# Patient Record
Sex: Male | Born: 1960 | Race: Black or African American | Hispanic: No | Smoking: Never smoker
Health system: Southern US, Community
[De-identification: ages and names within clinical notes are randomized; demographics above are authoritative.]

## PROBLEM LIST (undated history)

## (undated) DIAGNOSIS — R351 Nocturia: Secondary | ICD-10-CM

## (undated) DIAGNOSIS — N183 Chronic kidney disease, stage 3 unspecified: Secondary | ICD-10-CM

## (undated) DIAGNOSIS — Z8719 Personal history of other diseases of the digestive system: Secondary | ICD-10-CM

## (undated) DIAGNOSIS — M1990 Unspecified osteoarthritis, unspecified site: Secondary | ICD-10-CM

## (undated) DIAGNOSIS — M109 Gout, unspecified: Secondary | ICD-10-CM

## (undated) DIAGNOSIS — G4733 Obstructive sleep apnea (adult) (pediatric): Secondary | ICD-10-CM

## (undated) DIAGNOSIS — Z973 Presence of spectacles and contact lenses: Secondary | ICD-10-CM

## (undated) DIAGNOSIS — C801 Malignant (primary) neoplasm, unspecified: Secondary | ICD-10-CM

## (undated) DIAGNOSIS — E785 Hyperlipidemia, unspecified: Secondary | ICD-10-CM

## (undated) DIAGNOSIS — R3129 Other microscopic hematuria: Secondary | ICD-10-CM

## (undated) DIAGNOSIS — R972 Elevated prostate specific antigen [PSA]: Secondary | ICD-10-CM

## (undated) DIAGNOSIS — Z992 Dependence on renal dialysis: Secondary | ICD-10-CM

## (undated) DIAGNOSIS — I471 Supraventricular tachycardia, unspecified: Secondary | ICD-10-CM

## (undated) DIAGNOSIS — D649 Anemia, unspecified: Secondary | ICD-10-CM

## (undated) DIAGNOSIS — N400 Enlarged prostate without lower urinary tract symptoms: Secondary | ICD-10-CM

## (undated) DIAGNOSIS — R7303 Prediabetes: Secondary | ICD-10-CM

## (undated) DIAGNOSIS — E119 Type 2 diabetes mellitus without complications: Secondary | ICD-10-CM

## (undated) DIAGNOSIS — I10 Essential (primary) hypertension: Secondary | ICD-10-CM

## (undated) HISTORY — DX: Essential (primary) hypertension: I10

## (undated) HISTORY — PX: PROSTATE SURGERY: SHX751

## (undated) HISTORY — DX: Hyperlipidemia, unspecified: E78.5

## (undated) HISTORY — PX: OTHER SURGICAL HISTORY: SHX169

## (undated) HISTORY — PX: CARDIAC CATHETERIZATION: SHX172

---

## 1997-11-10 ENCOUNTER — Other Ambulatory Visit: Admission: RE | Admit: 1997-11-10 | Discharge: 1997-11-10 | Payer: Self-pay | Admitting: Orthopedic Surgery

## 2000-08-14 ENCOUNTER — Other Ambulatory Visit: Admission: RE | Admit: 2000-08-14 | Discharge: 2000-08-14 | Payer: Self-pay | Admitting: Obstetrics and Gynecology

## 2001-05-20 ENCOUNTER — Emergency Department (HOSPITAL_COMMUNITY): Admission: EM | Admit: 2001-05-20 | Discharge: 2001-05-20 | Payer: Self-pay | Admitting: *Deleted

## 2001-08-20 ENCOUNTER — Ambulatory Visit (HOSPITAL_COMMUNITY): Admission: RE | Admit: 2001-08-20 | Discharge: 2001-08-20 | Payer: Self-pay | Admitting: Gastroenterology

## 2002-03-04 ENCOUNTER — Encounter (INDEPENDENT_AMBULATORY_CARE_PROVIDER_SITE_OTHER): Payer: Self-pay | Admitting: *Deleted

## 2002-03-04 ENCOUNTER — Ambulatory Visit (HOSPITAL_COMMUNITY): Admission: RE | Admit: 2002-03-04 | Discharge: 2002-03-04 | Payer: Self-pay | Admitting: Gastroenterology

## 2002-04-04 ENCOUNTER — Encounter: Payer: Self-pay | Admitting: Emergency Medicine

## 2002-04-04 ENCOUNTER — Inpatient Hospital Stay (HOSPITAL_COMMUNITY): Admission: EM | Admit: 2002-04-04 | Discharge: 2002-04-08 | Payer: Self-pay | Admitting: Emergency Medicine

## 2002-08-11 ENCOUNTER — Encounter: Payer: Self-pay | Admitting: Internal Medicine

## 2002-08-11 ENCOUNTER — Observation Stay (HOSPITAL_COMMUNITY): Admission: EM | Admit: 2002-08-11 | Discharge: 2002-08-12 | Payer: Self-pay | Admitting: Emergency Medicine

## 2002-08-15 ENCOUNTER — Emergency Department (HOSPITAL_COMMUNITY): Admission: EM | Admit: 2002-08-15 | Discharge: 2002-08-15 | Payer: Self-pay | Admitting: Emergency Medicine

## 2002-08-16 ENCOUNTER — Encounter: Payer: Self-pay | Admitting: Emergency Medicine

## 2002-08-25 ENCOUNTER — Emergency Department (HOSPITAL_COMMUNITY): Admission: EM | Admit: 2002-08-25 | Discharge: 2002-08-26 | Payer: Self-pay | Admitting: Emergency Medicine

## 2003-11-15 ENCOUNTER — Emergency Department (HOSPITAL_COMMUNITY): Admission: EM | Admit: 2003-11-15 | Discharge: 2003-11-15 | Payer: Self-pay | Admitting: Emergency Medicine

## 2003-11-17 ENCOUNTER — Emergency Department (HOSPITAL_COMMUNITY): Admission: EM | Admit: 2003-11-17 | Discharge: 2003-11-17 | Payer: Self-pay | Admitting: *Deleted

## 2004-02-03 ENCOUNTER — Emergency Department (HOSPITAL_COMMUNITY): Admission: EM | Admit: 2004-02-03 | Discharge: 2004-02-04 | Payer: Self-pay | Admitting: Emergency Medicine

## 2004-09-28 ENCOUNTER — Emergency Department (HOSPITAL_COMMUNITY): Admission: EM | Admit: 2004-09-28 | Discharge: 2004-09-28 | Payer: Self-pay | Admitting: Emergency Medicine

## 2004-09-30 ENCOUNTER — Inpatient Hospital Stay (HOSPITAL_COMMUNITY): Admission: EM | Admit: 2004-09-30 | Discharge: 2004-10-05 | Payer: Self-pay | Admitting: Emergency Medicine

## 2005-10-20 ENCOUNTER — Emergency Department (HOSPITAL_COMMUNITY): Admission: EM | Admit: 2005-10-20 | Discharge: 2005-10-20 | Payer: Self-pay | Admitting: Emergency Medicine

## 2006-01-19 ENCOUNTER — Emergency Department (HOSPITAL_COMMUNITY): Admission: EM | Admit: 2006-01-19 | Discharge: 2006-01-19 | Payer: Self-pay | Admitting: Emergency Medicine

## 2006-07-03 ENCOUNTER — Encounter: Payer: Self-pay | Admitting: *Deleted

## 2006-07-03 HISTORY — PX: TRANSTHORACIC ECHOCARDIOGRAM: SHX275

## 2006-09-17 ENCOUNTER — Inpatient Hospital Stay (HOSPITAL_COMMUNITY): Admission: EM | Admit: 2006-09-17 | Discharge: 2006-09-19 | Payer: Self-pay | Admitting: Emergency Medicine

## 2006-12-13 ENCOUNTER — Emergency Department (HOSPITAL_COMMUNITY): Admission: EM | Admit: 2006-12-13 | Discharge: 2006-12-13 | Payer: Self-pay | Admitting: Emergency Medicine

## 2007-10-29 ENCOUNTER — Emergency Department (HOSPITAL_COMMUNITY): Admission: EM | Admit: 2007-10-29 | Discharge: 2007-10-29 | Payer: Self-pay | Admitting: Emergency Medicine

## 2008-07-01 ENCOUNTER — Emergency Department (HOSPITAL_COMMUNITY): Admission: EM | Admit: 2008-07-01 | Discharge: 2008-07-01 | Payer: Self-pay | Admitting: Emergency Medicine

## 2010-05-10 ENCOUNTER — Ambulatory Visit: Payer: Self-pay | Admitting: Cardiology

## 2010-08-17 ENCOUNTER — Other Ambulatory Visit: Payer: Self-pay

## 2010-08-18 ENCOUNTER — Other Ambulatory Visit (INDEPENDENT_AMBULATORY_CARE_PROVIDER_SITE_OTHER): Payer: 59

## 2010-08-18 DIAGNOSIS — E7800 Pure hypercholesterolemia, unspecified: Secondary | ICD-10-CM

## 2010-08-18 DIAGNOSIS — I10 Essential (primary) hypertension: Secondary | ICD-10-CM

## 2010-11-04 NOTE — Discharge Summary (Signed)
NAME:  [name], [name] NO.:  [id number]   MEDICAL RECORD NO.:  [id number]                   PATIENT TYPE:  INP   LOCATION:                                       FACILITY:  [facility]   PHYSICIAN:  Eden Lathe. Einar Gip, M.D.                  DATE OF BIRTH:  09-16-1960   DATE OF ADMISSION:  08/11/2002  DATE OF DISCHARGE:  08/12/2002                                 DISCHARGE SUMMARY   DISCHARGE DIAGNOSES:  1. Chest pain, negative myocardial infarction.     a. Noncritical coronary artery disease per catheterization of April 07, 2002.  Most likely skeletal wall pain.  2. Hypertension.  3. Hypokalemia, improved.  4. Hyperlipidemia.  5. Gastroesophageal reflux disease.   CONDITION ON DISCHARGE:  Improved.   PROCEDURE:  None.   DISCHARGE MEDICATIONS:  1. Toprol XL 50 mg one daily which is an increased dose.  2. Hydrochlorothiazide 12.5 mg daily which is a new lower dose.  3. Vioxx 25 mg daily.  4. Enteric coated aspirin 325 mg daily.  5. Zocor 80 mg at bedtime as before.  6. Take 20 mEq potassium tonight then resume 10 mEq daily as before.  7. Norvasc 10 mg daily as before.   DISCHARGE INSTRUCTIONS:  1. Activity as tolerated.  2. May return to work August 14, 2002.  If further pain call our office     for out of work note.  3. Low fat, low salt diet.  4. Have blood work done on Thursday of this week.  Wednesday is okay.  5. Follow up with Dr. Einar Gip September 02, 2002 at 12:15 p.m.   HISTORY OF PRESENT ILLNESS:  A 50 year old African American male developed  onset of squeezing chest pain constant pain but variable in intensity.  He  was diaphoretic on August 09, 2002.  The pain lasted throughout the  weekend as well as the diaphoresis.  He saw his primary physician the  morning of February 23 and was referred to [facility] ED.  He denied  shortness of breath.  At the time of the assessment he had right sided chest  pain and later on numbness.   PAST MEDICAL HISTORY:  Positive for cardiac catheterization April 07, 2002  by Dr. Sherald Barge, noncritical coronary disease, normal LV function, left  ventricular hypertrophy with nonspecific ST/T wave changes.  He has systemic  hypertension and gastroesophageal reflux disease, hyperlipidemia, obesity.   FAMILY HISTORY:  Positive for coronary artery disease and CEA.   SOCIAL HISTORY:  Married with seven children, no alcohol, no tobacco, no  illicit drugs.  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   ALLERGIES:  PENICILLIN.   MEDICATIONS:  1. Toprol  XL 25 mg daily.  2. Norvasc 10 mg daily.  3. Aspirin 325 mg daily.  4. Hydrochlorothiazide 25 mg daily.  5. Zocor 80 mg daily.  6. KCl 10 mEq daily.   REVIEW OF SYSTEMS:  See H&P.   PHYSICAL EXAMINATION:  At discharge:  VITALS:  Blood pressure 112/84, pulse 52, respirations 20, temperature 97.1,  room air saturation 98%.  GENERAL:  Alert, oriented African American male.  LUNGS:  Clear.  NECK:  Supple, no JVD.  HEART:  Regular rate and rhythm, without S3.  EXTREMITIES:  Lower extremities - without edema.   LABORATORY DATA:  Hemoglobin on admission 14, hematocrit 43, WBC 4.8,  platelets 197, neutrophils 37, lymphs 41.  This remained stable.  Protime  12.3, INR 0.9 and PTT 31.  Electrolytes - sodium 140, potassium 2.9,  chloride 107, CO2 26, glucose 95, BUN 7, creatinine 1, total bilirubin 0.5,  alkaline phosphatase 69, SGOT 22, SGPT 23.  These remained stable except for  his potassium came to 3.1 and he was given supplementation several times.   Cardiac enzymes:  CK 367, 361 and 201.  MB was 2.0, 1.9, 1.8.  Troponin I of  0.02, 0.02 and 0.03.  The lipid panel was pending.  EKG:  sinus rhythm,  sinus brady with no acute changes.  Chest x-ray:  Results are not in the  chart.   HOSPITAL COURSE:  Mr. Glassco was admitted by Dr. Claiborne Billings on August 11, 2002  for rule out MI.  He had had chest pain over the weekend.  CK-MB's remained  negative.  History of noncritical coronary artery disease.  Felt chest pain  was secondary to musculoskeletal pain.  He was started on Vioxx with  improvement of his symptoms.  He was also hypokalemic and potassium was  replaced and by August 12, 2002 he was stable and was discharged home.   FOLLOW UP:  He will follow up as an outpatient with his primary care as well  as Dr. Einar Gip.     [name]. [place], N.P.                     Eden Lathe. Einar Gip, M.D.    LRI/MEDQ  D:  08/12/2002  T:  08/12/2002  Job:  [id number]   cc:   Harvel Quale, M.D.

## 2010-11-04 NOTE — Discharge Summary (Signed)
   NAME:  [name], [name]                           ACCOUNT NO.:  [id number]   MEDICAL RECORD NO.:  [id number]                   PATIENT TYPE:  INP   LOCATION:  3736                                 FACILITY:  [facility]   PHYSICIAN:  Doree Albee, M.D.             DATE OF BIRTH:  1960-12-20   DATE OF ADMISSION:  04/04/2002  DATE OF DISCHARGE:  04/08/2002                                 DISCHARGE SUMMARY   CONDITION ON DISCHARGE:  Stable.   FINAL DISCHARGE DIAGNOSIS:  Coronary artery disease.   MEDICATIONS AT DISCHARGE:  Unavailable.   HISTORY OF PRESENT ILLNESS:  This 50 year old man was admitted with chest  pain.  Please see initial history and physical examination for initial  evaluation.   HOSPITAL PROGRESS:  The patient responded to nitroglycerin very well and was  seen by [facility], who felt that he did  have ischemic cardiac pain and proceeded to cardiac catheterization.  Cardiac catheterization did show noncritical coronary artery disease and did  not require angioplasty but medical management.  His cholesterol will need  to be aggressively controlled and also his hypertension.  His Norvasc prior  to discharge was increased to 10 mg per day.   On April 08, 2002, he was chest pain free.  He felt better and  hemodynamically stable. He was able to be discharged home and will follow up  with me in the office for further adjustments of his medication as needed.                                                Doree Albee, M.D.    NCG/MEDQ  D:  05/08/2002  T:  05/08/2002  Job:  [id number]

## 2010-11-04 NOTE — Cardiovascular Report (Signed)
NAMEEDBERT, MARASIGAN NO.:  [id number]   MEDICAL RECORD NO.:  [id number]          PATIENT TYPE:  INP   LOCATION:  2018                         FACILITY:  [facility]   PHYSICIAN:  Richard A. Rollene Fare, M.D.DATE OF BIRTH:  1961/05/15   DATE OF PROCEDURE:  10/03/2004  DATE OF DISCHARGE:                              CARDIAC CATHETERIZATION   PROCEDURE:  Retrograde central aortic catheterization, selective coronary  angiography via Judkins technique, left ventriculogram RAO and LAO  projection, abdominal angiogram mid stream PA projection.   The above procedure was done through a 6 [place] short side-arm sheath placed  via modified percutaneous Seldinger technique in the RCFA under 1% Xylocaine  anesthesia after premedication with 5 mg Valium p.o.  He was given 2 mg  Versed and 25 mcg Fentanyl for sedation in the laboratory IV.  He tolerated  the procedure well.  Catheterization was done with 6 French 4 cm taper  preformed Cordis coronary and pigtail catheters.  LV angiogram was done in  the RAO and LAO projection with 25 mL 14 mL per second 20 mL 12 mL per  second with pull back pressure showing no gradient across the aortic valve.  Abdominal aortic angiogram was done above the level of the renal arteries at  25 mL 20 mL per second with visualization to the SFA/profunda junctions  bilaterally.  This demonstrated the smooth abdominal aorta with no aneurysm  or stenosis, no significant tortuosity, single and normal renal arteries  bilaterally, normal celiac and SMA axis, and IMA proximally.  Common  external iliacs were patent as was the SFA profunda junction bilaterally and  the hypogastrics were intact bilaterally and there was good run off.  The  catheter was removed, side-arm sheath was flushed, pending review of the  patient's cineangiograms.  He tolerated the procedure well and was  transferred back to the holding area for sheath removal and pressure for  hemostasis in  stable condition.  Heparin was held on call to the laboratory  and ACT was normal at the end of the procedure.  Preoperative creatinine  was 1.3, potassium 3.2, and he was given oral potassium prior to the  procedure.   PRESSURES:  LV:  150/0; LVEDP 18 mmHg.  CA:  150/100.  There is no gradient across the aortic valve on catheter pull back.   LV angiogram in the RAO and LAO projection showed mild global hypokinesis  with EF approximately 45-50%, angiographic severe LVH was present.  There  were no segmental wall motion abnormalities outside of catheter position.   Fluoroscopy did not reveal any significant coronary, intracoronary, or  valvular calcification.   The main left coronary was normal.   The LAD had segmental irregularities with approximately 30-40% narrowing or  less segmentally after the second diagonal branch at the junction of the  proximal third.  There was another 30% narrowing of the distal third of the  vessel.  It was a large vessel that coursed to the apex and under surface of  the heart.  There was no significant stenosis or spasm.  A large  DX1  bifurcated proximal to SB1 and was essentially normal.  The second diagonal  is normal except for approximately 30% mild irregularity in the proximal  third nonostial.  The moderate size DX2 was single, arose after SB1, and had  no significant stenosis.  The small DX3 was normal from the mid LAD.   The circumflex artery was nondominant of moderate size and gave off a small  bifurcating normal OM1, it was tortuous, it was widely patent and smooth,  gave off two PABG branches and left atrial branch which were normal.  It was  tortuous but no significant stenosis.   The right coronary artery gave off the PDA proximal to the acute margin and  there was 40% tandem segmental irregularity in this area similar to prior  angiography.  There was another 40% segmental narrowing of the proximal-mid  PLA which bifurcated, both were  moderately large vessels with no significant  stenosis.   DISCUSSION:  Trell is a 50 year old African American non-smoker who works  three jobs.  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  He has seven children and two  grandchildren.  He has had a history of chronic chest pain and systemic  hypertension with normal renal arteries.  He was last studied October 2003  for chest pain syndrome and had noncritical coronary disease.  His  angiography has changed very little since that time.  He has non-critical  coronary disease as outlined above and I recommend medical therapy and  continued therapy of the systemic hypertension.  The etiology of the chest  pain is not clear but may represent upper GI disease.  He also has evidence  of mild hypertensive cardiomyopathy with EF approximately 50% without  segmental wall motion abnormalities which will need to be followed and  addressed with optimizing his medical therapy.  He was also hypokalemic on  admission.  He has had hypokalemia with myopathy with admission in the past  in 2004 on Dyazide diuretics and I think he probably needs to be changed to  thiazide sparing diuretic if this is possible and/or potassium supplement.   CATHETERIZATION DIAGNOSIS:  1. Chest pain, etiology not determined.  2. Possible esophageal disease, gastroesophageal reflux disease, possible      esophageal spasm.  3. Hypokalemia, recurrent, on diuretic therapy.  4. 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  5. Systemic hypertension, hypertensive heart disease, mild hypertensive      cardiomyopathy with EF approximately 60%.  6. Severe exogenous obesity.  7. Hyperlipidemia.  8. Noncritical coronary disease, minor disease as outlined above.  9. Nonsmoker.        RAW/MEDQ  D:  10/03/2004  T:  10/03/2004  Job:  [id number]   cc:   Eden Lathe. Einar Gip, [place] [address], [address]  [place] [address]  Fax: [phone]

## 2010-11-04 NOTE — Cardiovascular Report (Signed)
NAME:  [name], [name]                           ACCOUNT NO.:  [id number]   MEDICAL RECORD NO.:  [id number]                   PATIENT TYPE:  INP   LOCATION:  3736                                 FACILITY:  [facility]   PHYSICIAN:  Richard A. Rollene Fare, M.D.          DATE OF BIRTH:  1961-02-14   DATE OF PROCEDURE:  04/07/2002  DATE OF DISCHARGE:                              CARDIAC CATHETERIZATION   PROCEDURES:  Retrograde central aortic catheterization, selective coronary  angiography by Judkins technique, left ventricular angiogram by RAO and LAO  projections, intracoronary nitroglycerin administration, abdominal  angiogram, hand projection.   DESCRIPTION OF PROCEDURE:  The patient was brought to the second floor CP  Lab in the postabsorptive state after premedication with 5 mg of Valium p.o.  premedication.  Heparin was on hold, Integrilin was continued as was IV  nitroglycerin.  The right groin was prepped, draped in the usual manner.  Then 1% Xylocaine was used for local anesthesia.  The patient was given 2 mg  of Versed for sedation. The RFA was entered with a single anterior puncture  using an 18 thin wall needle. A 6 [place] Daig side-arm sheath was inserted  without difficulty.  Catheterization was done with 6 French 4 cm taper  preformed coronary and pigtail catheters.  IC nitroglycerin 0.2 mg was given  into the right coronary artery with repeat injections obtained.  LV  angiogram was done in the RAO and LAO projection at 25 cc 14 cc/sec. at 20  cc 12 cc/sec., respectively.  Pullback pressure of the CA was performed,  showed no gradient across the aortic valve.   The catheter was removed.  Side-arm sheath was flushed.  ACT was measured  139 seconds. The patient was brought to the holding area for sheath removal  and pressure hemostasis in stable condition.   PRESSURES:  LV:  170-180/0 mmHg.  Post IC nitroglycerin administration, LV  pressure dropped to 140/0; LVEDP 18 mmHg.   CA:  140/80 mmHg.   There was no gradient across the aortic valve on pullback.  Fluoroscopy did  not reveal any significant coronary, intracardiac or valvular calcification.   LV angiogram in the RAO and LAO projection during sinus beat showed  vigorously and normally contracting ventricle with no segmental wall motion  abnormality.  Angiographic LVH was present.  There was no significant mitral  regurgitation and EF approximately 60%.   The main left coronary was normal.   The left anterior descending artery was a large, widely patent vessel that  coursed to the apex and undersurface of the heart.  There was 20-30% mild  eccentric smooth narrowing of the LAD between the large first and moderate  sized second diagonal branch.  There was some irregularity of the LAD in the  midportion but no significant stenosis.  There was good normal flow  throughout the LAD.   The first diagonal branch was a widely  patent large bifurcating branch that  arose before the first septal perforator and trifurcated distally and was  normal.   The second diagonal branch rose after the septal perforator at the junction  of the proximal third of the LAD and was normal.   There was a moderate sized optional diagonal branch that was normal.   The circumflex was nondominant, comprised of a single circumflex proper and  a small marginal branch that were normal.  There was a large atrial branch  that was normal.   His right coronary was a dominant vessel.  The PLA rose from the junction of  the proximal third of the RCA and bifurcated distally.  There was 50%  narrowing in the PLA branch, midportion.   There was 40% narrowing of the right coronary beyond the PLA in the  midportion and before the acute margin.  The PDA was moderate sized and was  normal.   DISCUSSION:  This 50 year old [place] American male, married father of eight  is a non smoker.  He has hypertension, hyperlipidemia and was admitted  to  the hospital with various forms of chest pain, some musculoskeletal, some  atypical and some substernal chest discomfort.  Hypokalemia was treated with  oral potassium replacement and the patient had transient diffuse ST-T wave  abnormalities. It view of his ST-T wave changes despite hypertension, LVH  and hypokalemia with his history of chest pain, and hyperlipidemia, it was  felt best to pursue diagnostic angiography as outlined above.   Fortunately the patient has noncritical coronary disease.  He has had  elevated CPKs, hypokalemia and alkalosis compatible with total body K  depletion and this may account for his elevated CPKs with associated  hypokalemic myopathy. His ST-T wave changes may be related to his  electrolyte imbalance as well on admission.  He has noncritical coronary  disease with 50% mid PLA, 40% mid right, 20-30% smooth eccentric LAD between  the first two diagonals with a otherwise large and tortuous coronary  anatomy.   I would recommend medical therapy of his mild coronary artery disease along  with lipid-lowering therapy, weight reduction exercise program and  associated therapy of his hypertension; that may include ACE or A or B,  particularly in view of his elevated blood sugars in the hospital and  possible metabolic syndrome.  He may be a candidate for ARB or ACE inhibitor  therapy of his hypertension along with potassium and possibly magnesium  supplement if he is to be kept on chronic diuretics.  He will probably need  relatively long-term potassium supplement to replenish total body K source  in view of his lab in the hospital. Empiric GI therapy may be helpful as  well.   CATHETERIZATION DIAGNOSES:  1. __________ etiology not determined.  2. Noncritical coronary disease.  3. Normal left ventricular function, left ventricular hypertrophy, normal     renal arteries, single bilaterally.  4. Systemic hypertension. 5. Hypokalemia.  6. 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  7. Gastroesophageal reflux disease.  8. Hyperlipidemia.  9. Non smoker.  10.      Exogenous obesity.  11.      Probably gastroesophageal reflux disease.  12.      ST-T wave abnormality probably related to left ventricular     hypertrophy and electrolyte imbalance.  Richard A. Rollene Fare, M.D.    RAW/MEDQ  D:  04/07/2002  T:  04/07/2002  Job:  [id number]   cc:   Eden Lathe. Einar Gip, M.D.   Doree Albee, M.D.   [facility] Laboratory

## 2010-11-04 NOTE — Consult Note (Signed)
[name], LANCLOS NO.:  [id number]   MEDICAL RECORD NO.:  [id number]          PATIENT TYPE:  INP   LOCATION:  5501                         FACILITY:  [facility]   PHYSICIAN:  Mayme Genta, M.D.DATE OF BIRTH:  June 20, 1960   DATE OF CONSULTATION:  DATE OF DISCHARGE:                                 CONSULTATION   GASTROENTEROLOGY CONSULTATION NOTE:   REASON FOR CONSULTATION:  Rectal bleeding.   HISTORY OF PRESENT ILLNESS:  Mr. 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  He developed the acute onset of large-volume  rectal bleeding with bright red blood per rectum immediately following a  bowel movement yesterday.  A second episode occurred immediately  thereafter.  There has been no further rectal bleeding.  The patient  came to the emergency room, where he was Hemoccult positive.  His  hemogram was normal with a hemoglobin of 15.0 and hematocrit 44, which  has remained relatively stable over the past 24 hours.  He was found to  be Hemoccult positive.  He was admitted for further observation and  evaluation.   The patient denies any perianal or perirectal pain.  No prior history of  coagulopathy.  Bowel habits are regular, denies constipation.   Evaluation for similar symptoms, May 2007 with colonoscopy revealed two  small rectal hyperplastic polyps.  A colonoscopy prior to that, 2003,  with adenomatous polyps.   The patient has been maintained on clear liquids.  His vital signs have  remained stable.  He has not developed any other significant symptoms.   PAST MEDICAL HISTORY:  1. GERD.  2. Hypertension with hypertensive cardiomyopathy.  3. CAD - noncritical, status post catheterization, 2003 and 2006.  4. Obesity.  5. Hyperlipidemia.   CURRENT MEDICAL REGIMEN:  HCTZ, [place].   ALLERGIES:  PENICILLIN, ZOCOR - myalgias.   REVIEW OF SYSTEMS:  Noncontributory.   SOCIAL:  Married.  No history of tobacco or alcohol use.  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   FAMILY HISTORY:  Father deceased 92 - Laennec's cirrhosis.  Mother -  hypertension.  Killed by a gunshot.   PHYSICAL EXAMINATION:  GENERAL:  Healthy, alert, oriented, middle-aged  African-American male comfortable, conversational, lying in bed, in no  acute distress.  VITAL SIGNS:  Stable.  HEENT:  Anicteric sclerae.  Pink conjunctivae.  No pallor.  No  oropharyngeal lesion with normal lips, gums, tongue.  NECK:  Supple, no adenopathy, no thyromegaly.  CHEST:  Clear to auscultation.  No adventitious sounds.  CARDIAC:  Regular rhythm.  No gallop, no murmur.  ABDOMEN:  Obese, no palpable organomegaly.  Soft, nontender,  nondistended.  Bowel sounds are active.  No __________ bruit or splash.  RECTAL:  Not performed at this time.  LOWER EXTREMITY:  No clubbing, cyanosis, or edema.  NEUROLOGIC:  Nonfocal.   LABORATORY:  Normal CBC, CMET except for a low potassium of 3.0.  Hemoccult positive stool.   ASSESSMENT:  1. Lower GI  bleeding - suspect the patient has developed an anorectal      fissure.  Colorectal neoplasia would be unlikely given his      colonoscopy just shy of 1 year ago.  He did have internal      hemorrhoids, but these were small.  There was no diverticulosis      noted.  Although, reevaluation with colonoscopy is arguable, The      patient is extremely anxious about these findings.  Given the drop      in hematocrit, extensive rectal bleeding, colonoscopy will be      repeated to be sure there have been no lesions overlooked into      trying to conclusively diagnose the source of his bleeding.  2. Gastroesophageal reflux disease - minimally symptomatic.  3. Obesity - multiple comorbid conditions including hypertension,      hyperlipidemia, coronary artery disease.   RECOMMENDATIONS:  1. Maintain clear liquids.  2. Prep for  colonoscopy today, and I will perform colonoscopy      tomorrow.  3. Continue to monitor hemogram and stools.      Mayme Genta, M.D.  Electronically Signed     JRM/MEDQ  D:  09/17/2006  T:  09/17/2006  Job:  [id number]   cc:   Quentin Cornwall

## 2010-11-04 NOTE — Discharge Summary (Signed)
NAMEDARRIC, TULLAR NO.:  [id number]   MEDICAL RECORD NO.:  [id number]            PATIENT TYPE:   LOCATION:                                 FACILITY:   PHYSICIAN:  Ulice Dash R. Einar Gip, MD            DATE OF BIRTH:   DATE OF ADMISSION:  09/29/2004  DATE OF DISCHARGE:  10/05/2004                                 DISCHARGE SUMMARY   ADMISSION DIAGNOSES:  1.  Chest pain.  2.  Hypertension.  3.  Hyperlipidemia.  4.  Obesity.   DISCHARGE DIAGNOSES:  1.  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  2.  Cardiac catheterization shows noncritical disease with an ejection      fraction of 50-55%.  3.  Hyperlipidemia.  4.  Obesity with body mass index of 38.8.  5.  The patient works two full-time jobs with eight children to support.   PROCEDURES:  Cardiac catheterization, Richard A. Rollene Fare, M.D., October 03, 2004.   BRIEF HISTORY:  The patient is a 50 year old black male who presented with  complaints of headache and was found to have an elevated blood pressure of  200/100.  He was placed on medical management two days prior.  He returned  at this time to the [facility] ER with chest pain.  He was working and had  sudden onset in the mid- and left chest.  He was seen in the ER and  subsequently admitted for treatment.   PAST MEDICAL HISTORY:  1.  Cardiac catheterization April 07, 2002, with noncritical coronary      artery disease with normal LV.  Hospitalized August 11, 2002, for      chest pain.  2.  History of LVH.  3.  Hypertension.  4.  GERD.  5.  Obesity.  6.  Hyperlipidemia.   MEDICATIONS ON ADMISSION:  1.  Toprol XL 50 mg daily.  2.  Hyzaar 100 mg daily.  3.  Zocor 40 mg daily.  4.  Aspirin one daily.   PHYSICAL EXAMINATION:  VITAL SIGNS:  His blood pressure on admission was  212/142.  HEENT:  Within normal limits.  NECK:  No bruits, no JVD, no thyromegaly.  He has increased girth.  CARDIAC:  Regular rate and rhythm.  Questionable soft  systolic murmur.  ABDOMEN:  Obese.  Positive bowel sounds.  EXTREMITIES:  No edema, with +2 pulses.   HOSPITAL COURSE:  The patient was admitted.  He was managed medically.  As  his blood pressure came down, his symptoms improved.  He stabilized over the  weekend.  His headache continued.  He also had developed some  conjunctivitis, which was treated with eye drops.  He had some mild  hypokalemia.  This was replaced.  He also underwent repeat cardiac  catheterization on October 03, 2004.  This showed an EF of 45-50% with severe  LVH.  His RCA was patent.  There was 40% stenosis in the PD and the PLA.  The LAD was patent.  There was a 30% midstenosis but no  other significant  disease is listed.  The patient tolerated the procedure well with the  resolution of his chest pain and increased medication.  It was thought that  this patient's chest pain was related to directly to his hypertension.  His  medications were adjusted and by __________ it was Dr. Irven Shelling opinion the  patient could be discharged home.   He was discharged home on:  1.  Altace 10 mg daily.  2.  Aspirin 81 mg daily.  3.  Cozaar 100 mg daily.  4.  Maxzide 75/50 mg one daily.  5.  Norvasc 10 mg daily.  6.  Toprol XL 100 mg daily.  7.  Zocor 40 mg daily.  8.  Pananol ophthalmic drops a.c. daily.  9.  Protonix 40 mg one in the a.m. and one in the p.m.   DISCHARGE ACTIVITY:  Light to moderate, no strenuous activities.  Return to  work on Monday, October 11, 2004.   Maintain a low-fat, low-salt diet.  Work on his weight reduction.  Take his  blood pressure twice a day.  Keep a record.  If his diastolic pressure is  more than 80, he is to call.  He is scheduled to follow up with Dr. Einar Gip in  two weeks with our office making the arrangements.   LABORATORY DATA:  On discharge, white count was 5.7, hemoglobin was 14.2,  hematocrit was 43.5, platelets were 230,000.  Sodium was 138, potassium was  3.5, chloride 24, CO2 is 25,  glucose is 106, BUN is 16, creatinine is 1.4,  calcium was 9.3.  LFTs were normal.  Magnesium was 2.1.  CKs and troponins  were negative.  UA was negative.      Lydia Guiles, P.A.      Eden Lathe. Einar Gip, MD  Electronically Signed    WDJ/MEDQ  D:  04/27/2005  T:  04/28/2005  Job:  [id number]

## 2010-11-04 NOTE — H&P (Signed)
NAMETALMAGE, HORRY NO.:  [id number]   MEDICAL RECORD NO.:  [id number]          PATIENT TYPE:  INP   LOCATION:  1825                         FACILITY:  [place]   PHYSICIAN:  Sherryl Manges, M.D.  DATE OF BIRTH:  1961/05/14   DATE OF ADMISSION:  09/16/2006  DATE OF DISCHARGE:                              HISTORY & PHYSICAL   PMD:  Previously Dr. Anastasio Champion, now unassigned.   CHIEF COMPLAINT:  Passage of blood per rectum.   HISTORY OF PRESENT ILLNESS:  This is a 50 year old male.  For past  medical history, see below.  According to the patient, about 9 p.m. on  September 16, 2006, he went to the toilet and passed blood mixed with stool.  Then just as he was about to get up, he felt like moving his bowels  again, and this time passed only blood, became alarmed and came to the  emergency department.  He describes the blood as bright red.  There was  no associated abdominal pain or vomiting.  He has not had similar  symptoms in the recent past.  However, approximately one and half years  ago, he noticed a spot of blood in the stools, but none since.   PAST MEDICAL HISTORY:  1. Hypertension.  2. Hypertensive heart disease/mild hypertensive cardiomyopathy,      ejection fraction 60%.  3. Status post cardiac catheterization for chest pain October 2003 and      April 2006.  These showed noncritical CAD (cardiologist Dr.      Rollene Fare and Dr. Christen Butter).  4. GERD.  5. Obesity.  6. Dyslipidemia.  7. Status post colonoscopy September 2003 by Dr. Earlean Shawl.  This      revealed polyp which was extracted.  Pathology showed adenomatous      polyp.   MEDICATION HISTORY:  1. Hydrochlorothiazide 25 mg p.o. daily.  2. Azor (amlodipine/olmesartan) 5/20 one p.o. daily.   ALLERGIES:  1. PENICILLIN.  2. ZOCOR, this causes myalgia.   REVIEW OF SYSTEMS:  Essentially as per HPI and chief complaint,  otherwise negative.   SOCIAL HISTORY:  The patient is married, nonsmoker,  nondrinker.  Has a  total of 11 offspring.  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   FAMILY HISTORY:  The patient's father died at age 54 years from hepatic  cirrhosis.  He was an alcoholic.  His mother had hypertension but was  killed at age 15 years from a gunshot wound.   PHYSICAL EXAMINATION:  VITALS:  Temperature 97.3, pulse 81 per minute  and regular, respiratory rate 24, BP 129/84 mmHg, pulse oximeter 95% on  room air.  The patient does not appear to be in obvious acute  discomfort.  Alert, communicative, not short of breath at rest.  HEENT:  No clinical pallor, no jaundice, no conjunctival injection.  NECK:  Supple.  JVP not seen.  No palpable lymphadenopathy.  No palpable  goiter.  CHEST:  Clinically clear to auscultation.  No wheezes or crackles.  HEART:  Sounds  1-2 heard normal, regular, no murmurs.  ABDOMEN:  Moderately obese, soft and nontender.  No palpable  organomegaly.  No palpable masses.  Normal bowel sounds.  LOWER EXTREMITY:  Examination showed no pitting edema.  Palpable  peripheral pulses.  MUSCULOSKELETAL:  Examination was quite unremarkable.  CENTRAL NERVOUS SYSTEM:  No focal neurologic deficit on gross  examination.   INVESTIGATIONS:  CBC:  WBC 5.4, hemoglobin 13.3, hematocrit 41.1,  platelets 250.  Electrolytes:  Sodium 140, potassium 3.0, chloride 106,  CO2 27.5, BUN 11, creatinine 1.5, glucose 95.  Fecal occult blood test  was positive.   ASSESSMENT AND PLAN:  1. Lower gastrointestinal bleed:  Etiologies include recurrent polyps,      diverticular disease, or sinister pathology.  The patient's last      colonoscopy was approximately 4 years ago.  He will therefore need      repeat evaluation.  We shall request gastroenterology consultation      in the a.m. of September 17, 2006 as the patient is currently      hemodynamically stable.  Dr. Earlean Shawl did his colonoscopy in the      past.  We shall therefore  consult Dr. Lil[facility].  Meanwhile,      we shall do serial hemoglobin/hematocrit, place the patient on      clear fluids by mouth, continue maintenance intravenous fluids and      monitor closely.   1. Hypertension:  This appears controlled.  We shall continue pre-      admission medications.   1. History of gastroesophageal reflux disease:  This is asymptomatic.      We shall place the patient on prophylactic proton pump inhibitor.   1. Dyslipidemia:  The patient is off Statins, because of intolerance      secondary to myalgia.  We shall check TSH and lipid profile.   Further management will depend on clinical course.      Sherryl Manges, M.D.  Electronically Signed     CO/MEDQ  D:  09/17/2006  T:  09/17/2006  Job:  [id number]   cc:   Mayme Genta, M.D.  Richard A. Rollene Fare, M.D.  Eden Lathe. Einar Gip, MD

## 2011-03-15 LAB — POCT CARDIAC MARKERS
CKMB, poc: 2.1
CKMB, poc: 2.1
Myoglobin, poc: 156
Myoglobin, poc: 166
Operator id: 257131
Troponin i, poc: <0.05
Troponin i, poc: <0.05

## 2011-03-15 LAB — DIFFERENTIAL
Lymphs Abs: 2.1
Neutrophils Relative %: 50

## 2011-03-15 LAB — POCT I-STAT, CHEM 8
BUN: 14
Calcium, Ion: 1.11 — ABNORMAL LOW
Creatinine, Ser: 1.7 — ABNORMAL HIGH
Glucose, Bld: 111 — ABNORMAL HIGH
HCT: 44
Hemoglobin: 15
Potassium: 3 — ABNORMAL LOW
TCO2: 27

## 2011-03-15 LAB — D-DIMER, QUANTITATIVE: D-Dimer, Quant: 0.39

## 2011-03-15 LAB — CBC
RDW: 17.3 — ABNORMAL HIGH
WBC: 6

## 2011-04-05 LAB — POCT CARDIAC MARKERS: Myoglobin, poc: 107

## 2011-04-05 LAB — DIFFERENTIAL
Basophils Absolute: 0
Monocytes Absolute: 0.6
Monocytes Relative: 11
Neutrophils Relative %: 50

## 2011-04-05 LAB — CBC
Hemoglobin: 13.6
MCHC: 32.4
MCV: 79.9
RBC: 5.24
RDW: 16.9 — ABNORMAL HIGH

## 2011-04-05 LAB — I-STAT 8, (EC8 V) (CONVERTED LAB)
Bicarbonate: 27.4 — ABNORMAL HIGH
pCO2, Ven: 42.7 — ABNORMAL LOW

## 2011-04-05 LAB — POCT I-STAT CREATININE: Operator id: 285841

## 2011-04-17 ENCOUNTER — Emergency Department (HOSPITAL_COMMUNITY)
Admission: EM | Admit: 2011-04-17 | Discharge: 2011-04-17 | Disposition: A | Discharge status: Home / Self Care | Payer: 59 | Attending: Emergency Medicine | Admitting: Emergency Medicine

## 2011-04-17 DIAGNOSIS — L02219 Cutaneous abscess of trunk, unspecified: Secondary | ICD-10-CM | POA: Insufficient documentation

## 2011-04-17 DIAGNOSIS — I10 Essential (primary) hypertension: Secondary | ICD-10-CM | POA: Insufficient documentation

## 2011-04-17 DIAGNOSIS — M545 Low back pain, unspecified: Secondary | ICD-10-CM | POA: Insufficient documentation

## 2011-04-17 DIAGNOSIS — Z79899 Other long term (current) drug therapy: Secondary | ICD-10-CM | POA: Insufficient documentation

## 2011-09-07 ENCOUNTER — Encounter: Payer: Self-pay | Admitting: *Deleted

## 2011-10-18 ENCOUNTER — Emergency Department (HOSPITAL_COMMUNITY)
Admission: EM | Admit: 2011-10-18 | Discharge: 2011-10-18 | Disposition: A | Discharge status: Home / Self Care | Payer: 59 | Attending: Emergency Medicine | Admitting: Emergency Medicine

## 2011-10-18 ENCOUNTER — Emergency Department (HOSPITAL_COMMUNITY): Payer: 59

## 2011-10-18 ENCOUNTER — Encounter (HOSPITAL_COMMUNITY): Payer: Self-pay | Admitting: Emergency Medicine

## 2011-10-18 DIAGNOSIS — I10 Essential (primary) hypertension: Secondary | ICD-10-CM | POA: Insufficient documentation

## 2011-10-18 DIAGNOSIS — M19022 Primary osteoarthritis, left elbow: Secondary | ICD-10-CM

## 2011-10-18 DIAGNOSIS — E785 Hyperlipidemia, unspecified: Secondary | ICD-10-CM | POA: Insufficient documentation

## 2011-10-18 DIAGNOSIS — I2510 Atherosclerotic heart disease of native coronary artery without angina pectoris: Secondary | ICD-10-CM | POA: Insufficient documentation

## 2011-10-18 DIAGNOSIS — M19029 Primary osteoarthritis, unspecified elbow: Secondary | ICD-10-CM | POA: Insufficient documentation

## 2011-10-18 DIAGNOSIS — M25529 Pain in unspecified elbow: Secondary | ICD-10-CM | POA: Insufficient documentation

## 2011-10-18 MED ORDER — OXYCODONE-ACETAMINOPHEN 5-325 MG PO TABS
1.0000 | ORAL_TABLET | Freq: Once | ORAL | Status: AC
  Administered 2011-10-18: 1 via ORAL
  Filled 2011-10-18: qty 1

## 2011-10-18 MED ORDER — NAPROXEN 500 MG PO TABS: 500.0000 mg | ORAL_TABLET | Freq: Two times a day (BID) | ORAL | Status: DC

## 2011-10-18 MED ORDER — HYDROCODONE-ACETAMINOPHEN 5-325 MG PO TABS: 2.0000 | ORAL_TABLET | Freq: Four times a day (QID) | ORAL | Status: AC | PRN

## 2011-10-18 NOTE — Discharge Instructions (Signed)
Degenerative Arthritis You have osteoarthritis. This is the wear and tear arthritis that comes with aging. It is also called degenerative arthritis. This is common in people past middle age. It is caused by stress on the joints. The large weight bearing joints of the lower extremities are most often affected. The knees, hips, back, neck, and hands can become painful, swollen, and stiff. This is the most common type of arthritis. It comes on with age, carrying too much weight, or from an injury. Treatment includes resting the sore joint until the pain and swelling improve. Crutches or a walker may be needed for severe flares. Only take over-the-counter or prescription medicines for pain, discomfort, or fever as directed by your caregiver. Local heat therapy may improve motion. Cortisone shots into the joint are sometimes used to reduce pain and swelling during flares. Osteoarthritis is usually not crippling and progresses slowly. There are things you can do to decrease pain:  Avoid high impact activities.   Exercise regularly.   Low impact exercises such as walking, biking and swimming help to keep the muscles strong and keep normal joint function.   Stretching helps to keep your range of motion.   Lose weight if you are overweight. This reduces joint stress.  In severe cases when you have pain at rest or increasing disability, joint surgery may be helpful. See your caregiver for follow-up treatment as recommended.  SEEK IMMEDIATE MEDICAL CARE IF:   You have severe joint pain.   Marked swelling and redness in your joint develops.   You develop a high fever.  Document Released: 06/05/2005 Document Revised: 05/25/2011 Document Reviewed: 11/05/2006 [facility] Patient Information 2012 [place].  RICE: Routine Care for Injuries The routine care of many injuries includes Rest, Ice, Compression, and Elevation (RICE). HOME CARE INSTRUCTIONS  Rest is needed to allow your body to heal. Routine  activities can usually be resumed when comfortable. Injured tendons and bones can take up to 6 weeks to heal. Tendons are the cord-like structures that attach muscle to bone.   Ice following an injury helps keep the swelling down and reduces pain.   Put ice in a plastic bag.   Place a towel between your skin and the bag.   Leave the ice on for 15 to 20 minutes, 3 to 4 times a day. Do this while awake, for the first 24 to 48 hours. After that, continue as directed by your caregiver.   Compression helps keep swelling down. It also gives support and helps with discomfort. If an elastic bandage has been applied, it should be removed and reapplied every 3 to 4 hours. It should not be applied tightly, but firmly enough to keep swelling down. Watch fingers or toes for swelling, bluish discoloration, coldness, numbness, or excessive pain. If any of these problems occur, remove the bandage and reapply loosely. Contact your caregiver if these problems continue.   Elevation helps reduce swelling and decreases pain. With extremities, such as the arms, hands, legs, and feet, the injured area should be placed near or above the level of the heart, if possible.  SEEK IMMEDIATE MEDICAL CARE IF:  You have persistent pain and swelling.   You develop redness, numbness, or unexpected weakness.   Your symptoms are getting worse rather than improving after several days.  These symptoms may indicate that further evaluation or further X-rays are needed. Sometimes, X-rays may not show a small broken bone (fracture) until 1 week or 10 days later. Make a follow-up appointment  with your caregiver. Ask when your X-ray results will be ready. Make sure you get your X-ray results. Document Released: 09/17/2000 Document Revised: 05/25/2011 Document Reviewed: 11/04/2010 [facility] Patient Information 2012 [place], [place].

## 2011-10-18 NOTE — ED Provider Notes (Signed)
History     CSN: [id number]  Arrival date & time 10/18/11  2027   First MD Initiated Contact with Patient 10/18/11 2246      Chief Complaint  Patient presents with  . Arm Pain    (Consider location/radiation/quality/duration/timing/severity/associated sxs/prior treatment) HPI  51 year-old male w/ hx of HTN, CAD, and hyperlipidemia presents with L arm pain.  Pt sts he woke up this am with intense throbbing pain to his L elbow.  Pain is acute on onset, persistent, radiating down to L forearm and hand, and worsening with movement.  Sts he can't barely pick up object with his L hand due to pain. Pt felt that his L arm is swelling. Also experiencing tingling sensation throughout L arm.  Denies any recent trauma.  Denies fever, chills, cp, sob, diaphoresis, exertional cp, rash.  Pt is a Dealer, is R hand dominant, denies recent excessive usage or L arm.  Denies hx of gout.  Has tried 1 pill of motrin today without relief.  Sts he remembers having similar pain to arm in distant past but never this bad.    Past Medical History  Diagnosis Date  . Hyperlipidemia   . Hypertension   . Coronary artery disease     nonobstructive cad  . Obesity   . Chest pain   . Enlarged prostate     followed by Dr. Serita Butcher    Past Surgical History  Procedure Date  . Cardiac catheterization     Dr. Einar Gip performed cath    Family History  Problem Relation Age of Onset  . Cirrhosis Father   . Other Mother     shot and killed    History  Substance Use Topics  . Smoking status: Never Smoker   . Smokeless tobacco: Not on file  . Alcohol Use: No      Review of Systems  All other systems reviewed and are negative.    Allergies  Penicillins  Home Medications   Current Outpatient Rx  Name Route Sig Dispense Refill  . AMLODIPINE-OLMESARTAN 5-20 MG PO TABS Oral Take 1 tablet by mouth daily.    . ATORVASTATIN CALCIUM 80 MG PO TABS Oral Take 80 mg by mouth daily.    Marland Kitchen FINASTERIDE 5 MG PO  TABS Oral Take 5 mg by mouth daily.    . IBUPROFEN 200 MG PO TABS Oral Take 400 mg by mouth every 6 (six) hours as needed. For pain      BP 143/98  Pulse 99  Temp(Src) 98.2 F (36.8 C) (Oral)  Resp 20  SpO2 97%  Physical Exam  Nursing note and vitals reviewed. Constitutional: He appears well-developed and well-nourished. No distress.       obese  HENT:  Head: Normocephalic and atraumatic.  Eyes: Conjunctivae are normal.  Neck: Neck supple. No JVD present.  Cardiovascular: Normal rate and regular rhythm.   Pulmonary/Chest: Effort normal and breath sounds normal. No respiratory distress. He exhibits no tenderness.  Musculoskeletal: He exhibits tenderness. He exhibits no edema.       Right shoulder: Normal.       Left shoulder: Normal.       Left elbow: He exhibits decreased range of motion. He exhibits no swelling, no effusion, no deformity and no laceration. tenderness found.       Left wrist: He exhibits tenderness. He exhibits normal range of motion, no bony tenderness and no swelling.       Left hand: He exhibits tenderness. He exhibits  normal range of motion, no bony tenderness, normal two-point discrimination, normal capillary refill, no deformity, no laceration and no swelling. normal sensation noted. Decreased strength noted.  Lymphadenopathy:    He has no cervical adenopathy.  Neurological: He is alert.  Skin: Skin is warm. No rash noted.    ED Course  Procedures (including critical care time)  Labs Reviewed - No data to display No results found.   No diagnosis found.   Date: 10/18/2011  Rate: 83  Rhythm: normal sinus rhythm  QRS Axis: normal  Intervals: normal  ST/T Wave abnormalities: normal  Conduction Disutrbances:none  Narrative Interpretation:   Old EKG Reviewed: unchanged  Dg Elbow Complete Left  10/18/2011  *RADIOLOGY REPORT*  Clinical Data: Elbow pain, no injury  LEFT ELBOW - COMPLETE 3+ VIEW  Comparison: None.  Findings: Moderate degenerative  change of the radial-capitellar and ulnar-humeral articulations with articular surface irregularity, subchondral sclerosis and osteophytosis.  No joint effusion.  No fracture.  Regional soft tissues are normal.  IMPRESSION: Moderate degenerative change of the elbow without fracture or joint effusion.  Original Report Authenticated By: Rachel Moulds, M.D.      MDM  Pain to L elbow down to L forearm and hand.  Reproducible on palpation and with movement.  No cp, sob.  Doubt cardio/pulmonary etiology.  Likely musculoskeletal.  Xray ordered.     11:44 PM L elbow xray shows moderate degenerateive changes without fx or joint effusion.  Will treat for arthritis.  F/u instruction given.       Domenic Moras, PA-C 10/18/11 2345

## 2011-10-18 NOTE — ED Notes (Signed)
Patient complaining of left arm pain that started today from elbow that radiates down to hand.  Patient complaining of swelling in elbow and fingers.  Patient denies injury to arm.

## 2011-10-18 NOTE — ED Provider Notes (Signed)
Medical screening examination/treatment/procedure(s) were performed by non-physician practitioner and as supervising physician I was immediately available for consultation/collaboration.  Nat Christen, MD 10/18/11 [phone]

## 2011-10-18 NOTE — ED Notes (Signed)
PT. SLEEPING WITH NO DISTRESS , RESPIRATIONS UNLABORED.

## 2012-07-25 ENCOUNTER — Emergency Department (HOSPITAL_COMMUNITY)
Admission: EM | Admit: 2012-07-25 | Discharge: 2012-07-25 | Disposition: A | Discharge status: Home / Self Care | Payer: 59 | Attending: Emergency Medicine | Admitting: Emergency Medicine

## 2012-07-25 ENCOUNTER — Encounter (HOSPITAL_COMMUNITY): Payer: Self-pay | Admitting: *Deleted

## 2012-07-25 ENCOUNTER — Emergency Department (HOSPITAL_COMMUNITY): Payer: 59

## 2012-07-25 DIAGNOSIS — I2510 Atherosclerotic heart disease of native coronary artery without angina pectoris: Secondary | ICD-10-CM | POA: Insufficient documentation

## 2012-07-25 DIAGNOSIS — E876 Hypokalemia: Secondary | ICD-10-CM | POA: Insufficient documentation

## 2012-07-25 DIAGNOSIS — I10 Essential (primary) hypertension: Secondary | ICD-10-CM | POA: Insufficient documentation

## 2012-07-25 DIAGNOSIS — M791 Myalgia, unspecified site: Secondary | ICD-10-CM

## 2012-07-25 DIAGNOSIS — Z79899 Other long term (current) drug therapy: Secondary | ICD-10-CM | POA: Insufficient documentation

## 2012-07-25 DIAGNOSIS — IMO0001 Reserved for inherently not codable concepts without codable children: Secondary | ICD-10-CM | POA: Insufficient documentation

## 2012-07-25 DIAGNOSIS — E669 Obesity, unspecified: Secondary | ICD-10-CM | POA: Insufficient documentation

## 2012-07-25 DIAGNOSIS — Z87448 Personal history of other diseases of urinary system: Secondary | ICD-10-CM | POA: Insufficient documentation

## 2012-07-25 DIAGNOSIS — M79609 Pain in unspecified limb: Secondary | ICD-10-CM | POA: Insufficient documentation

## 2012-07-25 DIAGNOSIS — E785 Hyperlipidemia, unspecified: Secondary | ICD-10-CM | POA: Insufficient documentation

## 2012-07-25 LAB — BASIC METABOLIC PANEL
CO2: 32 mEq/L (ref 19–32)
Calcium: 9.1 mg/dL (ref 8.4–10.5)
GFR calc Af Amer: 52 mL/min — ABNORMAL LOW (ref >90)
Sodium: 142 mEq/L (ref 135–145)

## 2012-07-25 LAB — CBC WITH DIFFERENTIAL/PLATELET
Basophils Absolute: 0 10*3/uL (ref 0.0–0.1)
Basophils Relative: 0 % (ref 0–1)
Eosinophils Relative: 3 % (ref 0–5)
Lymphocytes Relative: 37 % (ref 12–46)
MCV: 81.4 fL (ref 78.0–100.0)
Neutro Abs: 3 10*3/uL (ref 1.7–7.7)
Platelets: 264 10*3/uL (ref 150–400)
RDW: 15.5 % (ref 11.5–15.5)
WBC: 6 10*3/uL (ref 4.0–10.5)

## 2012-07-25 LAB — URINALYSIS, ROUTINE W REFLEX MICROSCOPIC
Glucose, UA: NEGATIVE mg/dL
Leukocytes, UA: NEGATIVE
Nitrite: NEGATIVE
Protein, ur: 100 mg/dL — AB
Urobilinogen, UA: 1 mg/dL (ref 0.0–1.0)

## 2012-07-25 LAB — URINE MICROSCOPIC-ADD ON

## 2012-07-25 MED ORDER — TRAMADOL HCL 50 MG PO TABS: 50.0000 mg | ORAL_TABLET | Freq: Four times a day (QID) | ORAL | Status: DC | PRN

## 2012-07-25 MED ORDER — POTASSIUM CHLORIDE CRYS ER 20 MEQ PO TBCR
40.0000 meq | EXTENDED_RELEASE_TABLET | Freq: Once | ORAL | Status: AC
  Administered 2012-07-25: 40 meq via ORAL
  Filled 2012-07-25: qty 2

## 2012-07-25 MED ORDER — POTASSIUM CHLORIDE ER 10 MEQ PO TBCR: 20.0000 meq | EXTENDED_RELEASE_TABLET | Freq: Two times a day (BID) | ORAL | Status: DC

## 2012-07-25 NOTE — ED Notes (Signed)
Patient c/o right arm and leg numbness x 4 days, patient states decreased strength and cramping type pain

## 2012-07-25 NOTE — ED Provider Notes (Signed)
History     CSN: [id number]  Arrival date & time 07/25/12  0304   First MD Initiated Contact with Patient 07/25/12 [phone]      Chief Complaint  Patient presents with  . Numbness    right arm/leg    (Consider location/radiation/quality/duration/timing/severity/associated sxs/prior treatment) HPI 52 yo male presents to the ER with complaint of 4 days of right arm and leg heaviness and pain.  Pain increased tonight prompting coming to the ER.  No weakness, no facial droop, no difficulties with speech, eating, or other ADL's.  No prior h/o same.  No trauma to the area, no new activities.  Pt indicates pain at right upper arm/elbow and right hip/thigh.  Pain with palpation of the areas and with movement.  Pain is described as heavy prickling feeling.  Pt recently switched from hctz to norvasc and hygroton.  No chest pain, no sob, no headache, no fever.  Past Medical History  Diagnosis Date  . Hyperlipidemia   . Hypertension   . Coronary artery disease     nonobstructive cad  . Obesity   . Chest pain   . Enlarged prostate     followed by Dr. Serita Butcher    Past Surgical History  Procedure Date  . Cardiac catheterization     Dr. Einar Gip performed cath    Family History  Problem Relation Age of Onset  . Cirrhosis Father   . Other Mother     shot and killed    History  Substance Use Topics  . Smoking status: Never Smoker   . Smokeless tobacco: Not on file  . Alcohol Use: No      Review of Systems  All other systems reviewed and are negative.    Allergies  Penicillins  Home Medications   Current Outpatient Rx  Name  Route  Sig  Dispense  Refill  . AMLODIPINE BESYLATE 10 MG PO TABS   Oral   Take 10 mg by mouth daily.         . CHLORTHALIDONE 25 MG PO TABS   Oral   Take 25 mg by mouth daily.         Marland Kitchen ROSUVASTATIN CALCIUM 20 MG PO TABS   Oral   Take 20 mg by mouth daily.           BP 140/94  Pulse 75  Temp 98.7 F (37.1 C) (Oral)  Resp 18  SpO2  92%  Physical Exam  Nursing note and vitals reviewed. Constitutional: He is oriented to person, place, and time. He appears well-developed and well-nourished.  HENT:  Head: Normocephalic and atraumatic.  Right Ear: External ear normal.  Left Ear: External ear normal.  Nose: Nose normal.  Mouth/Throat: Oropharynx is clear and moist.  Eyes: Conjunctivae normal and EOM are normal. Pupils are equal, round, and reactive to light.  Neck: Normal range of motion. Neck supple. No JVD present. No tracheal deviation present. No thyromegaly present.  Cardiovascular: Normal rate, regular rhythm, normal heart sounds and intact distal pulses.  Exam reveals no gallop and no friction rub.   No murmur heard. Pulmonary/Chest: Effort normal and breath sounds normal. No stridor. No respiratory distress. He has no wheezes. He has no rales. He exhibits no tenderness.  Abdominal: Soft. Bowel sounds are normal. He exhibits no distension and no mass. There is no tenderness. There is no rebound and no guarding.  Musculoskeletal: Normal range of motion. He exhibits tenderness (ttp over right bicep, tricep and just superior/posterior elbow.  also ttp over right anterior thigh.  compartments soft.  Normal ROM, strength). He exhibits no edema.  Lymphadenopathy:    He has no cervical adenopathy.  Neurological: He is alert and oriented to person, place, and time. He has normal reflexes. No cranial nerve deficit. He exhibits normal muscle tone. Coordination normal.  Skin: Skin is warm and dry. No rash noted. No erythema. No pallor.  Psychiatric: He has a normal mood and affect. His behavior is normal. Judgment and thought content normal.    ED Course  Procedures (including critical care time)  Labs Reviewed  CBC WITH DIFFERENTIAL - Abnormal; Notable for the following:    Hemoglobin 12.8 (*)     MCH 25.6 (*)     All other components within normal limits  BASIC METABOLIC PANEL - Abnormal; Notable for the following:     Potassium 2.8 (*)     Glucose, Bld 123 (*)     Creatinine, Ser 1.68 (*)     GFR calc non Af Amer 45 (*)     GFR calc Af Amer 52 (*)     All other components within normal limits  URINALYSIS, ROUTINE W REFLEX MICROSCOPIC - Abnormal; Notable for the following:    Protein, ur 100 (*)     All other components within normal limits  URINE MICROSCOPIC-ADD ON - Abnormal; Notable for the following:    Casts HYALINE CASTS (*)     All other components within normal limits   Ct Head Wo Contrast  07/25/2012  *RADIOLOGY REPORT*  Clinical Data: Right-sided weakness  CT HEAD WITHOUT CONTRAST  Technique:  Contiguous axial images were obtained from the base of the skull through the vertex without contrast.  Comparison: 01/19/2006  Findings: Ventricle size is normal.  Negative for acute or chronic infarct.  Negative for hemorrhage or mass.  8 mm calcification right frontal white matter is unchanged.  Calvarium is intact.  IMPRESSION: No acute abnormality.  No interval change.   Original Report Authenticated By: Carl Best, M.D.      1. Hypokalemia   2. Muscle pain       MDM  52 yo male with right arm and leg pain, normal neuro exam, ttp over the areas.  Potassium noted to be low.  Will check CT scan of head to rule out possible stroke, but I think this is unlikely.  Will replete K, give pain medication and have pt f/u with pcm.        Kalman Drape, MD 07/25/12 [phone]

## 2012-07-28 ENCOUNTER — Encounter (HOSPITAL_COMMUNITY): Payer: Self-pay | Admitting: Nurse Practitioner

## 2012-07-28 ENCOUNTER — Emergency Department (HOSPITAL_COMMUNITY)
Admission: EM | Admit: 2012-07-28 | Discharge: 2012-07-28 | Disposition: A | Discharge status: Home / Self Care | Payer: 59 | Attending: Emergency Medicine | Admitting: Emergency Medicine

## 2012-07-28 DIAGNOSIS — Z79899 Other long term (current) drug therapy: Secondary | ICD-10-CM | POA: Insufficient documentation

## 2012-07-28 DIAGNOSIS — E785 Hyperlipidemia, unspecified: Secondary | ICD-10-CM | POA: Insufficient documentation

## 2012-07-28 DIAGNOSIS — E876 Hypokalemia: Secondary | ICD-10-CM

## 2012-07-28 DIAGNOSIS — I10 Essential (primary) hypertension: Secondary | ICD-10-CM | POA: Insufficient documentation

## 2012-07-28 DIAGNOSIS — Z87448 Personal history of other diseases of urinary system: Secondary | ICD-10-CM | POA: Insufficient documentation

## 2012-07-28 DIAGNOSIS — M109 Gout, unspecified: Secondary | ICD-10-CM | POA: Insufficient documentation

## 2012-07-28 DIAGNOSIS — E669 Obesity, unspecified: Secondary | ICD-10-CM | POA: Insufficient documentation

## 2012-07-28 LAB — CBC WITH DIFFERENTIAL/PLATELET
Basophils Relative: 0 % (ref 0–1)
Eosinophils Absolute: 0.1 10*3/uL (ref 0.0–0.7)
Eosinophils Relative: 1 % (ref 0–5)
MCH: 25.5 pg — ABNORMAL LOW (ref 26.0–34.0)
MCHC: 31.8 g/dL (ref 30.0–36.0)
MCV: 80.3 fL (ref 78.0–100.0)
Neutrophils Relative %: 66 % (ref 43–77)
Platelets: 262 10*3/uL (ref 150–400)

## 2012-07-28 LAB — BASIC METABOLIC PANEL
BUN: 17 mg/dL (ref 6–23)
CO2: 29 mEq/L (ref 19–32)
Calcium: 9.3 mg/dL (ref 8.4–10.5)
Creatinine, Ser: 1.65 mg/dL — ABNORMAL HIGH (ref 0.50–1.35)
GFR calc non Af Amer: 46 mL/min — ABNORMAL LOW (ref >90)
Glucose, Bld: 126 mg/dL — ABNORMAL HIGH (ref 70–99)
Sodium: 137 mEq/L (ref 135–145)

## 2012-07-28 MED ORDER — IBUPROFEN 400 MG PO TABS
800.0000 mg | ORAL_TABLET | Freq: Once | ORAL | Status: AC
  Administered 2012-07-28: 800 mg via ORAL
  Filled 2012-07-28: qty 2

## 2012-07-28 MED ORDER — POTASSIUM CHLORIDE CRYS ER 20 MEQ PO TBCR
40.0000 meq | EXTENDED_RELEASE_TABLET | Freq: Once | ORAL | Status: AC
  Administered 2012-07-28: 40 meq via ORAL
  Filled 2012-07-28: qty 2

## 2012-07-28 MED ORDER — IBUPROFEN 800 MG PO TABS: 800.0000 mg | ORAL_TABLET | Freq: Three times a day (TID) | ORAL | Status: DC

## 2012-07-28 MED ORDER — OXYCODONE-ACETAMINOPHEN 5-325 MG PO TABS: 2.0000 | ORAL_TABLET | ORAL | Status: DC | PRN

## 2012-07-28 MED ORDER — OXYCODONE-ACETAMINOPHEN 5-325 MG PO TABS
2.0000 | ORAL_TABLET | Freq: Once | ORAL | Status: AC
  Administered 2012-07-28: 2 via ORAL
  Filled 2012-07-28: qty 2

## 2012-07-28 MED ORDER — COLCHICINE 0.6 MG PO TABS
0.6000 mg | ORAL_TABLET | Freq: Once | ORAL | Status: AC
  Administered 2012-07-28: 0.6 mg via ORAL
  Filled 2012-07-28: qty 1

## 2012-07-28 NOTE — ED Notes (Signed)
Pt states he woke up today and is having severe L thigh pain radiating down to L ankle. Denies any injuries. Painful to walk.

## 2012-07-28 NOTE — ED Provider Notes (Signed)
History    This chart was scribed for non-physician practitioner working with Jonathan Dessert, MD by [name], ED Scribe. This patient was seen in room TR05C/TR05C and the patient's care was started at [place].   CSN: [id number]  Arrival date & time 07/28/12  1949   First MD Initiated Contact with Patient 07/28/12 1958      Chief Complaint  Patient presents with  . Leg Pain    The history is provided by the patient. No language interpreter was used.    [name] is a 52 y.o. male with h/o gout who presents to the Emergency Department complaining of gradual onset, constant, generalized extremity pain onset several days.  He states that the pain "moves" from one extremity to another and describes it as throbbing and cramping. Pt was seen in the ED earlier this week and was told he had very low levels of potassium. He was prescribed potassium pills and pain medication with no relief. He has not seen his PCP Dr. Quentin Cornwall yet. Pt is taking tramadol for pain with mild to no relief. 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 He denies fever, back pain, neck pain. He denies h/o blood clots.    Pt has h/o HTN, obesity, HLD.  Pt denies smoking and alcohol use. Past Medical History  Diagnosis Date  . Hyperlipidemia   . Hypertension   . Obesity   . Chest pain   . Enlarged prostate     followed by Dr. Serita Butcher    Past Surgical History  Procedure Laterality Date  . Cardiac catheterization      Dr. Einar Gip performed cath    Family History  Problem Relation Age of Onset  . Cirrhosis Father   . Other Mother     shot and killed    History  Substance Use Topics  . Smoking status: Never Smoker   . Smokeless tobacco: Not on file  . Alcohol Use: No      Review of Systems A complete 10 system review of systems was obtained and all systems are negative except as noted in the HPI and PMH.    Allergies  Penicillins  Home Medications   Current  Outpatient Rx  Name  Route  Sig  Dispense  Refill  . amLODipine (NORVASC) 10 MG tablet   Oral   Take 10 mg by mouth daily.         . potassium chloride (K-DUR) 10 MEQ tablet   Oral   Take 2 tablets (20 mEq total) by mouth 2 (two) times daily.   20 tablet   0   . rosuvastatin (CRESTOR) 20 MG tablet   Oral   Take 20 mg by mouth daily.         . traMADol (ULTRAM) 50 MG tablet   Oral   Take 1 tablet (50 mg total) by mouth every 6 (six) hours as needed for pain.   15 tablet   0     BP 140/109  Pulse 99  Temp(Src) 100.6 F (38.1 C) (Oral)  Resp 20  SpO2 95%  Physical Exam  Nursing note and vitals reviewed. Constitutional: He is oriented to person, place, and time. He appears well-developed and well-nourished. No distress.  HENT:  Head: Normocephalic and atraumatic.  Eyes: Conjunctivae and EOM are normal. Pupils are equal, round, and reactive to light.  Neck: Normal range of motion. Neck supple. No tracheal deviation present.  Cardiovascular:  Normal rate, regular rhythm, normal heart sounds and intact distal pulses.   Pulmonary/Chest: Effort normal and breath sounds normal. No respiratory distress.  Abdominal: He exhibits no distension.  Musculoskeletal: Normal range of motion.  Left knee edema. No obvious deformity. Generalized tenderness to palpation. ROM limited due to swelling and pain.   Neurological: He is alert and oriented to person, place, and time.  Skin: Skin is warm and dry.  Psychiatric: He has a normal mood and affect. His behavior is normal.    ED Course  Procedures (including critical care time)  DIAGNOSTIC STUDIES: Oxygen Saturation is 95% on room air, adequate by my interpretation.    COORDINATION OF CARE: 9:30 PMDiscussed treatment plan which includes CBC panel, basic metabolic panel with pt at bedside and pt agreed to plan.    11:00 PM Advised pt of lab work results. Discussed discharge plan which includes pain medication and follow up with  PCP with pt and pt agreed to plan.   Results for orders placed during the hospital encounter of [id number]  BASIC METABOLIC PANEL      Result Value Range   Sodium 137  135 - 145 mEq/L   Potassium 3.3 (*) 3.5 - 5.1 mEq/L   Chloride 98  96 - 112 mEq/L   CO2 29  19 - 32 mEq/L   Glucose, Bld 126 (*) 70 - 99 mg/dL   BUN 17  6 - 23 mg/dL   Creatinine, Ser 1.65 (*) 0.50 - 1.35 mg/dL   Calcium 9.3  8.4 - 10.5 mg/dL   GFR calc non Af Amer 46 (*) >90 mL/min   GFR calc Af Amer 53 (*) >90 mL/min  CBC WITH DIFFERENTIAL      Result Value Range   WBC 9.2  4.0 - 10.5 K/uL   RBC 5.02  4.22 - 5.81 MIL/uL   Hemoglobin 12.8 (*) 13.0 - 17.0 g/dL   HCT 40.3  39.0 - 52.0 %   MCV 80.3  78.0 - 100.0 fL   MCH 25.5 (*) 26.0 - 34.0 pg   MCHC 31.8  30.0 - 36.0 g/dL   RDW 15.6 (*) 11.5 - 15.5 %   Platelets 262  150 - 400 K/uL   Neutrophils Relative 66  43 - 77 %   Neutro Abs 6.0  1.7 - 7.7 K/uL   Lymphocytes Relative 23  12 - 46 %   Lymphs Abs 2.1  0.7 - 4.0 K/uL   Monocytes Relative 11  3 - 12 %   Monocytes Absolute 1.0  0.1 - 1.0 K/uL   Eosinophils Relative 1  0 - 5 %   Eosinophils Absolute 0.1  0.0 - 0.7 K/uL   Basophils Relative 0  0 - 1 %   Basophils Absolute 0.0  0.0 - 0.1 K/uL     Labs Reviewed - No data to display No results found.   1. Gout   2. Hypokalemia       MDM  Patient likely having pain due to hypokalemia and possible gout. Patient will be treated with Percocet and ibuprofen. Patient will follow up with PCP. Patient instructed to return with worsening or concerning symptoms.     I personally performed the services described in this documentation, which was scribed in my presence. The recorded information has been reviewed and is accurate.    Alvina Chou, [place] 07/29/12 [phone]

## 2012-07-30 NOTE — ED Provider Notes (Signed)
Medical screening examination/treatment/procedure(s) were performed by non-physician practitioner and as supervising physician I was immediately available for consultation/collaboration.   [name], MD 07/30/12 [phone]

## 2013-01-26 ENCOUNTER — Encounter (HOSPITAL_COMMUNITY): Payer: Self-pay | Admitting: *Deleted

## 2013-01-26 ENCOUNTER — Emergency Department (HOSPITAL_COMMUNITY)
Admission: EM | Admit: 2013-01-26 | Discharge: 2013-01-26 | Disposition: A | Discharge status: Home / Self Care | Payer: 59 | Attending: Emergency Medicine | Admitting: Emergency Medicine

## 2013-01-26 DIAGNOSIS — Z8679 Personal history of other diseases of the circulatory system: Secondary | ICD-10-CM | POA: Insufficient documentation

## 2013-01-26 DIAGNOSIS — Z79899 Other long term (current) drug therapy: Secondary | ICD-10-CM | POA: Insufficient documentation

## 2013-01-26 DIAGNOSIS — Z87448 Personal history of other diseases of urinary system: Secondary | ICD-10-CM | POA: Insufficient documentation

## 2013-01-26 DIAGNOSIS — E669 Obesity, unspecified: Secondary | ICD-10-CM | POA: Insufficient documentation

## 2013-01-26 DIAGNOSIS — M545 Low back pain, unspecified: Secondary | ICD-10-CM | POA: Insufficient documentation

## 2013-01-26 DIAGNOSIS — Z9889 Other specified postprocedural states: Secondary | ICD-10-CM | POA: Insufficient documentation

## 2013-01-26 DIAGNOSIS — Z880 Allergy status to penicillin: Secondary | ICD-10-CM | POA: Insufficient documentation

## 2013-01-26 DIAGNOSIS — I10 Essential (primary) hypertension: Secondary | ICD-10-CM | POA: Insufficient documentation

## 2013-01-26 DIAGNOSIS — M549 Dorsalgia, unspecified: Secondary | ICD-10-CM

## 2013-01-26 MED ORDER — HYDROCODONE-ACETAMINOPHEN 5-325 MG PO TABS: 1.0000 | ORAL_TABLET | ORAL | Status: DC | PRN

## 2013-01-26 MED ORDER — CYCLOBENZAPRINE HCL 10 MG PO TABS: 10.0000 mg | ORAL_TABLET | Freq: Two times a day (BID) | ORAL | Status: DC | PRN

## 2013-01-26 MED ORDER — IBUPROFEN 800 MG PO TABS: 800.0000 mg | ORAL_TABLET | Freq: Three times a day (TID) | ORAL | Status: DC

## 2013-01-26 NOTE — ED Notes (Signed)
Pt states that he has been having lower back pain, sacral area and it has been for the past 3 days. Pt takes flexeril and states that the pain is still bad. Pt states that when he walks the pain radiates to his testicles.

## 2013-01-26 NOTE — ED Provider Notes (Signed)
CSN: [id number]     Arrival date & time 01/26/13  2137 History  This chart was scribed for Lenard Galloway, non-physician practitioner working with Wandra Arthurs, MD by [name], ED Scribe. This patient was seen in room TR05C/TR05C and the patient's care was started at 2137.    Chief Complaint  Patient presents with  . Back Pain    The history is provided by the patient. No language interpreter was used.    HPI Comments: [name] is a 52 y.o. male who presents to the Emergency Department complaining of constant, unchanged lower back pain starting about 3 days ago. Pt states the pain radiates into the right groin when standing or walking. Movement and walking worsens the pain. Rest helps to alleviate the pain. Denies loss of bowel or bladder function, testicular swelling, abdominal pain, dysuria, nausea, vomiting.    Past Medical History  Diagnosis Date  . Hyperlipidemia   . Hypertension   . Obesity   . Chest pain   . Enlarged prostate     followed by Dr. Serita Butcher   Past Surgical History  Procedure Laterality Date  . Cardiac catheterization      Dr. Einar Gip performed cath   Family History  Problem Relation Age of Onset  . Cirrhosis Father   . Other Mother     shot and killed   History  Substance Use Topics  . Smoking status: Never Smoker   . Smokeless tobacco: Not on file  . Alcohol Use: No    Review of Systems  Genitourinary: Negative for scrotal swelling and testicular pain.  Musculoskeletal: Positive for back pain.  All other systems reviewed and are negative.    Allergies  Penicillins  Home Medications   Current Outpatient Rx  Name  Route  Sig  Dispense  Refill  . amLODipine (NORVASC) 10 MG tablet   Oral   Take 10 mg by mouth daily.         . furosemide (LASIX) 20 MG tablet   Oral   Take 20 mg by mouth daily.         . rosuvastatin (CRESTOR) 20 MG tablet   Oral   Take 20 mg by mouth daily.          BP 123/77  Pulse 93   Temp(Src) 98.6 F (37 C) (Oral)  Resp 16  SpO2 98% Physical Exam  Nursing note and vitals reviewed. Constitutional: He is oriented to person, place, and time. He appears well-developed and well-nourished.  HENT:  Head: Normocephalic and atraumatic.  Neck: Neck supple.  Pulmonary/Chest: Effort normal.  Abdominal: There is no tenderness.  Musculoskeletal:  Right para lumbar tenderness without swelling.   Neurological: He is alert and oriented to person, place, and time. No cranial nerve deficit.  No neurological deficits.   Psychiatric: He has a normal mood and affect. His behavior is normal.    ED Course   Procedures (including critical care time)  DIAGNOSTIC STUDIES: Oxygen Saturation is 98% on RA, normal by my interpretation.    COORDINATION OF CARE: 10:17 PM Discussed treatment plan with pt at bedside and pt agreed to plan.   Labs Reviewed - No data to display No results found. No diagnosis found. 1. Back pain MDM  Uncomplicated muscular back pain  I personally performed the services described in this documentation, which was scribed in my presence. The recorded information has been reviewed and is accurate.     Dewaine Oats, PA-C  01/26/13 2330 

## 2013-01-27 NOTE — ED Provider Notes (Signed)
Medical screening examination/treatment/procedure(s) were performed by non-physician practitioner and as supervising physician I was immediately available for consultation/collaboration.   Wandra Arthurs, MD 01/27/13 Dyann Kief

## 2013-02-10 ENCOUNTER — Emergency Department (HOSPITAL_COMMUNITY)
Admission: EM | Admit: 2013-02-10 | Discharge: 2013-02-11 | Disposition: A | Discharge status: Home / Self Care | Payer: 59 | Attending: Emergency Medicine | Admitting: Emergency Medicine

## 2013-02-10 ENCOUNTER — Encounter (HOSPITAL_COMMUNITY): Payer: Self-pay | Admitting: Emergency Medicine

## 2013-02-10 DIAGNOSIS — M545 Low back pain, unspecified: Secondary | ICD-10-CM | POA: Insufficient documentation

## 2013-02-10 DIAGNOSIS — M549 Dorsalgia, unspecified: Secondary | ICD-10-CM

## 2013-02-10 DIAGNOSIS — IMO0002 Reserved for concepts with insufficient information to code with codable children: Secondary | ICD-10-CM

## 2013-02-10 DIAGNOSIS — I10 Essential (primary) hypertension: Secondary | ICD-10-CM | POA: Insufficient documentation

## 2013-02-10 DIAGNOSIS — E669 Obesity, unspecified: Secondary | ICD-10-CM | POA: Insufficient documentation

## 2013-02-10 DIAGNOSIS — E785 Hyperlipidemia, unspecified: Secondary | ICD-10-CM | POA: Insufficient documentation

## 2013-02-10 DIAGNOSIS — Z888 Allergy status to other drugs, medicaments and biological substances status: Secondary | ICD-10-CM | POA: Insufficient documentation

## 2013-02-10 DIAGNOSIS — Z880 Allergy status to penicillin: Secondary | ICD-10-CM | POA: Insufficient documentation

## 2013-02-10 DIAGNOSIS — N400 Enlarged prostate without lower urinary tract symptoms: Secondary | ICD-10-CM | POA: Insufficient documentation

## 2013-02-10 DIAGNOSIS — Z79899 Other long term (current) drug therapy: Secondary | ICD-10-CM | POA: Insufficient documentation

## 2013-02-10 DIAGNOSIS — M5126 Other intervertebral disc displacement, lumbar region: Secondary | ICD-10-CM | POA: Insufficient documentation

## 2013-02-10 NOTE — ED Notes (Signed)
PT with c/o groin pain, mostly right sided. Radiating from back. No urinary Sx. Pt states that it hurts more when walking. NO swollen areas in groin or obvious hernia. No known causing injury. Pain is centered in scrotum. BM WNL.

## 2013-02-11 ENCOUNTER — Emergency Department (HOSPITAL_COMMUNITY): Payer: 59

## 2013-02-11 MED ORDER — HYDROCODONE-ACETAMINOPHEN 5-325 MG PO TABS: 1.0000 | ORAL_TABLET | ORAL | Status: DC | PRN

## 2013-02-11 MED ORDER — TRAMADOL HCL 50 MG PO TABS
50.0000 mg | ORAL_TABLET | Freq: Once | ORAL | Status: AC
  Administered 2013-02-11: 50 mg via ORAL
  Filled 2013-02-11: qty 1

## 2013-02-11 NOTE — ED Provider Notes (Signed)
Medical screening examination/treatment/procedure(s) were performed by non-physician practitioner and as supervising physician I was immediately available for consultation/collaboration.  Carlisle Beers, MD 02/11/13 [phone]

## 2013-02-11 NOTE — ED Provider Notes (Signed)
CSN: [id number]     Arrival date & time 02/10/13  1731 History   First MD Initiated Contact with Patient 02/10/13 2345     Chief Complaint  Patient presents with  . Back Pain   HPI  History provided by the patient. Patient is a 52 year old male with history of hypertension, hyperlipidemia and enlarged prostate who presents with complaints of right lower back pain. Pain has been present for the past 2 weeks or more. 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 Pain is worse with certain movements or walking. It radiates some into the right buttocks and groin area. Patient states that he saw Dr. Jasmine December with urology on Friday and also mentioned his pains and discomforts. He states he had unremarkable urine study and ultrasound without signs of kidney stones or other cause. His pain is down low near the pelvis on the back. He denies any weakness or numbness in the lower extremities. There is been no urinary or fecal incontinence, urinary retention or perineal numbness. He denies any fever, chills or sweats. He has had very minimal relief with over-the-counter pain medicine and Flexeril from previous prescription.    Past Medical History  Diagnosis Date  . Hyperlipidemia   . Hypertension   . Obesity   . Chest pain   . Enlarged prostate     followed by Dr. Serita Butcher   Past Surgical History  Procedure Laterality Date  . Cardiac catheterization      Dr. Einar Gip performed cath   Family History  Problem Relation Age of Onset  . Cirrhosis Father   . Other Mother     shot and killed   History  Substance Use Topics  . Smoking status: Never Smoker   . Smokeless tobacco: Not on file  . Alcohol Use: No    Review of Systems  Constitutional: Negative for chills and diaphoresis.  Genitourinary: Negative for dysuria, frequency, hematuria and flank pain.  Neurological: Negative for weakness and numbness.  All other systems reviewed and  are negative.    Allergies  Nsaids and Penicillins  Home Medications   Current Outpatient Rx  Name  Route  Sig  Dispense  Refill  . amLODipine (NORVASC) 10 MG tablet   Oral   Take 10 mg by mouth every morning.          . cyclobenzaprine (FLEXERIL) 10 MG tablet   Oral   Take 1 tablet (10 mg total) by mouth 2 (two) times daily as needed for muscle spasms.   20 tablet   0   . furosemide (LASIX) 20 MG tablet   Oral   Take 20 mg by mouth daily.         . rosuvastatin (CRESTOR) 20 MG tablet   Oral   Take 20 mg by mouth daily.         . tamsulosin (FLOMAX) 0.4 MG CAPS capsule   Oral   Take 0.4 mg by mouth every morning.          BP 132/91  Pulse 78  Temp(Src) 97 F (36.1 C) (Oral)  Resp 16  SpO2 98% Physical Exam  Nursing note and vitals reviewed. Constitutional: He is oriented to person, place, and time. He appears well-developed and well-nourished. No distress.  HENT:  Head: Normocephalic.  Neck: Normal range of motion. Neck supple.  Cardiovascular: Normal rate and regular rhythm.   Pulmonary/Chest: Effort normal and breath sounds normal. No respiratory  distress. He has no wheezes. He has no rales.  Abdominal: Soft. There is no tenderness. There is no rebound and no guarding.  Musculoskeletal: Normal range of motion.       Lumbar back: He exhibits tenderness. He exhibits no bony tenderness, no swelling and no deformity.       Back:  Neurological: He is alert and oriented to person, place, and time. He has normal strength. No sensory deficit. Gait normal.  Skin: Skin is warm.  Psychiatric: He has a normal mood and affect. His behavior is normal.    ED Course  Procedures Labs Review Labs Reviewed - No data to display   Imaging Review No results found.  MDM   1. Back pain   2. Degenerative disc disease      12:10AM patient seen and evaluated. Patient is well-appearing in no acute distress. Patient has been seen once in the emergency department  for similar taking muscle relaxers without significant improvement. Patient also seen by Dr. Jasmine December with urology with unremarkable workup for pains. They do seem muscle skeletal nature as they're worse with movements.  X-rays do show degenerative changes to the lower spine. This may be a cause of his exasperated pain and discomfort. We'll treat with Norco and have him followup with PCP for continued evaluation and treatment    Martie Lee, PA-C 02/11/13 0157

## 2013-03-12 ENCOUNTER — Emergency Department (HOSPITAL_COMMUNITY)
Admission: EM | Admit: 2013-03-12 | Discharge: 2013-03-12 | Disposition: A | Discharge status: Home / Self Care | Payer: 59 | Attending: Emergency Medicine | Admitting: Emergency Medicine

## 2013-03-12 ENCOUNTER — Encounter (HOSPITAL_COMMUNITY): Payer: Self-pay | Admitting: Emergency Medicine

## 2013-03-12 ENCOUNTER — Emergency Department (HOSPITAL_COMMUNITY): Payer: 59

## 2013-03-12 DIAGNOSIS — N138 Other obstructive and reflux uropathy: Secondary | ICD-10-CM | POA: Insufficient documentation

## 2013-03-12 DIAGNOSIS — N509 Disorder of male genital organs, unspecified: Secondary | ICD-10-CM | POA: Diagnosis not present

## 2013-03-12 DIAGNOSIS — IMO0002 Reserved for concepts with insufficient information to code with codable children: Secondary | ICD-10-CM | POA: Diagnosis not present

## 2013-03-12 DIAGNOSIS — E785 Hyperlipidemia, unspecified: Secondary | ICD-10-CM | POA: Insufficient documentation

## 2013-03-12 DIAGNOSIS — N400 Enlarged prostate without lower urinary tract symptoms: Secondary | ICD-10-CM

## 2013-03-12 DIAGNOSIS — I10 Essential (primary) hypertension: Secondary | ICD-10-CM | POA: Insufficient documentation

## 2013-03-12 DIAGNOSIS — M109 Gout, unspecified: Secondary | ICD-10-CM | POA: Diagnosis not present

## 2013-03-12 DIAGNOSIS — R350 Frequency of micturition: Secondary | ICD-10-CM | POA: Diagnosis not present

## 2013-03-12 DIAGNOSIS — Z880 Allergy status to penicillin: Secondary | ICD-10-CM | POA: Diagnosis not present

## 2013-03-12 DIAGNOSIS — Z79899 Other long term (current) drug therapy: Secondary | ICD-10-CM | POA: Insufficient documentation

## 2013-03-12 DIAGNOSIS — Z9861 Coronary angioplasty status: Secondary | ICD-10-CM | POA: Diagnosis not present

## 2013-03-12 DIAGNOSIS — E669 Obesity, unspecified: Secondary | ICD-10-CM | POA: Diagnosis not present

## 2013-03-12 DIAGNOSIS — M79609 Pain in unspecified limb: Secondary | ICD-10-CM | POA: Diagnosis not present

## 2013-03-12 DIAGNOSIS — N401 Enlarged prostate with lower urinary tract symptoms: Secondary | ICD-10-CM | POA: Insufficient documentation

## 2013-03-12 HISTORY — DX: Gout, unspecified: M10.9

## 2013-03-12 LAB — URINALYSIS, ROUTINE W REFLEX MICROSCOPIC
Bilirubin Urine: NEGATIVE
Hgb urine dipstick: NEGATIVE
Ketones, ur: NEGATIVE mg/dL
Nitrite: NEGATIVE
Specific Gravity, Urine: 1.017 (ref 1.005–1.030)
Urobilinogen, UA: 0.2 mg/dL (ref 0.0–1.0)
pH: 6.5 (ref 5.0–8.0)

## 2013-03-12 LAB — GLUCOSE, CAPILLARY: Glucose-Capillary: 118 mg/dL — ABNORMAL HIGH (ref 70–99)

## 2013-03-12 LAB — URINE MICROSCOPIC-ADD ON

## 2013-03-12 MED ORDER — COLCHICINE 0.6 MG PO TABS: 0.6000 mg | ORAL_TABLET | Freq: Two times a day (BID) | ORAL | Status: DC

## 2013-03-12 MED ORDER — PREDNISONE 10 MG PO TABS: 20.0000 mg | ORAL_TABLET | Freq: Every day | ORAL | Status: DC

## 2013-03-12 MED ORDER — HYDROCODONE-ACETAMINOPHEN 5-325 MG PO TABS
2.0000 | ORAL_TABLET | Freq: Once | ORAL | Status: AC
  Administered 2013-03-12: 2 via ORAL
  Filled 2013-03-12: qty 2

## 2013-03-12 MED ORDER — OXYCODONE-ACETAMINOPHEN 5-325 MG PO TABS: 1.0000 | ORAL_TABLET | Freq: Four times a day (QID) | ORAL | Status: DC | PRN

## 2013-03-12 NOTE — ED Notes (Signed)
Per pt, states no injury to left arm-started hurting yesterday-has history of gout-states increased urination, is having problems with prostate-seeing urologist tomorrow-multiple complaints

## 2013-03-12 NOTE — ED Provider Notes (Signed)
CSN: [id number]     Arrival date & time 03/12/13  1216 History   First MD Initiated Contact with Patient 03/12/13 1247     Chief Complaint  Patient presents with  . Arm Pain  . Urinary Frequency   (Consider location/radiation/quality/duration/timing/severity/associated sxs/prior Treatment) HPI Comments: Patient is a 52 y/o male who presents for L arm pain with onset last night. Patient states that pain is throbbing and aching in nature and nonradiating; pain is present in the entire LUE. Patient denies any injury or trauma inciting his pain. He denies taking anything PO for the pain. Patient states pain is aggravated with abduction and without alleviating factors. Symptoms associated with swelling in L elbow joint. He denies fevers, recent surgeries/hospitalizations, prolonged travel, HRT, and a hx of DVT/PE.   Patient also endorses secondary complaint of urinary frequency. He has a hx of BPH and endorses f/u with urology tomorrow for PSA check and further evaluation of prostatic hyperplasia. He endorses associated "nagging" pain in b/l testicles that is worse with ambulation x 1.5 months. He states his urologist is also aware of this complaint. He denies dysuria, N/V/D, melena, hematochezia, scrotal redness, penile d/c and swelling.   The history is provided by the patient. No language interpreter was used.    Past Medical History  Diagnosis Date  . Hyperlipidemia   . Hypertension   . Obesity   . Chest pain   . Enlarged prostate     followed by Dr. Serita Butcher  . Gout   . BPH (benign prostatic hyperplasia)    Past Surgical History  Procedure Laterality Date  . Cardiac catheterization      Dr. Einar Gip performed cath   Family History  Problem Relation Age of Onset  . Cirrhosis Father   . Other Mother     shot and killed   History  Substance Use Topics  . Smoking status: Never Smoker   . Smokeless tobacco: Not on file  . Alcohol Use: No    Review of Systems  Constitutional:  Negative for fever.  Gastrointestinal: Negative for nausea, vomiting, diarrhea and blood in stool.  Genitourinary: Positive for frequency and testicular pain. Negative for dysuria, hematuria, discharge, penile swelling, scrotal swelling and penile pain.  All other systems reviewed and are negative.    Allergies  Nsaids and Penicillins  Home Medications   Current Outpatient Rx  Name  Route  Sig  Dispense  Refill  . amLODipine (NORVASC) 10 MG tablet   Oral   Take 10 mg by mouth every morning.          . chlorthalidone (HYGROTON) 25 MG tablet   Oral   Take 25 mg by mouth daily.         . finasteride (PROSCAR) 5 MG tablet   Oral   Take 5 mg by mouth daily.         . rosuvastatin (CRESTOR) 20 MG tablet   Oral   Take 20 mg by mouth daily.         . tamsulosin (FLOMAX) 0.4 MG CAPS capsule   Oral   Take 0.4 mg by mouth daily after supper.          . colchicine 0.6 MG tablet   Oral   Take 1 tablet (0.6 mg total) by mouth 2 (two) times daily.   8 tablet   0   . oxyCODONE-acetaminophen (PERCOCET/ROXICET) 5-325 MG per tablet   Oral   Take 1 tablet by mouth every 6 (six)  hours as needed for pain.   7 tablet   0   . predniSONE (DELTASONE) 10 MG tablet   Oral   Take 2 tablets (20 mg total) by mouth daily.   15 tablet   0    BP 118/89  Pulse 93  Temp(Src) 98.1 F (36.7 C) (Oral)  Resp 18  SpO2 99% Physical Exam  Nursing note and vitals reviewed. Constitutional: He is oriented to person, place, and time. He appears well-developed and well-nourished. No distress.  HENT:  Head: Normocephalic and atraumatic.  Eyes: Conjunctivae and EOM are normal. No scleral icterus.  Neck: Normal range of motion.  Cardiovascular: Normal rate, regular rhythm and intact distal pulses.   Distal radial pulses 2+ bilaterally  Pulmonary/Chest: Effort normal. No respiratory distress.  Genitourinary: Rectum normal. Prostate is enlarged. Prostate is not tender.  Chaperoned GU exam  significant for enlarged, firm, nontender prostate. Rectal tone normal.  Musculoskeletal:       Left elbow: He exhibits decreased range of motion (secondary to pain) and swelling. He exhibits no deformity and no laceration. Tenderness found. Olecranon process tenderness noted.       Left upper arm: Normal.       Left forearm: Normal.  Swelling and TTP of L elbow with mild effusion on physical exam. No heat-to-touch, erythema, or red linear streaking appreciated.   Neurological: He is alert and oriented to person, place, and time.  Equal grip strength bilaterally. No sensory or motor deficits appreciated.  Skin: Skin is warm and dry. No rash noted. He is not diaphoretic. No erythema. No pallor.  Psychiatric: He has a normal mood and affect. His behavior is normal.    ED Course  Procedures (including critical care time) Labs Review Labs Reviewed  GLUCOSE, CAPILLARY - Abnormal; Notable for the following:    Glucose-Capillary 118 (*)    All other components within normal limits  URINALYSIS, ROUTINE W REFLEX MICROSCOPIC - Abnormal; Notable for the following:    Protein, ur 100 (*)    All other components within normal limits  URINE MICROSCOPIC-ADD ON   Imaging Review Dg Elbow Complete Left  03/12/2013   CLINICAL DATA:  Elbow pain for 2 days  EXAM: LEFT ELBOW - COMPLETE 3+ VIEW  COMPARISON:  10/18/2011  FINDINGS: Four views of the left elbow submitted. No acute fracture or subluxation. Again noted degenerative changes at humeral ulnar articulation and radial capitellar articulation. Irregularity of articular surface again noted. There is posterior fat pad sign probable due to joint effusion. No radiopaque foreign body.  IMPRESSION: No acute fracture or subluxation. Again noted degenerative changes. There is positive posterior fat pad sign probable due to joint effusion. No radiopaque foreign body.   Electronically Signed   By: Lahoma Crocker   On: 03/12/2013 14:43    MDM   1. Idiopathic gout of  elbow, left   2. BPH (benign prostatic hyperplasia)   3. Urinary frequency    Patient presents for L elbow pain; hx of gout and hx of same pain in same joint 1 year prior. Xray findings without bony abnormalities; there is a joint effusion with +fat pad sign. No erythema, red linear streaking, heat to touch, or fevers to suspect infectious/septic joint. Neurovascularly intact. Patient tx in ED with PO Norco for pain control. Shoulder sling applied for comfort. Urinary frequency c/w BPH and patient endorses f/u with urology tomorrow. Patient reliable for follow up with no evidence of UTI on UA. Patient appropriate for d/c with  Rx for colchicine, percocet, and prednisone for symptoms. Return precautions discussed and patient agreeable to plan with no unaddressed concerns.    Filed Vitals:   03/12/13 1248 03/12/13 1444 03/12/13 1444 03/12/13 1444  BP: 145/91 139/94 120/87 118/89  Pulse: 89  94 93  Temp: 98.1 F (36.7 C)     TempSrc: Oral     Resp: 18     SpO2: 98% 96% 100% 99%     Antonietta Breach, PA-C 03/15/13 1040  Medical screening examination/treatment/procedure(s) were conducted as a shared visit with non-physician practitioner(s) or resident and myself. I personally evaluated the patient during the encounter and agree with the findings and plan unless otherwise indicated.  Left elbow pain for 24 hrs, similar to gout hx however pt has not had in elbow in the past. No injury. No fever or redness. Pain with palpation. NV intact, soft compartments. Pt has tender left elbow, mild pain with rom, joint effusion, pt can range arm up to 15 degrees and then unable to go further. Concern for gout vs bursitis vs cartilage/ loose body in joint vs less likely infectious. Stressed close fup with ortho bc if no improvement or if develops hot joint/ fevers he will need a tap. If unable to get into ortho he is to come back to ER for joint aspiration. Pt comfortable with plan. DC Xray reviewed, joint  effusion.   Mariea Clonts, MD 03/16/13 [phone]

## 2013-04-11 ENCOUNTER — Emergency Department (HOSPITAL_COMMUNITY)
Admission: EM | Admit: 2013-04-11 | Discharge: 2013-04-11 | Disposition: A | Discharge status: Home / Self Care | Payer: 59 | Attending: Emergency Medicine | Admitting: Emergency Medicine

## 2013-04-11 ENCOUNTER — Encounter (HOSPITAL_COMMUNITY): Payer: Self-pay | Admitting: Emergency Medicine

## 2013-04-11 DIAGNOSIS — E669 Obesity, unspecified: Secondary | ICD-10-CM | POA: Insufficient documentation

## 2013-04-11 DIAGNOSIS — Z880 Allergy status to penicillin: Secondary | ICD-10-CM | POA: Insufficient documentation

## 2013-04-11 DIAGNOSIS — I10 Essential (primary) hypertension: Secondary | ICD-10-CM | POA: Insufficient documentation

## 2013-04-11 DIAGNOSIS — Z79899 Other long term (current) drug therapy: Secondary | ICD-10-CM | POA: Insufficient documentation

## 2013-04-11 DIAGNOSIS — E785 Hyperlipidemia, unspecified: Secondary | ICD-10-CM | POA: Insufficient documentation

## 2013-04-11 DIAGNOSIS — IMO0002 Reserved for concepts with insufficient information to code with codable children: Secondary | ICD-10-CM | POA: Insufficient documentation

## 2013-04-11 DIAGNOSIS — N400 Enlarged prostate without lower urinary tract symptoms: Secondary | ICD-10-CM | POA: Insufficient documentation

## 2013-04-11 DIAGNOSIS — M109 Gout, unspecified: Secondary | ICD-10-CM

## 2013-04-11 DIAGNOSIS — Z9889 Other specified postprocedural states: Secondary | ICD-10-CM | POA: Insufficient documentation

## 2013-04-11 MED ORDER — PREDNISONE 20 MG PO TABS
60.0000 mg | ORAL_TABLET | Freq: Once | ORAL | Status: AC
  Administered 2013-04-11: 60 mg via ORAL
  Filled 2013-04-11: qty 3

## 2013-04-11 MED ORDER — PREDNISONE 20 MG PO TABS: 60.0000 mg | ORAL_TABLET | Freq: Every day | ORAL | Status: DC

## 2013-04-11 MED ORDER — OXYCODONE-ACETAMINOPHEN 5-325 MG PO TABS
2.0000 | ORAL_TABLET | Freq: Once | ORAL | Status: AC
  Administered 2013-04-11: 2 via ORAL
  Filled 2013-04-11: qty 2

## 2013-04-11 MED ORDER — HYDROMORPHONE HCL PF 2 MG/ML IJ SOLN
2.0000 mg | Freq: Once | INTRAMUSCULAR | Status: AC
  Administered 2013-04-11: 2 mg via INTRAMUSCULAR
  Filled 2013-04-11: qty 1

## 2013-04-11 MED ORDER — OXYCODONE-ACETAMINOPHEN 5-325 MG PO TABS: 2.0000 | ORAL_TABLET | ORAL | Status: DC | PRN

## 2013-04-11 NOTE — ED Notes (Signed)
Per Patient: Pt reports he thinks he is having a gout flare up in his R Elbow. Patient reports onset of pain was approx. @ 2000 tonight. Affected elbow appears to inflamed and is warm to touch. Ax4, NAD.

## 2013-04-11 NOTE — ED Provider Notes (Signed)
CSN: [id number]     Arrival date & time 04/11/13  0111 History   First MD Initiated Contact with Patient 04/11/13 0129     Chief Complaint  Patient presents with  . Gout   (Consider location/radiation/quality/duration/timing/severity/associated sxs/prior Treatment) HPI 52 yo male presents to the ER with complaint of right elbow pain and swelling.  Pt has h/o gout, reports he feels he is having a flare.  Pt was seen for same about a month ago, reports he did get better with prednisone and colchicine.  He was seen at orthopedics, and is waiting for follow up visit.  Pain started yesterday and worsened tonight.  Pain with movement, palpation of the right elbow.  No fevers, no trauma.  Past Medical History  Diagnosis Date  . Hyperlipidemia   . Hypertension   . Obesity   . Chest pain   . Enlarged prostate     followed by Dr. Serita Butcher  . Gout   . BPH (benign prostatic hyperplasia)    Past Surgical History  Procedure Laterality Date  . Cardiac catheterization      Dr. Einar Gip performed cath   Family History  Problem Relation Age of Onset  . Cirrhosis Father   . Other Mother     shot and killed   History  Substance Use Topics  . Smoking status: Never Smoker   . Smokeless tobacco: Not on file  . Alcohol Use: No    Review of Systems  All other systems reviewed and are negative.  other than listed in hpi  Allergies  Nsaids and Penicillins  Home Medications   Current Outpatient Rx  Name  Route  Sig  Dispense  Refill  . amLODipine (NORVASC) 10 MG tablet   Oral   Take 10 mg by mouth every morning.          . chlorthalidone (HYGROTON) 25 MG tablet   Oral   Take 25 mg by mouth daily.         . colchicine 0.6 MG tablet   Oral   Take 1 tablet (0.6 mg total) by mouth 2 (two) times daily.   8 tablet   0   . rosuvastatin (CRESTOR) 20 MG tablet   Oral   Take 20 mg by mouth daily.         . tamsulosin (FLOMAX) 0.4 MG CAPS capsule   Oral   Take 0.4 mg by mouth  daily after supper.          Marland Kitchen oxyCODONE-acetaminophen (PERCOCET/ROXICET) 5-325 MG per tablet   Oral   Take 2 tablets by mouth every 4 (four) hours as needed for pain.   30 tablet   0   . predniSONE (DELTASONE) 20 MG tablet   Oral   Take 3 tablets (60 mg total) by mouth daily.   15 tablet   0    BP 164/100  Pulse 84  Temp(Src) 98.1 F (36.7 C) (Oral)  Resp 22  SpO2 96% Physical Exam  Nursing note and vitals reviewed. Constitutional: He appears well-developed and well-nourished. He appears distressed.  HENT:  Head: Normocephalic and atraumatic.  Cardiovascular: Normal rate, regular rhythm, normal heart sounds and intact distal pulses.  Exam reveals no gallop and no friction rub.   No murmur heard. Pulmonary/Chest: Effort normal and breath sounds normal. No respiratory distress. He has no wheezes. He has no rales. He exhibits no tenderness.  Musculoskeletal: He exhibits edema and tenderness.  Edema, effusion, warmth of right elbow.  Decreased  ROM due to pain.  Distally NVI.  No overlying skin changes  Skin: Skin is warm and dry. No rash noted. No erythema. No pallor.    ED Course  Procedures (including critical care time) Labs Review Labs Reviewed - No data to display Imaging Review No results found.  EKG Interpretation   None       MDM   1. Gout, arthritis    52 yo male with acute gouty flare.  Pain improved with dilaudid.  Will d/c on percocet, prednisone.  Pt instructed to f/u with pcm and or ortho for possible allopurinol, other tx.     Kalman Drape, MD 04/11/13 [phone]

## 2013-05-29 ENCOUNTER — Encounter (HOSPITAL_COMMUNITY): Payer: Self-pay | Admitting: Emergency Medicine

## 2013-05-29 ENCOUNTER — Inpatient Hospital Stay (HOSPITAL_COMMUNITY)
Admission: EM | Admit: 2013-05-29 | Discharge: 2013-05-31 | DRG: 641 | Disposition: A | Discharge status: Home / Self Care | Payer: 59 | Attending: Internal Medicine | Admitting: Internal Medicine

## 2013-05-29 DIAGNOSIS — E876 Hypokalemia: Principal | ICD-10-CM | POA: Diagnosis present

## 2013-05-29 DIAGNOSIS — R531 Weakness: Secondary | ICD-10-CM | POA: Diagnosis present

## 2013-05-29 DIAGNOSIS — I129 Hypertensive chronic kidney disease with stage 1 through stage 4 chronic kidney disease, or unspecified chronic kidney disease: Secondary | ICD-10-CM | POA: Diagnosis present

## 2013-05-29 DIAGNOSIS — E785 Hyperlipidemia, unspecified: Secondary | ICD-10-CM

## 2013-05-29 DIAGNOSIS — R9431 Abnormal electrocardiogram [ECG] [EKG]: Secondary | ICD-10-CM

## 2013-05-29 DIAGNOSIS — N183 Chronic kidney disease, stage 3 unspecified: Secondary | ICD-10-CM

## 2013-05-29 DIAGNOSIS — N400 Enlarged prostate without lower urinary tract symptoms: Secondary | ICD-10-CM | POA: Diagnosis present

## 2013-05-29 DIAGNOSIS — M109 Gout, unspecified: Secondary | ICD-10-CM | POA: Diagnosis present

## 2013-05-29 DIAGNOSIS — I10 Essential (primary) hypertension: Secondary | ICD-10-CM | POA: Diagnosis present

## 2013-05-29 LAB — COMPREHENSIVE METABOLIC PANEL
ALT: 10 U/L (ref 0–53)
AST: 14 U/L (ref 0–37)
Albumin: 3.6 g/dL (ref 3.5–5.2)
Alkaline Phosphatase: 76 U/L (ref 39–117)
BUN: 17 mg/dL (ref 6–23)
CO2: 33 mEq/L — ABNORMAL HIGH (ref 19–32)
Chloride: 101 mEq/L (ref 96–112)
GFR calc non Af Amer: 42 mL/min — ABNORMAL LOW (ref >90)
Potassium: 2.4 mEq/L — CL (ref 3.5–5.1)
Sodium: 145 mEq/L (ref 135–145)
Total Bilirubin: 0.6 mg/dL (ref 0.3–1.2)

## 2013-05-29 LAB — CBC WITH DIFFERENTIAL/PLATELET
Basophils Absolute: 0 10*3/uL (ref 0.0–0.1)
Basophils Relative: 0 % (ref 0–1)
Hemoglobin: 12.9 g/dL — ABNORMAL LOW (ref 13.0–17.0)
Lymphs Abs: 2.9 10*3/uL (ref 0.7–4.0)
MCHC: 31.6 g/dL (ref 30.0–36.0)
Monocytes Relative: 12 % (ref 3–12)
Neutro Abs: 4.7 10*3/uL (ref 1.7–7.7)
Neutrophils Relative %: 54 % (ref 43–77)
Platelets: 284 10*3/uL (ref 150–400)
RBC: 5.06 MIL/uL (ref 4.22–5.81)

## 2013-05-29 NOTE — ED Notes (Signed)
Pt. reports bilateral legs and arms weakness for 3 weeks , ambulatory , denies fever or chills. Respirations unlabored .

## 2013-05-30 ENCOUNTER — Encounter (HOSPITAL_COMMUNITY): Payer: Self-pay | Admitting: Emergency Medicine

## 2013-05-30 DIAGNOSIS — E785 Hyperlipidemia, unspecified: Secondary | ICD-10-CM

## 2013-05-30 DIAGNOSIS — R9431 Abnormal electrocardiogram [ECG] [EKG]: Secondary | ICD-10-CM

## 2013-05-30 DIAGNOSIS — E876 Hypokalemia: Principal | ICD-10-CM

## 2013-05-30 DIAGNOSIS — M109 Gout, unspecified: Secondary | ICD-10-CM

## 2013-05-30 DIAGNOSIS — I10 Essential (primary) hypertension: Secondary | ICD-10-CM | POA: Diagnosis present

## 2013-05-30 DIAGNOSIS — N400 Enlarged prostate without lower urinary tract symptoms: Secondary | ICD-10-CM | POA: Diagnosis present

## 2013-05-30 DIAGNOSIS — N183 Chronic kidney disease, stage 3 unspecified: Secondary | ICD-10-CM

## 2013-05-30 DIAGNOSIS — R531 Weakness: Secondary | ICD-10-CM | POA: Diagnosis present

## 2013-05-30 LAB — CBC
HCT: 39 % (ref 39.0–52.0)
Hemoglobin: 12.5 g/dL — ABNORMAL LOW (ref 13.0–17.0)
MCH: 25.3 pg — ABNORMAL LOW (ref 26.0–34.0)
MCHC: 32.1 g/dL (ref 30.0–36.0)
RDW: 16.4 % — ABNORMAL HIGH (ref 11.5–15.5)

## 2013-05-30 LAB — URINALYSIS, ROUTINE W REFLEX MICROSCOPIC
Bilirubin Urine: NEGATIVE
Glucose, UA: NEGATIVE mg/dL
Ketones, ur: NEGATIVE mg/dL
Protein, ur: NEGATIVE mg/dL
Specific Gravity, Urine: 1.012 (ref 1.005–1.030)
pH: 6 (ref 5.0–8.0)

## 2013-05-30 LAB — MAGNESIUM: Magnesium: 2.3 mg/dL (ref 1.5–2.5)

## 2013-05-30 LAB — URINE MICROSCOPIC-ADD ON

## 2013-05-30 LAB — BASIC METABOLIC PANEL
CO2: 30 mEq/L (ref 19–32)
Calcium: 8.8 mg/dL (ref 8.4–10.5)
Chloride: 99 mEq/L (ref 96–112)
GFR calc Af Amer: 54 mL/min — ABNORMAL LOW (ref >90)
Sodium: 140 mEq/L (ref 135–145)

## 2013-05-30 LAB — CK: Total CK: 292 U/L — ABNORMAL HIGH (ref 7–232)

## 2013-05-30 LAB — CREATININE, SERUM: Creatinine, Ser: 1.48 mg/dL — ABNORMAL HIGH (ref 0.50–1.35)

## 2013-05-30 LAB — PHOSPHORUS: Phosphorus: 2.4 mg/dL (ref 2.3–4.6)

## 2013-05-30 MED ORDER — POTASSIUM CHLORIDE 10 MEQ/100ML IV SOLN
10.0000 meq | Freq: Once | INTRAVENOUS | Status: AC
  Administered 2013-05-30: 10 meq via INTRAVENOUS
  Filled 2013-05-30: qty 100

## 2013-05-30 MED ORDER — ONDANSETRON HCL 4 MG PO TABS: 4.0000 mg | ORAL_TABLET | Freq: Four times a day (QID) | ORAL | Status: DC | PRN

## 2013-05-30 MED ORDER — INDOMETHACIN 50 MG PO CAPS
50.0000 mg | ORAL_CAPSULE | Freq: Three times a day (TID) | ORAL | Status: AC
  Administered 2013-05-30 (×2): 50 mg via ORAL
  Filled 2013-05-30 (×2): qty 1

## 2013-05-30 MED ORDER — OXYCODONE-ACETAMINOPHEN 5-325 MG PO TABS
2.0000 | ORAL_TABLET | Freq: Once | ORAL | Status: AC
  Administered 2013-05-30: 2 via ORAL
  Filled 2013-05-30: qty 2

## 2013-05-30 MED ORDER — ZOLPIDEM TARTRATE 5 MG PO TABS: 5.0000 mg | ORAL_TABLET | Freq: Every evening | ORAL | Status: DC | PRN

## 2013-05-30 MED ORDER — COLCHICINE 0.6 MG PO TABS
1.2000 mg | ORAL_TABLET | Freq: Once | ORAL | Status: AC
  Administered 2013-05-30: 1.2 mg via ORAL
  Filled 2013-05-30: qty 2

## 2013-05-30 MED ORDER — INDOMETHACIN 50 MG PO CAPS: 50.0000 mg | ORAL_CAPSULE | Freq: Three times a day (TID) | ORAL | Status: DC

## 2013-05-30 MED ORDER — ENOXAPARIN SODIUM 40 MG/0.4ML ~~LOC~~ SOLN
40.0000 mg | Freq: Every day | SUBCUTANEOUS | Status: DC
  Administered 2013-05-30 – 2013-05-31 (×2): 40 mg via SUBCUTANEOUS
  Filled 2013-05-30 (×2): qty 0.4

## 2013-05-30 MED ORDER — OXYCODONE-ACETAMINOPHEN 5-325 MG PO TABS
2.0000 | ORAL_TABLET | Freq: Four times a day (QID) | ORAL | Status: DC | PRN
  Administered 2013-05-30 – 2013-05-31 (×2): 2 via ORAL
  Filled 2013-05-30 (×2): qty 2

## 2013-05-30 MED ORDER — POTASSIUM CHLORIDE ER 10 MEQ PO TBCR: 10.0000 meq | EXTENDED_RELEASE_TABLET | Freq: Every day | ORAL | Status: DC

## 2013-05-30 MED ORDER — SODIUM CHLORIDE 0.9 % IV SOLN: INTRAVENOUS | Status: DC

## 2013-05-30 MED ORDER — POTASSIUM CHLORIDE CRYS ER 20 MEQ PO TBCR
40.0000 meq | EXTENDED_RELEASE_TABLET | Freq: Once | ORAL | Status: AC
  Administered 2013-05-30: 40 meq via ORAL
  Filled 2013-05-30: qty 2

## 2013-05-30 MED ORDER — POTASSIUM CHLORIDE CRYS ER 20 MEQ PO TBCR
40.0000 meq | EXTENDED_RELEASE_TABLET | ORAL | Status: AC
  Administered 2013-05-30 (×4): 40 meq via ORAL
  Filled 2013-05-30 (×3): qty 2

## 2013-05-30 MED ORDER — ONDANSETRON HCL 4 MG/2ML IJ SOLN: 4.0000 mg | Freq: Three times a day (TID) | INTRAMUSCULAR | Status: AC | PRN

## 2013-05-30 MED ORDER — AMLODIPINE BESYLATE 10 MG PO TABS
10.0000 mg | ORAL_TABLET | Freq: Every day | ORAL | Status: DC
  Administered 2013-05-30 – 2013-05-31 (×2): 10 mg via ORAL
  Filled 2013-05-30 (×2): qty 1

## 2013-05-30 MED ORDER — TAMSULOSIN HCL 0.4 MG PO CAPS
0.4000 mg | ORAL_CAPSULE | Freq: Every day | ORAL | Status: DC
  Administered 2013-05-30: 0.4 mg via ORAL
  Filled 2013-05-30 (×2): qty 1

## 2013-05-30 MED ORDER — MAGNESIUM SULFATE 40 MG/ML IJ SOLN
2.0000 g | INTRAMUSCULAR | Status: AC
  Administered 2013-05-30: 2 g via INTRAVENOUS
  Filled 2013-05-30: qty 50

## 2013-05-30 MED ORDER — ALUM & MAG HYDROXIDE-SIMETH 200-200-20 MG/5ML PO SUSP: 30.0000 mL | Freq: Four times a day (QID) | ORAL | Status: DC | PRN

## 2013-05-30 MED ORDER — OXYCODONE-ACETAMINOPHEN 5-325 MG PO TABS
2.0000 | ORAL_TABLET | ORAL | Status: DC | PRN
Start: 2013-05-30 — End: 2013-06-01

## 2013-05-30 MED ORDER — ONDANSETRON HCL 4 MG/2ML IJ SOLN: 4.0000 mg | Freq: Four times a day (QID) | INTRAMUSCULAR | Status: DC | PRN

## 2013-05-30 MED ORDER — SODIUM CHLORIDE 0.9 % IJ SOLN
3.0000 mL | Freq: Two times a day (BID) | INTRAMUSCULAR | Status: DC
  Administered 2013-05-31: 3 mL via INTRAVENOUS

## 2013-05-30 MED ORDER — POTASSIUM CHLORIDE 10 MEQ/100ML IV SOLN
10.0000 meq | INTRAVENOUS | Status: AC
  Administered 2013-05-30 (×4): 10 meq via INTRAVENOUS
  Filled 2013-05-30 (×4): qty 100

## 2013-05-30 MED ORDER — FINASTERIDE 5 MG PO TABS
5.0000 mg | ORAL_TABLET | Freq: Every day | ORAL | Status: DC
Start: 2013-05-30 — End: 2013-05-31
  Administered 2013-05-30 – 2013-05-31 (×2): 5 mg via ORAL
  Filled 2013-05-30 (×2): qty 1

## 2013-05-30 NOTE — ED Notes (Signed)
Report called to [place], Collie Siad

## 2013-05-30 NOTE — ED Notes (Signed)
MD at bedside. 

## 2013-05-30 NOTE — ED Notes (Signed)
MD at Bedside.

## 2013-05-30 NOTE — Progress Notes (Signed)
Pt arrived to unit a&o x 3, drowsy/weak, accompanied by NT on tele in ED stretcher. Placed on tele box #11, Kennyth Lose with CMT notified of pt arrival. Will continue to monitor.

## 2013-05-30 NOTE — H&P (Signed)
[facility] History and Physical  [name] [id number] DOB: Jun 03, 1961 DOA: 05/29/2013  Referring physician: EDP PCP: Myriam Jacobson, MD   Chief Complaint: weakness  HPI: [name] is a 52 y.o. male with progressive weakness over the past few weeks.  Fell at work and unable to get up, or work the [employer].  Also leg cramps. Takes thiazide. Denies EtOH.  Had been on potassium pills in the past, not recently.  Potassium in ED 2.4.  QTc 500 ms.  No palpitaions.    Also c/o gout in right ankle  Review of Systems:   Past Medical History  Diagnosis Date  . Hyperlipidemia   . Hypertension   . Obesity   . Chest pain   . Enlarged prostate     followed by Dr. Serita Butcher  . Gout   . BPH (benign prostatic hyperplasia)    Past Surgical History  Procedure Laterality Date  . Cardiac catheterization      Dr. Einar Gip performed cath   Social History:  reports that he has never smoked. He does not have any smokeless tobacco history on file. He reports that he does not drink alcohol or use illicit drugs.  Allergies  Allergen Reactions  . Penicillins Nausea And Vomiting and Rash    Family History  Problem Relation Age of Onset  . Cirrhosis Father   . Other Mother     shot and killed     Prior to Admission medications   Medication Sig Start Date End Date Taking? Authorizing Provider  amLODipine (NORVASC) 10 MG tablet Take 10 mg by mouth every morning.    Yes Historical Provider, MD  chlorthalidone (HYGROTON) 25 MG tablet Take 25 mg by mouth daily.   Yes Historical Provider, MD  finasteride (PROSCAR) 5 MG tablet Take 5 mg by mouth daily.   Yes Historical Provider, MD  rosuvastatin (CRESTOR) 20 MG tablet Take 20 mg by mouth every other day.    Yes Historical Provider, MD  tamsulosin (FLOMAX) 0.4 MG CAPS capsule Take 0.4 mg by mouth daily after supper.    Yes Historical Provider, MD  oxyCODONE-acetaminophen (PERCOCET/ROXICET) 5-325 MG per tablet Take 2 tablets by  mouth every 4 (four) hours as needed for severe pain. 05/30/13   Johnna Acosta, MD  potassium chloride (K-DUR) 10 MEQ tablet Take 1 tablet (10 mEq total) by mouth daily. 05/30/13   Johnna Acosta, MD   Physical Exam: Filed Vitals:   05/30/13 0832  BP: 129/85  Pulse: 77  Temp: 99.3 F (37.4 C)  Resp: 19    BP 129/85  Pulse 77  Temp(Src) 99.3 F (37.4 C) (Oral)  Resp 19  Ht 5\' 11"  (1.803 m)  Wt 123.242 kg (271 lb 11.2 oz)  BMI 37.91 kg/m2  SpO2 95%  BP 115/73  Pulse 71  Temp(Src) 98.4 F (36.9 C) (Oral)  Resp 18  Ht 5\' 11"  (1.803 m)  Wt 124.104 kg (273 lb 9.6 oz)  BMI 38.18 kg/m2  SpO2 98%  General Appearance:    Asleep. arousable,  no distress, appears stated age  Head:    Normocephalic, without obvious abnormality, atraumatic  Eyes:    PERRL, conjunctiva/corneas clear, EOM's intact, fundi    benign, both eyes          Nose:   Nares normal, septum midline, mucosa normal, no drainage   or sinus tenderness  Throat:   Lips, mucosa, and tongue normal; teeth and gums normal  Neck:  Supple, symmetrical, trachea midline, no adenopathy;       thyroid:  No enlargement/tenderness/nodules; no carotid   bruit or JVD  Back:     Symmetric, no curvature, ROM normal, no CVA tenderness  Lungs:     Clear to auscultation bilaterally, respirations unlabored  Chest wall:    No tenderness or deformity  Heart:    Regular rate and rhythm, S1 and S2 normal, no murmur, rub   or gallop  Abdomen:     Soft, non-tender, bowel sounds active all four quadrants,    no masses, no organomegaly  Genitalia:   deferred  Rectal:    deferred  Extremities:   Extremities normal, atraumatic, no cyanosis. Mild right ankle edema with tenderness. No warmth.  Pulses:   2+ and symmetric all extremities  Skin:   Skin color, texture, turgor normal, no rashes or lesions  Lymph nodes:   Cervical, supraclavicular, and axillary nodes normal  Neurologic:   CNII-XII intact. Normal strength, sensation and reflexes       throughout             Labs on Admission:  Basic Metabolic Panel:  Recent Labs Lab 05/29/13 2214 05/30/13 0509  NA 145 140  K 2.4* 2.6*  CL 101 99  CO2 33* 30  GLUCOSE 107* 110*  BUN 17 16  CREATININE 1.77* 1.63*  CALCIUM 8.9 8.8   Liver Function Tests:  Recent Labs Lab 05/29/13 2214  AST 14  ALT 10  ALKPHOS 76  BILITOT 0.6  PROT 7.3  ALBUMIN 3.6   No results found for this basename: LIPASE, AMYLASE,  in the last 168 hours No results found for this basename: AMMONIA,  in the last 168 hours CBC:  Recent Labs Lab 05/29/13 2214  WBC 8.8  NEUTROABS 4.7  HGB 12.9*  HCT 40.8  MCV 80.6  PLT 284   Cardiac Enzymes:  Recent Labs Lab 05/30/13 0029  CKTOTAL 292*    BNP (last 3 results) No results found for this basename: PROBNP,  in the last 8760 hours CBG: No results found for this basename: GLUCAP,  in the last 168 hours  Radiological Exams on Admission: No results found.  EKG: Sinus rhythm Probable left atrial enlargement Abnormal R-wave progression, early transition Borderline T wave abnormalities Prolonged QT interval since last tracing no significant change other than QT prolonged from 450 to 500  Assessment/Plan Principal Problem:   Hypokalemia, severe. Replete IV and PO. Check mag, and phos. Likely from thiazide. telemetry Active Problems:   Weakness generalized secondary to above   Prolonged QT interval: monitor on tele   Other and unspecified hyperlipidemia   Essential hypertension, benign: stop Thiazide   BPH (benign prostatic hypertrophy)   Acute gout, mild, left ankle: 2 doses colchicine and 2 doses indocin   CKD (chronic kidney disease) stage 3, GFR 30-59 ml/min  Code Status: full Family Communication: none Disposition Plan: home  Time spent: 60 min  [facility] Pager [phone]

## 2013-05-30 NOTE — ED Provider Notes (Addendum)
CSN: [id number]     Arrival date & time 05/29/13  2204 History   First MD Initiated Contact with Patient 05/29/13 2332     Chief Complaint  Patient presents with  . Extremity Weakness   (Consider location/radiation/quality/duration/timing/severity/associated sxs/prior Treatment) HPI Comments: 52 year old male with a history of hypertension, hyperlipidemia, gout and prosthetic hypertrophy who presents with a complaint of weakness. He states that he has had a gradual onset of upper and lower extremity weakness that started sometime in the last 2 weeks though he is unable to pinpoint the exact started his weakness. He does have associated joint pain with this including his right hip and his right ankle, with his history of gout he feels that this is not unusual but the weakness is more concerning for him. 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 He denies any changes in his vision, difficulty with coordination, numbness or tingling, his wife states that he has had a couple of episodes of slurred speech although a couple of months but these resolved spontaneously and are not associated with any alcohol use. The weakness was particularly severe today and he had to go home because of that. He denies dysuria, diarrhea, hematochezia, nausea or vomiting. He has only had one meal today.  Patient is a 52 y.o. male presenting with extremity weakness. The history is provided by the patient.  Extremity Weakness    Past Medical History  Diagnosis Date  . Hyperlipidemia   . Hypertension   . Obesity   . Chest pain   . Enlarged prostate     followed by Dr. Serita Butcher  . Gout   . BPH (benign prostatic hyperplasia)    Past Surgical History  Procedure Laterality Date  . Cardiac catheterization      Dr. Einar Gip performed cath   Family History  Problem Relation Age of Onset  . Cirrhosis  Father   . Other Mother     shot and killed   History  Substance Use Topics  . Smoking status: Never Smoker   . Smokeless tobacco: Not on file  . Alcohol Use: No    Review of Systems  Musculoskeletal: Positive for extremity weakness.  All other systems reviewed and are negative.    Allergies  Nsaids and Penicillins  Home Medications   Current Outpatient Rx  Name  Route  Sig  Dispense  Refill  . amLODipine (NORVASC) 10 MG tablet   Oral   Take 10 mg by mouth every morning.          . chlorthalidone (HYGROTON) 25 MG tablet   Oral   Take 25 mg by mouth daily.         . finasteride (PROSCAR) 5 MG tablet   Oral   Take 5 mg by mouth daily.         . rosuvastatin (CRESTOR) 20 MG tablet   Oral   Take 20 mg by mouth every other day.          . tamsulosin (FLOMAX) 0.4 MG CAPS capsule   Oral   Take 0.4 mg by mouth daily after supper.          Marland Kitchen oxyCODONE-acetaminophen (PERCOCET/ROXICET) 5-325 MG per tablet   Oral   Take 2 tablets by mouth every 4 (four) hours as needed for severe pain.   6 tablet   0   .  potassium chloride (K-DUR) 10 MEQ tablet   Oral   Take 1 tablet (10 mEq total) by mouth daily.   10 tablet   0    BP 148/91  Pulse 82  Temp(Src) 98.3 F (36.8 C) (Oral)  Resp 18  Ht 5\' 11"  (1.803 m)  Wt 273 lb (123.832 kg)  BMI 38.09 kg/m2  SpO2 96% Physical Exam  Nursing note and vitals reviewed. Constitutional: He appears well-developed and well-nourished. No distress.  HENT:  Head: Normocephalic and atraumatic.  Mouth/Throat: Oropharynx is clear and moist. No oropharyngeal exudate.  Eyes: Conjunctivae and EOM are normal. Pupils are equal, round, and reactive to light. Right eye exhibits no discharge. Left eye exhibits no discharge. No scleral icterus.  Neck: Normal range of motion. Neck supple. No JVD present. No thyromegaly present.  Cardiovascular: Regular rhythm, normal heart sounds and intact distal pulses.  Exam reveals no gallop and no  friction rub.   No murmur heard. Mild tachycardia, no murmurs  Pulmonary/Chest: Effort normal and breath sounds normal. No respiratory distress. He has no wheezes. He has no rales.  Abdominal: Soft. Bowel sounds are normal. He exhibits no distension and no mass. There is no tenderness.  Musculoskeletal: Normal range of motion. He exhibits tenderness. He exhibits no edema.  No swollen joints or effusions, there is pain with range of motion of the left ankle and mild pain with right hip, able to straight leg raise bilaterally, soft compartments, though he does complain of mild pain with palpation of the bilateral quadriceps and distal upper extremities. He has inability to straighten his left elbow secondary to mild swelling and pain, wrists and hands are supple as are the knees, no obvious effusions  Lymphadenopathy:    He has no cervical adenopathy.  Neurological: He is alert. Coordination normal.  Normal strength in all 4 extremities, normal finger-nose-finger, no pronator drift, clear speech, normal memory, cranial nerves III through XII intact.  Skin: Skin is warm and dry. No rash noted. No erythema.  Psychiatric: He has a normal mood and affect. His behavior is normal.    ED Course  Procedures (including critical care time) Labs Review Labs Reviewed  CBC WITH DIFFERENTIAL - Abnormal; Notable for the following:    Hemoglobin 12.9 (*)    MCH 25.5 (*)    RDW 16.6 (*)    All other components within normal limits  COMPREHENSIVE METABOLIC PANEL - Abnormal; Notable for the following:    Potassium 2.4 (*)    CO2 33 (*)    Glucose, Bld 107 (*)    Creatinine, Ser 1.77 (*)    GFR calc non Af Amer 42 (*)    GFR calc Af Amer 49 (*)    All other components within normal limits  CK - Abnormal; Notable for the following:    Total CK 292 (*)    All other components within normal limits  URINALYSIS, ROUTINE W REFLEX MICROSCOPIC - Abnormal; Notable for the following:    Hgb urine dipstick TRACE  (*)    All other components within normal limits  BASIC METABOLIC PANEL - Abnormal; Notable for the following:    Potassium 2.6 (*)    Glucose, Bld 110 (*)    Creatinine, Ser 1.63 (*)    GFR calc non Af Amer 47 (*)    GFR calc Af Amer 54 (*)    All other components within normal limits  URINE MICROSCOPIC-ADD ON   Imaging Review No results found.  EKG Interpretation  Date/Time:  Friday May 30 2013 01:01:04 EST Ventricular Rate:  98 PR Interval:  185 QRS Duration: 98 QT Interval:  392 QTC Calculation: 500 R Axis:   13 Text Interpretation:  Sinus rhythm Probable left atrial enlargement Abnormal R-wave progression, early transition Borderline T wave abnormalities Prolonged QT interval since last tracing no significant change other than QT prolonged from 450 to 500 Confirmed by Tallula Grindle  MD, Shron Ozer ([id number]) on 05/30/2013 1:13:12 AM            MDM   1. Hypokalemia   2. Acute gouty arthritis    Would consider myositis, dermatomyositis, polymyositis, reaction secondary to his statin, doubt. Pain medication ordered, labs pending.  Pt is hypokalemia - possible source of pt's symptoms.  Replace, recheck level, EKG to evaluate for prolonged QT.  Hypokalemia treated with multiple different medications, patient's weakness has improved, again he has no objective findings of weakness, I have offered him admission to the hospital because of persistent hypokalemia though he has improved and he has declined and states he would rather go home and followup as outpatient. Medications given as below, patient appears overall stable for discharge. He has expressed his understanding for the indications for return and is aware that he can return at any time for ongoing treatment.  Patient states that pain is continual, weaknesses continual when he was to be discharged. Because of the weakness, the ongoing hypokalemia and the prolonged QT the patient will need to be admitted to the hospital for  observation and replacement of potassium.  D/w Dr. Conley Canal who will admit  Meds given in ED:  Medications  potassium chloride 10 mEq in 100 mL IVPB (10 mEq Intravenous New Bag/Given 05/30/13 0656)  oxyCODONE-acetaminophen (PERCOCET/ROXICET) 5-325 MG per tablet 2 tablet (2 tablets Oral Given 05/30/13 0009)  potassium chloride SA (K-DUR,KLOR-CON) CR tablet 40 mEq (40 mEq Oral Given 05/30/13 0101)  magnesium sulfate IVPB 2 g 50 mL (0 g Intravenous Stopped 05/30/13 0208)  potassium chloride 10 mEq in 100 mL IVPB (0 mEq Intravenous Stopped 05/30/13 0201)        Johnna Acosta, MD 05/30/13 [id number]  Johnna Acosta, MD 05/30/13 [id number]

## 2013-05-31 LAB — BASIC METABOLIC PANEL
CO2: 29 mEq/L (ref 19–32)
Calcium: 8.8 mg/dL (ref 8.4–10.5)
Creatinine, Ser: 1.49 mg/dL — ABNORMAL HIGH (ref 0.50–1.35)
Glucose, Bld: 109 mg/dL — ABNORMAL HIGH (ref 70–99)
Potassium: 3.2 mEq/L — ABNORMAL LOW (ref 3.5–5.1)
Sodium: 141 mEq/L (ref 135–145)

## 2013-05-31 MED ORDER — POTASSIUM CHLORIDE CRYS ER 20 MEQ PO TBCR
40.0000 meq | EXTENDED_RELEASE_TABLET | ORAL | Status: AC
  Administered 2013-05-31 (×2): 40 meq via ORAL
  Filled 2013-05-31 (×2): qty 2

## 2013-05-31 MED ORDER — POTASSIUM CHLORIDE CRYS ER 20 MEQ PO TBCR: 40.0000 meq | EXTENDED_RELEASE_TABLET | Freq: Two times a day (BID) | ORAL | Status: DC

## 2013-05-31 NOTE — Discharge Summary (Signed)
Physician Discharge Summary  [name] [id number] DOB: [birth year] DOA: 05/29/2013  PCP: Jonathan Jacobson, MD  Admit date: 05/29/2013 Discharge date: 06/01/2013  Time spent: 30 minutes  Recommendations for Outpatient Follow-up:  Follow up with bmp to check potassium on Monday We have stopped your BP medications as your BP parameters have been borderline. Please follow up with PCP on Monday and check BP at the office and resume new medication as per his/her discretion. Follow up with BMP in one week.   Discharge Diagnoses:  Principal Problem:   Hypokalemia Active Problems:   Weakness generalized   Prolonged QT interval   Other and unspecified hyperlipidemia   Essential hypertension, benign   BPH (benign prostatic hypertrophy)   Acute gout   CKD (chronic kidney disease) stage 3, GFR 30-59 ml/min   Discharge Condition: improved.  Diet recommendation: low sodium diet  Filed Weights   05/29/13 2208 05/30/13 0836 05/30/13 2033  Weight: 123.832 kg (273 lb) 123.242 kg (271 lb 11.2 oz) 124.104 kg (273 lb 9.6 oz)    History of present illness:  ARDI Trevino is a 52 y.o. male with progressive weakness over the past few weeks. Fell at work and unable to get up, or work the [employer]. Also leg cramps. Takes thiazide. Denies EtOH. Had been on potassium pills in the past, not recently. Potassium in ED 2.4. QTc 500 ms. No palpitaions. He was admitted to telemetry and supplemented with potassium and magnesium.    Hospital Course:  Hypokalemia: Was repleted both IV and PO. Probably from the thiazide, which was discontinued. He was discharged on potassium supplements. He was also recommended to check potassium on Monday and follow up with PCP in 1to 2 days.   Hypomagnesemia: repleted and normal on discharge.  Prolonged qt interval: Probably secondary to electrolyte abnormalities.  Resolved after supplementation.  Repeat EKG resolved.   Hypertension: Controlled.    Gout: Improved.   CKD stage 3  Stable.    Procedures:  none  Consultations:  none  Discharge Exam: Filed Vitals:   05/31/13 1222  BP: 125/93  Pulse: 106  Temp:   Resp:     General: alert afebrile comfortable Cardiovascular: s1s2 Respiratory: ctab  Discharge Instructions  Discharge Orders   Future Orders Complete By Expires   Diet - low sodium heart healthy  As directed    Discharge instructions  As directed    Comments:     FOLLOW UP with PCP on Monday Follow up with bmp to check potassium on Monday We have stopped your BP medications as your BP parameters have been borderline. Please follow up with PCP on Monday and check BP at the office and resume new medication as per his/her discretion.       Medication List    STOP taking these medications       chlorthalidone 25 MG tablet  Commonly known as:  HYGROTON      TAKE these medications       amLODipine 10 MG tablet  Commonly known as:  NORVASC  Take 10 mg by mouth every morning.     finasteride 5 MG tablet  Commonly known as:  PROSCAR  Take 5 mg by mouth daily.     oxyCODONE-acetaminophen 5-325 MG per tablet  Commonly known as:  PERCOCET/ROXICET  Take 2 tablets by mouth every 4 (four) hours as needed for severe pain.     potassium chloride SA 20 MEQ tablet  Commonly known as:  K-DUR,KLOR-CON  Take  2 tablets (40 mEq total) by mouth 2 (two) times daily.     rosuvastatin 20 MG tablet  Commonly known as:  CRESTOR  Take 20 mg by mouth every other day.     tamsulosin 0.4 MG Caps capsule  Commonly known as:  FLOMAX  Take 0.4 mg by mouth daily after supper.       Allergies  Allergen Reactions  . Penicillins Nausea And Vomiting and Rash       Follow-up Information   Follow up with Jonathan Jacobson, MD On 06/02/2013. (please check BMP on monday to check potassium. )    Specialty:  Internal Medicine   Contact information:   [address] [place] [place]  [address] [phone]        The results of significant diagnostics from this hospitalization (including imaging, microbiology, ancillary and laboratory) are listed below for reference.    Significant Diagnostic Studies: No results found.  Microbiology: No results found for this or any previous visit (from the past 240 hour(s)).   Labs: Basic Metabolic Panel:  Recent Labs Lab 05/29/13 2214 05/30/13 0509 05/30/13 1017 05/30/13 1019 05/31/13 0623  NA 145 140  --   --  141  K 2.4* 2.6*  --   --  3.2*  CL 101 99  --   --  102  CO2 33* 30  --   --  29  GLUCOSE 107* 110*  --   --  109*  BUN 17 16  --   --  16  CREATININE 1.77* 1.63* 1.48*  --  1.49*  CALCIUM 8.9 8.8  --   --  8.8  MG  --   --   --  2.3  --   PHOS  --   --   --  2.4  --    Liver Function Tests:  Recent Labs Lab 05/29/13 2214  AST 14  ALT 10  ALKPHOS 76  BILITOT 0.6  PROT 7.3  ALBUMIN 3.6   No results found for this basename: LIPASE, AMYLASE,  in the last 168 hours No results found for this basename: AMMONIA,  in the last 168 hours CBC:  Recent Labs Lab 05/29/13 2214 05/30/13 1017  WBC 8.8 7.1  NEUTROABS 4.7  --   HGB 12.9* 12.5*  HCT 40.8 39.0  MCV 80.6 78.8  PLT 284 231   Cardiac Enzymes:  Recent Labs Lab 05/30/13 0029  CKTOTAL 292*   BNP: BNP (last 3 results) No results found for this basename: PROBNP,  in the last 8760 hours CBG: No results found for this basename: GLUCAP,  in the last 168 hours     Signed:  Adleigh Mcmasters  [facility] 06/01/2013, 6:17 PM

## 2013-06-01 ENCOUNTER — Emergency Department (HOSPITAL_COMMUNITY)
Admission: EM | Admit: 2013-06-01 | Discharge: 2013-06-02 | Disposition: A | Discharge status: Home / Self Care | Payer: 59 | Attending: Emergency Medicine | Admitting: Emergency Medicine

## 2013-06-01 ENCOUNTER — Encounter (HOSPITAL_COMMUNITY): Payer: Self-pay | Admitting: Emergency Medicine

## 2013-06-01 DIAGNOSIS — M7989 Other specified soft tissue disorders: Secondary | ICD-10-CM | POA: Diagnosis not present

## 2013-06-01 DIAGNOSIS — M2550 Pain in unspecified joint: Secondary | ICD-10-CM | POA: Diagnosis not present

## 2013-06-01 DIAGNOSIS — E785 Hyperlipidemia, unspecified: Secondary | ICD-10-CM | POA: Diagnosis not present

## 2013-06-01 DIAGNOSIS — E876 Hypokalemia: Secondary | ICD-10-CM | POA: Diagnosis not present

## 2013-06-01 DIAGNOSIS — M109 Gout, unspecified: Secondary | ICD-10-CM

## 2013-06-01 DIAGNOSIS — IMO0001 Reserved for inherently not codable concepts without codable children: Secondary | ICD-10-CM | POA: Diagnosis not present

## 2013-06-01 DIAGNOSIS — E669 Obesity, unspecified: Secondary | ICD-10-CM | POA: Insufficient documentation

## 2013-06-01 DIAGNOSIS — Z79899 Other long term (current) drug therapy: Secondary | ICD-10-CM | POA: Insufficient documentation

## 2013-06-01 DIAGNOSIS — N400 Enlarged prostate without lower urinary tract symptoms: Secondary | ICD-10-CM | POA: Insufficient documentation

## 2013-06-01 DIAGNOSIS — I10 Essential (primary) hypertension: Secondary | ICD-10-CM | POA: Diagnosis not present

## 2013-06-01 DIAGNOSIS — Z880 Allergy status to penicillin: Secondary | ICD-10-CM | POA: Insufficient documentation

## 2013-06-01 LAB — CBC WITH DIFFERENTIAL/PLATELET
Basophils Absolute: 0 10*3/uL (ref 0.0–0.1)
HCT: 38.2 % — ABNORMAL LOW (ref 39.0–52.0)
Hemoglobin: 12.5 g/dL — ABNORMAL LOW (ref 13.0–17.0)
Lymphocytes Relative: 35 % (ref 12–46)
Lymphs Abs: 2.5 10*3/uL (ref 0.7–4.0)
Monocytes Absolute: 0.7 10*3/uL (ref 0.1–1.0)
Monocytes Relative: 9 % (ref 3–12)
Neutro Abs: 3.6 10*3/uL (ref 1.7–7.7)
RBC: 4.9 MIL/uL (ref 4.22–5.81)
RDW: 16.3 % — ABNORMAL HIGH (ref 11.5–15.5)
WBC: 6.9 10*3/uL (ref 4.0–10.5)

## 2013-06-01 LAB — URIC ACID: Uric Acid, Serum: 11 mg/dL — ABNORMAL HIGH (ref 4.0–7.8)

## 2013-06-01 LAB — BASIC METABOLIC PANEL
BUN: 18 mg/dL (ref 6–23)
CO2: 26 mEq/L (ref 19–32)
Chloride: 101 mEq/L (ref 96–112)
Creatinine, Ser: 1.7 mg/dL — ABNORMAL HIGH (ref 0.50–1.35)
GFR calc non Af Amer: 44 mL/min — ABNORMAL LOW (ref >90)
Glucose, Bld: 113 mg/dL — ABNORMAL HIGH (ref 70–99)

## 2013-06-01 MED ORDER — POTASSIUM CHLORIDE 10 MEQ/100ML IV SOLN
10.0000 meq | Freq: Once | INTRAVENOUS | Status: AC
  Administered 2013-06-01: 10 meq via INTRAVENOUS
  Filled 2013-06-01: qty 100

## 2013-06-01 MED ORDER — POTASSIUM CHLORIDE CRYS ER 20 MEQ PO TBCR
40.0000 meq | EXTENDED_RELEASE_TABLET | Freq: Once | ORAL | Status: AC
  Administered 2013-06-01: 40 meq via ORAL
  Filled 2013-06-01: qty 2

## 2013-06-01 MED ORDER — POTASSIUM CHLORIDE IN NACL 20-0.9 MEQ/L-% IV SOLN
INTRAVENOUS | Status: DC
  Filled 2013-06-01: qty 1000

## 2013-06-01 MED ORDER — KETOROLAC TROMETHAMINE 30 MG/ML IJ SOLN
30.0000 mg | Freq: Once | INTRAMUSCULAR | Status: AC
  Administered 2013-06-01: 30 mg via INTRAVENOUS
  Filled 2013-06-01: qty 1

## 2013-06-01 NOTE — ED Provider Notes (Signed)
CSN: [id number]     Arrival date & time 06/01/13  2134 History   First MD Initiated Contact with Patient 06/01/13 2232     Chief Complaint  Patient presents with  . Gout   HPI  History provided by the patient and recent medical chart. Patient is a 52 year old male with history of hypertension, hyperlipidemia and gout who presents with complaints of diffuse joint and body pain. Patient states that he woke up earlier in the morning with joint pain and stiffness primarily in his fingers, hands and elbows. He also reports having significant pain in his left foot and knee area. Symptoms are somewhat similar to previous gout symptoms. Patient also reports having some issues with low potassium levels recently. On a recent visit he did have hypokalemia but was given prescriptions for potassium which she has not filled. He denies having any chest pain, heart palpitations or shortness of breath. He states he has use some gentle massage which has increased mobility in his joints and fingers. He has not use any other medications for pain or symptoms today. No associated fever, chills or sweats. No rash or skin changes. No other aggravating or alleviating factors. No other associated symptoms.    Past Medical History  Diagnosis Date  . Hyperlipidemia   . Hypertension   . Obesity   . Chest pain   . Enlarged prostate     followed by Dr. Serita Butcher  . Gout   . BPH (benign prostatic hyperplasia)    Past Surgical History  Procedure Laterality Date  . Cardiac catheterization      Dr. Einar Gip performed cath   Family History  Problem Relation Age of Onset  . Cirrhosis Father   . Other Mother     shot and killed   History  Substance Use Topics  . Smoking status: Never Smoker   . Smokeless tobacco: Never Used  . Alcohol Use: No    Review of Systems  Constitutional: Negative for fever.  Gastrointestinal: Negative for nausea, vomiting, abdominal pain and diarrhea.  Musculoskeletal: Positive for  arthralgias and myalgias. Negative for back pain.  Skin: Negative for rash.  All other systems reviewed and are negative.    Allergies  Penicillins  Home Medications   Current Outpatient Rx  Name  Route  Sig  Dispense  Refill  . amLODipine (NORVASC) 10 MG tablet   Oral   Take 10 mg by mouth every morning.          . finasteride (PROSCAR) 5 MG tablet   Oral   Take 5 mg by mouth daily.         . rosuvastatin (CRESTOR) 20 MG tablet   Oral   Take 20 mg by mouth every other day.          . tamsulosin (FLOMAX) 0.4 MG CAPS capsule   Oral   Take 0.4 mg by mouth daily after supper.          . potassium chloride SA (K-DUR,KLOR-CON) 20 MEQ tablet   Oral   Take 2 tablets (40 mEq total) by mouth 2 (two) times daily.   4 tablet   0    BP 117/66  Pulse 86  Temp(Src) 98.6 F (37 C) (Oral)  Resp 18  SpO2 100% Physical Exam  Nursing note and vitals reviewed. Constitutional: He is oriented to person, place, and time. He appears well-developed and well-nourished.  HENT:  Head: Normocephalic.  Cardiovascular: Normal rate and regular rhythm.   Pulmonary/Chest: Effort  normal and breath sounds normal. No respiratory distress. He has no wheezes.  Abdominal: Soft. There is no tenderness.  Musculoskeletal:  Patient reporting pain with range of motion in joints of the digits and hands and elbow areas. No significant swelling or erythema. Normal sensations. Normal capillary refill. Normal distal pulses.  Mild edema and slight increased warmth around the knees bilaterally. There is also mild swelling and pain in the left ankle and dorsal foot area. No significant erythema. No erythematous streaks. No induration. Normal distal dorsal pedal pulses bilaterally. Normal sensations in the feet.  Neurological: He is alert and oriented to person, place, and time.  Skin: Skin is warm. No rash noted. No erythema.  Psychiatric: He has a normal mood and affect. His behavior is normal.    ED  Course  Procedures   DIAGNOSTIC STUDIES: Oxygen Saturation is 96% on room air.    COORDINATION OF CARE:  Nursing notes reviewed. Vital signs reviewed. Initial pt interview and examination performed.   11:27 PM-patient seen and evaluated. He appears in mild to moderate discomforts no significant acute distress. Does report symptoms are improving some. Now having more increased range of motion of his joints. Discussed work up plan with pt at bedside, which includes lab testing. Pt agrees with plan.  Labs show continued slight hypokalemia. Patient with significantly elevated uric acid level. His symptoms are consistent with his previous episodes of gout. At this time plan to give some IV fluids with potassium supplementation as well as Toradol for pain and inflammation.  Treatment plan initiated: Medications  0.9 % NaCl with KCl 20 mEq/ L  infusion (not administered)  ketorolac (TORADOL) 30 MG/ML injection 30 mg (not administered)  potassium chloride SA (K-DUR,KLOR-CON) CR tablet 40 mEq (not administered)  potassium chloride 10 mEq in 100 mL IVPB (not administered)   Results for orders placed during the hospital encounter of 06/01/13  CBC WITH DIFFERENTIAL      Result Value Range   WBC 6.9  4.0 - 10.5 K/uL   RBC 4.90  4.22 - 5.81 MIL/uL   Hemoglobin 12.5 (*) 13.0 - 17.0 g/dL   HCT 38.2 (*) 39.0 - 52.0 %   MCV 78.0  78.0 - 100.0 fL   MCH 25.5 (*) 26.0 - 34.0 pg   MCHC 32.7  30.0 - 36.0 g/dL   RDW 16.3 (*) 11.5 - 15.5 %   Platelets 292  150 - 400 K/uL   Neutrophils Relative % 52  43 - 77 %   Neutro Abs 3.6  1.7 - 7.7 K/uL   Lymphocytes Relative 35  12 - 46 %   Lymphs Abs 2.5  0.7 - 4.0 K/uL   Monocytes Relative 9  3 - 12 %   Monocytes Absolute 0.7  0.1 - 1.0 K/uL   Eosinophils Relative 3  0 - 5 %   Eosinophils Absolute 0.2  0.0 - 0.7 K/uL   Basophils Relative 0  0 - 1 %   Basophils Absolute 0.0  0.0 - 0.1 K/uL  BASIC METABOLIC PANEL      Result Value Range   Sodium 139  135 -  145 mEq/L   Potassium 3.0 (*) 3.5 - 5.1 mEq/L   Chloride 101  96 - 112 mEq/L   CO2 26  19 - 32 mEq/L   Glucose, Bld 113 (*) 70 - 99 mg/dL   BUN 18  6 - 23 mg/dL   Creatinine, Ser 1.70 (*) 0.50 - 1.35 mg/dL   Calcium  9.0  8.4 - 10.5 mg/dL   GFR calc non Af Amer 44 (*) >90 mL/min   GFR calc Af Amer 51 (*) >90 mL/min  URIC ACID      Result Value Range   Uric Acid, Serum 11.0 (*) 4.0 - 7.8 mg/dL       MDM   1. Arthralgia   2. Gout   3. Hypokalemia        Martie Lee, PA-C 06/02/13 [phone]

## 2013-06-01 NOTE — ED Notes (Signed)
Pt states he was in the hospital and was requested to stay but had to leave due to family issues.  Pt state he knows his potassium is low and that he is having a gout flair-up in his left leg and foot.

## 2013-06-02 DIAGNOSIS — M109 Gout, unspecified: Secondary | ICD-10-CM | POA: Diagnosis not present

## 2013-06-02 MED ORDER — SODIUM CHLORIDE 0.9 % IV BOLUS (SEPSIS)
500.0000 mL | Freq: Once | INTRAVENOUS | Status: AC
  Administered 2013-06-02: 500 mL via INTRAVENOUS

## 2013-06-02 MED ORDER — COLCHICINE 0.6 MG PO TABS: 0.6000 mg | ORAL_TABLET | Freq: Every day | ORAL | Status: DC

## 2013-06-02 MED ORDER — INDOMETHACIN 25 MG PO CAPS: 25.0000 mg | ORAL_CAPSULE | Freq: Three times a day (TID) | ORAL | Status: DC | PRN

## 2013-06-02 NOTE — ED Provider Notes (Signed)
Medical screening examination/treatment/procedure(s) were performed by non-physician practitioner and as supervising physician I was immediately available for consultation/collaboration.  EKG Interpretation   None         Osvaldo Shipper, MD 06/02/13 [phone]

## 2013-06-02 NOTE — ED Notes (Signed)
Pt finishing bolus per PA, pt has 104ml of Potassium drip as well.

## 2013-10-10 ENCOUNTER — Other Ambulatory Visit: Payer: Self-pay | Admitting: Urology

## 2013-10-21 ENCOUNTER — Other Ambulatory Visit: Payer: Self-pay | Admitting: Urology

## 2013-10-21 ENCOUNTER — Encounter (HOSPITAL_BASED_OUTPATIENT_CLINIC_OR_DEPARTMENT_OTHER): Payer: Self-pay | Admitting: *Deleted

## 2013-10-22 ENCOUNTER — Encounter (HOSPITAL_BASED_OUTPATIENT_CLINIC_OR_DEPARTMENT_OTHER): Payer: Self-pay | Admitting: *Deleted

## 2013-10-22 NOTE — Progress Notes (Addendum)
NPO AFTER MN WITH EXCEPTION WATER / GATORADE UNTIL 0700. ARRIVE AT 1145. NEEDS ISTAT 8 . CURRENT EKG IN CHART AND EPIC. WILL TAKE NORVASC AM DOS W/ SIPS OF WATER AND DO FLEET ENEMA.

## 2013-10-27 ENCOUNTER — Encounter (HOSPITAL_BASED_OUTPATIENT_CLINIC_OR_DEPARTMENT_OTHER): Payer: 59 | Admitting: Anesthesiology

## 2013-10-27 ENCOUNTER — Encounter (HOSPITAL_BASED_OUTPATIENT_CLINIC_OR_DEPARTMENT_OTHER)
Admission: RE | Disposition: A | Discharge status: Home / Self Care | Payer: Self-pay | Source: Ambulatory Visit | Attending: Urology

## 2013-10-27 ENCOUNTER — Ambulatory Visit (HOSPITAL_BASED_OUTPATIENT_CLINIC_OR_DEPARTMENT_OTHER)
Admission: RE | Admit: 2013-10-27 | Discharge: 2013-10-27 | Disposition: A | Discharge status: Home / Self Care | Payer: 59 | Source: Ambulatory Visit | Attending: Urology | Admitting: Urology

## 2013-10-27 ENCOUNTER — Ambulatory Visit (HOSPITAL_BASED_OUTPATIENT_CLINIC_OR_DEPARTMENT_OTHER): Payer: 59 | Admitting: Anesthesiology

## 2013-10-27 ENCOUNTER — Encounter (HOSPITAL_BASED_OUTPATIENT_CLINIC_OR_DEPARTMENT_OTHER): Payer: Self-pay

## 2013-10-27 DIAGNOSIS — R972 Elevated prostate specific antigen [PSA]: Secondary | ICD-10-CM

## 2013-10-27 DIAGNOSIS — N138 Other obstructive and reflux uropathy: Secondary | ICD-10-CM | POA: Insufficient documentation

## 2013-10-27 DIAGNOSIS — N183 Chronic kidney disease, stage 3 unspecified: Secondary | ICD-10-CM | POA: Insufficient documentation

## 2013-10-27 DIAGNOSIS — N401 Enlarged prostate with lower urinary tract symptoms: Secondary | ICD-10-CM | POA: Insufficient documentation

## 2013-10-27 DIAGNOSIS — R3129 Other microscopic hematuria: Secondary | ICD-10-CM | POA: Insufficient documentation

## 2013-10-27 DIAGNOSIS — C61 Malignant neoplasm of prostate: Secondary | ICD-10-CM | POA: Insufficient documentation

## 2013-10-27 DIAGNOSIS — G4733 Obstructive sleep apnea (adult) (pediatric): Secondary | ICD-10-CM | POA: Insufficient documentation

## 2013-10-27 DIAGNOSIS — E785 Hyperlipidemia, unspecified: Secondary | ICD-10-CM | POA: Insufficient documentation

## 2013-10-27 DIAGNOSIS — M109 Gout, unspecified: Secondary | ICD-10-CM | POA: Insufficient documentation

## 2013-10-27 DIAGNOSIS — I129 Hypertensive chronic kidney disease with stage 1 through stage 4 chronic kidney disease, or unspecified chronic kidney disease: Secondary | ICD-10-CM | POA: Insufficient documentation

## 2013-10-27 HISTORY — DX: Benign prostatic hyperplasia without lower urinary tract symptoms: N40.0

## 2013-10-27 HISTORY — PX: PROSTATE BIOPSY: SHX241

## 2013-10-27 HISTORY — DX: Other microscopic hematuria: R31.29

## 2013-10-27 HISTORY — DX: Nocturia: R35.1

## 2013-10-27 HISTORY — DX: Chronic kidney disease, stage 3 (moderate): N18.3

## 2013-10-27 HISTORY — DX: Prediabetes: R73.03

## 2013-10-27 HISTORY — DX: Chronic kidney disease, stage 3 unspecified: N18.30

## 2013-10-27 HISTORY — PX: CYSTOSCOPY W/ RETROGRADES: SHX1426

## 2013-10-27 HISTORY — DX: Unspecified osteoarthritis, unspecified site: M19.90

## 2013-10-27 HISTORY — DX: Presence of spectacles and contact lenses: Z97.3

## 2013-10-27 HISTORY — DX: Elevated prostate specific antigen (PSA): R97.20

## 2013-10-27 HISTORY — DX: Obstructive sleep apnea (adult) (pediatric): G47.33

## 2013-10-27 LAB — POCT I-STAT, CHEM 8
BUN: 16 mg/dL (ref 6–23)
CALCIUM ION: 1.13 mmol/L (ref 1.12–1.23)
Chloride: 106 mEq/L (ref 96–112)
Creatinine, Ser: 2.1 mg/dL — ABNORMAL HIGH (ref 0.50–1.35)
Glucose, Bld: 110 mg/dL — ABNORMAL HIGH (ref 70–99)
HCT: 44 % (ref 39.0–52.0)
Hemoglobin: 15 g/dL (ref 13.0–17.0)
Potassium: 3 mEq/L — ABNORMAL LOW (ref 3.7–5.3)
Sodium: 145 mEq/L (ref 137–147)
TCO2: 25 mmol/L (ref 0–100)

## 2013-10-27 SURGERY — BIOPSY, PROSTATE, RECTAL APPROACH, WITH US GUIDANCE
Anesthesia: General | Site: Ureter

## 2013-10-27 MED ORDER — SODIUM CHLORIDE 0.9 % IR SOLN
Status: DC | PRN
  Administered 2013-10-27: 6000 mL

## 2013-10-27 MED ORDER — KETOROLAC TROMETHAMINE 30 MG/ML IJ SOLN
INTRAMUSCULAR | Status: DC | PRN
  Administered 2013-10-27: 30 mg via INTRAVENOUS

## 2013-10-27 MED ORDER — ONDANSETRON HCL 4 MG/2ML IJ SOLN
INTRAMUSCULAR | Status: DC | PRN
  Administered 2013-10-27: 4 mg via INTRAVENOUS

## 2013-10-27 MED ORDER — ACETAMINOPHEN 10 MG/ML IV SOLN
INTRAVENOUS | Status: DC | PRN
  Administered 2013-10-27: 1000 mg via INTRAVENOUS

## 2013-10-27 MED ORDER — GENTAMICIN SULFATE 40 MG/ML IJ SOLN
480.0000 mg | Freq: Once | INTRAVENOUS | Status: AC
  Administered 2013-10-27: 480 mg via INTRAVENOUS
  Filled 2013-10-27: qty 12

## 2013-10-27 MED ORDER — GENTAMICIN SULFATE 40 MG/ML IJ SOLN
5.0000 mg/kg | INTRAVENOUS | Status: DC
  Filled 2013-10-27: qty 15.5

## 2013-10-27 MED ORDER — MEPERIDINE HCL 25 MG/ML IJ SOLN
6.2500 mg | INTRAMUSCULAR | Status: DC | PRN
  Filled 2013-10-27: qty 1

## 2013-10-27 MED ORDER — OXYCODONE HCL 5 MG PO TABS
5.0000 mg | ORAL_TABLET | Freq: Once | ORAL | Status: DC | PRN
  Filled 2013-10-27: qty 1

## 2013-10-27 MED ORDER — BELLADONNA ALKALOIDS-OPIUM 16.2-60 MG RE SUPP
RECTAL | Status: AC
  Filled 2013-10-27: qty 1

## 2013-10-27 MED ORDER — CIPROFLOXACIN IN D5W 400 MG/200ML IV SOLN
400.0000 mg | Freq: Once | INTRAVENOUS | Status: AC
  Administered 2013-10-27: 400 mg via INTRAVENOUS
  Filled 2013-10-27: qty 200

## 2013-10-27 MED ORDER — MIDAZOLAM HCL 5 MG/5ML IJ SOLN
INTRAMUSCULAR | Status: DC | PRN
  Administered 2013-10-27 (×4): 1 mg via INTRAVENOUS

## 2013-10-27 MED ORDER — IOHEXOL 350 MG/ML SOLN
INTRAVENOUS | Status: DC | PRN
  Administered 2013-10-27: 10 mL

## 2013-10-27 MED ORDER — LIDOCAINE HCL (CARDIAC) 20 MG/ML IV SOLN
INTRAVENOUS | Status: DC | PRN
  Administered 2013-10-27: 100 mg via INTRAVENOUS

## 2013-10-27 MED ORDER — FENTANYL CITRATE 0.05 MG/ML IJ SOLN
INTRAMUSCULAR | Status: DC | PRN
  Administered 2013-10-27: 25 ug via INTRAVENOUS
  Administered 2013-10-27 (×2): 12.5 ug via INTRAVENOUS
  Administered 2013-10-27: 50 ug via INTRAVENOUS
  Administered 2013-10-27 (×2): 25 ug via INTRAVENOUS
  Administered 2013-10-27 (×4): 12.5 ug via INTRAVENOUS

## 2013-10-27 MED ORDER — PROPOFOL 10 MG/ML IV BOLUS
INTRAVENOUS | Status: DC | PRN
  Administered 2013-10-27: 300 mg via INTRAVENOUS

## 2013-10-27 MED ORDER — MIDAZOLAM HCL 2 MG/2ML IJ SOLN
INTRAMUSCULAR | Status: AC
  Filled 2013-10-27: qty 6

## 2013-10-27 MED ORDER — HYDROCODONE-ACETAMINOPHEN 5-325 MG PO TABS: 1.0000 | ORAL_TABLET | ORAL | Status: DC | PRN

## 2013-10-27 MED ORDER — SENNOSIDES-DOCUSATE SODIUM 8.6-50 MG PO TABS: 1.0000 | ORAL_TABLET | Freq: Two times a day (BID) | ORAL | Status: DC

## 2013-10-27 MED ORDER — HYDROMORPHONE HCL PF 1 MG/ML IJ SOLN
0.2500 mg | INTRAMUSCULAR | Status: DC | PRN
  Filled 2013-10-27: qty 1

## 2013-10-27 MED ORDER — BUPIVACAINE HCL (PF) 0.5 % IJ SOLN
INTRAMUSCULAR | Status: DC | PRN
  Administered 2013-10-27: 10 mL

## 2013-10-27 MED ORDER — PROMETHAZINE HCL 25 MG/ML IJ SOLN
6.2500 mg | INTRAMUSCULAR | Status: DC | PRN
  Filled 2013-10-27: qty 1

## 2013-10-27 MED ORDER — LACTATED RINGERS IV SOLN
INTRAVENOUS | Status: DC | PRN
  Administered 2013-10-27 (×2): via INTRAVENOUS

## 2013-10-27 MED ORDER — ACETAMINOPHEN 10 MG/ML IV SOLN: INTRAVENOUS | Status: DC | PRN

## 2013-10-27 MED ORDER — BELLADONNA ALKALOIDS-OPIUM 16.2-60 MG RE SUPP
RECTAL | Status: DC | PRN
  Administered 2013-10-27: 1 via RECTAL

## 2013-10-27 MED ORDER — OXYCODONE HCL 5 MG/5ML PO SOLN
5.0000 mg | Freq: Once | ORAL | Status: DC | PRN
  Filled 2013-10-27: qty 5

## 2013-10-27 MED ORDER — DEXAMETHASONE SODIUM PHOSPHATE 4 MG/ML IJ SOLN
INTRAMUSCULAR | Status: DC | PRN
  Administered 2013-10-27: 10 mg via INTRAVENOUS

## 2013-10-27 MED ORDER — FLEET ENEMA 7-19 GM/118ML RE ENEM
1.0000 | ENEMA | Freq: Once | RECTAL | Status: DC
  Filled 2013-10-27: qty 1

## 2013-10-27 MED ORDER — FENTANYL CITRATE 0.05 MG/ML IJ SOLN
INTRAMUSCULAR | Status: AC
  Filled 2013-10-27: qty 12

## 2013-10-27 MED ORDER — SODIUM CHLORIDE 0.9 % IV SOLN
INTRAVENOUS | Status: DC
  Administered 2013-10-27: 13:00:00 via INTRAVENOUS
  Filled 2013-10-27: qty 1000

## 2013-10-27 MED ORDER — LIDOCAINE HCL 2 % EX GEL
CUTANEOUS | Status: DC | PRN
  Administered 2013-10-27: 1 via URETHRAL

## 2013-10-27 SURGICAL SUPPLY — 34 items
BAG DRAIN URO-CYSTO SKYTR STRL (DRAIN) ×4 IMPLANT
BAG URINE DRAINAGE (UROLOGICAL SUPPLIES) IMPLANT
BAG URINE LEG 19OZ MD ST LTX (BAG) IMPLANT
CANISTER SUCT LVC 12 LTR MEDI- (MISCELLANEOUS) ×4 IMPLANT
CATH FOLEY 3WAY 30CC 24FR (CATHETERS)
CATH HEMA 3WAY 30CC 24FR COUDE (CATHETERS) IMPLANT
CATH HEMA 3WAY 30CC 24FR RND (CATHETERS) IMPLANT
CATH URET 5FR 28IN OPEN ENDED (CATHETERS) ×4 IMPLANT
CATH URTH STD 24FR FL 3W 2 (CATHETERS) IMPLANT
CLOTH BEACON ORANGE TIMEOUT ST (SAFETY) ×4 IMPLANT
DRAPE CAMERA CLOSED 9X96 (DRAPES) ×4 IMPLANT
DRESSING TELFA 8X3 (GAUZE/BANDAGES/DRESSINGS) IMPLANT
ELECT BUTTON HF 24-28F 2 30DE (ELECTRODE) IMPLANT
ELECT LOOP MED HF 24F 12D CBL (CLIP) IMPLANT
ELECT REM PT RETURN 9FT ADLT (ELECTROSURGICAL) ×4
ELECT RESECT VAPORIZE 12D CBL (ELECTRODE) IMPLANT
ELECTRODE REM PT RTRN 9FT ADLT (ELECTROSURGICAL) ×3 IMPLANT
EVACUATOR MICROVAS BLADDER (UROLOGICAL SUPPLIES) IMPLANT
GLOVE BIO SURGEON STRL SZ7 (GLOVE) ×4 IMPLANT
GLOVE INDICATOR 7.5 STRL GRN (GLOVE) IMPLANT
GLOVE SURG SS PI 7.5 STRL IVOR (GLOVE) ×8 IMPLANT
GOWN STRL REUS W/TWL XL LVL3 (GOWN DISPOSABLE) ×8 IMPLANT
HOLDER FOLEY CATH W/STRAP (MISCELLANEOUS) IMPLANT
IV NS IRRIG 3000ML ARTHROMATIC (IV SOLUTION) ×8 IMPLANT
PACK CYSTOSCOPY (CUSTOM PROCEDURE TRAY) ×4 IMPLANT
PLUG CATH AND CAP STER (CATHETERS) IMPLANT
SET ASPIRATION TUBING (TUBING) IMPLANT
SURGILUBE 2OZ TUBE FLIPTOP (MISCELLANEOUS) ×4 IMPLANT
SYR 30ML LL (SYRINGE) IMPLANT
SYRINGE IRR TOOMEY STRL 70CC (SYRINGE) IMPLANT
TOWEL OR 17X24 6PK STRL BLUE (TOWEL DISPOSABLE) ×4 IMPLANT
UNDERPAD 30X30 INCONTINENT (UNDERPADS AND DIAPERS) ×8 IMPLANT
WATER STERILE IRR 3000ML UROMA (IV SOLUTION) IMPLANT
WATER STERILE IRR 500ML POUR (IV SOLUTION) ×4 IMPLANT

## 2013-10-27 NOTE — Anesthesia Preprocedure Evaluation (Addendum)
Anesthesia Evaluation  Patient identified by MRN, date of birth, ID band Patient awake    Reviewed: Allergy & Precautions, H&P , NPO status , Patient's Chart, lab work & pertinent test results  Airway Mallampati: III TM Distance: >3 FB Neck ROM: Full    Dental no notable dental hx. (+) Dental Advisory Given   Pulmonary sleep apnea ,  breath sounds clear to auscultation  Pulmonary exam normal       Cardiovascular hypertension, Pt. on medications Rhythm:Regular Rate:Normal     Neuro/Psych negative neurological ROS  negative psych ROS   GI/Hepatic negative GI ROS, Neg liver ROS,   Endo/Other  negative endocrine ROS  Renal/GU Renal diseaseStage 3 chronic kidney disease     Musculoskeletal negative musculoskeletal ROS (+)   Abdominal   Peds  Hematology negative hematology ROS (+)   Anesthesia Other Findings   Reproductive/Obstetrics                          Anesthesia Physical Anesthesia Plan  ASA: III  Anesthesia Plan: General   Post-op Pain Management:    Induction: Intravenous  Airway Management Planned: LMA  Additional Equipment:   Intra-op Plan:   Post-operative Plan: Extubation in OR  Informed Consent: I have reviewed the patients History and Physical, chart, labs and discussed the procedure including the risks, benefits and alternatives for the proposed anesthesia with the patient or authorized representative who has indicated his/her understanding and acceptance.   Dental advisory given  Plan Discussed with: CRNA  Anesthesia Plan Comments:         Anesthesia Quick Evaluation

## 2013-10-27 NOTE — Anesthesia Procedure Notes (Signed)
Procedure Name: LMA Insertion Date/Time: 10/27/2013 1:29 PM Performed by: Justice Rocher Pre-anesthesia Checklist: Patient identified, Emergency Drugs available, Suction available and Patient being monitored Patient Re-evaluated:Patient Re-evaluated prior to inductionOxygen Delivery Method: Circle System Utilized Preoxygenation: Pre-oxygenation with 100% oxygen Intubation Type: IV induction Ventilation: Mask ventilation without difficulty LMA: LMA inserted LMA Size: 5.0 Number of attempts: 1 Airway Equipment and Method: bite block Placement Confirmation: positive ETCO2 Tube secured with: Tape Dental Injury: Teeth and Oropharynx as per pre-operative assessment

## 2013-10-27 NOTE — Transfer of Care (Deleted)
Immediate Anesthesia Transfer of Care Note  Patient: [name]  Procedure(s) Performed: Procedure(s) (LRB): BIOPSY TRANSRECTAL ULTRASONIC PROSTATE (TUBP) (N/A) TRANSURETHRAL RESECTION OF BLADDER TUMOR WITH GYRUS (TURBT-GYRUS) (N/A)  Patient Location: PACU  Anesthesia Type: General  Level of Consciousness: awake, sedated, patient cooperative and responds to stimulation  Airway & Oxygen Therapy: Patient Spontanous Breathing and Patient connected to face mask oxygen  Post-op Assessment: Report given to PACU RN, Post -op Vital signs reviewed and stable and Patient moving all extremities  Post vital signs: Reviewed and stable  Complications: No apparent anesthesia complications

## 2013-10-27 NOTE — Op Note (Signed)
DATE OF PROCEDURE: 10/27/13    OPERATIVE REPORT   SURGEON: Rolan Bucco, MD  ASSISTANT: None.   PREOPERATIVE DIAGNOSIS: Elevated PSA. Microscopic hematuria. POSTOPERATIVE DIAGNOSIS: Same.   PROCEDURE PERFORMED:  1. Prostate biopsy. Transrectal ultrasound imaging with interpretation. Bilateral prostatic nerve block.  2. Cystoscopy. 3. Bilateral retrograde pyelograms with interpretation.  SPECIMEN: Prostate biopsy tissue.   ANESTHETIC: General anesthetic and local nerve block.   LOCAL MEDICATION: 0.5% Marcaine without epinephrine, 10 mL.  DRAINS: None.  COMPLICATIONS: None.   FINDINGS:  Negative bladder tumors. Negative hydronephrosis or filling defects bilaterally on retrograde pyelograms.  Prostate measurement: Volume: 65 cc.  Length: 5.51 cm. Height: 4.33 cm. Width: 5.23 cm.  No hypoechoic areas.  Normal seminal vesicles.  Median lobe present: Yes   COMPLICATIONS: None.   HISTORY OF PRESENT ILLNESS:  53 year old male presents today for prostate biopsy and cystoscopy with retrograde pyelogram for history of elevated PSA and microscopic hematuria.  PROCEDURE: After informed consent was obtained, the patient was taken  to the operating room where he was placed in the supine position.  General anesthesia was induced and he was placed on his side.  SCDs were in place and turned on before induction of the monitored  anesthesia care. IV antibiotics were infused. A time-out was then  performed in which the correct patient, surgical site, and procedure  were identified and agreed upon by the team.   I then performed a digital rectal examination and estimated his prostate to be >50 g in size. There were no nodules or induration noted.  Next, the rectal ultrasound probe was advanced through the anus into the rectum. The  prostate was well visualized. I scanned the entire prostate and  performed measurements (results listed above).  Next, I performed a prostatic nerve  block bilaterally by visualizing the base of the prostate with the junction of the seminal  vesicle on the right side and injecting 4 mL of 0.25% Marcaine without  epinephrine. Next, I advanced the probe to the apex of the right side  and injected 1 mL. Attention was turned to the left side at the junction of the base of the prostate and the seminal vesicle, and 4 mL were  injected here as well as 1 mL at the left apex giving a total of 10 mL  of injection. Each time before injecting, it did pull back on the  syringe to ensure I was not injecting into the vasculature.   Following this, the standard sextant biopsies were carried out by obtaining six  biopsies on each side at the lateral apex, midportion and base as well  as the medial apex, midportion, and base, bilaterally.    After this was done, there was noted to be no rectal bleeding or any other  complications. The probe was removed. This completed this portion of the procedure.  He was placed in back in a supine position, and transferred to the operating room table where he was placed in a dorsolithotomy position. His placed in stirrups making sure to pad all pertinent neurovascular pressure points.  I left the room to perform scrubbing of my hands. When I returned we performed a second surgical timeout was performed in which the correct patient, surgical site, and procedure were identified and agreed upon by the team.  SCDs were turned on and in place.   The genitals were prepped and draped in the usual sterile fashion.  A rigid cystoscope was advanced through the urethra and into the bladder.  He was noted to have trilobar hyperplasia of the prostate with a large median lobe extending into the prostate.  The bladder was drained and then fully distended. It was evaluated in a systematic fashion with a 12 and 70 lens to visualize the entire surface of the bladder. This was negative for tumors.  I obtained a left retrograde pyelogram  by cannulating the left ureter orifice with a 5 French ureter catheter and injecting 5 cc of Omnipaque. This was negative for filling defects and negative hydronephrosis. This side drained well.  I obtained a right retrograde pyelogram by cannulating the right ureter orifice with a 5 French ureter catheter and injecting 5 cc of Omnipaque. This was negative for filling defects and negative hydronephrosis. This side drained well.  The bladder was drained and the cystoscope was removed. I placed 10 cc of lidocaine jelly into the urethra and a belladonna and opium suppository into the rectum.  His placed back in a supine position, anesthesia was reversed, and he was taken to the [facility] in a stable condition.  All counts were correct at the end of the case.

## 2013-10-27 NOTE — Discharge Instructions (Signed)
DISCHARGE INSTRUCTIONS FOR PROSTATE BIOPSY  MEDICATIONS:  1. DO NOT RESUME YOUR ASPIRIN, WARFARIN, OR OTHER BLOOD THINNER FOR 1 WEEK.  2. Resume all your other meds from home, including finasteride and tamsulosin if you were taking them before.  ACTIVITY 1. No heavy lifting >10 pounds for 1 week. 2. No sexual activity for 1 week 3. No strenuous activity for 1 week 4. No driving while on narcotic pain medications 5. Drink plenty of water 6. Continue to walk at home - you can still get blood clots when you are at  home, so keep active, but don't over do it. 7. Your urine may have some blood in it - make sure you drink plenty of water,  call or come to the ER immediately if you can't urinate at all 8. No straddle activity (such as riding a bike or a horse)for 3 weeks.  BATHING 1. You can shower. Do not take baths or submerge the catheter underwater.  CATHETER (If you are discharged with a catheter.) 1. Keep your catheter secured to your leg at all times with tape. 2. You may experience leakage of urine around your catheter- as long as the  catheter continues to drain, this is normal.  If your catheter stops draining  return to the ER, but do not let anyone other than a urologist replace your  catheter. 3. You may also have blood in your urine, even after it has been clear for  several days; you may even pass some small blood clots or other material.  This  is normal as well.  If this happens, sit down and drink plenty of water to help  make urine to flush out your bladder.  If the blood in your urine becomes worse  after doing this, contact our office or return to the ER. 4. You may use the leg bag (small bag) during the day, but use the large bag at  night.    SIGNS/SYMPTOMS TO CALL: 1. Please call us if you have a fever greater than 101.5, uncontrolled  nausea/vomiting, uncontrolled pain, dizziness, unable to urinate,  chest pain, shortness of breath, leg swelling, leg pain,  or any other concerns  or questions.  You can reach [place] at [phone].    Post Anesthesia Home Care Instructions  Activity: Get plenty of rest for the remainder of the day. A responsible adult should stay with you for 24 hours following the procedure.  For the next 24 hours, DO NOT: -Drive a car -Paediatric nurse -Drink alcoholic beverages -Take any medication unless instructed by your physician -Make any legal decisions or sign important papers.  Meals: Start with liquid foods such as gelatin or soup. Progress to regular foods as tolerated. Avoid greasy, spicy, heavy foods. If nausea and/or vomiting occur, drink only clear liquids until the nausea and/or vomiting subsides. Call your physician if vomiting continues.  Special Instructions/Symptoms: Your throat may feel dry or sore from the anesthesia or the breathing tube placed in your throat during surgery. If this causes discomfort, gargle with warm salt water. The discomfort should disappear within 24 hours.

## 2013-10-27 NOTE — H&P (Signed)
Urology History and Physical Exam  CC: Microscopic hematuria. Elevated PSA.  HPI:  53 year old male presents today for microscopic hematuria and elevated PSA.  Hematuria is chronic problem.  Last cystoscopy was negative in 2010.  Last upper tract imaging with CT was in 2014 without IV contrast.  It is not associated with dysuria or gross hematuria.  His elevated PSA is also a chronic problem.  He's never had a prostate biopsy.  Negative family history of prostate cancer.  Negative prostate nodules on rectal exam.  PSA level is 1.3 but should be corrected to 2.6 for finasteride use on 10/06/13.  We discussed management options along with risks, benefits, and side effects.   He wishes to proceed with cystoscopy, bilateral retrograde grams, and possible transurethral resection of bladder tumor along with transrectal ultrasound-guided prostate biopsy.  UA 10/09/48 was negative for signs of infection.  PMH: Past Medical History  Diagnosis Date  . Hyperlipidemia   . Hypertension   . Gout     STABLE  PER PT 10-22-2013  . BPH (benign prostatic hypertrophy)   . Elevated PSA   . Microhematuria   . Borderline diabetes mellitus   . Nocturia   . Arthritis     SPINE  . Wears glasses   . CKD (chronic kidney disease), stage III   . OSA (obstructive sleep apnea)     PER PT STUDY DONE 2005 (APPROX).  NON- COMPLIANT CPAP    PSH: Past Surgical History  Procedure Laterality Date  . Transthoracic echocardiogram  07-03-2006    MILD - MODERATE LVH/  EF 55-60%/  GRADE I DIASTOIC DYSFUNCTION/  TRIVIAL  TR /  TRIVIAL PERICARDIAL EFFUSION  . Right hand surgery  YRS AGO  . Cardiac catheterization  10-04-2006   DR [facility]    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%/  2ndDIAGONAL 30%/  RCA 40%/  EF 60%  . Cardiac catheterization  04-07-2002  [facility]    NON-CRITICAL CAD/  PRESERVED LV    Allergies: Allergies  Allergen Reactions  . Penicillins Nausea And Vomiting and Rash    Medications: No prescriptions  prior to admission     Social History: History   Social History  . Marital Status: Married    Spouse Name: N/A    Number of Children: N/A  . Years of Education: N/A   Occupational History  . Not on file.   Social History Main Topics  . Smoking status: Never Smoker   . Smokeless tobacco: Never Used  . Alcohol Use: No  . Drug Use: No  . Sexual Activity: Not on file   Other Topics Concern  . Not on file   Social History Narrative  . No narrative on file    Family History: Family History  Problem Relation Age of Onset  . Cirrhosis Father   . Other Mother     shot and killed    Review of Systems: Positive: None. Negative: Fever, SOB, or chest pain.  A further 10 point review of systems was negative except what is listed in the HPI.  Physical Exam: Filed Vitals:   10/27/13 1203  BP: 145/98  Pulse: 83  Temp: 97.5 F (36.4 C)  Resp: 16    General: No acute distress.  Awake. Head:  Normocephalic.  Atraumatic. ENT:  EOMI.  Mucous membranes moist Neck:  Supple.  No lymphadenopathy. CV:  S1 present. S2 present. Regular rate. Pulmonary: Equal effort bilaterally.  Clear to auscultation bilaterally. Abdomen: Soft.  Non- tender to palpation. Skin:  Normal turgor.  No visible rash. Extremity: No gross deformity of bilateral upper extremities.  No gross deformity of    bilateral lower extremities. Neurologic: Alert. Appropriate mood.    Studies:  No results found for this basename: HGB, WBC, PLT,  in the last 72 hours  No results found for this basename: NA, K, CL, CO2, BUN, CREATININE, CALCIUM, MAGNESIUM, GFRNONAA, GFRAA,  in the last 72 hours   No results found for this basename: PT, INR, APTT,  in the last 72 hours   No components found with this basename: ABG,     Assessment:  Microscopic hematuria. Elevated PSA.  Plan: To OR for cystoscopy, bilateral retrograde grams, and possible transurethral resection of bladder tumor along with transrectal  ultrasound-guided prostate biopsy.

## 2013-10-27 NOTE — Transfer of Care (Signed)
Immediate Anesthesia Transfer of Care Note  Patient: [name]  Procedure(s) Performed: Procedure(s) (LRB): BIOPSY TRANSRECTAL ULTRASONIC PROSTATE (TUBP) (N/A) TRANSURETHRAL RESECTION OF BLADDER TUMOR WITH GYRUS (TURBT-GYRUS) (N/A)  Patient Location: PACU  Anesthesia Type: General  Level of Consciousness: awake, sedated, patient cooperative and responds to stimulation  Airway & Oxygen Therapy: Patient Spontanous Breathing and Patient connected to face mask oxygen  Post-op Assessment: Report given to PACU RN, Post -op Vital signs reviewed and stable and Patient moving all extremities  Post vital signs: Reviewed and stable  Complications: No apparent anesthesia complications

## 2013-10-28 ENCOUNTER — Encounter (HOSPITAL_BASED_OUTPATIENT_CLINIC_OR_DEPARTMENT_OTHER): Payer: Self-pay | Admitting: Urology

## 2013-10-30 NOTE — Anesthesia Postprocedure Evaluation (Signed)
Anesthesia Post Note  Patient: [name]  Procedure(s) Performed: Procedure(s) (LRB): BIOPSY TRANSRECTAL ULTRASONIC PROSTATE (TUBP) (N/A) CYSTOSCOPY WITH RETROGRADE PYELOGRAM (Bilateral)  Anesthesia type: General  Patient location: PACU  Post pain: Pain level controlled  Post assessment: Post-op Vital signs reviewed  Last Vitals: BP 146/100  Pulse 82  Temp(Src) 36.7 C (Oral)  Resp 16  Ht 5\' 11"  (1.803 m)  Wt 287 lb (130.182 kg)  BMI 40.05 kg/m2  SpO2 94%  Post vital signs: Reviewed  Level of consciousness: sedated  Complications: No apparent anesthesia complications

## 2013-10-31 ENCOUNTER — Other Ambulatory Visit (HOSPITAL_COMMUNITY): Payer: Self-pay | Admitting: Urology

## 2013-10-31 ENCOUNTER — Ambulatory Visit (HOSPITAL_COMMUNITY)
Admission: RE | Admit: 2013-10-31 | Discharge: 2013-10-31 | Disposition: A | Discharge status: Home / Self Care | Payer: 59 | Source: Ambulatory Visit | Attending: Urology | Admitting: Urology

## 2013-10-31 DIAGNOSIS — C61 Malignant neoplasm of prostate: Secondary | ICD-10-CM

## 2013-11-04 ENCOUNTER — Encounter (HOSPITAL_COMMUNITY): Payer: Self-pay | Admitting: Emergency Medicine

## 2013-11-04 ENCOUNTER — Emergency Department (HOSPITAL_COMMUNITY)
Admission: EM | Admit: 2013-11-04 | Discharge: 2013-11-04 | Disposition: A | Discharge status: Home / Self Care | Payer: 59 | Attending: Emergency Medicine | Admitting: Emergency Medicine

## 2013-11-04 DIAGNOSIS — M109 Gout, unspecified: Secondary | ICD-10-CM | POA: Insufficient documentation

## 2013-11-04 DIAGNOSIS — M25559 Pain in unspecified hip: Secondary | ICD-10-CM | POA: Insufficient documentation

## 2013-11-04 DIAGNOSIS — Z880 Allergy status to penicillin: Secondary | ICD-10-CM | POA: Insufficient documentation

## 2013-11-04 DIAGNOSIS — M129 Arthropathy, unspecified: Secondary | ICD-10-CM | POA: Insufficient documentation

## 2013-11-04 DIAGNOSIS — N183 Chronic kidney disease, stage 3 unspecified: Secondary | ICD-10-CM | POA: Insufficient documentation

## 2013-11-04 DIAGNOSIS — Z9889 Other specified postprocedural states: Secondary | ICD-10-CM | POA: Insufficient documentation

## 2013-11-04 DIAGNOSIS — Z7982 Long term (current) use of aspirin: Secondary | ICD-10-CM | POA: Insufficient documentation

## 2013-11-04 DIAGNOSIS — I10 Essential (primary) hypertension: Secondary | ICD-10-CM | POA: Insufficient documentation

## 2013-11-04 DIAGNOSIS — N400 Enlarged prostate without lower urinary tract symptoms: Secondary | ICD-10-CM | POA: Insufficient documentation

## 2013-11-04 DIAGNOSIS — M10072 Idiopathic gout, left ankle and foot: Secondary | ICD-10-CM

## 2013-11-04 DIAGNOSIS — G4733 Obstructive sleep apnea (adult) (pediatric): Secondary | ICD-10-CM | POA: Insufficient documentation

## 2013-11-04 DIAGNOSIS — M79652 Pain in left thigh: Secondary | ICD-10-CM

## 2013-11-04 DIAGNOSIS — I129 Hypertensive chronic kidney disease with stage 1 through stage 4 chronic kidney disease, or unspecified chronic kidney disease: Secondary | ICD-10-CM | POA: Insufficient documentation

## 2013-11-04 DIAGNOSIS — Z79899 Other long term (current) drug therapy: Secondary | ICD-10-CM | POA: Insufficient documentation

## 2013-11-04 LAB — BASIC METABOLIC PANEL
BUN: 16 mg/dL (ref 6–23)
CO2: 24 meq/L (ref 19–32)
CREATININE: 2.03 mg/dL — AB (ref 0.50–1.35)
Calcium: 8.3 mg/dL — ABNORMAL LOW (ref 8.4–10.5)
Chloride: 104 mEq/L (ref 96–112)
GFR calc non Af Amer: 35 mL/min — ABNORMAL LOW (ref >90)
GFR, EST AFRICAN AMERICAN: 41 mL/min — AB (ref >90)
Glucose, Bld: 126 mg/dL — ABNORMAL HIGH (ref 70–99)
POTASSIUM: 3.4 meq/L — AB (ref 3.7–5.3)
Sodium: 140 mEq/L (ref 137–147)

## 2013-11-04 LAB — CBC WITH DIFFERENTIAL/PLATELET
BASOS ABS: 0 10*3/uL (ref 0.0–0.1)
BASOS PCT: 0 % (ref 0–1)
Eosinophils Absolute: 0.2 10*3/uL (ref 0.0–0.7)
Eosinophils Relative: 2 % (ref 0–5)
HCT: 38.6 % — ABNORMAL LOW (ref 39.0–52.0)
Hemoglobin: 12.3 g/dL — ABNORMAL LOW (ref 13.0–17.0)
Lymphocytes Relative: 25 % (ref 12–46)
Lymphs Abs: 2.4 10*3/uL (ref 0.7–4.0)
MCH: 25.3 pg — ABNORMAL LOW (ref 26.0–34.0)
MCHC: 31.9 g/dL (ref 30.0–36.0)
MCV: 79.3 fL (ref 78.0–100.0)
MONOS PCT: 10 % (ref 3–12)
Monocytes Absolute: 1 10*3/uL (ref 0.1–1.0)
NEUTROS ABS: 5.9 10*3/uL (ref 1.7–7.7)
NEUTROS PCT: 63 % (ref 43–77)
Platelets: 239 10*3/uL (ref 150–400)
RBC: 4.87 MIL/uL (ref 4.22–5.81)
RDW: 16.8 % — ABNORMAL HIGH (ref 11.5–15.5)
WBC: 9.5 10*3/uL (ref 4.0–10.5)

## 2013-11-04 MED ORDER — COLCHICINE 0.6 MG PO TABS
0.6000 mg | ORAL_TABLET | Freq: Once | ORAL | Status: AC
Start: 2013-11-04 — End: 2013-11-04
  Administered 2013-11-04: 0.6 mg via ORAL
  Filled 2013-11-04: qty 1

## 2013-11-04 MED ORDER — OXYCODONE-ACETAMINOPHEN 5-325 MG PO TABS: 1.0000 | ORAL_TABLET | Freq: Once | ORAL | Status: DC

## 2013-11-04 MED ORDER — OXYCODONE-ACETAMINOPHEN 5-325 MG PO TABS
1.0000 | ORAL_TABLET | Freq: Once | ORAL | Status: AC
  Administered 2013-11-04: 1 via ORAL
  Filled 2013-11-04: qty 1

## 2013-11-04 MED ORDER — PREDNISONE 20 MG PO TABS
60.0000 mg | ORAL_TABLET | Freq: Once | ORAL | Status: AC
  Administered 2013-11-04: 60 mg via ORAL
  Filled 2013-11-04: qty 3

## 2013-11-04 MED ORDER — PREDNISONE 20 MG PO TABS: 60.0000 mg | ORAL_TABLET | Freq: Once | ORAL | Status: DC

## 2013-11-04 MED ORDER — ENOXAPARIN SODIUM 150 MG/ML ~~LOC~~ SOLN
1.0000 mg/kg | Freq: Once | SUBCUTANEOUS | Status: AC
  Administered 2013-11-04: 130 mg via SUBCUTANEOUS
  Filled 2013-11-04: qty 1

## 2013-11-04 MED ORDER — COLCHICINE 0.6 MG PO TABS: 0.6000 mg | ORAL_TABLET | Freq: Two times a day (BID) | ORAL | Status: DC | PRN

## 2013-11-04 NOTE — Progress Notes (Signed)
*  Preliminary Results* Left lower extremity venous duplex completed. Left lower extremity is negative for deep vein thrombosis. There is no evidence of left Baker's cyst.  Preliminary results discussed with Abigail, PA.  11/04/2013 11:16 AM  Maudry Mayhew, RVT, RDCS, RDMS

## 2013-11-04 NOTE — ED Notes (Signed)
Spoke with vascular tech - reports scan with negative for DVT.

## 2013-11-04 NOTE — Discharge Instructions (Signed)

## 2013-11-04 NOTE — ED Provider Notes (Signed)
CSN: [id number]     Arrival date & time 11/04/13  0102 History   First MD Initiated Contact with Patient 11/04/13 [phone]     Chief Complaint  Patient presents with  . Leg Pain     (Consider location/radiation/quality/duration/timing/severity/associated sxs/prior Treatment) Patient is a 53 y.o. male presenting with leg pain. The history is provided by the patient.  Leg Pain He  Complains of pain in his left foot and ankle and left thigh which started yesterday. He states the pain is severe and he rates it a 9/10. It is worse with standing and movement. He states his left thigh feels heavy. He denies any trauma. He does have history of gout and he states that the pain in his foot and ankle are similar to what his gout feels like. He had been taking colchicine once a day but ran out about 3 weeks ago and could not get the prescription refilled. Also, of note, he had a prostate biopsy done about one week ago and was found to have prostate cancer.   Past Medical History  Diagnosis Date  . Hyperlipidemia   . Hypertension   . Gout     STABLE  PER PT 10-22-2013  . BPH (benign prostatic hypertrophy)   . Elevated PSA   . Microhematuria   . Borderline diabetes mellitus   . Nocturia   . Arthritis     SPINE  . Wears glasses   . CKD (chronic kidney disease), stage III   . OSA (obstructive sleep apnea)     PER PT STUDY DONE 2005 (APPROX).  NON- COMPLIANT CPAP   Past Surgical History  Procedure Laterality Date  . Transthoracic echocardiogram  07-03-2006    MILD - MODERATE LVH/  EF 55-60%/  GRADE I DIASTOIC DYSFUNCTION/  TRIVIAL  TR /  TRIVIAL PERICARDIAL EFFUSION  . Right hand surgery  YRS AGO  . Cardiac catheterization  10-04-2006   DR [facility]    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%/  2ndDIAGONAL 30%/  RCA 40%/  EF 60%  . Cardiac catheterization  04-07-2002  DR [facility]    NON-CRITICAL CAD/  PRESERVED LV  . Prostate biopsy N/A 10/27/2013    Procedure: BIOPSY TRANSRECTAL ULTRASONIC PROSTATE (TUBP);   Surgeon: Molli Hazard, MD;  Location: [facility];  Service: Urology;  Laterality: N/A;  . Cystoscopy w/ retrogrades Bilateral 10/27/2013    Procedure: CYSTOSCOPY WITH RETROGRADE PYELOGRAM;  Surgeon: Molli Hazard, MD;  Location: [facility];  Service: Urology;  Laterality: Bilateral;   Family History  Problem Relation Age of Onset  . Cirrhosis Father   . Other Mother     shot and killed   History  Substance Use Topics  . Smoking status: Never Smoker   . Smokeless tobacco: Never Used  . Alcohol Use: No    Review of Systems  All other systems reviewed and are negative.     Allergies  Penicillins  Home Medications   Prior to Admission medications   Medication Sig Start Date End Date Taking? Authorizing Provider  amLODipine (NORVASC) 10 MG tablet Take 10 mg by mouth every morning.    Yes Historical Provider, MD  aspirin EC 81 MG tablet Take 81 mg by mouth daily.   Yes Historical Provider, MD  colchicine 0.6 MG tablet Take 1 tablet (0.6 mg total) by mouth daily. 06/02/13  Yes Peter S Dammen, PA-C  finasteride (PROSCAR) 5 MG tablet Take 5 mg by mouth daily.   Yes Historical Provider,  MD  HYDROcodone-acetaminophen (NORCO/VICODIN) 5-325 MG per tablet Take 1-2 tablets by mouth every 4 (four) hours as needed for moderate pain. 10/27/13  Yes Sharyn Creamer, MD  potassium chloride SA (K-DUR,KLOR-CON) 20 MEQ tablet Take 20 mEq by mouth every other day. 05/31/13  Yes Hosie Poisson, MD  senna-docusate (SENOKOT S) 8.6-50 MG per tablet Take 1 tablet by mouth 2 (two) times daily. 10/27/13  Yes Sharyn Creamer, MD  tamsulosin (FLOMAX) 0.4 MG CAPS capsule Take 0.4 mg by mouth daily after supper.    Yes Historical Provider, MD   BP 150/94  Pulse 104  Temp(Src) 100.3 F (37.9 C) (Oral)  Resp 20  Ht 5\' 11"  (1.803 m)  Wt 285 lb (129.275 kg)  BMI 39.77 kg/m2  SpO2 97% Physical Exam  Nursing note and vitals reviewed.  53 year old male, who  appears uncomfortable, but is in no acute distress. Vital signs are  Significant for tachycardia with heart rate of 104, and hypertension with blood pressure 150/94. He does not have a fever technically, but temperature is borderline at 100.3. Oxygen saturation is 97%, which is normal. Head is normocephalic and atraumatic. PERRLA, EOMI. Oropharynx is clear. Neck is nontender and supple without adenopathy or JVD. Back is nontender and there is no CVA tenderness. Lungs are clear without rales, wheezes, or rhonchi. Chest is nontender. Heart has regular rate and rhythm without murmur. Abdomen is soft, flat, nontender without masses or hepatosplenomegaly and peristalsis is normoactive. Extremities have no cyanosis or edema. There is mild swelling and tenderness of the left foot and ankle with mild warmth. This is consistent with exacerbation of gout. There is mild tenderness to palpation of the left thigh and moderate tenderness in the left inguinal area. There is no increased calf or thigh circumference on the left side relative to the right. Distal neurovascular exam is intact with normal sensation, prompt capillary refill, and strong distal pulses. Skin is warm and dry without rash. Neurologic: Mental status is normal, cranial nerves are intact, there are no motor or sensory deficits.  ED Course  Procedures (including critical care time) Labs Review Results for orders placed during the hospital encounter of 11/04/13  CBC WITH DIFFERENTIAL      Result Value Ref Range   WBC 9.5  4.0 - 10.5 K/uL   RBC 4.87  4.22 - 5.81 MIL/uL   Hemoglobin 12.3 (*) 13.0 - 17.0 g/dL   HCT 38.6 (*) 39.0 - 52.0 %   MCV 79.3  78.0 - 100.0 fL   MCH 25.3 (*) 26.0 - 34.0 pg   MCHC 31.9  30.0 - 36.0 g/dL   RDW 16.8 (*) 11.5 - 15.5 %   Platelets 239  150 - 400 K/uL   Neutrophils Relative % 63  43 - 77 %   Neutro Abs 5.9  1.7 - 7.7 K/uL   Lymphocytes Relative 25  12 - 46 %   Lymphs Abs 2.4  0.7 - 4.0 K/uL   Monocytes  Relative 10  3 - 12 %   Monocytes Absolute 1.0  0.1 - 1.0 K/uL   Eosinophils Relative 2  0 - 5 %   Eosinophils Absolute 0.2  0.0 - 0.7 K/uL   Basophils Relative 0  0 - 1 %   Basophils Absolute 0.0  0.0 - 0.1 K/uL  BASIC METABOLIC PANEL      Result Value Ref Range   Sodium 140  137 - 147 mEq/L   Potassium 3.4 (*) 3.7 -  5.3 mEq/L   Chloride 104  96 - 112 mEq/L   CO2 24  19 - 32 mEq/L   Glucose, Bld 126 (*) 70 - 99 mg/dL   BUN 16  6 - 23 mg/dL   Creatinine, Ser 2.03 (*) 0.50 - 1.35 mg/dL   Calcium 8.3 (*) 8.4 - 10.5 mg/dL   GFR calc non Af Amer 35 (*) >90 mL/min   GFR calc Af Amer 41 (*) >90 mL/min   MDM   Final diagnoses:  Pain of left thigh  Acute idiopathic gout of left ankle    Left foot, ankle, thigh pain. Foot and ankle pain seem consistent with gout. Thigh pain is not in a location that one would expect to see for gout. However, with the recent surgery and with diagnosis of prostate cancer, need to consider possible DVT. I do not see any skin changes to suggest cellulitis. Old records are reviewed and he has prior ED visits for gout. He is given a dose of oxycodone and acetaminophen for pain, and screening labs are obtained. He does have renal insufficiency as seen on recent metabolic panels and also had hypokalemia. CBC and metabolic panel will be checked. He is given a dose of prednisone and colchicine for gout and is given a dose of enoxaparin for possible DVT. He will be held in the ED overnight to have a venous Doppler done in the morning. Case is signed out to Dr. Wilson Singer.  Delora Fuel, MD [id number]

## 2013-11-04 NOTE — ED Notes (Signed)
MD at bedside. 

## 2013-11-04 NOTE — ED Notes (Signed)
Pt reports ongoing L leg pain for several days. sts it feels heavy. Hx gout. Pt also reports he feels fatigued. Pt has burning eyes as well. Pt walks with cane

## 2013-11-27 ENCOUNTER — Other Ambulatory Visit: Payer: Self-pay | Admitting: Urology

## 2013-12-04 ENCOUNTER — Encounter (HOSPITAL_COMMUNITY): Payer: Self-pay | Admitting: Pharmacy Technician

## 2013-12-08 NOTE — Patient Instructions (Signed)
[name]  12/08/2013   Your procedure is scheduled on: 12/18/2013  1145am-325pm   Report to [facility].  Follow the Signs to [place] at  [place]      am  Call this number if you have problems the morning of surgery: [phone]   Remember:   Do not eat food or drink liquids after midnight.   Take these medicines the morning of surgery with A SIP OF WATER:    Do not wear jewelry, make-up or nail polish.  Do not wear lotions, powders, or perfumes, deodorant  . Men may shave face and neck.  Do not bring valuables to the hospital.  Contacts, dentures or bridgework may not be worn into surgery.  Leave suitcase in the car. After surgery it may be brought to your room.  For patients admitted to the hospital, checkout time is 11:00 AM the day of  discharge.    Please read over the following fact sheets that you were given: [facility] - Preparing for Surgery Before surgery, you can play an important role.  Because skin is not sterile, your skin needs to be as free of germs as possible.  You can reduce the number of germs on your skin by washing with CHG (chlorahexidine gluconate) soap before surgery.  CHG is an antiseptic cleaner which kills germs and bonds with the skin to continue killing germs even after washing. Please DO NOT use if you have an allergy to CHG or antibacterial soaps.  If your skin becomes reddened/irritated stop using the CHG and inform your nurse when you arrive at Short Stay. Do not shave (including legs and underarms) for at least 48 hours prior to the first CHG shower.  You may shave your face/neck. Please follow these instructions carefully:  1.  Shower with CHG Soap the night before surgery and the  morning of Surgery.  2.  If you choose to wash your hair, wash your hair first as usual with your  normal  shampoo.  3.  After you shampoo, rinse your hair and body thoroughly to remove the  shampoo.                           4.  Use CHG as you would any  other liquid soap.  You can apply chg directly  to the skin and wash                       Gently with a scrungie or clean washcloth.  5.  Apply the CHG Soap to your body ONLY FROM THE NECK DOWN.   Do not use on face/ open                           Wound or open sores. Avoid contact with eyes, ears mouth and genitals (private parts).                       Wash face,  Genitals (private parts) with your normal soap.             6.  Wash thoroughly, paying special attention to the area where your surgery  will be performed.  7.  Thoroughly rinse your body with warm water from the neck down.  8.  DO NOT shower/wash with your normal soap after using and rinsing off  the CHG  Soap.                9.  Pat yourself dry with a clean towel.            10.  Wear clean pajamas.            11.  Place clean sheets on your bed the night of your first shower and do not  sleep with pets. Day of Surgery : Do not apply any lotions/deodorants the morning of surgery.  Please wear clean clothes to the hospital/surgery center.  FAILURE TO FOLLOW THESE INSTRUCTIONS MAY RESULT IN THE CANCELLATION OF YOUR SURGERY PATIENT SIGNATURE_________________________________  NURSE SIGNATURE__________________________________  ________________________________________________________________________  coughing and deep breathing exercises, leg exercises

## 2013-12-10 ENCOUNTER — Encounter (HOSPITAL_COMMUNITY): Payer: Self-pay

## 2013-12-10 ENCOUNTER — Encounter (HOSPITAL_COMMUNITY)
Admission: RE | Admit: 2013-12-10 | Discharge: 2013-12-10 | Disposition: A | Discharge status: Home / Self Care | Payer: 59 | Source: Ambulatory Visit | Attending: Urology | Admitting: Urology

## 2013-12-10 DIAGNOSIS — Z01812 Encounter for preprocedural laboratory examination: Secondary | ICD-10-CM | POA: Diagnosis present

## 2013-12-10 HISTORY — DX: Type 2 diabetes mellitus without complications: E11.9

## 2013-12-10 HISTORY — DX: Personal history of other diseases of the digestive system: Z87.19

## 2013-12-10 LAB — BASIC METABOLIC PANEL
BUN: 15 mg/dL (ref 6–23)
CO2: 27 mEq/L (ref 19–32)
Calcium: 8.5 mg/dL (ref 8.4–10.5)
Chloride: 107 mEq/L (ref 96–112)
Creatinine, Ser: 1.98 mg/dL — ABNORMAL HIGH (ref 0.50–1.35)
GFR, EST AFRICAN AMERICAN: 42 mL/min — AB (ref >90)
GFR, EST NON AFRICAN AMERICAN: 37 mL/min — AB (ref >90)
Glucose, Bld: 100 mg/dL — ABNORMAL HIGH (ref 70–99)
POTASSIUM: 3.2 meq/L — AB (ref 3.7–5.3)
Sodium: 147 mEq/L (ref 137–147)

## 2013-12-10 LAB — CBC
HCT: 40.5 % (ref 39.0–52.0)
Hemoglobin: 12.5 g/dL — ABNORMAL LOW (ref 13.0–17.0)
MCH: 24.9 pg — ABNORMAL LOW (ref 26.0–34.0)
MCHC: 30.9 g/dL (ref 30.0–36.0)
MCV: 80.7 fL (ref 78.0–100.0)
PLATELETS: 223 10*3/uL (ref 150–400)
RBC: 5.02 MIL/uL (ref 4.22–5.81)
RDW: 16.9 % — AB (ref 11.5–15.5)
WBC: 5.6 10*3/uL (ref 4.0–10.5)

## 2013-12-10 NOTE — Progress Notes (Signed)
EKG_ 05/31/13 EPIC  CXR 10/31/13 EPIC

## 2013-12-10 NOTE — Progress Notes (Signed)
BMP result sent via Inbasket and Fax to Dr Tresa Moore.

## 2013-12-10 NOTE — Progress Notes (Signed)
Recheck on blood pressure at time of preop appointment was 147/101.  Patient anxious over diagnosis and the fact that 3 men in his job have recently been diagnosed also.

## 2013-12-18 ENCOUNTER — Inpatient Hospital Stay (HOSPITAL_COMMUNITY): Payer: 59 | Admitting: Anesthesiology

## 2013-12-18 ENCOUNTER — Encounter (HOSPITAL_COMMUNITY): Payer: Self-pay | Admitting: *Deleted

## 2013-12-18 ENCOUNTER — Inpatient Hospital Stay (HOSPITAL_COMMUNITY)
Admission: RE | Admit: 2013-12-18 | Discharge: 2013-12-21 | DRG: 707 | Disposition: A | Discharge status: Home / Self Care | Payer: 59 | Source: Ambulatory Visit | Attending: Urology | Admitting: Urology

## 2013-12-18 ENCOUNTER — Encounter (HOSPITAL_COMMUNITY)
Admission: RE | Disposition: A | Discharge status: Home / Self Care | Payer: Self-pay | Source: Ambulatory Visit | Attending: Urology

## 2013-12-18 ENCOUNTER — Encounter (HOSPITAL_COMMUNITY): Payer: 59 | Admitting: Anesthesiology

## 2013-12-18 DIAGNOSIS — G4733 Obstructive sleep apnea (adult) (pediatric): Secondary | ICD-10-CM | POA: Diagnosis present

## 2013-12-18 DIAGNOSIS — M109 Gout, unspecified: Secondary | ICD-10-CM | POA: Diagnosis not present

## 2013-12-18 DIAGNOSIS — Z01812 Encounter for preprocedural laboratory examination: Secondary | ICD-10-CM

## 2013-12-18 DIAGNOSIS — E669 Obesity, unspecified: Secondary | ICD-10-CM | POA: Diagnosis present

## 2013-12-18 DIAGNOSIS — E785 Hyperlipidemia, unspecified: Secondary | ICD-10-CM | POA: Diagnosis present

## 2013-12-18 DIAGNOSIS — Z6841 Body Mass Index (BMI) 40.0 and over, adult: Secondary | ICD-10-CM

## 2013-12-18 DIAGNOSIS — K560 Paralytic ileus: Secondary | ICD-10-CM | POA: Diagnosis not present

## 2013-12-18 DIAGNOSIS — C61 Malignant neoplasm of prostate: Principal | ICD-10-CM | POA: Diagnosis present

## 2013-12-18 HISTORY — PX: LYMPHADENECTOMY: SHX5960

## 2013-12-18 HISTORY — PX: ROBOT ASSISTED LAPAROSCOPIC RADICAL PROSTATECTOMY: SHX5141

## 2013-12-18 LAB — GLUCOSE, CAPILLARY
GLUCOSE-CAPILLARY: 93 mg/dL (ref 70–99)
Glucose-Capillary: 147 mg/dL — ABNORMAL HIGH (ref 70–99)
Glucose-Capillary: 160 mg/dL — ABNORMAL HIGH (ref 70–99)

## 2013-12-18 LAB — TYPE AND SCREEN
ABO/RH(D): O POS
Antibody Screen: NEGATIVE

## 2013-12-18 LAB — ABO/RH: ABO/RH(D): O POS

## 2013-12-18 LAB — HEMOGLOBIN AND HEMATOCRIT, BLOOD
HCT: 39.3 % (ref 39.0–52.0)
Hemoglobin: 12.2 g/dL — ABNORMAL LOW (ref 13.0–17.0)

## 2013-12-18 SURGERY — ROBOTIC ASSISTED LAPAROSCOPIC RADICAL PROSTATECTOMY
Anesthesia: General | Site: Pelvis

## 2013-12-18 MED ORDER — PROMETHAZINE HCL 25 MG/ML IJ SOLN: 6.2500 mg | INTRAMUSCULAR | Status: DC | PRN

## 2013-12-18 MED ORDER — HYDROMORPHONE HCL PF 1 MG/ML IJ SOLN
INTRAMUSCULAR | Status: AC
  Filled 2013-12-18: qty 1

## 2013-12-18 MED ORDER — GLYCOPYRROLATE 0.2 MG/ML IJ SOLN
INTRAMUSCULAR | Status: AC
  Filled 2013-12-18: qty 4

## 2013-12-18 MED ORDER — ONDANSETRON HCL 4 MG/2ML IJ SOLN: 4.0000 mg | INTRAMUSCULAR | Status: DC | PRN

## 2013-12-18 MED ORDER — HYDROMORPHONE HCL PF 1 MG/ML IJ SOLN
0.2500 mg | INTRAMUSCULAR | Status: DC | PRN
  Administered 2013-12-18 (×3): 0.5 mg via INTRAVENOUS

## 2013-12-18 MED ORDER — LACTATED RINGERS IV SOLN
INTRAVENOUS | Status: DC
  Administered 2013-12-18: 1000 mL via INTRAVENOUS
  Administered 2013-12-18: 13:00:00 via INTRAVENOUS

## 2013-12-18 MED ORDER — SODIUM CHLORIDE 0.9 % IV BOLUS (SEPSIS)
1000.0000 mL | Freq: Once | INTRAVENOUS | Status: AC
  Administered 2013-12-18: 1000 mL via INTRAVENOUS

## 2013-12-18 MED ORDER — MIDAZOLAM HCL 2 MG/2ML IJ SOLN
INTRAMUSCULAR | Status: AC
  Filled 2013-12-18: qty 2

## 2013-12-18 MED ORDER — CLINDAMYCIN PHOSPHATE 900 MG/50ML IV SOLN
INTRAVENOUS | Status: AC
  Filled 2013-12-18: qty 50

## 2013-12-18 MED ORDER — HYDROMORPHONE HCL PF 2 MG/ML IJ SOLN
INTRAMUSCULAR | Status: AC
  Filled 2013-12-18: qty 1

## 2013-12-18 MED ORDER — CIPROFLOXACIN IN D5W 400 MG/200ML IV SOLN
INTRAVENOUS | Status: AC
  Filled 2013-12-18: qty 200

## 2013-12-18 MED ORDER — MIDAZOLAM HCL 5 MG/5ML IJ SOLN
INTRAMUSCULAR | Status: DC | PRN
  Administered 2013-12-18: 2 mg via INTRAVENOUS

## 2013-12-18 MED ORDER — LACTATED RINGERS IR SOLN
Status: DC | PRN
  Administered 2013-12-18: 1000 mL

## 2013-12-18 MED ORDER — FENTANYL CITRATE 0.05 MG/ML IJ SOLN
INTRAMUSCULAR | Status: AC
  Filled 2013-12-18: qty 5

## 2013-12-18 MED ORDER — OXYCODONE HCL 5 MG/5ML PO SOLN: 5.0000 mg | Freq: Once | ORAL | Status: DC | PRN

## 2013-12-18 MED ORDER — ROCURONIUM BROMIDE 100 MG/10ML IV SOLN
INTRAVENOUS | Status: AC
  Filled 2013-12-18: qty 1

## 2013-12-18 MED ORDER — OXYCODONE HCL 5 MG PO TABS
5.0000 mg | ORAL_TABLET | ORAL | Status: DC | PRN
  Administered 2013-12-19 – 2013-12-20 (×6): 5 mg via ORAL
  Filled 2013-12-18 (×6): qty 1

## 2013-12-18 MED ORDER — BUPIVACAINE LIPOSOME 1.3 % IJ SUSP
20.0000 mL | Freq: Once | INTRAMUSCULAR | Status: DC
  Filled 2013-12-18: qty 20

## 2013-12-18 MED ORDER — ATORVASTATIN CALCIUM 20 MG PO TABS
20.0000 mg | ORAL_TABLET | Freq: Every day | ORAL | Status: DC
  Administered 2013-12-18 – 2013-12-20 (×3): 20 mg via ORAL
  Filled 2013-12-18 (×4): qty 1

## 2013-12-18 MED ORDER — GLYCOPYRROLATE 0.2 MG/ML IJ SOLN
INTRAMUSCULAR | Status: DC | PRN
  Administered 2013-12-18: .8 mg via INTRAVENOUS

## 2013-12-18 MED ORDER — ROCURONIUM BROMIDE 100 MG/10ML IV SOLN
INTRAVENOUS | Status: DC | PRN
  Administered 2013-12-18: 20 mg via INTRAVENOUS
  Administered 2013-12-18: 50 mg via INTRAVENOUS

## 2013-12-18 MED ORDER — PROPOFOL 10 MG/ML IV BOLUS
INTRAVENOUS | Status: DC | PRN
  Administered 2013-12-18: 200 mg via INTRAVENOUS

## 2013-12-18 MED ORDER — KCL IN DEXTROSE-NACL 20-5-0.45 MEQ/L-%-% IV SOLN
INTRAVENOUS | Status: AC
  Filled 2013-12-18: qty 1000

## 2013-12-18 MED ORDER — HYDROMORPHONE HCL PF 1 MG/ML IJ SOLN
INTRAMUSCULAR | Status: DC | PRN
  Administered 2013-12-18: 1 mg via INTRAVENOUS

## 2013-12-18 MED ORDER — ACETAMINOPHEN 500 MG PO TABS
1000.0000 mg | ORAL_TABLET | Freq: Three times a day (TID) | ORAL | Status: AC
  Administered 2013-12-18 – 2013-12-19 (×3): 1000 mg via ORAL
  Filled 2013-12-18 (×3): qty 2

## 2013-12-18 MED ORDER — HYDROMORPHONE HCL PF 1 MG/ML IJ SOLN
0.5000 mg | INTRAMUSCULAR | Status: DC | PRN
  Administered 2013-12-18 – 2013-12-19 (×5): 1 mg via INTRAVENOUS
  Filled 2013-12-18 (×5): qty 1

## 2013-12-18 MED ORDER — DOCUSATE SODIUM 100 MG PO CAPS
100.0000 mg | ORAL_CAPSULE | Freq: Two times a day (BID) | ORAL | Status: DC
  Administered 2013-12-18 – 2013-12-21 (×6): 100 mg via ORAL
  Filled 2013-12-18 (×7): qty 1

## 2013-12-18 MED ORDER — LIDOCAINE HCL (CARDIAC) 20 MG/ML IV SOLN
INTRAVENOUS | Status: DC | PRN
  Administered 2013-12-18: 50 mg via INTRAVENOUS

## 2013-12-18 MED ORDER — CIPROFLOXACIN IN D5W 400 MG/200ML IV SOLN
400.0000 mg | INTRAVENOUS | Status: AC
  Administered 2013-12-18: 400 mg via INTRAVENOUS

## 2013-12-18 MED ORDER — PROPOFOL 10 MG/ML IV BOLUS
INTRAVENOUS | Status: AC
  Filled 2013-12-18: qty 20

## 2013-12-18 MED ORDER — NEOSTIGMINE METHYLSULFATE 10 MG/10ML IV SOLN
INTRAVENOUS | Status: DC | PRN
  Administered 2013-12-18: 5 mg via INTRAVENOUS

## 2013-12-18 MED ORDER — MEPERIDINE HCL 50 MG/ML IJ SOLN: 6.2500 mg | INTRAMUSCULAR | Status: DC | PRN

## 2013-12-18 MED ORDER — BUPIVACAINE LIPOSOME 1.3 % IJ SUSP
INTRAMUSCULAR | Status: DC | PRN
  Administered 2013-12-18: 20 mL

## 2013-12-18 MED ORDER — SODIUM CHLORIDE 0.9 % IJ SOLN
INTRAMUSCULAR | Status: AC
  Filled 2013-12-18: qty 20

## 2013-12-18 MED ORDER — CLINDAMYCIN PHOSPHATE 900 MG/50ML IV SOLN
900.0000 mg | INTRAVENOUS | Status: AC
  Administered 2013-12-18: 900 mg via INTRAVENOUS

## 2013-12-18 MED ORDER — AMLODIPINE BESYLATE 10 MG PO TABS
10.0000 mg | ORAL_TABLET | Freq: Every morning | ORAL | Status: DC
  Administered 2013-12-19 – 2013-12-21 (×3): 10 mg via ORAL
  Filled 2013-12-18 (×3): qty 1

## 2013-12-18 MED ORDER — NEOSTIGMINE METHYLSULFATE 10 MG/10ML IV SOLN
INTRAVENOUS | Status: AC
  Filled 2013-12-18: qty 1

## 2013-12-18 MED ORDER — KCL IN DEXTROSE-NACL 20-5-0.45 MEQ/L-%-% IV SOLN
INTRAVENOUS | Status: DC
  Administered 2013-12-18: 17:00:00 via INTRAVENOUS
  Filled 2013-12-18 (×6): qty 1000

## 2013-12-18 MED ORDER — OXYCODONE HCL 5 MG PO TABS: 5.0000 mg | ORAL_TABLET | Freq: Once | ORAL | Status: DC | PRN

## 2013-12-18 MED ORDER — FENTANYL CITRATE 0.05 MG/ML IJ SOLN
INTRAMUSCULAR | Status: DC | PRN
  Administered 2013-12-18 (×4): 50 ug via INTRAVENOUS
  Administered 2013-12-18: 100 ug via INTRAVENOUS
  Administered 2013-12-18 (×4): 50 ug via INTRAVENOUS

## 2013-12-18 MED ORDER — SENNA 8.6 MG PO TABS
1.0000 | ORAL_TABLET | Freq: Two times a day (BID) | ORAL | Status: DC
  Administered 2013-12-18 – 2013-12-21 (×6): 8.6 mg via ORAL
  Filled 2013-12-18 (×6): qty 1

## 2013-12-18 MED ORDER — ONDANSETRON HCL 4 MG/2ML IJ SOLN
INTRAMUSCULAR | Status: AC
  Filled 2013-12-18: qty 2

## 2013-12-18 MED ORDER — LIDOCAINE HCL (CARDIAC) 20 MG/ML IV SOLN
INTRAVENOUS | Status: AC
  Filled 2013-12-18: qty 5

## 2013-12-18 SURGICAL SUPPLY — 63 items
BANDAGE ESMARK 6X9 LF (GAUZE/BANDAGES/DRESSINGS) IMPLANT
BNDG ESMARK 6X9 LF (GAUZE/BANDAGES/DRESSINGS)
CABLE HIGH FREQUENCY MONO STRZ (ELECTRODE) ×3 IMPLANT
CANISTER SUCTION 2500CC (MISCELLANEOUS) IMPLANT
CATH FOLEY 2WAY SLVR 18FR 30CC (CATHETERS) ×3 IMPLANT
CATH ROBINSON RED A/P 16FR (CATHETERS) ×3 IMPLANT
CATH TIEMANN FOLEY 18FR 5CC (CATHETERS) ×3 IMPLANT
CHLORAPREP W/TINT 26ML (MISCELLANEOUS) ×3 IMPLANT
CLIP LIGATING HEM O LOK PURPLE (MISCELLANEOUS) ×6 IMPLANT
CLIP LIGATING HEMO LOK XL GOLD (MISCELLANEOUS) IMPLANT
CLOTH BEACON ORANGE TIMEOUT ST (SAFETY) ×3 IMPLANT
COVER MAYO STAND STRL (DRAPES) IMPLANT
COVER SURGICAL LIGHT HANDLE (MISCELLANEOUS) IMPLANT
COVER TIP SHEARS 8 DVNC (MISCELLANEOUS) ×2 IMPLANT
COVER TIP SHEARS 8MM DA VINCI (MISCELLANEOUS) ×1
CUTTER ECHEON FLEX ENDO 45 340 (ENDOMECHANICALS) ×3 IMPLANT
DECANTER SPIKE VIAL GLASS SM (MISCELLANEOUS) ×3 IMPLANT
DERMABOND ADVANCED (GAUZE/BANDAGES/DRESSINGS) ×1
DERMABOND ADVANCED .7 DNX12 (GAUZE/BANDAGES/DRESSINGS) ×2 IMPLANT
DRAPE ARM DVNC X/XI (DISPOSABLE) ×8 IMPLANT
DRAPE COLUMN DVNC XI (DISPOSABLE) ×2 IMPLANT
DRAPE DA VINCI XI ARM (DISPOSABLE) ×4
DRAPE DA VINCI XI COLUMN (DISPOSABLE) ×1
DRAPE LAPAROSCOPIC ABDOMINAL (DRAPES) ×3 IMPLANT
DRSG TEGADERM 4X4.75 (GAUZE/BANDAGES/DRESSINGS) ×9 IMPLANT
DRSG TEGADERM 6X8 (GAUZE/BANDAGES/DRESSINGS) ×6 IMPLANT
ELECT REM PT RETURN 9FT ADLT (ELECTROSURGICAL) ×3
ELECTRODE REM PT RTRN 9FT ADLT (ELECTROSURGICAL) ×2 IMPLANT
GAUZE SPONGE 2X2 8PLY STRL LF (GAUZE/BANDAGES/DRESSINGS) ×2 IMPLANT
GLOVE BIO SURGEON STRL SZ 6.5 (GLOVE) ×3 IMPLANT
GLOVE BIOGEL M STRL SZ7.5 (GLOVE) ×6 IMPLANT
GLOVE BIOGEL PI IND STRL 7.5 (GLOVE) IMPLANT
GLOVE BIOGEL PI INDICATOR 7.5 (GLOVE)
GOWN STRL REUS W/TWL LRG LVL4 (GOWN DISPOSABLE) ×9 IMPLANT
HOLDER FOLEY CATH W/STRAP (MISCELLANEOUS) ×3 IMPLANT
IV LACTATED RINGERS 1000ML (IV SOLUTION) ×3 IMPLANT
KIT ACCESSORY DA VINCI DISP (KITS)
KIT ACCESSORY DVNC DISP (KITS) IMPLANT
KIT PROCEDURE DA VINCI SI (MISCELLANEOUS) ×1
KIT PROCEDURE DVNC SI (MISCELLANEOUS) ×2 IMPLANT
NEEDLE INSUFFLATION 14GA 120MM (NEEDLE) ×3 IMPLANT
NEEDLE SPNL 22GX7 SPINOC (NEEDLE) IMPLANT
PACK ROBOT UROLOGY CUSTOM (CUSTOM PROCEDURE TRAY) ×3 IMPLANT
RELOAD GREEN ECHELON 45 (STAPLE) ×3 IMPLANT
SEAL CANN UNIV 5-8 DVNC XI (MISCELLANEOUS) ×8 IMPLANT
SEAL XI 5MM-8MM UNIVERSAL (MISCELLANEOUS) ×4
SET TUBE IRRIG SUCTION NO TIP (IRRIGATION / IRRIGATOR) ×3 IMPLANT
SOLUTION ELECTROLUBE (MISCELLANEOUS) ×3 IMPLANT
SPONGE GAUZE 2X2 STER 10/PKG (GAUZE/BANDAGES/DRESSINGS) ×1
SPONGE LAP 4X18 X RAY DECT (DISPOSABLE) ×3 IMPLANT
SUT ETHILON 3 0 PS 1 (SUTURE) ×3 IMPLANT
SUT MNCRL AB 4-0 PS2 18 (SUTURE) ×6 IMPLANT
SUT PDS AB 1 CT1 27 (SUTURE) ×6 IMPLANT
SUT VIC AB 2-0 SH 27 (SUTURE) ×2
SUT VIC AB 2-0 SH 27X BRD (SUTURE) ×4 IMPLANT
SUT VLOC BARB 180 ABS3/0GR12 (SUTURE) ×9
SUTURE VLOC BRB 180 ABS3/0GR12 (SUTURE) ×6 IMPLANT
SYR 27GX1/2 1ML LL SAFETY (SYRINGE) ×3 IMPLANT
SYR BULB IRRIGATION 50ML (SYRINGE) ×3 IMPLANT
TOWEL OR 17X26 10 PK STRL BLUE (TOWEL DISPOSABLE) ×3 IMPLANT
TOWEL OR NON WOVEN STRL DISP B (DISPOSABLE) ×3 IMPLANT
TROCAR 12M 150ML BLUNT (TROCAR) ×3 IMPLANT
WATER STERILE IRR 1500ML POUR (IV SOLUTION) IMPLANT

## 2013-12-18 NOTE — Anesthesia Preprocedure Evaluation (Signed)
Anesthesia Evaluation  Patient identified by MRN, date of birth, ID band Patient awake    Reviewed: Allergy & Precautions, H&P , NPO status , Patient's Chart, lab work & pertinent test results  Airway Mallampati: III TM Distance: >3 FB Neck ROM: Full    Dental no notable dental hx. (+) Dental Advisory Given   Pulmonary sleep apnea ,  breath sounds clear to auscultation  Pulmonary exam normal       Cardiovascular hypertension, Pt. on medications Rhythm:Regular Rate:Normal     Neuro/Psych negative neurological ROS  negative psych ROS   GI/Hepatic Neg liver ROS, hiatal hernia,   Endo/Other  diabetes, Type 2, Oral Hypoglycemic Agents  Renal/GU CRF and Renal InsufficiencyRenal diseaseStage 3 chronic kidney disease     Musculoskeletal negative musculoskeletal ROS (+)   Abdominal   Peds  Hematology negative hematology ROS (+)   Anesthesia Other Findings   Reproductive/Obstetrics                           Anesthesia Physical  Anesthesia Plan  ASA: III  Anesthesia Plan: General   Post-op Pain Management:    Induction: Intravenous  Airway Management Planned: Oral ETT  Additional Equipment:   Intra-op Plan:   Post-operative Plan: Extubation in OR  Informed Consent: I have reviewed the patients History and Physical, chart, labs and discussed the procedure including the risks, benefits and alternatives for the proposed anesthesia with the patient or authorized representative who has indicated his/her understanding and acceptance.   Dental advisory given  Plan Discussed with: CRNA  Anesthesia Plan Comments:         Anesthesia Quick Evaluation

## 2013-12-18 NOTE — Transfer of Care (Signed)
Immediate Anesthesia Transfer of Care Note  Patient: [name]  Procedure(s) Performed: Procedure(s) (LRB): ROBOTIC ASSISTED LAPAROSCOPIC RADICAL PROSTATECTOMY AND INDOCYANINE GREEN DYE (N/A) PELVIC LYMPH NODE DISSECTION (Bilateral)  Patient Location: PACU  Anesthesia Type: General  Level of Consciousness: sedated, patient cooperative and responds to stimulation  Airway & Oxygen Therapy: Patient Spontanous Breathing and Patient connected to face mask oxgen  Post-op Assessment: Report given to PACU RN and Post -op Vital signs reviewed and stable  Post vital signs: Reviewed and stable  Complications: No apparent anesthesia complications

## 2013-12-18 NOTE — Anesthesia Postprocedure Evaluation (Signed)
Anesthesia Post Note  Patient: [name]  Procedure(s) Performed: Procedure(s) (LRB): ROBOTIC ASSISTED LAPAROSCOPIC RADICAL PROSTATECTOMY AND INDOCYANINE GREEN DYE (N/A) PELVIC LYMPH NODE DISSECTION (Bilateral)  Anesthesia type: General  Patient location: PACU  Post pain: Pain level controlled  Post assessment: Post-op Vital signs reviewed  Last Vitals: BP 152/83  Pulse 89  Temp(Src) 36.4 C (Oral)  Resp 16  Ht 5\' 11"  (1.803 m)  Wt 294 lb (133.358 kg)  BMI 41.02 kg/m2  SpO2 95%  Post vital signs: Reviewed  Level of consciousness: sedated  Complications: No apparent anesthesia complications

## 2013-12-18 NOTE — Discharge Instructions (Signed)

## 2013-12-18 NOTE — H&P (Signed)
[name] is an 53 y.o. male.    Chief Complaint: Pre-Op Robotic Prostatectomy  HPI:   1 - Moderate Risk Prostate Cancer - Gleason 3+4=7 RMA by biopsy 2015 on eval PSA 2.7 (corrected for finasteride). TRUS 37m with moderate median lobe. Moderate  baseline voiding complaints well controlled on finasteride. He does have significant truncal obesity. He met wit pelvic PT pre-operatively.  PMH sig for HTN. No CV disease. No strong blood thinners. No prior abd surgeries.   Today [name] seen to proceed with robotic prostatectomy for his moderate risk prostate cancer. No interval fevers.   Past Medical History  Diagnosis Date  . Hyperlipidemia   . Hypertension   . Gout     STABLE  PER PT 10-22-2013  . BPH (benign prostatic hypertrophy)   . Elevated PSA   . Microhematuria   . Borderline diabetes mellitus   . Nocturia   . Arthritis     SPINE  . Wears glasses   . CKD (chronic kidney disease), stage III   . OSA (obstructive sleep apnea)     PER PT STUDY DONE 2005 (APPROX).  NON- COMPLIANT CPAP  . H/O hiatal hernia   . Diabetes mellitus without complication     borderline on no meds     Past Surgical History  Procedure Laterality Date  . Transthoracic echocardiogram  07-03-2006    MILD - MODERATE LVH/  EF 55-60%/  GRADE I DIASTOIC DYSFUNCTION/  TRIVIAL  TR /  TRIVIAL PERICARDIAL EFFUSION  . Right hand surgery  YRS AGO  . [facility]   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%/  2ndDIAGONAL 30%/  RCA 40%/  EF 60%  . [facility]   [facility]ITICAL [facility]/  PRESERVED LV  . Prostate biopsy N/A 10/27/2013    Procedure: BIOPSY TRANSRECTAL ULTRASONIC PROSTATE (TUBP);  Surgeon: Jonathan Hazard MD;  [facility]  Service: Urology;  Laterality: N/A;  . Cystoscopy w/ retrogrades Bilateral 10/27/2013    Procedure: CYSTOSCOPY WITH RETROGRADE PYELOGRAM;  Surgeon: Jonathan Hazard MD;  [facility]  Service: Urology;  Laterality: Bilateral;    Family History  Problem Relation Age of Onset  . Cirrhosis Father   . Other Mother     shot and killed   Social History:  reports that he has never smoked. He has never used smokeless tobacco. He reports that he does not drink alcohol or use illicit drugs.  Allergies:  Allergies  Allergen Reactions  . Penicillins Nausea And Vomiting and Rash    No prescriptions prior to admission    No results found for this or any previous visit (from the past 48 hour(s)). No results found.  Review of Systems  Constitutional: Negative.  Negative for fever and chills.  HENT: Negative.   Eyes: Negative.   Respiratory: Negative.   Cardiovascular: Negative.   Gastrointestinal: Negative.   Genitourinary: Negative.   Musculoskeletal: Negative.   Skin: Negative.   Neurological: Negative.   Endo/Heme/Allergies: Negative.   Psychiatric/Behavioral: Negative.     There were no vitals taken for this visit. Physical Exam  Constitutional: He is oriented to person, place, and time. He appears well-developed and well-nourished.  HENT:  Head: Normocephalic and atraumatic.  Eyes: EOM are normal. Pupils are equal, round, and reactive to light.  Neck: Normal range of motion. Neck supple.  Cardiovascular: Normal rate.   Respiratory: Effort normal.  GI:  Soft. Bowel sounds are normal.  Genitourinary: Penis normal.  Musculoskeletal: Normal range of motion.  Neurological: He is alert and oriented to person, place, and time.  Skin: Skin is warm and dry.  Psychiatric: He has a normal mood and affect. His behavior is normal. Judgment and thought content normal.     Assessment/Plan   1 - Moderate Risk Prostate Cancer - We rediscussed prostatectomy and specifically robotic prostatectomy with bilateral pelvic lymphadenectom. I showed the patient on their abdomen the approximately 6 small incision (trocar) sites as well as presumed  extraction sites with robotic approach as well as possible open incision sites should open conversion be necessary. We rediscussed peri-operative risks including bleeding, infection, deep vein thrombosis, pulmonary embolism, compartment syndrome, nuropathy / neuropraxia, heart attack, stroke, death, as well as long-term risks such as non-cure / need for additional therapy. We specifically readdressed that the procedure would compromise urinary control leading to stress incontinence which typically resolves with time and pelvic rehabilitation (Kegel's, etc..), but can sometimes be permanent and require additional therapy including surgery. We also specifically readdressed sexual sequellae including significant erectile dysfunction which typically partially resolves with time but can also be permanent and require additional therapy including surgery.    We rediscussed the typical hospital course including usual 1-2 night hospitalization, discharge with foley catheter in place usually for 1-2 weeks before voiding trial as well as usually 2 week recovery until able to perform most non-strenuous activity and 6 weeks until able to return to most jobs and more strenuous activity such as exercise.   Also rementioned truncal obesity increases risk of pulm problems peri-op due to need for increased insuflation pressures.  [name] 12/18/2013, 6:20 AM

## 2013-12-18 NOTE — Brief Op Note (Signed)
12/18/2013  3:29 PM  PATIENT:  [name]  53 y.o. male  PRE-OPERATIVE DIAGNOSIS:  MODERATE RISK PROSTATE CANCER  POST-OPERATIVE DIAGNOSIS:  MODERATE RISK PROSTATE CANCER  PROCEDURE:  Procedure(s): ROBOTIC ASSISTED LAPAROSCOPIC RADICAL PROSTATECTOMY AND INDOCYANINE GREEN DYE (N/A) PELVIC LYMPH NODE DISSECTION (Bilateral)  SURGEON:  Surgeon(s) and Role:    * Sharyn Creamer, MD - Assisting    * Alexis Frock, MD - Primary  PHYSICIAN ASSISTANT:   ASSISTANTS: Rolan Bucco MD   ANESTHESIA:   general  EBL:  Total I/O In: 2000 [I.V.:2000] Out: 150 [Blood:150]  BLOOD ADMINISTERED:none  DRAINS: JP to bulb suction, foley to straight drain   LOCAL MEDICATIONS USED:  MARCAINE     SPECIMEN:  Source of Specimen:  pelvic lymph nodes, prostatectomy, revised bladder neck, periprostatic fat  DISPOSITION OF SPECIMEN:  PATHOLOGY  COUNTS:  YES  TOURNIQUET:  * No tourniquets in log *  DICTATION: .Other Dictation: Dictation Number [id number]  PLAN OF CARE: Admit to inpatient   PATIENT DISPOSITION:  PACU - hemodynamically stable.   Delay start of Pharmacological VTE agent (>24hrs) due to surgical blood loss or risk of bleeding: yes

## 2013-12-19 LAB — BASIC METABOLIC PANEL
ANION GAP: 12 (ref 5–15)
BUN: 15 mg/dL (ref 6–23)
CO2: 27 mEq/L (ref 19–32)
Calcium: 8.3 mg/dL — ABNORMAL LOW (ref 8.4–10.5)
Chloride: 103 mEq/L (ref 96–112)
Creatinine, Ser: 2.19 mg/dL — ABNORMAL HIGH (ref 0.50–1.35)
GFR calc non Af Amer: 32 mL/min — ABNORMAL LOW (ref >90)
GFR, EST AFRICAN AMERICAN: 37 mL/min — AB (ref >90)
Glucose, Bld: 127 mg/dL — ABNORMAL HIGH (ref 70–99)
POTASSIUM: 3.3 meq/L — AB (ref 3.7–5.3)
SODIUM: 142 meq/L (ref 137–147)

## 2013-12-19 LAB — HEMOGLOBIN AND HEMATOCRIT, BLOOD
HCT: 39.6 % (ref 39.0–52.0)
HEMOGLOBIN: 12.3 g/dL — AB (ref 13.0–17.0)

## 2013-12-19 MED ORDER — ZOLPIDEM TARTRATE 5 MG PO TABS
5.0000 mg | ORAL_TABLET | Freq: Every evening | ORAL | Status: DC | PRN
  Administered 2013-12-20 (×2): 5 mg via ORAL
  Filled 2013-12-19 (×2): qty 1

## 2013-12-19 NOTE — Op Note (Signed)
[name], [name] NO.:  [id number]  MEDICAL RECORD NO.:  [id number]  LOCATION:  [id number]                         FACILITY:  [facility]  PHYSICIAN:  Alexis Frock, MD     DATE OF BIRTH:  [birth year]  DATE OF PROCEDURE: 12/18/2013  DATE OF DISCHARGE:                              OPERATIVE REPORT   DIAGNOSIS:  Moderate risk prostate cancer.  PROCEDURE:  Robotic assisted laparoscopic radical prostatectomy with bilateral pelvic lymphadenectomy and injection of ICG dye for sentinel lymphangiography.  ASSISTANT:  Rolan Bucco, MD  ESTIMATED BLOOD LOSS:  100 mL.  COMPLICATIONS:  None.  SPECIMEN: 1. Periprostatic fat. 2. Right external iliac lymph node, sentinel. 3. Right axillary lymph nodes. 4. Left external iliac lymph nodes. 5. Left obturator lymph nodes. 6. Radical prostatectomy. 7. Revised left bladder neck margin.  DRAINS: 1. Jackson-Pratt drain to bulb suction. 2. Foley catheter to straight drain.  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  Options were discussed including curative-intent local therapy with surgical extirpation versus ablated therapies versus surveillance protocol; and he adamantly wished to proceed with curative-intent therapy with robotic prostatectomy. Informed consent was signed and placed in medical record.  Notably given his moderate risk disease, lymphadenectomy indicated.  PROCEDURE IN DETAIL:  The patient being [name] and procedure being robotic prostatectomy was confirmed.  Procedure was carried out.  Time- out was performed.  Intravenous antibiotics administered.  General endotracheal anesthesia was reduced.  The patient was placed into a supine position.  After tucking his arms placed in sequential compression devices, placing 3-inch tape across his chest for  further stabilization.  Sterile field was then created by first clipper shaving and prepping and draping, then for xiphoid, abdomen using chlorhexidine gluconate, and his penis, perineum, proximal thighs using iodine x3. Foley catheter was placed for easier straight drain.  He was placed into approximately 20-degree steep Trendelenburg.  This provided with a suitable position without pulmonary issues.  A high-flow low pressure pneumoperitoneum was obtained using Veress technique in the supraumbilical midline.  Having passed the aspiration and drop test, an 8-mm robotic camera port was placed in the same location.  Laparoscopic examination of the peritoneal cavity revealed some adhesions in the left lower quadrant that were really felt to be noncomplex that did occur over the area of the left internal ring. Additional ports in place as follows; right paramedian 8-mm robotic port, left paramedian 8-mm robotic port, left far lateral 8-mm robotic port, right far lateral 12-mm assist port, right paramedian 5 mm suction port.  Robot was docked and passed through electronic checks.  Initial attention was directed at the development of the space of Retzius. Incision made lateral to the left medial umbilical ligament from the midline towards the area of the internal ring coursing on the area of the iliac vessels.  This inherently released some of the adherent adhesive tissue away from the anterior abdominal wall, therefore did not require much specific adhesiolysis.  The left vas devferens encountered, ligated, and placed on gentle medial traction.  Dissection then proceeded on the left wall of the bladder towards the area of the endopelvic fascia on the left side.  A mirror image of sequence was performed on the right side, freeing the right bladder away from the pelvic side wall.  Anterior attachements were then taken down, this completely releasing the bladder thus exposing the anterior base of  the prostate.  Periprostatic fat was carefully dissected and set aside for permanent pathology, which exposed the bladder prostate junction more clearly.  Next, a 0.2 mL of indocyanine green dye were placed using robotically guided percutaneously applied spinal needle into each level of the prostate with intervening section to avoid spillage, which did not occur.  Endopelvic fascia was carefully swept away from the lateral aspect of the prostate in the mid apex orientation.  This exposed the area of the dorsal venous complex, which was controlled using endovascular load stapler 45 mm, taking great care to avoid damage to membranous urethra and this was not occurred and then approximately 15 minutes post dye injection, sentinel lymphangiography was performed. Inspection of the pelvis under near-infrafred fluorescnece revealed a single area of hyperfluorescence corresponding with sentinel nodes in the right external iliac lymph node packet.  No other hyperfluorescence nodes were seen.  There were multiple lymphatic channel seen coursing across the base of the prostate and over the service of the urinary bladder.  Next, all fiber fatty tissue and confines of the external iliac artery and vein, pelvic side wall were carefully mobilized.  Lymphostasis was achieved with cold clips.  This set aside, labeled right external iliac lymph node as sentinel.  Next, all fiber fatty tissue in the confines of the right obturator nerve, pelvic side wall, and external iliac vein were carefully mobilized.  Hemostasis was achieved with cold clips, set aside labeled right obturator lymph nodes.  Similarly, left external iliac and left obturator lymph nodes were dissected with lymphostasis achieved with cold clips and set aside several specimens for permanent pathology.  The obturator nerves bilaterally were inspected following these maneuvers and found to be intact.  Attention was then directed at bladder neck  dissection.  The area of the bladder neck was identified moving the Foley catheter back and forth.  Bladder neck dissection was performed in this plane in an anterior-posterior direction keeping what appeared to be circular fibers of the bladder neck with the area of the prostate.  As such, the patient had a quite impressive median lobe as previously documented by transrectal ultrasound.  A figure-of-eight Vicryl was placed into this and placed in superior traction thus allowing visualization behind this and posterior bladder neck dissection.  Ureteral orifices were visualized during and post these maneuvers and were uninjured.  The bladder neck was not of excessive caliber.  Posterior dissection was performed by incising approximately 7 mm inferoposteriorly to the posterior bladder neck dissecting directly inferior posteriorly. Bilateral vas deferens were dissected for distance of 4 cm ligated and placed in gentle superior traction.  Dissection proceeded to the tip of the seminal vesicles bilaterally and further towards the area of the apex of the prostate which exposed the neurovascular pedicles bilaterally. Careful control was taken using cold clips at the vascular pedicles in a base to apex orientation, first in the left and then on the right side. There was no gross extraprostatic disease.  Therefore, aggressive nerve sparing was performed bilaterally sweeping the presumed neurovascular bundles away from the area of the anterolateral aspect of the prostate towards the apex.  Apical  dissection was performed in the anterior plane in the prostate on gentle superior traction, identifying membranous urethra which was transected keeping what appeared to be a copious amount with the prostate specimen given the area of apical positivity on the right side.  This completely freed up the radical prostatectomy specimens and placed in the EndoCatch bag for later retrieval.  The pelvis was  irrigated and transanally applied catheter was inflated with air.  The bulb syringe endorectal violation was seen.  Next, posterior reconstruction was performed using a single V-Loc suture bring the posterior urethral plate into tension free apposition to the posterior bladder neck.  Mucosa to mucosa anastomosis was performed using double- armed V-Loc suture from the 6 o'clock to the 12 o'clock position bilaterally.  New Foley catheter was placed, irrigated quantitatively. No evidence of leak was seen.  Anastomotic stitch was further anchored to the puboprostatic ligaments, thus performing anterior reconstruction as well.  At this point, all sponge and needle counts were correct. A closed suction drain was brought through the previous left lateral  robotic port site.  Robot was undocked.  The right 12-mm assistant port was closed to the level of the fascia using Eulas Post- Thomason suture passer and 0 Vicryl.  Specimen was retrieved by extending the previous camera port site for total distance approximately 4 cm and removing the prostatectomy specimen and setting aside for permanent pathology.  This site was closed at the level of the fascia using figure-of- eight PDS x3, followed by reapproximation of Scarpa's using Vicryl.  All skin incisions were reapproximated using subcuticular Monocryl followed by Dermabond.  Procedure was terminated.  The patient tolerated the procedure well.  There were no immediate periprocedural complications. The patient was taken to postanesthesia care unit in stable condition.          ______________________________ Alexis Frock, MD     TM/MEDQ  D:  12/18/2013  T:  12/18/2013  Job:  [id number]

## 2013-12-19 NOTE — Progress Notes (Addendum)
1 Day Post-Op Subjective: The patient is doing well.  No nausea or vomiting. Pain is intermittently controlled, predominant pain is in the right lower quadrant at the site of the most lateral port.  The patient has been up and walking several times. He has required IV pain medication in addition to his oral pain meds.  The patient has had some sips of clear liquids, has not had tremendous amount of by mouth intake. Objective: Vital signs in last 24 hours: Temp:  [97.3 F (36.3 C)-98.5 F (36.9 C)] 98.5 F (36.9 C) (07/03 0539) Pulse Rate:  [85-106] 100 (07/03 0539) Resp:  [16-18] 18 (07/03 0539) BP: (132-155)/(82-110) 147/90 mmHg (07/03 0539) SpO2:  [95 %-100 %] 98 % (07/03 0539)  Intake/Output from previous day: 07/02 0701 - 07/03 0700 In: 3450 [I.V.:2450; IV Piggyback:1000] Out: 1835 [Urine:1525; Drains:160; Blood:150] Intake/Output this shift: Total I/O In: 480 [P.O.:480] Out: 590 [Urine:550; Drains:40]  Physical Exam:  General: Alert and oriented. GI: Soft, Nondistended. Incisions: Dressings intact. There may be a hematoma on the patient's right lateral port site as there is some edema and is tender to palpation. No severe discoloration was appreciated. Urine: Clear Extremities: Nontender, no erythema, no edema.  Lab Results:  Recent Labs  12/18/13 1549 12/19/13 0450  HGB 12.2* 12.3*  HCT 39.3 39.6    Recent Labs  12/19/13 0450  NA 142  K 3.3*  CL 103  CO2 27  GLUCOSE 127*  BUN 15  CREATININE 2.19*  CALCIUM 8.3*      Assessment/Plan: POD# 1 s/p robotic prostatectomy.  1) SL IVF 2) Ambulate, Incentive spirometry 3) Transition to oral pain medication 4) D/C pelvic drain 5) Plan for likely discharge tomorrow.   LOS: 1 day   Ardis Hughs 12/19/2013, 12:05 PM

## 2013-12-20 LAB — COMPREHENSIVE METABOLIC PANEL
ALT: 12 U/L (ref 0–53)
ANION GAP: 13 (ref 5–15)
AST: 16 U/L (ref 0–37)
Albumin: 3.1 g/dL — ABNORMAL LOW (ref 3.5–5.2)
Alkaline Phosphatase: 73 U/L (ref 39–117)
BUN: 14 mg/dL (ref 6–23)
CALCIUM: 9.1 mg/dL (ref 8.4–10.5)
CHLORIDE: 98 meq/L (ref 96–112)
CO2: 28 mEq/L (ref 19–32)
CREATININE: 2.2 mg/dL — AB (ref 0.50–1.35)
GFR calc Af Amer: 37 mL/min — ABNORMAL LOW (ref >90)
GFR, EST NON AFRICAN AMERICAN: 32 mL/min — AB (ref >90)
Glucose, Bld: 127 mg/dL — ABNORMAL HIGH (ref 70–99)
Potassium: 3.4 mEq/L — ABNORMAL LOW (ref 3.7–5.3)
Sodium: 139 mEq/L (ref 137–147)
Total Bilirubin: 0.5 mg/dL (ref 0.3–1.2)
Total Protein: 6.8 g/dL (ref 6.0–8.3)

## 2013-12-20 LAB — HEMOGLOBIN AND HEMATOCRIT, BLOOD
HCT: 42 % (ref 39.0–52.0)
Hemoglobin: 13.2 g/dL (ref 13.0–17.0)

## 2013-12-20 MED ORDER — OXYCODONE-ACETAMINOPHEN 5-325 MG PO TABS
1.0000 | ORAL_TABLET | ORAL | Status: DC | PRN
  Administered 2013-12-20 – 2013-12-21 (×4): 2 via ORAL
  Filled 2013-12-20 (×4): qty 2

## 2013-12-20 NOTE — Progress Notes (Signed)
Patient ID: [name], male   DOB: 1960-12-11, 53 y.o.   MRN: [id number]  Pt c/o pain in RUQ, burning, stinging. Pain is limiting his ambulation. Also some crampy abd pain. No flatus or BM. No N/V, f/c. No CP or SOB. Tolerating a reg diet.  Filed Vitals:   12/20/13 0520  BP: 151/103  Pulse: 104  Temp: 99.6 F (37.6 C)  Resp: 18    Intake/Output Summary (Last 24 hours) at 12/20/13 1158 Last data filed at 12/20/13 [id number]  Gross per 24 hour  Intake  997.5 ml  Output   1810 ml  Net -812.5 ml    PE: NAD Sitting in chair CV - RRR Lungs - CTA B Abd - normal BS, soft, NT, pain is between two port sites in RUQ, incisions appear C/D/I, ?some distention GU - urine clear Ext - no calf pain or swelling  Imp - POD#2 RALP - suspect port site pain with possible ileus complicating - ? Cholecystitis - Plan - continue observation -h/h, CMP sent - check lytes, LFT's encouraged ambulation, IS  -on colace and senna - await bowel function

## 2013-12-20 NOTE — Progress Notes (Signed)
Patient ID: [name], male   DOB: 12/22/1960, 53 y.o.   MRN: [id number]  Pt labs look stable, will change to Percocet. Check cbc, bmet in AM.

## 2013-12-21 LAB — BASIC METABOLIC PANEL
ANION GAP: 13 (ref 5–15)
BUN: 18 mg/dL (ref 6–23)
CHLORIDE: 98 meq/L (ref 96–112)
CO2: 28 mEq/L (ref 19–32)
Calcium: 9 mg/dL (ref 8.4–10.5)
Creatinine, Ser: 2.52 mg/dL — ABNORMAL HIGH (ref 0.50–1.35)
GFR calc non Af Amer: 27 mL/min — ABNORMAL LOW (ref >90)
GFR, EST AFRICAN AMERICAN: 32 mL/min — AB (ref >90)
Glucose, Bld: 128 mg/dL — ABNORMAL HIGH (ref 70–99)
Potassium: 3.3 mEq/L — ABNORMAL LOW (ref 3.7–5.3)
Sodium: 139 mEq/L (ref 137–147)

## 2013-12-21 LAB — CBC
HCT: 40 % (ref 39.0–52.0)
Hemoglobin: 12.4 g/dL — ABNORMAL LOW (ref 13.0–17.0)
MCH: 25.2 pg — ABNORMAL LOW (ref 26.0–34.0)
MCHC: 31 g/dL (ref 30.0–36.0)
MCV: 81.1 fL (ref 78.0–100.0)
PLATELETS: 227 10*3/uL (ref 150–400)
RBC: 4.93 MIL/uL (ref 4.22–5.81)
RDW: 17.5 % — ABNORMAL HIGH (ref 11.5–15.5)
WBC: 11.4 10*3/uL — AB (ref 4.0–10.5)

## 2013-12-21 MED ORDER — DSS 100 MG PO CAPS: 100.0000 mg | ORAL_CAPSULE | Freq: Two times a day (BID) | ORAL | Status: DC

## 2013-12-21 MED ORDER — OXYCODONE-ACETAMINOPHEN 5-325 MG PO TABS: 1.0000 | ORAL_TABLET | ORAL | Status: DC | PRN

## 2013-12-21 MED ORDER — ALLOPURINOL 300 MG PO TABS: 300.0000 mg | ORAL_TABLET | Freq: Every day | ORAL | Status: DC

## 2013-12-21 NOTE — Progress Notes (Signed)
Checked on pt during hourly rounding, pt pointed out IV came out. Catheter and tape holding IV in place was intact but not in vein.  Did not appear to be tampered with. Pt stated he did not want a new IV started. Will continue to monitor pt.

## 2013-12-21 NOTE — Discharge Summary (Signed)
Physician Discharge Summary  Patient ID: [name] MRN: [id number] DOB/AGE: [birth year] 53 y.o.  Admit date: 12/18/2013 Discharge date: 12/21/2013  Admission Diagnoses: Prostate cancer  Discharge Diagnoses:  Active Problems:   Prostate cancer   Discharged Condition: good  Hospital Course: Patient admitted following robot-assisted laparoscopic radical prostatectomy with bilateral pelvic lymphadenectomy. Initially had some abdominal pain which was in the right midabdomen surrounding one of the port sites. This was complicated by postoperative ileus and improved after he began passing a good amount of flatus on the evening of postoperative day #2 and morning of postoperative day #3. By postoperative day #3 he was tolerating a regular diet, ambulating without difficulty and had adequate pain control by mouth pain medication. He was felt to be stable for discharge. Overall his CBC and BMP appeared stable. He did notice this morning some pain in his right ankle consistent with his past gout attacks. He requested a refill of his allopurinol until he can followup with his primary care physician Dr. Mancel Bale. He has no pain or swelling in the right leg, knee or calf.  Consults: None  Significant Diagnostic Studies: none   Treatments: surgery: Robotic assisted laparoscopic radical prostatectomy with  bilateral pelvic lymphadenectomy and injection of ICG dye for sentinel  Lymphangiography. Dec 18, 2013   Discharge Exam: Blood pressure 141/102, pulse 93, temperature 98 F (36.7 C), temperature source Oral, resp. rate 16, height 5\' 11"  (1.803 m), weight 133.358 kg (294 lb), SpO2 91.00%. In no acute distress Lying in bed in no acute distress Cardiovascular regular rate and rhythm Lungs are clear to auscultation bilaterally Abdomen: Soft and nontender, seems less distended than yesterday. Minimal pain around port sites. Excellent bowel sounds. Extremities-no pain or swelling in the right or left  lower extremities (thigh, knee or calf), some pain on palpation of the right ankle. No obvious swelling. GU: Foley catheter in place, urine clear  Disposition: 01-Home or Self Care     Medication List    STOP taking these medications       finasteride 5 MG tablet  Commonly known as:  PROSCAR     tamsulosin 0.4 MG Caps capsule  Commonly known as:  FLOMAX      TAKE these medications       allopurinol 300 MG tablet  Commonly known as:  ZYLOPRIM  Take 1 tablet (300 mg total) by mouth daily.     amLODipine 10 MG tablet  Commonly known as:  NORVASC  Take 10 mg by mouth every morning.     aspirin EC 81 MG tablet  Take 81 mg by mouth daily.     DSS 100 MG Caps  Take 100 mg by mouth 2 (two) times daily.     oxyCODONE-acetaminophen 5-325 MG per tablet  Commonly known as:  PERCOCET/ROXICET  Take 1-2 tablets by mouth every 4 (four) hours as needed for severe pain.     rosuvastatin 10 MG tablet  Commonly known as:  CRESTOR  Take 10 mg by mouth every other day.     senna-docusate 8.6-50 MG per tablet  Commonly known as:  Senokot-S  Take 1 tablet by mouth daily.           Follow-up Information   Follow up with Alexis Frock, MD On 12/25/2013. (at 10:15 AM for MD visit and catheter removal)    Specialty:  Urology   Contact information:   [place] [address] [phone]       Signed:  Festus Aloe 12/21/2013, 10:17 AM

## 2013-12-21 NOTE — Progress Notes (Signed)
Pt left at this time with his spouse at his side. Alert, oriented, and without c/o. Discharge instructions/prescriptions given/explained with pt and spouse verbalizing understanding. Followup appointment noted for Dr.Manny. Leg bag teaching done and all supplies given. Foley catheter intact. Glasses and cell phone with pt.

## 2013-12-22 ENCOUNTER — Encounter (HOSPITAL_COMMUNITY): Payer: Self-pay | Admitting: Urology

## 2013-12-22 NOTE — Anesthesia Postprocedure Evaluation (Signed)
Anesthesia Post Note  Patient: [name]  Procedure(s) Performed: Procedure(s) (LRB): ROBOTIC ASSISTED LAPAROSCOPIC RADICAL PROSTATECTOMY AND INDOCYANINE GREEN DYE (N/A) PELVIC LYMPH NODE DISSECTION (Bilateral)  Anesthesia type: General  Patient location: PACU  Post pain: Pain level controlled  Post assessment: Post-op Vital signs reviewed  Last Vitals: BP 152/96  Pulse 101  Temp(Src) 37.4 C (Oral)  Resp 16  Ht 5\' 11"  (1.803 m)  Wt 294 lb (133.358 kg)  BMI 41.02 kg/m2  SpO2 94%  Post vital signs: Reviewed  Level of consciousness: sedated  Complications: No apparent anesthesia complications

## 2014-01-10 ENCOUNTER — Emergency Department (HOSPITAL_COMMUNITY): Payer: 59

## 2014-01-10 ENCOUNTER — Encounter (HOSPITAL_COMMUNITY): Payer: Self-pay | Admitting: Emergency Medicine

## 2014-01-10 ENCOUNTER — Emergency Department (HOSPITAL_COMMUNITY)
Admission: EM | Admit: 2014-01-10 | Discharge: 2014-01-10 | Disposition: A | Discharge status: Home / Self Care | Payer: 59 | Attending: Emergency Medicine | Admitting: Emergency Medicine

## 2014-01-10 DIAGNOSIS — Z9889 Other specified postprocedural states: Secondary | ICD-10-CM | POA: Insufficient documentation

## 2014-01-10 DIAGNOSIS — E785 Hyperlipidemia, unspecified: Secondary | ICD-10-CM | POA: Insufficient documentation

## 2014-01-10 DIAGNOSIS — Z79899 Other long term (current) drug therapy: Secondary | ICD-10-CM | POA: Insufficient documentation

## 2014-01-10 DIAGNOSIS — R109 Unspecified abdominal pain: Secondary | ICD-10-CM | POA: Diagnosis present

## 2014-01-10 DIAGNOSIS — N183 Chronic kidney disease, stage 3 unspecified: Secondary | ICD-10-CM | POA: Insufficient documentation

## 2014-01-10 DIAGNOSIS — IMO0001 Reserved for inherently not codable concepts without codable children: Secondary | ICD-10-CM | POA: Insufficient documentation

## 2014-01-10 DIAGNOSIS — Z8719 Personal history of other diseases of the digestive system: Secondary | ICD-10-CM | POA: Insufficient documentation

## 2014-01-10 DIAGNOSIS — G4733 Obstructive sleep apnea (adult) (pediatric): Secondary | ICD-10-CM | POA: Insufficient documentation

## 2014-01-10 DIAGNOSIS — G8918 Other acute postprocedural pain: Secondary | ICD-10-CM | POA: Diagnosis not present

## 2014-01-10 DIAGNOSIS — E119 Type 2 diabetes mellitus without complications: Secondary | ICD-10-CM | POA: Insufficient documentation

## 2014-01-10 DIAGNOSIS — M7918 Myalgia, other site: Secondary | ICD-10-CM

## 2014-01-10 DIAGNOSIS — M109 Gout, unspecified: Secondary | ICD-10-CM | POA: Diagnosis not present

## 2014-01-10 DIAGNOSIS — M129 Arthropathy, unspecified: Secondary | ICD-10-CM | POA: Insufficient documentation

## 2014-01-10 DIAGNOSIS — M545 Low back pain, unspecified: Secondary | ICD-10-CM | POA: Insufficient documentation

## 2014-01-10 DIAGNOSIS — I129 Hypertensive chronic kidney disease with stage 1 through stage 4 chronic kidney disease, or unspecified chronic kidney disease: Secondary | ICD-10-CM | POA: Diagnosis not present

## 2014-01-10 DIAGNOSIS — Z7982 Long term (current) use of aspirin: Secondary | ICD-10-CM | POA: Diagnosis not present

## 2014-01-10 DIAGNOSIS — Z87448 Personal history of other diseases of urinary system: Secondary | ICD-10-CM | POA: Diagnosis not present

## 2014-01-10 DIAGNOSIS — Z880 Allergy status to penicillin: Secondary | ICD-10-CM | POA: Insufficient documentation

## 2014-01-10 LAB — COMPREHENSIVE METABOLIC PANEL
ALBUMIN: 3.1 g/dL — AB (ref 3.5–5.2)
ALK PHOS: 83 U/L (ref 39–117)
ALT: 8 U/L (ref 0–53)
AST: 11 U/L (ref 0–37)
Anion gap: 13 (ref 5–15)
BUN: 17 mg/dL (ref 6–23)
CHLORIDE: 103 meq/L (ref 96–112)
CO2: 26 mEq/L (ref 19–32)
CREATININE: 2.16 mg/dL — AB (ref 0.50–1.35)
Calcium: 9.1 mg/dL (ref 8.4–10.5)
GFR calc Af Amer: 38 mL/min — ABNORMAL LOW (ref >90)
GFR calc non Af Amer: 33 mL/min — ABNORMAL LOW (ref >90)
Glucose, Bld: 96 mg/dL (ref 70–99)
POTASSIUM: 3.2 meq/L — AB (ref 3.7–5.3)
Sodium: 142 mEq/L (ref 137–147)
Total Bilirubin: 0.5 mg/dL (ref 0.3–1.2)
Total Protein: 7 g/dL (ref 6.0–8.3)

## 2014-01-10 LAB — CBC WITH DIFFERENTIAL/PLATELET
BASOS ABS: 0 10*3/uL (ref 0.0–0.1)
BASOS PCT: 0 % (ref 0–1)
Eosinophils Absolute: 0.3 10*3/uL (ref 0.0–0.7)
Eosinophils Relative: 6 % — ABNORMAL HIGH (ref 0–5)
HEMATOCRIT: 37.1 % — AB (ref 39.0–52.0)
HEMOGLOBIN: 11.5 g/dL — AB (ref 13.0–17.0)
LYMPHS PCT: 41 % (ref 12–46)
Lymphs Abs: 2.3 10*3/uL (ref 0.7–4.0)
MCH: 24.8 pg — ABNORMAL LOW (ref 26.0–34.0)
MCHC: 31 g/dL (ref 30.0–36.0)
MCV: 80.1 fL (ref 78.0–100.0)
MONO ABS: 0.5 10*3/uL (ref 0.1–1.0)
Monocytes Relative: 9 % (ref 3–12)
NEUTROS ABS: 2.5 10*3/uL (ref 1.7–7.7)
NEUTROS PCT: 44 % (ref 43–77)
Platelets: 296 10*3/uL (ref 150–400)
RBC: 4.63 MIL/uL (ref 4.22–5.81)
RDW: 16.5 % — ABNORMAL HIGH (ref 11.5–15.5)
WBC: 5.6 10*3/uL (ref 4.0–10.5)

## 2014-01-10 MED ORDER — OXYCODONE-ACETAMINOPHEN 5-325 MG PO TABS: 1.0000 | ORAL_TABLET | ORAL | Status: DC | PRN

## 2014-01-10 MED ORDER — OXYCODONE-ACETAMINOPHEN 5-325 MG PO TABS
1.0000 | ORAL_TABLET | Freq: Once | ORAL | Status: AC
  Administered 2014-01-10: 1 via ORAL
  Filled 2014-01-10: qty 1

## 2014-01-10 MED ORDER — METHOCARBAMOL 500 MG PO TABS: 1000.0000 mg | ORAL_TABLET | Freq: Four times a day (QID) | ORAL | Status: DC

## 2014-01-10 NOTE — Discharge Instructions (Signed)
Please read and follow all provided instructions.  Your diagnoses today include:  1. Left-sided low back pain without sciatica   2. Musculoskeletal pain     Tests performed today include:  Blood counts and electrolytes - no concerning findings  X-ray of abdomen - normal  Vital signs. See below for your results today.   Medications prescribed:   Percocet (oxycodone/acetaminophen) - narcotic pain medication  DO NOT drive or perform any activities that require you to be awake and alert because this medicine can make you drowsy. BE VERY CAREFUL not to take multiple medicines containing Tylenol (also called acetaminophen). Doing so can lead to an overdose which can damage your liver and cause liver failure and possibly death.   Robaxin (methocarbamol) - muscle relaxer medication  DO NOT drive or perform any activities that require you to be awake and alert because this medicine can make you drowsy.   Take any prescribed medications only as directed.  Home care instructions:  Follow any educational materials contained in this packet.  BE VERY CAREFUL not to take multiple medicines containing Tylenol (also called acetaminophen). Doing so can lead to an overdose which can damage your liver and cause liver failure and possibly death.   Follow-up instructions: Please follow-up with your primary care provider in the next 3 days for further evaluation of your symptoms.   Return instructions:   Please return to the Emergency Department if you experience worsening symptoms.   Please return if you have any other emergent concerns.  Additional Information:  Your vital signs today were: BP 151/100   Pulse 74   Temp(Src) 99.2 F (37.3 C) (Oral)   Resp 18   SpO2 100% If your blood pressure (BP) was elevated above 135/85 this visit, please have this repeated by your doctor within one month. --------------

## 2014-01-10 NOTE — ED Provider Notes (Signed)
CSN: [id number]     Arrival date & time 01/10/14  1344 History   First MD Initiated Contact with Patient 01/10/14 1515     Chief Complaint  Patient presents with  . Abdominal Pain  . Post-op Problem   (Consider location/radiation/quality/duration/timing/severity/associated sxs/prior Treatment) HPI Comments: Patient with history of robotic-assisted prostatectomy on 12/18/2013 -- presents with 2 days of pain at trocar site R lower abdomen with radiation to mid-back. Pain is sharp, severe, positional. It is worse with bending to the right and better lying in a neutral position. No treatments prior to arrival. No symptoms of bowel obstruction. Last BM yesterday, + flatus, no N/V/D. Continues to have urinary incontinence after surgery. No fever.   Patient is a 53 y.o. male presenting with abdominal pain. The history is provided by the patient and medical records.  Abdominal Pain Associated symptoms: no chest pain, no cough, no diarrhea, no dysuria, no fever, no nausea, no sore throat and no vomiting     Past Medical History  Diagnosis Date  . Hyperlipidemia   . Hypertension   . Gout     STABLE  PER PT 10-22-2013  . BPH (benign prostatic hypertrophy)   . Elevated PSA   . Microhematuria   . Borderline diabetes mellitus   . Nocturia   . Arthritis     SPINE  . Wears glasses   . CKD (chronic kidney disease), stage III   . OSA (obstructive sleep apnea)     PER PT STUDY DONE 2005 (APPROX).  NON- COMPLIANT CPAP  . H/O hiatal hernia   . Diabetes mellitus without complication     borderline on no meds    Past Surgical History  Procedure Laterality Date  . Transthoracic echocardiogram  07-03-2006    MILD - MODERATE LVH/  EF 55-60%/  GRADE I DIASTOIC DYSFUNCTION/  TRIVIAL  TR /  TRIVIAL PERICARDIAL EFFUSION  . Right hand surgery  YRS AGO  . Cardiac catheterization  10-04-2006   DR [facility]    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%/  2ndDIAGONAL 30%/  RCA 40%/  EF 60%  . Cardiac catheterization   04-07-2002  DR [facility]    NON-CRITICAL CAD/  PRESERVED LV  . Prostate biopsy N/A 10/27/2013    Procedure: BIOPSY TRANSRECTAL ULTRASONIC PROSTATE (TUBP);  Surgeon: Molli Hazard, MD;  Location: [facility];  Service: Urology;  Laterality: N/A;  . Cystoscopy w/ retrogrades Bilateral 10/27/2013    Procedure: CYSTOSCOPY WITH RETROGRADE PYELOGRAM;  Surgeon: Molli Hazard, MD;  Location: [facility];  Service: Urology;  Laterality: Bilateral;  . Robot assisted laparoscopic radical prostatectomy N/A 12/18/2013    Procedure: ROBOTIC ASSISTED LAPAROSCOPIC RADICAL PROSTATECTOMY AND INDOCYANINE GREEN DYE;  Surgeon: Alexis Frock, MD;  Location: [facility];  Service: Urology;  Laterality: N/A;  . Lymphadenectomy Bilateral 12/18/2013    Procedure: PELVIC LYMPH NODE DISSECTION;  Surgeon: Alexis Frock, MD;  Location: [facility];  Service: Urology;  Laterality: Bilateral;   Family History  Problem Relation Age of Onset  . Cirrhosis Father   . Other Mother     shot and killed   History  Substance Use Topics  . Smoking status: Never Smoker   . Smokeless tobacco: Never Used  . Alcohol Use: No    Review of Systems  Constitutional: Negative for fever.  HENT: Negative for rhinorrhea and sore throat.   Eyes: Negative for redness.  Respiratory: Negative for cough.   Cardiovascular: Negative for chest pain.  Gastrointestinal: Positive for abdominal  pain. Negative for nausea, vomiting, diarrhea and blood in stool.  Genitourinary: Positive for enuresis. Negative for dysuria.  Musculoskeletal: Positive for back pain. Negative for myalgias.  Skin: Negative for rash.  Neurological: Negative for headaches.   Allergies  Penicillins  Home Medications   Prior to Admission medications   Medication Sig Start Date End Date Taking? Authorizing Provider  allopurinol (ZYLOPRIM) 300 MG tablet Take 1 tablet (300 mg total) by mouth daily. 12/21/13   Festus Aloe, MD   amLODipine (NORVASC) 10 MG tablet Take 10 mg by mouth every morning.     Historical Provider, MD  aspirin EC 81 MG tablet Take 81 mg by mouth daily.    Historical Provider, MD  docusate sodium 100 MG CAPS Take 100 mg by mouth 2 (two) times daily. 12/21/13   Festus Aloe, MD  oxyCODONE-acetaminophen (PERCOCET/ROXICET) 5-325 MG per tablet Take 1-2 tablets by mouth every 4 (four) hours as needed for severe pain. 12/21/13   Festus Aloe, MD  rosuvastatin (CRESTOR) 10 MG tablet Take 10 mg by mouth every other day.     Historical Provider, MD  senna-docusate (SENOKOT-S) 8.6-50 MG per tablet Take 1 tablet by mouth daily.    Historical Provider, MD   BP 151/100  Pulse 74  Temp(Src) 99.2 F (37.3 C) (Oral)  Resp 18  SpO2 100%  Physical Exam  Nursing note and vitals reviewed. Constitutional: He appears well-developed and well-nourished.  HENT:  Head: Normocephalic and atraumatic.  Eyes: Conjunctivae are normal. Right eye exhibits no discharge. Left eye exhibits no discharge.  Neck: Normal range of motion. Neck supple.  Cardiovascular: Normal rate, regular rhythm and normal heart sounds.   Pulmonary/Chest: Effort normal and breath sounds normal.  Abdominal: Soft. Bowel sounds are normal. There is tenderness. There is no rebound and no guarding.  Small   Neurological: He is alert.  Skin: Skin is warm and dry.  Psychiatric: He has a normal mood and affect.    ED Course  Procedures (including critical care time) Labs Review Labs Reviewed  CBC WITH DIFFERENTIAL - Abnormal; Notable for the following:    Hemoglobin 11.5 (*)    HCT 37.1 (*)    MCH 24.8 (*)    RDW 16.5 (*)    Eosinophils Relative 6 (*)    All other components within normal limits  COMPREHENSIVE METABOLIC PANEL - Abnormal; Notable for the following:    Potassium 3.2 (*)    Creatinine, Ser 2.16 (*)    Albumin 3.1 (*)    GFR calc non Af Amer 33 (*)    GFR calc Af Amer 38 (*)    All other components within normal limits     Imaging Review Dg Abd 2 Views  01/10/2014   CLINICAL DATA:  Evaluate for bowel obstruction, abdominal pain, recent surgery  EXAM: ABDOMEN - 2 VIEW  COMPARISON:  None.  FINDINGS: The bowel gas pattern is normal. There is no evidence of free air. No radio-opaque calculi or other significant radiographic abnormality is seen.  IMPRESSION: Negative.   Electronically Signed   By: Skipper Cliche M.D.   On: 01/10/2014 16:49     EKG Interpretation None      3:45 PM Patient seen and examined. Work-up initiated. Medications ordered.   Vital signs reviewed and are as follows: Filed Vitals:   01/10/14 1358  BP: 151/100  Pulse: 74  Temp: 99.2 F (37.3 C)  Resp: 18   3:45 PM Patient was discussed with Mirna Mires, MD.  Will ask urology to see.   3:57 PM Urology has seen. Doubt post-op complication. Low suspicion for SBO. Will get basic work-up and re-eval.   5:43 PM patient's labs are at baseline. X-ray does not show any signs of obstruction. Discussed once again with Dr. Rossie Muskrat. Patient informed of results. He is feeling somewhat better. Will discharge to home with treatment for musculoskeletal pain.  Patient counseled on use of narcotic pain medications. Counseled not to combine these medications with others containing tylenol. Urged not to drink alcohol, drive, or perform any other activities that requires focus while taking these medications. The patient verbalizes understanding and agrees with the plan.  Patient counseled on proper use of muscle relaxant medication.  They were told not to drink alcohol, drive any vehicle, or do any dangerous activities while taking this medication.  Patient verbalized understanding.  Patient encouraged to followup with urology as planned and to return with worsening symptoms. We discussed signs of obstruction including constipation, no flatus, vomiting, worsening pain.  MDM   Final diagnoses:  Musculoskeletal pain  Right-sided low back pain  without sciatica   Patient with pain after surgery, low suspicion for postsurgical complication per urology. Character is most consistent with musculoskeletal pain. No signs of obstruction given history and x-ray. Labs are at baseline. Patient with good pain control in ED. Will discharge to home with urology followup and symptom management.    Carlisle Cater, PA-C 01/10/14 1747

## 2014-01-10 NOTE — ED Notes (Signed)
Pt states that he had prostate surgery on 7/20.  States that he has been having pain in the incision sites and that one of them is hard and painful.  States that "when the pain hits, it shoots straight through to his back".

## 2014-01-12 NOTE — ED Provider Notes (Signed)
Medical screening examination/treatment/procedure(s) were conducted as a shared visit with non-physician practitioner(s) and myself.  I personally evaluated the patient during the encounter.  Pt c/o right low back pain. 1 month s/p prostatectomy. No hematuria or dysuria. Having normal bms. No abd distension or nv.  No radicular or leg pain. No numbness/weakness. No fever or chills. Spine nt. Right lumbar muscular tenderness.    Mirna Mires, MD 01/12/14 [phone]

## 2014-06-16 ENCOUNTER — Emergency Department (HOSPITAL_COMMUNITY)
Admission: EM | Admit: 2014-06-16 | Discharge: 2014-06-16 | Disposition: A | Discharge status: Home / Self Care | Payer: 59 | Attending: Emergency Medicine | Admitting: Emergency Medicine

## 2014-06-16 ENCOUNTER — Emergency Department (HOSPITAL_COMMUNITY): Payer: 59

## 2014-06-16 ENCOUNTER — Encounter (HOSPITAL_COMMUNITY): Payer: Self-pay | Admitting: Emergency Medicine

## 2014-06-16 DIAGNOSIS — Z7982 Long term (current) use of aspirin: Secondary | ICD-10-CM | POA: Insufficient documentation

## 2014-06-16 DIAGNOSIS — Z880 Allergy status to penicillin: Secondary | ICD-10-CM | POA: Diagnosis not present

## 2014-06-16 DIAGNOSIS — Z79899 Other long term (current) drug therapy: Secondary | ICD-10-CM | POA: Insufficient documentation

## 2014-06-16 DIAGNOSIS — I129 Hypertensive chronic kidney disease with stage 1 through stage 4 chronic kidney disease, or unspecified chronic kidney disease: Secondary | ICD-10-CM | POA: Insufficient documentation

## 2014-06-16 DIAGNOSIS — M5134 Other intervertebral disc degeneration, thoracic region: Secondary | ICD-10-CM

## 2014-06-16 DIAGNOSIS — M5030 Other cervical disc degeneration, unspecified cervical region: Secondary | ICD-10-CM

## 2014-06-16 DIAGNOSIS — Z9981 Dependence on supplemental oxygen: Secondary | ICD-10-CM | POA: Insufficient documentation

## 2014-06-16 DIAGNOSIS — R202 Paresthesia of skin: Secondary | ICD-10-CM | POA: Insufficient documentation

## 2014-06-16 DIAGNOSIS — Z8719 Personal history of other diseases of the digestive system: Secondary | ICD-10-CM | POA: Diagnosis not present

## 2014-06-16 DIAGNOSIS — R52 Pain, unspecified: Secondary | ICD-10-CM

## 2014-06-16 DIAGNOSIS — G4733 Obstructive sleep apnea (adult) (pediatric): Secondary | ICD-10-CM | POA: Insufficient documentation

## 2014-06-16 DIAGNOSIS — R200 Anesthesia of skin: Secondary | ICD-10-CM | POA: Insufficient documentation

## 2014-06-16 DIAGNOSIS — E785 Hyperlipidemia, unspecified: Secondary | ICD-10-CM | POA: Insufficient documentation

## 2014-06-16 DIAGNOSIS — M459 Ankylosing spondylitis of unspecified sites in spine: Secondary | ICD-10-CM | POA: Insufficient documentation

## 2014-06-16 DIAGNOSIS — Z87438 Personal history of other diseases of male genital organs: Secondary | ICD-10-CM | POA: Diagnosis not present

## 2014-06-16 DIAGNOSIS — M5412 Radiculopathy, cervical region: Secondary | ICD-10-CM | POA: Diagnosis not present

## 2014-06-16 DIAGNOSIS — Z973 Presence of spectacles and contact lenses: Secondary | ICD-10-CM | POA: Diagnosis not present

## 2014-06-16 DIAGNOSIS — M109 Gout, unspecified: Secondary | ICD-10-CM | POA: Insufficient documentation

## 2014-06-16 DIAGNOSIS — N183 Chronic kidney disease, stage 3 (moderate): Secondary | ICD-10-CM | POA: Diagnosis not present

## 2014-06-16 DIAGNOSIS — M5033 Other cervical disc degeneration, cervicothoracic region: Secondary | ICD-10-CM | POA: Diagnosis not present

## 2014-06-16 DIAGNOSIS — M546 Pain in thoracic spine: Secondary | ICD-10-CM | POA: Diagnosis present

## 2014-06-16 MED ORDER — HYDROCODONE-ACETAMINOPHEN 5-325 MG PO TABS: 1.0000 | ORAL_TABLET | ORAL | Status: DC | PRN

## 2014-06-16 MED ORDER — HYDROCODONE-ACETAMINOPHEN 5-325 MG PO TABS
1.0000 | ORAL_TABLET | Freq: Once | ORAL | Status: AC
  Administered 2014-06-16: 1 via ORAL
  Filled 2014-06-16: qty 1

## 2014-06-16 MED ORDER — CYCLOBENZAPRINE HCL 10 MG PO TABS: 10.0000 mg | ORAL_TABLET | Freq: Three times a day (TID) | ORAL | Status: DC | PRN

## 2014-06-16 NOTE — ED Provider Notes (Signed)
CSN: [id number]     Arrival date & time 06/16/14  2011 History  This chart was scribed for non-physician practitioner, Clayton Bibles, PA-C, working with Julianne Rice, MD, by Delphia Grates, ED Scribe. This patient was seen in room TR11C/TR11C and the patient's care was started at 9:46 PM.   Chief Complaint  Patient presents with  . Back Pain    The history is provided by the patient. No language interpreter was used.     HPI Comments: NALU LABA is a 53 y.o. male, with history of HTN, DM, CKD, HLD, and arthritis, who presents to the Emergency Department complaining of constant, 9/10, aching, left upper back pain that began 4 days ago. Patient states the pain radiates to his left shoulder and left arm. He denies any recent heavy lifting, falls, injury, or trauma. He states he was in the bed when the pain started and reports it woke him from sleep. There is associated left sided neck pain, numbness/tingling on all fingers of left hand, and left arm weakness. Patient states he has been unable to lay on his left side due to pain. He states he is able to ambulate without complications. He reports taking lorazepam and feeling woozy afterward. Patient denies history of recent neck injury. He denies chest pain, SOB, numbness/weakness in bilateral lower extremities, loss of control of bowel or bladder, saddle anesthesia, abdominal pain, nausea, vomiting, or diarrhea.    Past Medical History  Diagnosis Date  . Hyperlipidemia   . Hypertension   . Gout     STABLE  PER PT 10-22-2013  . BPH (benign prostatic hypertrophy)   . Elevated PSA   . Microhematuria   . Borderline diabetes mellitus   . Nocturia   . Arthritis     SPINE  . Wears glasses   . CKD (chronic kidney disease), stage III   . OSA (obstructive sleep apnea)     PER PT STUDY DONE 2005 (APPROX).  NON- COMPLIANT CPAP  . H/O hiatal hernia   . Diabetes mellitus without complication     borderline on no meds    Past Surgical History   Procedure Laterality Date  . Transthoracic echocardiogram  07-03-2006    MILD - MODERATE LVH/  EF 55-60%/  GRADE I DIASTOIC DYSFUNCTION/  TRIVIAL  TR /  TRIVIAL PERICARDIAL EFFUSION  . Right hand surgery  YRS AGO  . Cardiac catheterization  10-04-2006   DR [facility]    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%/  2ndDIAGONAL 30%/  RCA 40%/  EF 60%  . Cardiac catheterization  04-07-2002  DR [facility]    NON-CRITICAL CAD/  PRESERVED LV  . Prostate biopsy N/A 10/27/2013    Procedure: BIOPSY TRANSRECTAL ULTRASONIC PROSTATE (TUBP);  Surgeon: Molli Hazard, MD;  Location: [facility];  Service: Urology;  Laterality: N/A;  . Cystoscopy w/ retrogrades Bilateral 10/27/2013    Procedure: CYSTOSCOPY WITH RETROGRADE PYELOGRAM;  Surgeon: Molli Hazard, MD;  Location: [facility];  Service: Urology;  Laterality: Bilateral;  . Robot assisted laparoscopic radical prostatectomy N/A 12/18/2013    Procedure: ROBOTIC ASSISTED LAPAROSCOPIC RADICAL PROSTATECTOMY AND INDOCYANINE GREEN DYE;  Surgeon: Alexis Frock, MD;  Location: [facility];  Service: Urology;  Laterality: N/A;  . Lymphadenectomy Bilateral 12/18/2013    Procedure: PELVIC LYMPH NODE DISSECTION;  Surgeon: Alexis Frock, MD;  Location: [facility];  Service: Urology;  Laterality: Bilateral;   Family History  Problem Relation Age of Onset  . Cirrhosis Father   . Other  Mother     shot and killed   History  Substance Use Topics  . Smoking status: Never Smoker   . Smokeless tobacco: Never Used  . Alcohol Use: No    Review of Systems  Constitutional: Negative for fever.  Gastrointestinal: Negative for nausea, vomiting, abdominal pain and diarrhea.  Musculoskeletal: Positive for myalgias (left arm), back pain (left; upper) and neck pain.  Neurological: Positive for weakness and numbness.  All other systems reviewed and are negative.     Allergies  Penicillins  Home Medications   Prior to Admission medications    Medication Sig Start Date End Date Taking? Authorizing Provider  allopurinol (ZYLOPRIM) 300 MG tablet Take 1 tablet (300 mg total) by mouth daily. 12/21/13  Yes Festus Aloe, MD  amLODipine (NORVASC) 10 MG tablet Take 10 mg by mouth every morning.    Yes Historical Provider, MD  aspirin EC 81 MG tablet Take 81 mg by mouth daily.   Yes Historical Provider, MD  rosuvastatin (CRESTOR) 10 MG tablet Take 10 mg by mouth every other day.    Yes Historical Provider, MD  docusate sodium 100 MG CAPS Take 100 mg by mouth 2 (two) times daily. 12/21/13   Festus Aloe, MD  methocarbamol (ROBAXIN) 500 MG tablet Take 2 tablets (1,000 mg total) by mouth 4 (four) times daily. Patient not taking: Reported on 06/16/2014 01/10/14   Carlisle Cater, PA-C  oxyCODONE-acetaminophen (PERCOCET/ROXICET) 5-325 MG per tablet Take 1 tablet by mouth every 4 (four) hours as needed for severe pain. Patient not taking: Reported on 06/16/2014 01/10/14   Carlisle Cater, PA-C  senna-docusate (SENOKOT-S) 8.6-50 MG per tablet Take 1 tablet by mouth daily.    Historical Provider, MD   Triage Vitals: BP 164/109 mmHg  Pulse 98  Temp(Src) 98.9 F (37.2 C) (Oral)  Resp 18  SpO2 95%  Physical Exam  Constitutional: He appears well-developed and well-nourished. No distress.  HENT:  Head: Normocephalic and atraumatic.  Neck: Neck supple.  Pulmonary/Chest: Effort normal.  Abdominal: Soft. He exhibits no distension. There is no tenderness. There is no rebound and no guarding.  Musculoskeletal: He exhibits tenderness.       Back:  Spine nontender, no crepitus, or stepoffs. Upper extremities:  Strength 5/5, sensation intact, distal pulses intact.   Left upper back is tender.   Neurological: He is alert.  Skin: He is not diaphoretic.  Nursing note and vitals reviewed.   ED Course  Procedures (including critical care time)  DIAGNOSTIC STUDIES: Oxygen Saturation is 95% on room air, adequate by my interpretation.    COORDINATION  OF CARE: At 2154 Discussed treatment plan with patient which includes imaging and pain medication. Patient agrees.   Labs Review Labs Reviewed - No data to display  Imaging Review Dg Cervical Spine Complete  06/16/2014   CLINICAL DATA:  Woke with left shoulder pain 5 days ago. No known trauma.  EXAM: CERVICAL SPINE  4+ VIEWS  COMPARISON:  None.  FINDINGS: Exam detail is diminished due to patient's body habitus. C6, C7 and the cervical thoracic junction is not well visualized. The alignment appears normal. There is multi level disc space narrowing and ventral endplate spurring identified. No fractures noted. Evidence of mild bilateral neuroforaminal narrowing is noted.  IMPRESSION: 1. Exam detail is diminished due to patient's body habitus. C6 through the cervical thoracic junction is not well visualized. 2. Degenerative disc disease.   Electronically Signed   By: Kerby Moors M.D.   On: 06/16/2014 22:29  Dg Thoracic Spine 2 View  06/16/2014   CLINICAL DATA:  Left shoulder pain with no known trauma.  EXAM: THORACIC SPINE - 2 VIEW  COMPARISON:  None.  FINDINGS: Lateral imaging of the upper and lower thoracic spine is limited by overlapping soft tissues.  There is no evidence of fracture or traumatic malalignment. No evidence of focal bone lesion or endplate erosion. Degenerative disc narrowing and endplate spurring noted in the mid and lower thoracic spine.  IMPRESSION: 1. No acute osseous findings. 2. Mid and lower thoracic degenerative disc disease.   Electronically Signed   By: Jorje Guild M.D.   On: 06/16/2014 22:29     EKG Interpretation None      MDM   Final diagnoses:  Pain  Degenerative disc disease, cervical  Degenerative disc disease, thoracic  Cervical radiculopathy    Afebrile, nontoxic patient with left upper back pain and left arm pain, left hand paresthesias.  Neurovascularly intact.  No bony tenderness of spine.  Xrays show DDD and multilevel disc space narrowing.   Possibility of cervical radiculopathy.  Definite muscle spasm/pain in left upper back.   D/C home with norco, flexeril, PCP follow up.   Discussed result, findings, treatment, and follow up  with patient.  Pt given return precautions.  Pt verbalizes understanding and agrees with plan.       I personally performed the services described in this documentation, which was scribed in my presence. The recorded information has been reviewed and is accurate.    Clayton Bibles, PA-C 06/16/14 2303  Julianne Rice, MD 06/16/14 [phone]

## 2014-06-16 NOTE — Discharge Instructions (Signed)
Read the information below.  Use the prescribed medication as directed.  Please discuss all new medications with your pharmacist.  Do not take additional tylenol while taking the prescribed pain medication to avoid overdose.  You may return to the Emergency Department at any time for worsening condition or any new symptoms that concern you.   If you develop fevers, loss of control of bowel or bladder, weakness or numbness in your arms and legs, or are unable to walk, return to the ER for a recheck.    Cervical Radiculopathy Cervical radiculopathy happens when a nerve in the neck is pinched or bruised by a slipped (herniated) disk or by arthritic changes in the bones of the cervical spine. This can occur due to an injury or as part of the normal aging process. Pressure on the cervical nerves can cause pain or numbness that runs from your neck all the way down into your arm and fingers. CAUSES  There are many possible causes, including:  Injury.  Muscle tightness in the neck from overuse.  Swollen, painful joints (arthritis).  Breakdown or degeneration in the bones and joints of the spine (spondylosis) due to aging.  Bone spurs that may develop near the cervical nerves. SYMPTOMS  Symptoms include pain, weakness, or numbness in the affected arm and hand. Pain can be severe or irritating. Symptoms may be worse when extending or turning the neck. DIAGNOSIS  Your caregiver will ask about your symptoms and do a physical exam. He or she may test your strength and reflexes. X-rays, CT scans, and MRI scans may be needed in cases of injury or if the symptoms do not go away after a period of time. Electromyography (EMG) or nerve conduction testing may be done to study how your nerves and muscles are working. TREATMENT  Your caregiver may recommend certain exercises to help relieve your symptoms. Cervical radiculopathy can, and often does, get better with time and treatment. If your problems continue,  treatment options may include:  Wearing a soft collar for short periods of time.  Physical therapy to strengthen the neck muscles.  Medicines, such as nonsteroidal anti-inflammatory drugs (NSAIDs), oral corticosteroids, or spinal injections.  Surgery. Different types of surgery may be done depending on the cause of your problems. HOME CARE INSTRUCTIONS   Put ice on the affected area.  Put ice in a plastic bag.  Place a towel between your skin and the bag.  Leave the ice on for 15-20 minutes, 03-04 times a day or as directed by your caregiver.  If ice does not help, you can try using heat. Take a warm shower or bath, or use a hot water bottle as directed by your caregiver.  You may try a gentle neck and shoulder massage.  Use a flat pillow when you sleep.  Only take over-the-counter or prescription medicines for pain, discomfort, or fever as directed by your caregiver.  If physical therapy was prescribed, follow your caregiver's directions.  If a soft collar was prescribed, use it as directed. SEEK IMMEDIATE MEDICAL CARE IF:   Your pain gets much worse and cannot be controlled with medicines.  You have weakness or numbness in your hand, arm, face, or leg.  You have a high fever or a stiff, rigid neck.  You lose bowel or bladder control (incontinence).  You have trouble with walking, balance, or speaking. MAKE SURE YOU:   Understand these instructions.  Will watch your condition.  Will get help right away if you are  not doing well or get worse. Document Released: 02/28/2001 Document Revised: 08/28/2011 Document Reviewed: 01/17/2011 [facility] Patient Information 2015 [place], [place]. This information is not intended to replace advice given to you by your health care provider. Make sure you discuss any questions you have with your health care provider.

## 2014-06-16 NOTE — ED Notes (Signed)
Pt. reports left upper back muscle  pain and left arm pain onset 5 days ago , denies injury or fall , pain worse with movement , certain positions and when lying on his left side. Respirations unlabored / ambulatory.

## 2014-07-06 ENCOUNTER — Other Ambulatory Visit: Payer: Self-pay | Admitting: Specialist

## 2014-07-06 DIAGNOSIS — M542 Cervicalgia: Secondary | ICD-10-CM

## 2014-08-03 ENCOUNTER — Other Ambulatory Visit: Payer: Self-pay

## 2014-08-11 ENCOUNTER — Ambulatory Visit
Admission: RE | Admit: 2014-08-11 | Discharge: 2014-08-11 | Disposition: A | Discharge status: Home / Self Care | Payer: 59 | Source: Ambulatory Visit | Attending: Specialist | Admitting: Specialist

## 2014-08-11 DIAGNOSIS — M542 Cervicalgia: Secondary | ICD-10-CM

## 2014-09-18 ENCOUNTER — Encounter (HOSPITAL_COMMUNITY): Payer: Self-pay | Admitting: *Deleted

## 2014-09-18 ENCOUNTER — Emergency Department (HOSPITAL_COMMUNITY)
Admission: EM | Admit: 2014-09-18 | Discharge: 2014-09-19 | Disposition: A | Discharge status: Home / Self Care | Payer: 59 | Attending: Emergency Medicine | Admitting: Emergency Medicine

## 2014-09-18 DIAGNOSIS — E119 Type 2 diabetes mellitus without complications: Secondary | ICD-10-CM | POA: Diagnosis not present

## 2014-09-18 DIAGNOSIS — E785 Hyperlipidemia, unspecified: Secondary | ICD-10-CM | POA: Insufficient documentation

## 2014-09-18 DIAGNOSIS — Z880 Allergy status to penicillin: Secondary | ICD-10-CM | POA: Diagnosis not present

## 2014-09-18 DIAGNOSIS — Z8669 Personal history of other diseases of the nervous system and sense organs: Secondary | ICD-10-CM | POA: Diagnosis not present

## 2014-09-18 DIAGNOSIS — N50819 Testicular pain, unspecified: Secondary | ICD-10-CM

## 2014-09-18 DIAGNOSIS — M1990 Unspecified osteoarthritis, unspecified site: Secondary | ICD-10-CM | POA: Insufficient documentation

## 2014-09-18 DIAGNOSIS — N189 Chronic kidney disease, unspecified: Secondary | ICD-10-CM | POA: Insufficient documentation

## 2014-09-18 DIAGNOSIS — N451 Epididymitis: Secondary | ICD-10-CM | POA: Diagnosis not present

## 2014-09-18 DIAGNOSIS — Z7982 Long term (current) use of aspirin: Secondary | ICD-10-CM | POA: Diagnosis not present

## 2014-09-18 DIAGNOSIS — Z87438 Personal history of other diseases of male genital organs: Secondary | ICD-10-CM | POA: Insufficient documentation

## 2014-09-18 DIAGNOSIS — M109 Gout, unspecified: Secondary | ICD-10-CM | POA: Insufficient documentation

## 2014-09-18 DIAGNOSIS — Z79899 Other long term (current) drug therapy: Secondary | ICD-10-CM | POA: Insufficient documentation

## 2014-09-18 DIAGNOSIS — M79604 Pain in right leg: Secondary | ICD-10-CM | POA: Diagnosis not present

## 2014-09-18 DIAGNOSIS — N183 Chronic kidney disease, stage 3 (moderate): Secondary | ICD-10-CM | POA: Insufficient documentation

## 2014-09-18 DIAGNOSIS — Z8719 Personal history of other diseases of the digestive system: Secondary | ICD-10-CM | POA: Diagnosis not present

## 2014-09-18 DIAGNOSIS — N179 Acute kidney failure, unspecified: Secondary | ICD-10-CM | POA: Diagnosis not present

## 2014-09-18 DIAGNOSIS — I129 Hypertensive chronic kidney disease with stage 1 through stage 4 chronic kidney disease, or unspecified chronic kidney disease: Secondary | ICD-10-CM | POA: Insufficient documentation

## 2014-09-18 DIAGNOSIS — Z973 Presence of spectacles and contact lenses: Secondary | ICD-10-CM | POA: Diagnosis not present

## 2014-09-18 MED ORDER — OXYCODONE-ACETAMINOPHEN 5-325 MG PO TABS
1.0000 | ORAL_TABLET | Freq: Once | ORAL | Status: AC
  Administered 2014-09-18: 1 via ORAL
  Filled 2014-09-18: qty 1

## 2014-09-18 NOTE — ED Provider Notes (Signed)
This chart was scribed for Jonathan Grande, DO by Delphia Grates, ED Scribe. This patient was seen in room A03C/A03C and the patient's care was started at 11:59 PM.  TIME SEEN: 2359  CHIEF COMPLAINT: Leg pain; Groin pain  HPI:   HPI Comments: [name] is a 54 y.o. male, with history of HLD, HTN, gout, BPH, CKD, DM, who presents to the Emergency Department complaining of moderate-severe pain to the back of the right lower leg that radiates to the groin area onset this morning. He notes associated testicular pain and hematuria. Patient is unsure of fever, but has a triage temp of 100.2 F. He reports generalized weakness and states he "does not feel well". He reports pain with ambulation and states is unable to sleep due to pain. He denies prior injury or trauma. Patient mentions history of prostate cancer and reports having a prostatectomy. Patient is also complaining of diffuse itching and "dark spots" to the inguinal area. He denies sick contacts or history of PE/DVT. He further denies nausea, vomiting, diarrhea, or cough.   ROS: See HPI Constitutional: no fever  Eyes: no drainage  ENT: no runny nose   Cardiovascular:  no chest pain  Resp: no SOB  GI: no vomiting GU: no dysuria Integumentary: no rash  Allergy: no hives  Musculoskeletal: no leg swelling  Neurological: no slurred speech ROS otherwise negative  PAST MEDICAL HISTORY/PAST SURGICAL HISTORY:  Past Medical History  Diagnosis Date  . Hyperlipidemia   . Hypertension   . Gout     STABLE  PER PT 10-22-2013  . BPH (benign prostatic hypertrophy)   . Elevated PSA   . Microhematuria   . Borderline diabetes mellitus   . Nocturia   . Arthritis     SPINE  . Wears glasses   . CKD (chronic kidney disease), stage III   . OSA (obstructive sleep apnea)     PER PT STUDY DONE 2005 (APPROX).  NON- COMPLIANT CPAP  . H/O hiatal hernia   . Diabetes mellitus without complication     borderline on no meds     MEDICATIONS:   Prior to Admission medications   Medication Sig Start Date End Date Taking? Authorizing Provider  allopurinol (ZYLOPRIM) 300 MG tablet Take 1 tablet (300 mg total) by mouth daily. 12/21/13   Festus Aloe, MD  amLODipine (NORVASC) 10 MG tablet Take 10 mg by mouth every morning.     Historical Provider, MD  aspirin EC 81 MG tablet Take 81 mg by mouth daily.    Historical Provider, MD  cyclobenzaprine (FLEXERIL) 10 MG tablet Take 1 tablet (10 mg total) by mouth 3 (three) times daily as needed for muscle spasms (or pain). 06/16/14   Clayton Bibles, PA-C  docusate sodium 100 MG CAPS Take 100 mg by mouth 2 (two) times daily. 12/21/13   Festus Aloe, MD  HYDROcodone-acetaminophen (NORCO/VICODIN) 5-325 MG per tablet Take 1 tablet by mouth every 4 (four) hours as needed. 06/16/14   Clayton Bibles, PA-C  methocarbamol (ROBAXIN) 500 MG tablet Take 2 tablets (1,000 mg total) by mouth 4 (four) times daily. Patient not taking: Reported on 06/16/2014 01/10/14   Carlisle Cater, PA-C  rosuvastatin (CRESTOR) 10 MG tablet Take 10 mg by mouth every other day.     Historical Provider, MD  senna-docusate (SENOKOT-S) 8.6-50 MG per tablet Take 1 tablet by mouth daily.    Historical Provider, MD    ALLERGIES:  Allergies  Allergen Reactions  . Penicillins Nausea And  Vomiting and Rash    SOCIAL HISTORY:  History  Substance Use Topics  . Smoking status: Never Smoker   . Smokeless tobacco: Never Used  . Alcohol Use: No    FAMILY HISTORY: Family History  Problem Relation Age of Onset  . Cirrhosis Father   . Other Mother     shot and killed    EXAM:  Triage Vitals: BP 119/100 mmHg  Pulse 97  Temp(Src) 100.2 F (37.9 C) (Oral)  Resp 18  Ht 5\' 11"  (1.803 m)  Wt 276 lb (125.193 kg)  BMI 38.51 kg/m2  SpO2 99%  CONSTITUTIONAL: Alert and oriented and responds appropriately to questions. Well-appearing; well-nourished HEAD: Normocephalic EYES: Conjunctivae clear, PERRL ENT: normal nose; no rhinorrhea; moist  mucous membranes; pharynx without lesions noted NECK: Supple, no meningismus, no LAD  CARD: RRR; S1 and S2 appreciated; no murmurs, no clicks, no rubs, no gallops RESP: Normal chest excursion without splinting or tachypnea; breath sounds clear and equal bilaterally; no wheezes, no rhonchi, no rales, no hypoxia ABD/GI: Normal bowel sounds; non-distended; soft, non-tender, no rebound, no guarding GU: Circumcised male. Normal urethral meatus without blood or discharge. Right testicular pain without masses. Normal left testicle that is nontender. Normal cremasteric reflex bilaterally. No scrotal masses. 2+ femoral pulses bilaterally. BACK:  The back appears normal and is non-tender to palpation, there is no CVA tenderness EXT: Normal ROM in all joints; no edema; normal capillary refill; no cyanosis. Pain in the posterior right calf without obvious swelling. 2+ DP pulses. Compartments soft. No joint effusion, erythema, or warmth.    SKIN: Normal color for age and race; warm. 1cm slightly raised pruritic areas noted to bilateral inguinal areas without drainage or surrounding erythema or warmth. No rash on palms or soles. No lesions in between in the patient's digits. No blisters or desquamation. No petechiae. No purpura. NEURO: Moves all extremities equally PSYCH: The patient's mood and manner are appropriate. Grooming and personal hygiene are appropriate.  MEDICAL DECISION MAKING: Pt here with R leg and R testicular pain.  Scrotal US shows R epididymitis.  He is sexually active only with his wife.  No h/o STDs.  UA shows hematuria without other sign on infection.  WIll dc on Levaquin and with pain meds.  Pt also with R calf pain with no sig on infection.  NV intact distally.  Unable to obtain doppler at this hour.  Will give lovenox and order venous doppler for AM.  No CP or SOB.  Also c/o gen weakness.  Labs, EKG unremarkable other than acute on chronic renal failure.  PT rcvd IVF in ED.  WIll have him  increase fluid intake and f/u with his PCP.  No V/D.  Discussed avoiding nephrotoxic agents.     Discussed return precautions.  Pt verbalized understanding and is comfortable with plan.     EKG Interpretation  Date/Time:  Saturday September 19 2014 00:12:46 EDT Ventricular Rate:  102 PR Interval:  181 QRS Duration: 96 QT Interval:  422 QTC Calculation: 550 R Axis:   -9 Text Interpretation:  Sinus tachycardia Supraventricular bigeminy Probable left atrial enlargement Abnormal R-wave progression, early transition LVH with secondary repolarization abnormality Prolonged QT interval Confirmed by Wyvonnia Dusky  MD, STEPHEN [phone]) on 09/19/2014 12:20:55 AM        I personally performed the services described in this documentation, which was scribed in my presence. The recorded information has been reviewed and is accurate.   [place], DO 09/19/14 [phone]

## 2014-09-18 NOTE — ED Notes (Signed)
Pt c/o leg pain radiating to groin that started this morning. Has not taken anything at home.

## 2014-09-18 NOTE — ED Notes (Signed)
Pt also c/o not being able to sleep for several weeks d/t itching. States that he has noticed several abscesses around his body.

## 2014-09-19 ENCOUNTER — Ambulatory Visit (HOSPITAL_COMMUNITY): Payer: 59 | Attending: Emergency Medicine

## 2014-09-19 ENCOUNTER — Emergency Department (HOSPITAL_COMMUNITY): Payer: 59

## 2014-09-19 LAB — URINALYSIS, ROUTINE W REFLEX MICROSCOPIC
BILIRUBIN URINE: NEGATIVE
GLUCOSE, UA: NEGATIVE mg/dL
KETONES UR: NEGATIVE mg/dL
Leukocytes, UA: NEGATIVE
NITRITE: NEGATIVE
Protein, ur: 100 mg/dL — AB
Specific Gravity, Urine: 1.02 (ref 1.005–1.030)
Urobilinogen, UA: 0.2 mg/dL (ref 0.0–1.0)
pH: 6 (ref 5.0–8.0)

## 2014-09-19 LAB — CBC WITH DIFFERENTIAL/PLATELET
Basophils Absolute: 0 10*3/uL (ref 0.0–0.1)
Basophils Relative: 0 % (ref 0–1)
EOS PCT: 4 % (ref 0–5)
Eosinophils Absolute: 0.4 10*3/uL (ref 0.0–0.7)
HCT: 35.7 % — ABNORMAL LOW (ref 39.0–52.0)
HEMOGLOBIN: 11.2 g/dL — AB (ref 13.0–17.0)
Lymphocytes Relative: 21 % (ref 12–46)
Lymphs Abs: 2.1 10*3/uL (ref 0.7–4.0)
MCH: 25.2 pg — ABNORMAL LOW (ref 26.0–34.0)
MCHC: 31.4 g/dL (ref 30.0–36.0)
MCV: 80.4 fL (ref 78.0–100.0)
MONO ABS: 1 10*3/uL (ref 0.1–1.0)
MONOS PCT: 10 % (ref 3–12)
NEUTROS ABS: 6.5 10*3/uL (ref 1.7–7.7)
Neutrophils Relative %: 65 % (ref 43–77)
Platelets: 231 10*3/uL (ref 150–400)
RBC: 4.44 MIL/uL (ref 4.22–5.81)
RDW: 17.6 % — ABNORMAL HIGH (ref 11.5–15.5)
WBC: 10.1 10*3/uL (ref 4.0–10.5)

## 2014-09-19 LAB — URINE MICROSCOPIC-ADD ON

## 2014-09-19 LAB — BASIC METABOLIC PANEL
ANION GAP: 7 (ref 5–15)
BUN: 27 mg/dL — AB (ref 6–23)
CO2: 26 mmol/L (ref 19–32)
CREATININE: 3.48 mg/dL — AB (ref 0.50–1.35)
Calcium: 8.2 mg/dL — ABNORMAL LOW (ref 8.4–10.5)
Chloride: 108 mmol/L (ref 96–112)
GFR calc Af Amer: 21 mL/min — ABNORMAL LOW (ref >90)
GFR calc non Af Amer: 18 mL/min — ABNORMAL LOW (ref >90)
Glucose, Bld: 116 mg/dL — ABNORMAL HIGH (ref 70–99)
Potassium: 3.2 mmol/L — ABNORMAL LOW (ref 3.5–5.1)
Sodium: 141 mmol/L (ref 135–145)

## 2014-09-19 MED ORDER — HYDROCODONE-ACETAMINOPHEN 5-325 MG PO TABS: 1.0000 | ORAL_TABLET | ORAL | Status: DC | PRN

## 2014-09-19 MED ORDER — LEVOFLOXACIN 750 MG PO TABS
750.0000 mg | ORAL_TABLET | ORAL | Status: DC
  Administered 2014-09-19: 750 mg via ORAL
  Filled 2014-09-19: qty 1

## 2014-09-19 MED ORDER — MORPHINE SULFATE 4 MG/ML IJ SOLN
4.0000 mg | Freq: Once | INTRAMUSCULAR | Status: AC
  Administered 2014-09-19: 4 mg via INTRAVENOUS
  Filled 2014-09-19: qty 1

## 2014-09-19 MED ORDER — LEVOFLOXACIN 500 MG PO TABS: 500.0000 mg | ORAL_TABLET | Freq: Every day | ORAL | Status: DC

## 2014-09-19 MED ORDER — ENOXAPARIN SODIUM 150 MG/ML ~~LOC~~ SOLN
125.0000 mg | Freq: Once | SUBCUTANEOUS | Status: AC
  Administered 2014-09-19: 125 mg via SUBCUTANEOUS
  Filled 2014-09-19: qty 2

## 2014-09-19 MED ORDER — SODIUM CHLORIDE 0.9 % IV BOLUS (SEPSIS)
1000.0000 mL | Freq: Once | INTRAVENOUS | Status: AC
  Administered 2014-09-19: 1000 mL via INTRAVENOUS

## 2014-09-19 NOTE — ED Notes (Signed)
Patient transported to Ultrasound 

## 2014-09-19 NOTE — Discharge Instructions (Signed)
Acute Kidney Injury Acute kidney injury is a disease in which there is sudden (acute) damage to the kidneys. The kidneys are 2 organs that lie on either side of the spine between the middle of the back and the front of the abdomen. The kidneys:  Remove wastes and extra water from the blood.   Produce important hormones. These help keep bones strong, regulate blood pressure, and help create red blood cells.   Balance the fluids and chemicals in the blood and tissues. A small amount of kidney damage may not cause problems, but a large amount of damage may make it difficult or impossible for the kidneys to work the way they should. Acute kidney injury may develop into long-lasting (chronic) kidney disease. It may also develop into a life-threatening disease called end-stage kidney disease. Acute kidney injury can get worse very quickly, so it should be treated right away. Early treatment may prevent other kidney diseases from developing.  CAUSES   A problem with blood flow to the kidneys. This may be caused by:   Blood loss.   Heart disease.   Severe burns.   Liver disease.  Direct damage to the kidneys. This may be caused by:  Some medicines.   A kidney infection.   Poisoning or consuming toxic substances.   A surgical wound.   A blow to the kidney area.   A problem with urine flow. This may be caused by:   Cancer.   Kidney stones.   An enlarged prostate. SYMPTOMS   Swelling (edema) of the legs, ankles, or feet.   Tiredness (lethargy).   Nausea or vomiting.   Confusion.   Problems with urination, such as:   Painful or burning feeling during urination.   Decreased urine production.   Frequent accidents in children who are potty trained.   Bloody urine.   Muscle twitches and cramps.   Shortness of breath.   Seizures.   Chest pain or pressure. Sometimes, no symptoms are present. DIAGNOSIS Acute kidney injury may be detected  and diagnosed by tests, including blood, urine, imaging, or kidney biopsy tests.  TREATMENT Treatment of acute kidney injury varies depending on the cause and severity of the kidney damage. In mild cases, no treatment may be needed. The kidneys may heal on their own. If acute kidney injury is more severe, your caregiver will treat the cause of the kidney damage, help the kidneys heal, and prevent complications from occurring. Severe cases may require a procedure to remove toxic wastes from the body (dialysis) or surgery to repair kidney damage. Surgery may involve:   Repair of a torn kidney.   Removal of an obstruction. Most of the time, you will need to stay overnight at the hospital.  HOME CARE INSTRUCTIONS:  Follow your prescribed diet.  Only take over-the-counter or prescription medicines as directed by your caregiver.  Do not take any new medicines (prescription, over-the-counter, or nutritional supplements) unless approved by your caregiver. Many medicines can worsen your kidney damage or need to have the dose adjusted.   Keep all follow-up appointments as directed by your caregiver.  Observe your condition to make sure you are healing as expected. SEEK IMMEDIATE MEDICAL CARE IF:  You are feeling ill or have severe pain in the back or side.   Your symptoms return or you have new symptoms.  You have any symptoms of end-stage kidney disease. These include:   Persistent itchiness.   Loss of appetite.   Headaches.   Abnormally dark  or light skin.  Numbness in the hands or feet.   Easy bruising.   Frequent hiccups.   Menstruation stops.   You have a fever.  You have increased urine production.  You have pain or bleeding when urinating. MAKE SURE YOU:   Understand these instructions.  Will watch your condition.  Will get help right away if you are not doing well or get worse Document Released: 12/19/2010 Document Revised: 09/30/2012 Document  Reviewed: 02/02/2012 [facility] Patient Information 2015 [place], [place]. This information is not intended to replace advice given to you by your health care provider. Make sure you discuss any questions you have with your health care provider.  Epididymitis Epididymitis is a swelling (inflammation) of the epididymis. The epididymis is a cord-like structure along the back part of the testicle. Epididymitis is usually, but not always, caused by infection. This is usually a sudden problem beginning with chills, fever and pain behind the scrotum and in the testicle. There may be swelling and redness of the testicle. DIAGNOSIS  Physical examination will reveal a tender, swollen epididymis. Sometimes, cultures are obtained from the urine or from prostate secretions to help find out if there is an infection or if the cause is a different problem. Sometimes, blood work is performed to see if your white blood cell count is elevated and if a germ (bacterial) or viral infection is present. Using this knowledge, an appropriate medicine which kills germs (antibiotic) can be chosen by your caregiver. A viral infection causing epididymitis will most often go away (resolve) without treatment. HOME CARE INSTRUCTIONS   Hot sitz baths for 20 minutes, 4 times per day, may help relieve pain.  Only take over-the-counter or prescription medicines for pain, discomfort or fever as directed by your caregiver.  Take all medicines, including antibiotics, as directed. Take the antibiotics for the full prescribed length of time even if you are feeling better.  It is very important to keep all follow-up appointments. SEEK IMMEDIATE MEDICAL CARE IF:   You have a fever.  You have pain not relieved with medicines.  You have any worsening of your problems.  Your pain seems to come and go.  You develop pain, redness, and swelling in the scrotum and surrounding areas. MAKE SURE YOU:   Understand these instructions.  Will  watch your condition.  Will get help right away if you are not doing well or get worse. Document Released: 06/02/2000 Document Revised: 08/28/2011 Document Reviewed: 04/22/2009 [facility] Patient Information 2015 [place] , [place]. This information is not intended to replace advice given to you by your health care provider. Make sure you discuss any questions you have with your health care provider.

## 2014-09-20 LAB — URINE CULTURE
COLONY COUNT: NO GROWTH
Culture: NO GROWTH

## 2014-09-21 ENCOUNTER — Emergency Department (HOSPITAL_COMMUNITY): Payer: 59

## 2014-09-21 ENCOUNTER — Inpatient Hospital Stay (HOSPITAL_COMMUNITY)
Admission: EM | Admit: 2014-09-21 | Discharge: 2014-09-24 | DRG: 684 | Disposition: A | Discharge status: Home / Self Care | Payer: 59 | Attending: Internal Medicine | Admitting: Internal Medicine

## 2014-09-21 ENCOUNTER — Observation Stay (HOSPITAL_COMMUNITY): Payer: 59

## 2014-09-21 ENCOUNTER — Encounter (HOSPITAL_COMMUNITY): Payer: Self-pay

## 2014-09-21 DIAGNOSIS — Z9119 Patient's noncompliance with other medical treatment and regimen: Secondary | ICD-10-CM | POA: Diagnosis present

## 2014-09-21 DIAGNOSIS — I129 Hypertensive chronic kidney disease with stage 1 through stage 4 chronic kidney disease, or unspecified chronic kidney disease: Secondary | ICD-10-CM | POA: Diagnosis present

## 2014-09-21 DIAGNOSIS — M7121 Synovial cyst of popliteal space [Baker], right knee: Secondary | ICD-10-CM | POA: Diagnosis not present

## 2014-09-21 DIAGNOSIS — G4733 Obstructive sleep apnea (adult) (pediatric): Secondary | ICD-10-CM | POA: Diagnosis present

## 2014-09-21 DIAGNOSIS — M1990 Unspecified osteoarthritis, unspecified site: Secondary | ICD-10-CM | POA: Diagnosis present

## 2014-09-21 DIAGNOSIS — N451 Epididymitis: Secondary | ICD-10-CM | POA: Diagnosis present

## 2014-09-21 DIAGNOSIS — I10 Essential (primary) hypertension: Secondary | ICD-10-CM | POA: Diagnosis not present

## 2014-09-21 DIAGNOSIS — Z8546 Personal history of malignant neoplasm of prostate: Secondary | ICD-10-CM | POA: Diagnosis not present

## 2014-09-21 DIAGNOSIS — Z79899 Other long term (current) drug therapy: Secondary | ICD-10-CM | POA: Diagnosis not present

## 2014-09-21 DIAGNOSIS — M25469 Effusion, unspecified knee: Secondary | ICD-10-CM | POA: Insufficient documentation

## 2014-09-21 DIAGNOSIS — N400 Enlarged prostate without lower urinary tract symptoms: Secondary | ICD-10-CM | POA: Diagnosis not present

## 2014-09-21 DIAGNOSIS — M25561 Pain in right knee: Secondary | ICD-10-CM

## 2014-09-21 DIAGNOSIS — E876 Hypokalemia: Secondary | ICD-10-CM | POA: Diagnosis present

## 2014-09-21 DIAGNOSIS — C61 Malignant neoplasm of prostate: Secondary | ICD-10-CM

## 2014-09-21 DIAGNOSIS — E785 Hyperlipidemia, unspecified: Secondary | ICD-10-CM | POA: Diagnosis present

## 2014-09-21 DIAGNOSIS — E119 Type 2 diabetes mellitus without complications: Secondary | ICD-10-CM | POA: Diagnosis present

## 2014-09-21 DIAGNOSIS — N183 Chronic kidney disease, stage 3 (moderate): Secondary | ICD-10-CM | POA: Diagnosis not present

## 2014-09-21 DIAGNOSIS — M25461 Effusion, right knee: Secondary | ICD-10-CM

## 2014-09-21 DIAGNOSIS — M109 Gout, unspecified: Secondary | ICD-10-CM | POA: Diagnosis present

## 2014-09-21 DIAGNOSIS — N179 Acute kidney failure, unspecified: Secondary | ICD-10-CM | POA: Diagnosis not present

## 2014-09-21 DIAGNOSIS — M7120 Synovial cyst of popliteal space [Baker], unspecified knee: Secondary | ICD-10-CM | POA: Insufficient documentation

## 2014-09-21 LAB — CBC WITH DIFFERENTIAL/PLATELET
BASOS ABS: 0 10*3/uL (ref 0.0–0.1)
Basophils Relative: 0 % (ref 0–1)
Eosinophils Absolute: 0.1 10*3/uL (ref 0.0–0.7)
Eosinophils Relative: 1 % (ref 0–5)
HCT: 37.1 % — ABNORMAL LOW (ref 39.0–52.0)
Hemoglobin: 11.7 g/dL — ABNORMAL LOW (ref 13.0–17.0)
Lymphocytes Relative: 19 % (ref 12–46)
Lymphs Abs: 2.1 10*3/uL (ref 0.7–4.0)
MCH: 25.1 pg — ABNORMAL LOW (ref 26.0–34.0)
MCHC: 31.5 g/dL (ref 30.0–36.0)
MCV: 79.6 fL (ref 78.0–100.0)
MONO ABS: 1.4 10*3/uL — AB (ref 0.1–1.0)
MONOS PCT: 13 % — AB (ref 3–12)
NEUTROS ABS: 7.3 10*3/uL (ref 1.7–7.7)
Neutrophils Relative %: 67 % (ref 43–77)
Platelets: 244 10*3/uL (ref 150–400)
RBC: 4.66 MIL/uL (ref 4.22–5.81)
RDW: 17.7 % — ABNORMAL HIGH (ref 11.5–15.5)
WBC: 10.9 10*3/uL — AB (ref 4.0–10.5)

## 2014-09-21 LAB — SYNOVIAL CELL COUNT + DIFF, W/ CRYSTALS
Crystals, Fluid: NONE SEEN
EOSINOPHILS-SYNOVIAL: NONE SEEN % (ref 0–1)
Lymphocytes-Synovial Fld: 3 % (ref 0–20)
MONOCYTE-MACROPHAGE-SYNOVIAL FLUID: 23 % — AB (ref 50–90)
Neutrophil, Synovial: 74 % — ABNORMAL HIGH (ref 0–25)
WBC, Synovial: 3700 /mm3 — ABNORMAL HIGH (ref 0–200)

## 2014-09-21 LAB — BASIC METABOLIC PANEL
ANION GAP: 12 (ref 5–15)
BUN: 46 mg/dL — AB (ref 6–23)
CALCIUM: 8.4 mg/dL (ref 8.4–10.5)
CO2: 25 mmol/L (ref 19–32)
CREATININE: 4.71 mg/dL — AB (ref 0.50–1.35)
Chloride: 103 mmol/L (ref 96–112)
GFR, EST AFRICAN AMERICAN: 15 mL/min — AB (ref >90)
GFR, EST NON AFRICAN AMERICAN: 13 mL/min — AB (ref >90)
Glucose, Bld: 86 mg/dL (ref 70–99)
Potassium: 3.2 mmol/L — ABNORMAL LOW (ref 3.5–5.1)
Sodium: 140 mmol/L (ref 135–145)

## 2014-09-21 LAB — URIC ACID: Uric Acid, Serum: 5.9 mg/dL (ref 4.0–7.8)

## 2014-09-21 MED ORDER — OXYCODONE HCL 5 MG PO TABS
5.0000 mg | ORAL_TABLET | ORAL | Status: DC | PRN
  Administered 2014-09-22 – 2014-09-23 (×4): 5 mg via ORAL
  Filled 2014-09-21 (×3): qty 1

## 2014-09-21 MED ORDER — ONDANSETRON HCL 4 MG PO TABS: 4.0000 mg | ORAL_TABLET | Freq: Four times a day (QID) | ORAL | Status: DC | PRN

## 2014-09-21 MED ORDER — ACETAMINOPHEN 650 MG RE SUPP: 650.0000 mg | Freq: Four times a day (QID) | RECTAL | Status: DC | PRN

## 2014-09-21 MED ORDER — HEPARIN SODIUM (PORCINE) 5000 UNIT/ML IJ SOLN
5000.0000 [IU] | Freq: Three times a day (TID) | INTRAMUSCULAR | Status: DC
  Administered 2014-09-22 – 2014-09-23 (×6): 5000 [IU] via SUBCUTANEOUS
  Filled 2014-09-21 (×9): qty 1

## 2014-09-21 MED ORDER — SODIUM CHLORIDE 0.9 % IJ SOLN
3.0000 mL | Freq: Two times a day (BID) | INTRAMUSCULAR | Status: DC
  Administered 2014-09-22 – 2014-09-24 (×3): 3 mL via INTRAVENOUS

## 2014-09-21 MED ORDER — TRIAMCINOLONE ACETONIDE 0.1 % EX CREA
1.0000 "application " | TOPICAL_CREAM | Freq: Two times a day (BID) | CUTANEOUS | Status: DC
  Administered 2014-09-22 – 2014-09-24 (×6): 1 via TOPICAL
  Filled 2014-09-21 (×2): qty 15

## 2014-09-21 MED ORDER — ALUM & MAG HYDROXIDE-SIMETH 200-200-20 MG/5ML PO SUSP: 30.0000 mL | Freq: Four times a day (QID) | ORAL | Status: DC | PRN

## 2014-09-21 MED ORDER — SODIUM CHLORIDE 0.9 % IV SOLN
INTRAVENOUS | Status: DC
  Administered 2014-09-21 – 2014-09-23 (×2): via INTRAVENOUS

## 2014-09-21 MED ORDER — MORPHINE SULFATE 4 MG/ML IJ SOLN
4.0000 mg | Freq: Once | INTRAMUSCULAR | Status: AC
  Administered 2014-09-21: 4 mg via INTRAVENOUS
  Filled 2014-09-21: qty 1

## 2014-09-21 MED ORDER — MORPHINE SULFATE 2 MG/ML IJ SOLN
1.0000 mg | INTRAMUSCULAR | Status: DC | PRN
  Administered 2014-09-21 – 2014-09-24 (×6): 1 mg via INTRAVENOUS
  Filled 2014-09-21 (×5): qty 1

## 2014-09-21 MED ORDER — DOCUSATE SODIUM 100 MG PO CAPS
100.0000 mg | ORAL_CAPSULE | Freq: Two times a day (BID) | ORAL | Status: DC
  Administered 2014-09-22 – 2014-09-24 (×6): 100 mg via ORAL
  Filled 2014-09-21 (×6): qty 1

## 2014-09-21 MED ORDER — ONDANSETRON HCL 4 MG/2ML IJ SOLN: 4.0000 mg | Freq: Four times a day (QID) | INTRAMUSCULAR | Status: DC | PRN

## 2014-09-21 MED ORDER — LIDOCAINE-EPINEPHRINE (PF) 2 %-1:200000 IJ SOLN
10.0000 mL | Freq: Once | INTRAMUSCULAR | Status: AC
  Administered 2014-09-21: 10 mL
  Filled 2014-09-21: qty 20

## 2014-09-21 MED ORDER — AMLODIPINE BESYLATE 10 MG PO TABS
10.0000 mg | ORAL_TABLET | Freq: Every morning | ORAL | Status: DC
  Administered 2014-09-22 – 2014-09-24 (×3): 10 mg via ORAL
  Filled 2014-09-21 (×3): qty 1

## 2014-09-21 MED ORDER — ACETAMINOPHEN 325 MG PO TABS: 650.0000 mg | ORAL_TABLET | Freq: Four times a day (QID) | ORAL | Status: DC | PRN

## 2014-09-21 NOTE — Progress Notes (Signed)
Called ED for report,staff not able to give report at present time.

## 2014-09-21 NOTE — ED Notes (Signed)
Diet heart healthy/carb modified dinner tray ordered

## 2014-09-21 NOTE — Progress Notes (Addendum)
VASCULAR LAB PRELIMINARY  PRELIMINARY  PRELIMINARY  PRELIMINARY  Right lower extremity venous duplex completed.    Preliminary report:  Right:  No evidence of DVT orsuperficial thrombosis. Baker's cyst noted in the right popliteal fossa.  Kamila Broda, RVT 09/21/2014, 3:08 PM

## 2014-09-21 NOTE — H&P (Signed)
[facility] History and Physical                                                                                    [name], is a 54 y.o. male  MRN: [id number]   DOB - 1960-09-18  Admit Date - 09/21/2014  Outpatient Primary MD for the patient is ROBERTS, Sharol Given, MD  With History of -  Past Medical History  Diagnosis Date  . Hyperlipidemia   . Hypertension   . Gout     STABLE  PER PT 10-22-2013  . BPH (benign prostatic hypertrophy)   . Elevated PSA   . Microhematuria   . Borderline diabetes mellitus   . Nocturia   . Arthritis     SPINE  . Wears glasses   . CKD (chronic kidney disease), stage III   . OSA (obstructive sleep apnea)     PER PT STUDY DONE 2005 (APPROX).  NON- COMPLIANT CPAP  . H/O hiatal hernia   . Diabetes mellitus without complication     borderline on no meds       Past Surgical History  Procedure Laterality Date  . Transthoracic echocardiogram  07-03-2006    MILD - MODERATE LVH/  EF 55-60%/  GRADE I DIASTOIC DYSFUNCTION/  TRIVIAL  TR /  TRIVIAL PERICARDIAL EFFUSION  . Right hand surgery  YRS AGO  . Cardiac catheterization  10-04-2006   DR [facility]    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%/  2ndDIAGONAL 30%/  RCA 40%/  EF 60%  . Cardiac catheterization  04-07-2002  DR [facility]    NON-CRITICAL CAD/  PRESERVED LV  . Prostate biopsy N/A 10/27/2013    Procedure: BIOPSY TRANSRECTAL ULTRASONIC PROSTATE (TUBP);  Surgeon: Molli Hazard, MD;  Location: [facility];  Service: Urology;  Laterality: N/A;  . Cystoscopy w/ retrogrades Bilateral 10/27/2013    Procedure: CYSTOSCOPY WITH RETROGRADE PYELOGRAM;  Surgeon: Molli Hazard, MD;  Location: [facility];  Service: Urology;  Laterality: Bilateral;  . Robot assisted laparoscopic radical prostatectomy N/A 12/18/2013    Procedure: ROBOTIC ASSISTED LAPAROSCOPIC RADICAL PROSTATECTOMY AND INDOCYANINE GREEN DYE;  Surgeon: Alexis Frock, MD;  Location: [facility];  Service:  Urology;  Laterality: N/A;  . Lymphadenectomy Bilateral 12/18/2013    Procedure: PELVIC LYMPH NODE DISSECTION;  Surgeon: Alexis Frock, MD;  Location: [facility];  Service: Urology;  Laterality: Bilateral;    in for   Chief Complaint  Patient presents with  . Leg Swelling     HPI 54 year old male patient with known hypertension, dyslipidemia, gout, sleep apnea noncompliant with CPAP, borderline diabetes and chronic kidney disease stage III who has not yet been referred to nephrology. He also has a history of prostate cancer and is status post TURP. The patient was initially evaluated in the ER on 4/2 after complaints of right leg pain and right testicular pain. He was diagnosed with epididymitis and started on Levaquin. Because of swelling in the knee and the leg there were concerns the patient may have gout and possible DVTs and was started on colchicine and was to return on 4/3 to have venous duplex of the right lower extremity. He  was unable to return on 4/3 but return to the ER today complaining of 10 out of 10 pain in his right knee posterior thigh and calf. He reports the testicular pain has resolved. He reports taking the Levaquin and other medication as prescribed. No other symptoms reported. Venous duplex was eventually completed that basically showed a Baker cyst but no DVT. 4 view x-ray of right knee demonstrated moderate joint effusion. The knee was tapped and cytology revealed cloudy appearing yellow fluid without any crystals with minimally elevated WBC and neutrophils consistent with an inflammatory pattern. Unfortunately patient now had acute renal failure with a BUN of 46 and a creatinine of 4.71. On 4/to his BUN was 27 and creatinine was 3.48. Baseline renal function BUN 17 and creatinine 2.16. I have reviewed the patient's preadmission medications as they're listed in the medication reconciliation and it appears he was only taking Norvasc, Zyloprim, Levaquin and Vicodin. Patient told me he  was taking colchicine for the past 2 days and was taking hydrochlorothiazide but this needs to be confirmed. Currently patient is only complaining of right knee pain but states it is much better than it was when he presented. Again he reports that the right testicular pain has resolved. He is not having any trouble breathing, no chest pain or palpitations or other symptoms.  Review of Systems   In addition to the HPI above,  No Fever-chills, myalgias or other constitutional symptoms No Headache, changes with Vision or hearing, new weakness, tingling, numbness in any extremity, No problems swallowing food or Liquids, indigestion/reflux No Chest pain, Cough or Shortness of Breath, palpitations, orthopnea or DOE No Abdominal pain, N/V; no melena or hematochezia, no dark tarry stools, Bowel movements are regular, No dysuria, hematuria or flank pain No new skin rashes, lesions, masses or bruises, No recent weight gain or loss No polyuria, polydypsia or polyphagia,  *A full 10 point Review of Systems was done, except as stated above, all other Review of Systems were negative.  Social History History  Substance Use Topics  . Smoking status: Never Smoker   . Smokeless tobacco: Never Used  . Alcohol Use: No    Family History Family History  Problem Relation Age of Onset  . Cirrhosis Father   . Other Mother     shot and killed    Prior to Admission medications   Medication Sig Start Date End Date Taking? Authorizing Provider  allopurinol (ZYLOPRIM) 300 MG tablet Take 1 tablet (300 mg total) by mouth daily. 12/21/13  Yes Festus Aloe, MD  amLODipine (NORVASC) 10 MG tablet Take 10 mg by mouth every morning.    Yes Historical Provider, MD  HYDROcodone-acetaminophen (NORCO/VICODIN) 5-325 MG per tablet Take 1 tablet by mouth every 4 (four) hours as needed. 09/19/14  Yes Kristen N Ward, DO  levofloxacin (LEVAQUIN) 500 MG tablet Take 1 tablet (500 mg total) by mouth daily. 09/19/14  Yes Kristen N  Ward, DO  triamcinolone cream (KENALOG) 0.1 % Apply 1 application topically 2 (two) times daily. 08/11/14  Yes Historical Provider, MD    Allergies  Allergen Reactions  . Penicillins Nausea And Vomiting and Rash    Physical Exam  Vitals  Blood pressure 163/104, pulse 88, temperature 97.9 F (36.6 C), temperature source Oral, resp. rate 18, height 5\' 11"  (1.803 m), weight 278 lb (126.1 kg), SpO2 100 %.   General:  In no acute distress, appears healthy and well nourished  Psych:  Normal affect, Denies Suicidal or Homicidal ideations, Awake  Alert, Oriented X 3. Speech and thought patterns are clear and appropriate, no apparent short term memory deficits  Neuro:   No focal neurological deficits, CN II through XII intact, Strength 5/5 all 4 extremities, Sensation intact all 4 extremities.  ENT:  Ears and Eyes appear Normal, Conjunctivae clear, PER. Moist oral mucosa without erythema or exudates.  Neck:  Supple, No lymphadenopathy appreciated  Respiratory:  Symmetrical chest wall movement, Good air movement bilaterally, CTAB. Room Air  Cardiac:  RRR, No Murmurs, no LE edema noted, no JVD, No carotid bruits, peripheral pulses palpable at 2+  Abdomen:  Positive bowel sounds, Soft, Non tender, Non distended,  No masses appreciated, no obvious hepatosplenomegaly  Skin:  No Cyanosis, Normal Skin Turgor, No Skin Rash or Bruise.  Extremities: Symmetrical without obvious trauma or injury, small residual effusion right knee post aspiration, patient was self limited range of motion right knee secondary to discomfort, no erythema noted in right knee  Data Review  CBC  Recent Labs Lab 09/19/14 0250 09/21/14 1335  WBC 10.1 10.9*  HGB 11.2* 11.7*  HCT 35.7* 37.1*  PLT 231 244  MCV 80.4 79.6  MCH 25.2* 25.1*  MCHC 31.4 31.5  RDW 17.6* 17.7*  LYMPHSABS 2.1 2.1  MONOABS 1.0 1.4*  EOSABS 0.4 0.1  BASOSABS 0.0 0.0    Chemistries   Recent Labs Lab 09/19/14 0249 09/21/14 1335  NA  141 140  K 3.2* 3.2*  CL 108 103  CO2 26 25  GLUCOSE 116* 86  BUN 27* 46*  CREATININE 3.48* 4.71*  CALCIUM 8.2* 8.4    estimated creatinine clearance is 24 mL/min (by C-G formula based on Cr of 4.71).  No results for input(s): TSH, T4TOTAL, T3FREE, THYROIDAB in the last 72 hours.  Invalid input(s): FREET3  Coagulation profile No results for input(s): INR, PROTIME in the last 168 hours.  No results for input(s): DDIMER in the last 72 hours.  Cardiac Enzymes No results for input(s): CKMB, TROPONINI, MYOGLOBIN in the last 168 hours.  Invalid input(s): CK  Invalid input(s): POCBNP  Urinalysis    Component Value Date/Time   COLORURINE YELLOW 09/19/2014 0015   APPEARANCEUR CLEAR 09/19/2014 0015   LABSPEC 1.020 09/19/2014 0015   PHURINE 6.0 09/19/2014 0015   GLUCOSEU NEGATIVE 09/19/2014 0015   HGBUR MODERATE* 09/19/2014 0015   BILIRUBINUR NEGATIVE 09/19/2014 0015   KETONESUR NEGATIVE 09/19/2014 0015   PROTEINUR 100* 09/19/2014 0015   UROBILINOGEN 0.2 09/19/2014 0015   NITRITE NEGATIVE 09/19/2014 0015   LEUKOCYTESUR NEGATIVE 09/19/2014 0015    Imaging results:   US Scrotum  09/19/2014   CLINICAL DATA:  Acute onset of right inferior testicular pain for 10 hours. Initial encounter.  EXAM: ULTRASOUND OF SCROTUM  TECHNIQUE: Complete ultrasound examination of the testicles, epididymis, and other scrotal structures was performed.  COMPARISON:  Scrotal ultrasound performed 04/06/2006  FINDINGS: Right testicle  Measurements: 4.3 x 2.4 x 2.6 cm. No mass or microlithiasis visualized.  Left testicle  Measurements: 4.0 x 2.1 x 2.5 cm. No mass or microlithiasis visualized.  Right epididymis: There is diffuse enlargement of the tail of the right epididymis, with hyperemia and associated pain. This is concerning for acute epididymitis.  Left epididymis:  Normal in size and appearance.  Hydrocele:  A small to moderate right-sided hydrocele is noted.  Varicocele: A left-sided varicocele is  noted, with augmentation on Valsalva maneuver.  IMPRESSION: 1. No evidence of testicular torsion. 2. Diffuse enlargement of the tail of the right epididymis, with hyperemia  and associated pain, concerning for acute right-sided epididymitis. 3. Small to moderate associated right-sided hydrocele seen. 4. Left-sided varicocele noted.   Electronically Signed   By: Garald Balding M.D.   On: 09/19/2014 01:36   [place] Art/ven Flow Abd Pelv Doppler  09/19/2014   CLINICAL DATA:  Acute onset of right inferior testicular pain for 10 hours. Initial encounter.  EXAM: ULTRASOUND OF SCROTUM  TECHNIQUE: Complete ultrasound examination of the testicles, epididymis, and other scrotal structures was performed.  COMPARISON:  Scrotal ultrasound performed 04/06/2006  FINDINGS: Right testicle  Measurements: 4.3 x 2.4 x 2.6 cm. No mass or microlithiasis visualized.  Left testicle  Measurements: 4.0 x 2.1 x 2.5 cm. No mass or microlithiasis visualized.  Right epididymis: There is diffuse enlargement of the tail of the right epididymis, with hyperemia and associated pain. This is concerning for acute epididymitis.  Left epididymis:  Normal in size and appearance.  Hydrocele:  A small to moderate right-sided hydrocele is noted.  Varicocele: A left-sided varicocele is noted, with augmentation on Valsalva maneuver.  IMPRESSION: 1. No evidence of testicular torsion. 2. Diffuse enlargement of the tail of the right epididymis, with hyperemia and associated pain, concerning for acute right-sided epididymitis. 3. Small to moderate associated right-sided hydrocele seen. 4. Left-sided varicocele noted.   Electronically Signed   By: Garald Balding M.D.   On: 09/19/2014 01:36   Dg Knee Complete 4 Views Right  09/21/2014   CLINICAL DATA:  Right knee pain medially for the past 4 days, no known injury, initial encounter.  EXAM: RIGHT KNEE - COMPLETE 4+ VIEW  COMPARISON:  07/01/2008.  FINDINGS: Moderate joint effusion. Trace patellofemoral osteophytosis. No  acute osseous abnormality.  IMPRESSION: Moderate joint effusion.   Electronically Signed   By: Lorin Picket M.D.   On: 09/21/2014 14:48     EKG: Tachycardia without any acute ST-T wave changes   Assessment & Plan  Principal Problem:   AKI on stage III chronic kidney disease -Admit to telemetry -Gentle IV fluid hydration -Hold nephrotoxic medications; patient reports was taking cultures seen but this is not on his medication reconciliation report only allopurinol -Renal ultrasound -Recommend obtaining nephrology consultation in a.m. -Follow labs  Active Problems:   Essential hypertension, benign -Current blood pressure controlled -Continue home medications as listed -Confirm that patient not taking other medications besides Norvasc -Has never had echocardiogram and given progression of renal failure will obtained to see if contributing to renal failure    Effusion of right knee -Status post arthrocentesis -Fluid not consistent with gout or infection -Continue supportive care with pain medications -Physical therapy evaluation    Epididymitis -Continue Levaquin-initial dose given 4/2 -Recommend prolonged course of at least 10 days    BPH/Prostate cancer/status post TURP -Currently no issues reported by patient    Hypokalemia -Oral replete and follow labs    Borderline diabetes mellitus -Glucose well controlled -Check hemoglobin A1c    DVT Prophylaxis: Subcutaneous heparin  Family Communication:   No family at bedside  Code Status: Full code   Condition: Stable    Time spent in minutes : 60   Kollins Fenter L. ANP on 09/21/2014 at 6:09 PM  Between 7am to 7pm - Pager - [phone]  After 7pm go to [email or web address] - password [email or web address]  And look for the night coverage person covering me after hours  [facility]

## 2014-09-21 NOTE — ED Notes (Signed)
Pt was seen on the second for leg pain and swelling to right leg. Back for continued pain. Pt is into his groin area also.

## 2014-09-21 NOTE — ED Provider Notes (Signed)
CSN: [id number]     Arrival date & time 09/21/14  1006 History   First MD Initiated Contact with Patient 09/21/14 1149     Chief Complaint  Patient presents with  . Leg Swelling   DREY GOODMAN is a 54 y.o. male with a history of diabetes, prostate cancer, chronic kidney disease, hyperlipidemia, and hypertension who presents to the emergency department complaining of pain to his right thigh, knee and right calf ongoing for 3-4 days. The patient was recently seen in the emergency department on 09/19/2014 and was diagnosed with right epididymitis and placed on Levaquin. He was also due to return the next morning to have Doppler ultrasound done of his right lower extremity. He was unable to return for this test and is back today due to increased pain. He is complaining of 10 out of 10 pain to his right knee, posterior thigh and calf. He reports his pain is worse with bending of his knee. The patient reports taking Levaquin as prescribed and has taken hydrocodone this morning without relief of this pain. He reports he still has some pain that radiates into his testicles. Patient denies fevers, chills, chest pain, shortness of breath, palpitations, urinary symptoms, hematuria, smoking, cough, abdominal pain, nausea, vomiting, constipation, diarrhea, penile discharge, scrotal swelling, no recent long travel. Patient does have history of prostate cancer. The patient denies personal or family history of DVTs or PEs. The patient denies personal or family history of blood clotting disorders such as factor V Leiden, protein C or S deficiency.  (Consider location/radiation/quality/duration/timing/severity/associated sxs/prior Treatment) HPI  Past Medical History  Diagnosis Date  . Hyperlipidemia   . Hypertension   . Gout     STABLE  PER PT 10-22-2013  . BPH (benign prostatic hypertrophy)   . Elevated PSA   . Microhematuria   . Borderline diabetes mellitus   . Nocturia   . Arthritis     SPINE  . Wears  glasses   . CKD (chronic kidney disease), stage III   . OSA (obstructive sleep apnea)     PER PT STUDY DONE 2005 (APPROX).  NON- COMPLIANT CPAP  . H/O hiatal hernia   . Diabetes mellitus without complication     borderline on no meds    Past Surgical History  Procedure Laterality Date  . Transthoracic echocardiogram  07-03-2006    MILD - MODERATE LVH/  EF 55-60%/  GRADE I DIASTOIC DYSFUNCTION/  TRIVIAL  TR /  TRIVIAL PERICARDIAL EFFUSION  . Right hand surgery  YRS AGO  . Cardiac catheterization  10-04-2006   DR [facility]    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%/  2ndDIAGONAL 30%/  RCA 40%/  EF 60%  . Cardiac catheterization  04-07-2002  DR [facility]    NON-CRITICAL CAD/  PRESERVED LV  . Prostate biopsy N/A 10/27/2013    Procedure: BIOPSY TRANSRECTAL ULTRASONIC PROSTATE (TUBP);  Surgeon: Molli Hazard, MD;  Location: [facility];  Service: Urology;  Laterality: N/A;  . Cystoscopy w/ retrogrades Bilateral 10/27/2013    Procedure: CYSTOSCOPY WITH RETROGRADE PYELOGRAM;  Surgeon: Molli Hazard, MD;  Location: [facility];  Service: Urology;  Laterality: Bilateral;  . Robot assisted laparoscopic radical prostatectomy N/A 12/18/2013    Procedure: ROBOTIC ASSISTED LAPAROSCOPIC RADICAL PROSTATECTOMY AND INDOCYANINE GREEN DYE;  Surgeon: Alexis Frock, MD;  Location: [facility];  Service: Urology;  Laterality: N/A;  . Lymphadenectomy Bilateral 12/18/2013    Procedure: PELVIC LYMPH NODE DISSECTION;  Surgeon: Alexis Frock, MD;  Location: [facility];  Service: Urology;  Laterality: Bilateral;   Family History  Problem Relation Age of Onset  . Cirrhosis Father   . Other Mother     shot and killed   History  Substance Use Topics  . Smoking status: Never Smoker   . Smokeless tobacco: Never Used  . Alcohol Use: No    Review of Systems  Constitutional: Negative for fever and chills.  HENT: Negative for congestion, ear pain and sore throat.   Eyes: Negative for visual  disturbance.  Respiratory: Negative for cough, shortness of breath and wheezing.   Cardiovascular: Negative for chest pain and palpitations.  Gastrointestinal: Negative for nausea, vomiting, abdominal pain, diarrhea and blood in stool.  Genitourinary: Positive for testicular pain. Negative for dysuria, urgency, frequency, hematuria, decreased urine volume, discharge, penile swelling, scrotal swelling, difficulty urinating, genital sores and penile pain.  Musculoskeletal: Positive for joint swelling. Negative for back pain and neck pain.       Right leg pain  Skin: Negative for rash.  Neurological: Negative for light-headedness, numbness and headaches.      Allergies  Penicillins  Home Medications   Prior to Admission medications   Medication Sig Start Date End Date Taking? Authorizing Provider  allopurinol (ZYLOPRIM) 300 MG tablet Take 1 tablet (300 mg total) by mouth daily. 12/21/13  Yes Festus Aloe, MD  amLODipine (NORVASC) 10 MG tablet Take 10 mg by mouth every morning.    Yes Historical Provider, MD  HYDROcodone-acetaminophen (NORCO/VICODIN) 5-325 MG per tablet Take 1 tablet by mouth every 4 (four) hours as needed. 09/19/14  Yes Kristen N Ward, DO  levofloxacin (LEVAQUIN) 500 MG tablet Take 1 tablet (500 mg total) by mouth daily. 09/19/14  Yes Kristen N Ward, DO  triamcinolone cream (KENALOG) 0.1 % Apply 1 application topically 2 (two) times daily. 08/11/14  Yes Historical Provider, MD   BP 162/101 mmHg  Pulse 94  Temp(Src) 97.9 F (36.6 C) (Oral)  Resp 18  Ht 5\' 11"  (1.803 m)  Wt 278 lb (126.1 kg)  BMI 38.79 kg/m2  SpO2 99% Physical Exam  Constitutional: He is oriented to person, place, and time. He appears well-developed and well-nourished. No distress.  HENT:  Head: Normocephalic and atraumatic.  Right Ear: External ear normal.  Left Ear: External ear normal.  Mouth/Throat: Oropharynx is clear and moist. No oropharyngeal exudate.  Eyes: Conjunctivae are normal. Pupils  are equal, round, and reactive to light. Right eye exhibits no discharge. Left eye exhibits no discharge.  Neck: Neck supple. No JVD present.  Cardiovascular: Normal rate, regular rhythm, normal heart sounds and intact distal pulses.  Exam reveals no gallop and no friction rub.   No murmur heard. Bilateral radial, posterior tibialis and dorsalis pedis pulses are intact.   Pulmonary/Chest: Effort normal and breath sounds normal. No respiratory distress. He has no wheezes. He has no rales.  Abdominal: Soft. Bowel sounds are normal. He exhibits no distension. There is no tenderness.  Genitourinary: Penis normal. Cremasteric reflex is present. Right testis shows tenderness. Right testis shows no mass and no swelling. Left testis shows no mass, no swelling and no tenderness. Circumcised. No phimosis, paraphimosis, penile erythema or penile tenderness. No discharge found.  Mild bilateral testicle tenderness. No penile discharge or rashes. No scrotal swelling. No testicle masses.   Musculoskeletal: He exhibits edema and tenderness.  Edema noted to his right knee, and calf. Tenderness to his posterior knee, and calf.   Lymphadenopathy:    He has no cervical adenopathy.  Neurological: He is alert and oriented to person, place, and time. Coordination normal.  Sensation intact in his bilateral upper and lower extremities.  Skin: Skin is warm and dry. No rash noted. He is not diaphoretic. No erythema. No pallor.  Psychiatric: He has a normal mood and affect. His behavior is normal.  Nursing note and vitals reviewed.   ED Course  ARTHOCENTESIS Date/Time: 09/21/2014 4:00 PM Performed by: Waynetta Pean Authorized by: Waynetta Pean Consent: Verbal consent obtained. Risks and benefits: risks, benefits and alternatives were discussed Consent given by: patient Patient understanding: patient states understanding of the procedure being performed Patient consent: the patient's understanding of the procedure  matches consent given Procedure consent: procedure consent matches procedure scheduled Relevant documents: relevant documents present and verified Site marked: the operative site was marked Imaging studies: imaging studies available Required items: required blood products, implants, devices, and special equipment available Patient identity confirmed: verbally with patient Time out: Immediately prior to procedure a "time out" was called to verify the correct patient, procedure, equipment, support staff and site/side marked as required. Indications: joint swelling,  diagnostic evaluation and pain  Body area: knee Joint: right knee Local anesthesia used: yes Anesthesia: local infiltration Local anesthetic: lidocaine 2% with epinephrine Anesthetic total: 2 ml Patient sedated: no Preparation: Patient was prepped and draped in the usual sterile fashion. Needle gauge: 18 G Ultrasound guidance: no Approach: medial Aspirate: yellow Aspirate amount: 20 mL Patient tolerance: Patient tolerated the procedure well with no immediate complications   (including critical care time) Labs Review Labs Reviewed  BASIC METABOLIC PANEL - Abnormal; Notable for the following:    Potassium 3.2 (*)    BUN 46 (*)    Creatinine, Ser 4.71 (*)    GFR calc non Af Amer 13 (*)    GFR calc Af Amer 15 (*)    All other components within normal limits  CBC WITH DIFFERENTIAL/PLATELET - Abnormal; Notable for the following:    WBC 10.9 (*)    Hemoglobin 11.7 (*)    HCT 37.1 (*)    MCH 25.1 (*)    RDW 17.7 (*)    Monocytes Relative 13 (*)    Monocytes Absolute 1.4 (*)    All other components within normal limits  SYNOVIAL CELL COUNT + DIFF, W/ CRYSTALS - Abnormal; Notable for the following:    Appearance-Synovial CLOUDY (*)    WBC, Synovial 3700 (*)    All other components within normal limits  BODY FLUID CULTURE  URIC ACID  GLUCOSE, SYNOVIAL FLUID    Imaging Review Dg Knee Complete 4 Views  Right  09/21/2014   CLINICAL DATA:  Right knee pain medially for the past 4 days, no known injury, initial encounter.  EXAM: RIGHT KNEE - COMPLETE 4+ VIEW  COMPARISON:  07/01/2008.  FINDINGS: Moderate joint effusion. Trace patellofemoral osteophytosis. No acute osseous abnormality.  IMPRESSION: Moderate joint effusion.   Electronically Signed   By: Lorin Picket M.D.   On: 09/21/2014 14:48     EKG Interpretation None      Filed Vitals:   09/21/14 1631 09/21/14 1645 09/21/14 1700 09/21/14 1715  BP: 143/93 141/96 141/89 162/101  Pulse: 84 75 78 94  Temp:      TempSrc:      Resp:      Height:      Weight:      SpO2: 97% 96% 93% 99%     MDM   Meds given in ED:  Medications  morphine 4  MG/ML injection 4 mg (4 mg Intravenous Given 09/21/14 1342)  lidocaine-EPINEPHrine (XYLOCAINE W/EPI) 2 %-1:200000 (PF) injection 10 mL (10 mLs Infiltration Given by Other 09/21/14 1600)    New Prescriptions   No medications on file    Final diagnoses:  Right knee pain  AKI (acute kidney injury)  Joint effusion, knee, right  Baker's cyst of knee, right   This is a 54 y.o. male with a history of diabetes, prostate cancer, chronic kidney disease, hyperlipidemia, and hypertension who presents to the emergency department complaining of pain to his right thigh, knee and right calf ongoing for 3-4 days. The patient was recently seen in the emergency department on 09/19/2014 and was diagnosed with right epididymitis and placed on Levaquin. He was also due to return the next morning to have Doppler ultrasound done of his right lower extremity. He was unable to return for this test and is back today due to increased pain. He is complaining of 10 out of 10 pain to his right knee, posterior thigh and calf.  The patient is afebrile and nontoxic appearing. He has a right knee joint effusion and pain to his posterior knee and posterior calf. Doppler ultrasound is negative for DVT but shows Baker's cyst in his right  posterior knee. Right knee x-ray shows joint effusion. BMP shows a potassium of 3.2 and a creatinine of 4.71. This is a significant increase in his creatinine since his visit 2 days ago. Plan is for admission for acute kidney injury.  He denies having seen a nephrologist.  Right knee arthrocentesis preformed by me with supervision by Dr. Vernell Barrier. The patient tolerated the procedure well. 20 ml of yellow fluid was obtained and sent to the lab.  Spoke with nurse practioner Ebony Hail from [facility] who accepts this patient for admission for acute kidney injury. The patient is in agreement with admission.  This patient was discussed with and evaluated by Dr. Ashok Cordia who agrees with assessment and plan.    Waynetta Pean, PA-C 09/21/14 [place], MD 09/21/14 [phone]

## 2014-09-22 LAB — CBC
HCT: 35.1 % — ABNORMAL LOW (ref 39.0–52.0)
Hemoglobin: 10.9 g/dL — ABNORMAL LOW (ref 13.0–17.0)
MCH: 24.9 pg — ABNORMAL LOW (ref 26.0–34.0)
MCHC: 31.1 g/dL (ref 30.0–36.0)
MCV: 80.1 fL (ref 78.0–100.0)
Platelets: 237 10*3/uL (ref 150–400)
RBC: 4.38 MIL/uL (ref 4.22–5.81)
RDW: 17.7 % — AB (ref 11.5–15.5)
WBC: 8 10*3/uL (ref 4.0–10.5)

## 2014-09-22 LAB — URINALYSIS, ROUTINE W REFLEX MICROSCOPIC
Bilirubin Urine: NEGATIVE
Glucose, UA: NEGATIVE mg/dL
KETONES UR: NEGATIVE mg/dL
Leukocytes, UA: NEGATIVE
Nitrite: NEGATIVE
Protein, ur: >300 mg/dL — AB
Specific Gravity, Urine: 1.014 (ref 1.005–1.030)
UROBILINOGEN UA: 0.2 mg/dL (ref 0.0–1.0)
pH: 5 (ref 5.0–8.0)

## 2014-09-22 LAB — URINE MICROSCOPIC-ADD ON

## 2014-09-22 LAB — GLUCOSE, SYNOVIAL FLUID: Glucose, Synovial Fluid: 85 mg/dL

## 2014-09-22 LAB — BASIC METABOLIC PANEL
ANION GAP: 8 (ref 5–15)
BUN: 40 mg/dL — ABNORMAL HIGH (ref 6–23)
CALCIUM: 8.4 mg/dL (ref 8.4–10.5)
CO2: 26 mmol/L (ref 19–32)
CREATININE: 4.18 mg/dL — AB (ref 0.50–1.35)
Chloride: 104 mmol/L (ref 96–112)
GFR calc non Af Amer: 15 mL/min — ABNORMAL LOW (ref >90)
GFR, EST AFRICAN AMERICAN: 17 mL/min — AB (ref >90)
Glucose, Bld: 95 mg/dL (ref 70–99)
Potassium: 3.3 mmol/L — ABNORMAL LOW (ref 3.5–5.1)
SODIUM: 138 mmol/L (ref 135–145)

## 2014-09-22 MED ORDER — METOPROLOL TARTRATE 25 MG PO TABS
25.0000 mg | ORAL_TABLET | Freq: Two times a day (BID) | ORAL | Status: DC
  Administered 2014-09-22 – 2014-09-24 (×5): 25 mg via ORAL
  Filled 2014-09-22 (×6): qty 1

## 2014-09-22 MED ORDER — DICLOFENAC SODIUM 1 % TD GEL
2.0000 g | Freq: Four times a day (QID) | TRANSDERMAL | Status: DC
  Administered 2014-09-22 – 2014-09-24 (×8): 2 g via TOPICAL
  Filled 2014-09-22: qty 100

## 2014-09-22 MED ORDER — POTASSIUM CHLORIDE CRYS ER 20 MEQ PO TBCR
40.0000 meq | EXTENDED_RELEASE_TABLET | Freq: Once | ORAL | Status: AC
  Administered 2014-09-22: 40 meq via ORAL
  Filled 2014-09-22: qty 2

## 2014-09-22 NOTE — Progress Notes (Signed)
[facility] PROGRESS NOTE  [name] [id number] DOB: Dec 28, 1960 DOA: 09/21/2014 PCP: Myriam Jacobson, MD  Assessment/Plan:  Principal Problem:   AKI (acute kidney injury):  In the setting of CKD 3, and recent colchicine. Renal ultrasound shows no hydronephrosis, just medical renal disease. Patient reports that while on colchicine he had multiple loose stools. Likely secondary to prerenal state. Denies NSAID or aspirin products. Improving with IV fluids. No need for inpatient nephrology consult. Reasonable to refer him as an outpatient. Continue saline. Patient is NOT on thiazide at home. Active Problems:   Effusion of right knee:  No crystals seen. Synovial fluid White count 3700. Still very tender warm and swollen.  Patient had been on levofloxacin for several days prior to tap. Could this have lowered synovial fluid white count? Unable to give NSAIDs in this patient with acute on chronic kidney disease. Will ask orthopedics to see. ?joint injection? has worked with physical therapy.    Hypokalemia:  Still low. This is a chronic problem for patient. Will replete.    Essential hypertension, benign:  Blood pressure high. Will adjust medications.   BPH (benign prostatic hypertrophy)   CKD (chronic kidney disease) stage 3, GFR 30-59 ml/min   Prostate cancer   Epididymitis  Code Status:  full Family Communication:   at bedside  Disposition Plan:  home  Consultants:    Procedures:     Antibiotics:  arthrocentesis right knee   HPI/Subjective:  pain still severe. No dyspnea. No testicular pain. Good urine output.   Objective: Filed Vitals:   09/22/14 0946  BP: 157/95  Pulse: 88  Temp:   Resp: 17    Intake/Output Summary (Last 24 hours) at 09/22/14 1327 Last data filed at 09/22/14 1047  Gross per 24 hour  Intake 1353.75 ml  Output   2000 ml  Net -646.25 ml   Filed Weights   09/21/14 1037 09/21/14 2234  Weight: 126.1 kg (278 lb) 125.8 kg (277 lb 5.4  oz)    Exam:   General:   somewhat uncomfortable appearing. Alert and oriented.   Cardiovascular:  regular rate rhythm without murmurs gallops rubs   Respiratory:  clear to auscultation bilaterally without wheezes rhonchi or rales   Abdomen: Obese. Soft nontender nondistended   Ext:  right knee warm tender with large effusion. Decreased range of motion due to pain.   Basic Metabolic Panel:  Recent Labs Lab 09/19/14 0249 09/21/14 1335 09/22/14 0550  NA 141 140 138  K 3.2* 3.2* 3.3*  CL 108 103 104  CO2 26 25 26   GLUCOSE 116* 86 95  BUN 27* 46* 40*  CREATININE 3.48* 4.71* 4.18*  CALCIUM 8.2* 8.4 8.4   Liver Function Tests: No results for input(s): AST, ALT, ALKPHOS, BILITOT, PROT, ALBUMIN in the last 168 hours. No results for input(s): LIPASE, AMYLASE in the last 168 hours. No results for input(s): AMMONIA in the last 168 hours. CBC:  Recent Labs Lab 09/19/14 0250 09/21/14 1335 09/22/14 0550  WBC 10.1 10.9* 8.0  NEUTROABS 6.5 7.3  --   HGB 11.2* 11.7* 10.9*  HCT 35.7* 37.1* 35.1*  MCV 80.4 79.6 80.1  PLT 231 244 237   Cardiac Enzymes: No results for input(s): CKTOTAL, CKMB, CKMBINDEX, TROPONINI in the last 168 hours. BNP (last 3 results) No results for input(s): BNP in the last 8760 hours.  ProBNP (last 3 results) No results for input(s): PROBNP in the last 8760 hours.  CBG: No results for input(s): GLUCAP in the  last 168 hours.  Recent Results (from the past 240 hour(s))  Urine culture     Status: None   Collection Time: 09/19/14 12:15 AM  Result Value Ref Range Status   Specimen Description URINE, CLEAN CATCH  Final   Special Requests NONE  Final   Colony Count NO GROWTH Performed at [facility]   Final   Culture NO GROWTH Performed at [facility]   Final   Report Status 09/20/2014 FINAL  Final  Body fluid culture     Status: None (Preliminary result)   Collection Time: 09/21/14  3:56 PM  Result Value Ref Range Status    Specimen Description FLUID RIGHT KNEE  Final   Special Requests Normal  Final   Gram Stain   Final    RARE WBC PRESENT, PREDOMINANTLY PMN NO ORGANISMS SEEN Performed at [facility]    Culture NO GROWTH Performed at [facility]   Final   Report Status PENDING  Incomplete     Studies: US Renal  09/21/2014   CLINICAL DATA:  Acute renal failure  EXAM: RENAL/URINARY TRACT ULTRASOUND COMPLETE  COMPARISON:  CT scan 04/25/2013  FINDINGS: Right Kidney:  Length: 10.6 cm. Normal renal cortical thickness for age. Diffuse increased echogenicity suggesting medical renal disease. No hydronephrosis or renal mass. No definite renal calculi.  Left Kidney:  Length: 10.5 cm. Normal renal cortical thickness. Diffuse increased echogenicity suggesting medical renal disease. No hydronephrosis or renal mass. No definite renal calculi.  Bladder:  Appears normal for degree of bladder distention.  IMPRESSION: Echogenic kidneys suggesting medical renal disease. No hydronephrosis.   Electronically Signed   By: Marijo Sanes M.D.   On: 09/21/2014 18:26   Dg Knee Complete 4 Views Right  09/21/2014   CLINICAL DATA:  Right knee pain medially for the past 4 days, no known injury, initial encounter.  EXAM: RIGHT KNEE - COMPLETE 4+ VIEW  COMPARISON:  07/01/2008.  FINDINGS: Moderate joint effusion. Trace patellofemoral osteophytosis. No acute osseous abnormality.  IMPRESSION: Moderate joint effusion.   Electronically Signed   By: Lorin Picket M.D.   On: 09/21/2014 14:48    Scheduled Meds: . amLODipine  10 mg Oral q morning - 10a  . diclofenac sodium  2 g Topical QID  . docusate sodium  100 mg Oral BID  . heparin  5,000 Units Subcutaneous 3 times per day  . sodium chloride  3 mL Intravenous Q12H  . triamcinolone cream  1 application Topical BID   Continuous Infusions: . sodium chloride 75 mL/hr at 09/21/14 1838    Time spent: 35 minutes  [facility] [email or web address], password  [facility] 09/22/2014, 1:27 PM  LOS: 1 day

## 2014-09-22 NOTE — Evaluation (Signed)
Physical Therapy Evaluation Patient Details Name: [name] MRN: [id number] DOB: 30-Nov-1960 Today's Date: 09/22/2014   History of Present Illness  54 yo with CKD stage III was recently diagnosed with epididymitis and gout in the right knee discharged on colchicine and levofloxacin; came back with worsening pain R knee  Clinical Impression  Pt admitted with above diagnosis. Pt currently with functional limitations due to the deficits listed below (see PT Problem List).  Pt will benefit from skilled PT to increase their independence and safety with mobility to allow discharge to the venue listed below.       Follow Up Recommendations No PT follow up;Outpatient PT  Depending on pt progress/status at discharge    Equipment Recommendations  Rolling walker with 5" wheels;3in1 (PT) (will be discerning RW versus crutches next few sessions)    Recommendations for Other Services       Precautions / Restrictions Precautions Precautions: Fall Precaution Comments: Fall risk greatly reduced with  Restrictions Weight Bearing Restrictions: No      Mobility  Bed Mobility Overal bed mobility: Needs Assistance Bed Mobility: Supine to Sit     Supine to sit: Min assist     General bed mobility comments: Min assist to support RLE and knee as pt transitioned to sitting  Transfers Overall transfer level: Needs assistance Equipment used: Rolling walker (2 wheeled) Transfers: Sit to/from Stand Sit to Stand: Min assist         General transfer comment: Cues for hand palcement and safety; exquisite pain R knee  Ambulation/Gait Ambulation/Gait assistance: Supervision Ambulation Distance (Feet): 15 Feet Assistive device: Rolling walker (2 wheeled) Gait Pattern/deviations: Step-to pattern;Antalgic Gait velocity: quite slow   General Gait Details: Cues for gait sequence, and to bear weight into RW through UEs to unweigh painful RLE while advancing LLE  Stairs             Wheelchair Mobility    Modified Rankin (Stroke Patients Only)       Balance                                             Pertinent Vitals/Pain Pain Assessment: 0-10 Pain Score: 10-Worst pain ever Pain Location: R knee, anterolateral aspect mostly; Pain posteriorly with active flexion Pain Descriptors / Indicators: Aching;Grimacing;Nagging Pain Intervention(s): Limited activity within patient's tolerance;Monitored during session;Repositioned;Patient requesting pain meds-RN notified;Ice applied    Home Living Family/patient expects to be discharged to:: Private residence Living Arrangements: Spouse/significant other Available Help at Discharge: Family;Available 24 hours/day Type of Home: House Home Access: Stairs to enter Entrance Stairs-Rails: None Entrance Stairs-Number of Steps: 5 Home Layout: One level Home Equipment: None      Prior Function Level of Independence: Independent               Hand Dominance        Extremity/Trunk Assessment   Upper Extremity Assessment: Overall WFL for tasks assessed           Lower Extremity Assessment: RLE deficits/detail RLE Deficits / Details: Constant knee pain which is exaccerbated with active flex/extend and especially worsens with weight bearing; Knee joint effusion noted as well    Cervical / Trunk Assessment: Normal  Communication   Communication: No difficulties  Cognition Arousal/Alertness: Awake/alert Behavior During Therapy: WFL for tasks assessed/performed Overall Cognitive Status: Within Functional Limits for tasks assessed  General Comments      Exercises        Assessment/Plan    PT Assessment Patient needs continued PT services  PT Diagnosis Difficulty walking;Abnormality of gait;Acute pain   PT Problem List Decreased strength;Decreased range of motion;Decreased activity tolerance;Decreased balance;Decreased mobility;Decreased knowledge of  use of DME;Pain  PT Treatment Interventions DME instruction;Gait training;Stair training;Functional mobility training;Therapeutic activities;Therapeutic exercise;Balance training;Patient/family education   PT Goals (Current goals can be found in the Care Plan section) Acute Rehab PT Goals Patient Stated Goal: less pain PT Goal Formulation: With patient Time For Goal Achievement: 10/06/14 Potential to Achieve Goals: Good    Frequency Min 3X/week   Barriers to discharge        Co-evaluation               End of Session   Activity Tolerance: Patient tolerated treatment well;Patient limited by pain Patient left: in chair;with call bell/phone within reach Nurse Communication: Mobility status;Patient requests pain meds         Time: [id number] PT Time Calculation (min) (ACUTE ONLY): 27 min   Charges:   PT Evaluation $Initial PT Evaluation Tier I: 1 Procedure PT Treatments $Gait Training: 8-22 mins   PT G Codes:        Quin Hoop 09/22/2014, 10:31 AM  Roney [place], [place] Pager [phone] Office [phone]

## 2014-09-22 NOTE — Progress Notes (Signed)
Arrival Method: Patient arrived in stretcher from ED. Mental Orientation: alert and oriented x 4Telemetry: Assessment: See Doc Flow sheets. Skin: Warm, dry and intact. IV: Peripheral  IV R a/c ns@75  Pain:.previously medicated Fall Prevention Safety Plan: Patient educated about fall prevention safety plan, understood and acknowledged. Admission Screening:6700 Orientation: Patient has been oriented to the unit, staff and to the room. Call bell in reach  Bed low and all questions answered

## 2014-09-23 LAB — RENAL FUNCTION PANEL
ALBUMIN: 2.5 g/dL — AB (ref 3.5–5.2)
Anion gap: 8 (ref 5–15)
BUN: 36 mg/dL — ABNORMAL HIGH (ref 6–23)
CHLORIDE: 103 mmol/L (ref 96–112)
CO2: 27 mmol/L (ref 19–32)
CREATININE: 3.64 mg/dL — AB (ref 0.50–1.35)
Calcium: 8.6 mg/dL (ref 8.4–10.5)
GFR calc Af Amer: 20 mL/min — ABNORMAL LOW (ref >90)
GFR, EST NON AFRICAN AMERICAN: 17 mL/min — AB (ref >90)
Glucose, Bld: 89 mg/dL (ref 70–99)
POTASSIUM: 3.7 mmol/L (ref 3.5–5.1)
Phosphorus: 4.3 mg/dL (ref 2.3–4.6)
Sodium: 138 mmol/L (ref 135–145)

## 2014-09-23 LAB — HEMOGLOBIN A1C
Hgb A1c MFr Bld: 6.4 % — ABNORMAL HIGH (ref 4.8–5.6)
Mean Plasma Glucose: 137 mg/dL

## 2014-09-23 LAB — CK: CK TOTAL: 473 U/L — AB (ref 7–232)

## 2014-09-23 NOTE — Consult Note (Signed)
ORTHOPAEDIC CONSULTATION  REQUESTING PHYSICIAN: Delfina Redwood, MD  Chief Complaint: right knee pain   HPI: [name] is a 54 y.o. male who complains of  Right knee pain and swelling.  An aspiration was done yesterday that revealed a WBC of 3700 and no crystals.  Culture is still pending.  Patient has a history of OA in the right knee but feels that this pain and swelling is different.  Past Medical History  Diagnosis Date  . Hyperlipidemia   . Hypertension   . Gout     STABLE  PER PT 10-22-2013  . BPH (benign prostatic hypertrophy)   . Elevated PSA   . Microhematuria   . Borderline diabetes mellitus   . Nocturia   . Arthritis     SPINE  . Wears glasses   . CKD (chronic kidney disease), stage III   . OSA (obstructive sleep apnea)     PER PT STUDY DONE 2005 (APPROX).  NON- COMPLIANT CPAP  . H/O hiatal hernia   . Diabetes mellitus without complication     borderline on no meds    Past Surgical History  Procedure Laterality Date  . Transthoracic echocardiogram  07-03-2006    MILD - MODERATE LVH/  EF 55-60%/  GRADE I DIASTOIC DYSFUNCTION/  TRIVIAL  TR /  TRIVIAL PERICARDIAL EFFUSION  . Right hand surgery  YRS AGO  . Cardiac catheterization  10-04-2006   DR [facility]    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%/  2ndDIAGONAL 30%/  RCA 40%/  EF 60%  . Cardiac catheterization  04-07-2002  DR [facility]    NON-CRITICAL CAD/  PRESERVED LV  . Prostate biopsy N/A 10/27/2013    Procedure: BIOPSY TRANSRECTAL ULTRASONIC PROSTATE (TUBP);  Surgeon: Molli Hazard, MD;  Location: [facility];  Service: Urology;  Laterality: N/A;  . Cystoscopy w/ retrogrades Bilateral 10/27/2013    Procedure: CYSTOSCOPY WITH RETROGRADE PYELOGRAM;  Surgeon: Molli Hazard, MD;  Location: [facility];  Service: Urology;  Laterality: Bilateral;  . Robot assisted laparoscopic radical prostatectomy N/A 12/18/2013    Procedure: ROBOTIC ASSISTED LAPAROSCOPIC RADICAL  PROSTATECTOMY AND INDOCYANINE GREEN DYE;  Surgeon: Alexis Frock, MD;  Location: [facility];  Service: Urology;  Laterality: N/A;  . Lymphadenectomy Bilateral 12/18/2013    Procedure: PELVIC LYMPH NODE DISSECTION;  Surgeon: Alexis Frock, MD;  Location: [facility];  Service: Urology;  Laterality: Bilateral;   History   Social History  . Marital Status: Married    Spouse Name: N/A  . Number of Children: N/A  . Years of Education: N/A   Social History Main Topics  . Smoking status: Never Smoker   . Smokeless tobacco: Never Used  . Alcohol Use: No  . Drug Use: No  . Sexual Activity: Not on file   Other Topics Concern  . None   Social History Narrative   Family History  Problem Relation Age of Onset  . Cirrhosis Father   . Other Mother     shot and killed   Allergies  Allergen Reactions  . Penicillins Nausea And Vomiting and Rash   Prior to Admission medications   Medication Sig Start Date End Date Taking? Authorizing Provider  allopurinol (ZYLOPRIM) 300 MG tablet Take 1 tablet (300 mg total) by mouth daily. 12/21/13  Yes Festus Aloe, MD  amLODipine (NORVASC) 10 MG tablet Take 10 mg by mouth every morning.    Yes Historical Provider, MD  HYDROcodone-acetaminophen (NORCO/VICODIN) 5-325 MG per tablet Take  1 tablet by mouth every 4 (four) hours as needed. 09/19/14  Yes Kristen N Ward, DO  levofloxacin (LEVAQUIN) 500 MG tablet Take 1 tablet (500 mg total) by mouth daily. 09/19/14  Yes Kristen N Ward, DO  triamcinolone cream (KENALOG) 0.1 % Apply 1 application topically 2 (two) times daily. 08/11/14  Yes Historical Provider, MD   US Renal  09/21/2014   CLINICAL DATA:  Acute renal failure  EXAM: RENAL/URINARY TRACT ULTRASOUND COMPLETE  COMPARISON:  CT scan 04/25/2013  FINDINGS: Right Kidney:  Length: 10.6 cm. Normal renal cortical thickness for age. Diffuse increased echogenicity suggesting medical renal disease. No hydronephrosis or renal mass. No definite renal calculi.  Left Kidney:  Length:  10.5 cm. Normal renal cortical thickness. Diffuse increased echogenicity suggesting medical renal disease. No hydronephrosis or renal mass. No definite renal calculi.  Bladder:  Appears normal for degree of bladder distention.  IMPRESSION: Echogenic kidneys suggesting medical renal disease. No hydronephrosis.   Electronically Signed   By: Marijo Sanes M.D.   On: 09/21/2014 18:26    Positive ROS: All other systems have been reviewed and were otherwise negative with the exception of those mentioned in the HPI and as above.  Labs cbc  Recent Labs  09/21/14 1335 09/22/14 0550  WBC 10.9* 8.0  HGB 11.7* 10.9*  HCT 37.1* 35.1*  PLT 244 237    Labs inflam No results for input(s): CRP in the last 72 hours.  Invalid input(s): ESR  Labs coag No results for input(s): INR, PTT in the last 72 hours.  Invalid input(s): PT   Recent Labs  09/22/14 0550 09/23/14 0528  NA 138 138  K 3.3* 3.7  CL 104 103  CO2 26 27  GLUCOSE 95 89  BUN 40* 36*  CREATININE 4.18* 3.64*  CALCIUM 8.4 8.6    Physical Exam: Filed Vitals:   09/23/14 1700  BP: 145/87  Pulse: 80  Temp: 97.5 F (36.4 C)  Resp: 18   General: Alert, no acute distress Cardiovascular: No pedal edema Respiratory: No cyanosis, no use of accessory musculature GI: No organomegaly, abdomen is soft and non-tender Skin: No lesions in the area of chief complaint other than those listed below in MSK exam.  Neurologic: Sensation intact distally Psychiatric: Patient is competent for consent with normal mood and affect Lymphatic: No axillary or cervical lymphadenopathy  MUSCULOSKELETAL:  Right knee has moderate effusion.  No warmth or erythema.  Very tender to palpation especially over MJL and posterior aspect of the knee.  ROM from 15-100 degrees.  Sensation intact.  2+ distal pulses. Other extremities are atraumatic with painless ROM and NVI.  Assessment: Right knee pain/effusion  Plan: Only 3700 WBC and no crystals on  aspiration.  Cultures still pending.  Patient was on antibiotics at the time of aspiration so will await culture results before fully ruling out septic knee, but feel this is unlikely.  Injected the knee with corticosteroid today at bedside. Patient tolerated the procedure well.  Will sign off at this point.  Please call with any questions or concerns.  Please notify if culture comes back positive for growth. Weight Bearing Status: WBAT in the right leg  09/23/2014  6:15 PM  PATIENT:  [name]    PRE-PROCEDURE DIAGNOSIS:  Right knee pain/effusion  POST-OPERATIVE DIAGNOSIS:  Same  PROCEDURE:  Intra-articular injection right knee  PROCEDURE DETAILS:  After informed verbal consent was obtained the superolateral portal was prepped with alcohol and 4 ml of Marcaine and 1  ml of Depo-Medrol (62m) was injected. He tolerated this well and a Band-Aid was placed.  KGae Dry PA-C Cell [phone]  09/23/2014 6:06 PM

## 2014-09-23 NOTE — Progress Notes (Signed)
[facility] PROGRESS NOTE  [name] [id number] DOB: [birth year] DOA: 09/21/2014 PCP: Myriam Jacobson, MD  Assessment/Plan:  Principal Problem:   AKI (acute kidney injury):  In the setting of CKD 3, and recent colchicine. Renal ultrasound shows no hydronephrosis, just medical renal disease. Patient reports that while on colchicine he had multiple loose stools. Likely secondary to prerenal state. Denies NSAID or aspirin products. Improving with IV fluids. No need for inpatient nephrology consult. Reasonable to refer him as an outpatient. Decrease IV to 50 cc/hr Active Problems:   Effusion of right knee:  No crystals seen. Synovial fluid White count 3700. Still very tender warm and swollen.  Patient had been on levofloxacin for several days prior to tap. Could this have lowered synovial fluid white count? Unable to give NSAIDs in this patient with acute on chronic kidney disease. Discussed with T. Percell Miller yesterday. Await eval   Hypokalemia:  Corrected. D/c tele   Essential hypertension, benign:  Blood pressure still high. Pain contributing. meds adjusted yesterday   BPH (benign prostatic hypertrophy)   CKD (chronic kidney disease) stage 3, GFR 30-59 ml/min   Prostate cancer   Epididymitis  Code Status:  full Family Communication:   at bedside 4/7 Disposition Plan:  home  Consultants:    Procedures:  arthrocentesis right knee   Antibiotics:    HPI/Subjective:  pain still severe. No dyspnea. No testicular pain. Good urine output.   Objective: Filed Vitals:   09/23/14 0915  BP: 155/98  Pulse: 78  Temp: 98.3 F (36.8 C)  Resp: 18    Intake/Output Summary (Last 24 hours) at 09/23/14 1231 Last data filed at 09/23/14 [id number]  Gross per 24 hour  Intake    480 ml  Output   2900 ml  Net  -2420 ml   Filed Weights   09/21/14 1037 09/21/14 2234 09/22/14 2109  Weight: 126.1 kg (278 lb) 125.8 kg (277 lb 5.4 oz) 126.2 kg (278 lb 3.5 oz)    Exam:   General:    somewhat uncomfortable appearing. Alert and oriented.   Cardiovascular:  regular rate rhythm without murmurs gallops rubs   Respiratory:  clear to auscultation bilaterally without wheezes rhonchi or rales   Abdomen: Obese. Soft nontender nondistended   Ext:  right knee tender with large effusion. Decreased range of motion due to pain.   Basic Metabolic Panel:  Recent Labs Lab 09/19/14 0249 09/21/14 1335 09/22/14 0550 09/23/14 0528  NA 141 140 138 138  K 3.2* 3.2* 3.3* 3.7  CL 108 103 104 103  CO2 26 25 26 27   GLUCOSE 116* 86 95 89  BUN 27* 46* 40* 36*  CREATININE 3.48* 4.71* 4.18* 3.64*  CALCIUM 8.2* 8.4 8.4 8.6  PHOS  --   --   --  4.3   Liver Function Tests:  Recent Labs Lab 09/23/14 0528  ALBUMIN 2.5*   No results for input(s): LIPASE, AMYLASE in the last 168 hours. No results for input(s): AMMONIA in the last 168 hours. CBC:  Recent Labs Lab 09/19/14 0250 09/21/14 1335 09/22/14 0550  WBC 10.1 10.9* 8.0  NEUTROABS 6.5 7.3  --   HGB 11.2* 11.7* 10.9*  HCT 35.7* 37.1* 35.1*  MCV 80.4 79.6 80.1  PLT 231 244 237   Cardiac Enzymes:  Recent Labs Lab 09/23/14 0528  CKTOTAL 473*   BNP (last 3 results) No results for input(s): BNP in the last 8760 hours.  ProBNP (last 3 results) No results for input(s): PROBNP in  the last 8760 hours.  CBG: No results for input(s): GLUCAP in the last 168 hours.  Recent Results (from the past 240 hour(s))  Urine culture     Status: None   Collection Time: 09/19/14 12:15 AM  Result Value Ref Range Status   Specimen Description URINE, CLEAN CATCH  Final   Special Requests NONE  Final   Colony Count NO GROWTH Performed at [facility]   Final   Culture NO GROWTH Performed at [facility]   Final   Report Status 09/20/2014 FINAL  Final  Body fluid culture     Status: None (Preliminary result)   Collection Time: 09/21/14  3:56 PM  Result Value Ref Range Status   Specimen Description FLUID RIGHT  KNEE  Final   Special Requests Normal  Final   Gram Stain   Final    RARE WBC PRESENT, PREDOMINANTLY PMN NO ORGANISMS SEEN Performed at [facility]    Culture   Final    NO GROWTH 1 DAY Performed at [facility]    Report Status PENDING  Incomplete     Studies: US Renal  09/21/2014   CLINICAL DATA:  Acute renal failure  EXAM: RENAL/URINARY TRACT ULTRASOUND COMPLETE  COMPARISON:  CT scan 04/25/2013  FINDINGS: Right Kidney:  Length: 10.6 cm. Normal renal cortical thickness for age. Diffuse increased echogenicity suggesting medical renal disease. No hydronephrosis or renal mass. No definite renal calculi.  Left Kidney:  Length: 10.5 cm. Normal renal cortical thickness. Diffuse increased echogenicity suggesting medical renal disease. No hydronephrosis or renal mass. No definite renal calculi.  Bladder:  Appears normal for degree of bladder distention.  IMPRESSION: Echogenic kidneys suggesting medical renal disease. No hydronephrosis.   Electronically Signed   By: Marijo Sanes M.D.   On: 09/21/2014 18:26   Dg Knee Complete 4 Views Right  09/21/2014   CLINICAL DATA:  Right knee pain medially for the past 4 days, no known injury, initial encounter.  EXAM: RIGHT KNEE - COMPLETE 4+ VIEW  COMPARISON:  07/01/2008.  FINDINGS: Moderate joint effusion. Trace patellofemoral osteophytosis. No acute osseous abnormality.  IMPRESSION: Moderate joint effusion.   Electronically Signed   By: Lorin Picket M.D.   On: 09/21/2014 14:48    Scheduled Meds: . amLODipine  10 mg Oral q morning - 10a  . diclofenac sodium  2 g Topical QID  . docusate sodium  100 mg Oral BID  . heparin  5,000 Units Subcutaneous 3 times per day  . metoprolol tartrate  25 mg Oral BID  . sodium chloride  3 mL Intravenous Q12H  . triamcinolone cream  1 application Topical BID   Continuous Infusions: . sodium chloride 100 mL/hr at 09/22/14 1342    Time spent: 15 minutes  [facility] [email or web address], password [facility] 09/23/2014, 12:31 PM  LOS: 2 days

## 2014-09-23 NOTE — Progress Notes (Signed)
Physical Therapy Treatment Patient Details Name: [name] MRN: [id number] DOB: 1961-05-28 Today's Date: 09/23/2014    History of Present Illness 54 yo with CKD stage III was recently diagnosed with epididymitis and gout in the right knee discharged on colchicine and levofloxacin; came back with worsening pain R knee    PT Comments    Patient did well with ambulation with crutches today and prefers crutches.  Feel patient will need crutches at d/c.  Patient still needs min assist due to loss of balance today once standing, and overall, feel he will quickly progress to modified independent.    Follow Up Recommendations  No PT follow up     Equipment Recommendations  Crutches    Recommendations for Other Services       Precautions / Restrictions Precautions Precautions: Fall Precaution Comments: Fall risk greatly reduced with assistive device Restrictions Weight Bearing Restrictions: No    Mobility  Bed Mobility Overal bed mobility: Needs Assistance Bed Mobility: Supine to Sit     Supine to sit: Min assist     General bed mobility comments: Min assist to support RLE and knee as pt transitioned to sitting  Transfers Overall transfer level: Needs assistance Equipment used: Crutches Transfers: Sit to/from Stand Sit to Stand: Min guard         General transfer comment: had one loss of balance once standing requiring assistance to regain  Ambulation/Gait Ambulation/Gait assistance: Min guard Ambulation Distance (Feet): 50 Feet Assistive device: Crutches Gait Pattern/deviations: Step-to pattern;Decreased stance time - right;Antalgic Gait velocity: quite slow Gait velocity interpretation: <1.8 ft/sec, indicative of risk for recurrent falls     Stairs            Wheelchair Mobility    Modified Rankin (Stroke Patients Only)       Balance Overall balance assessment: Needs assistance Sitting-balance support: No upper extremity supported Sitting  balance-Leahy Scale: Good     Standing balance support: Bilateral upper extremity supported Standing balance-Leahy Scale: Poor                      Cognition Arousal/Alertness: Awake/alert Behavior During Therapy: WFL for tasks assessed/performed Overall Cognitive Status: Within Functional Limits for tasks assessed                      Exercises General Exercises - Lower Extremity Ankle Circles/Pumps: AROM;Both;10 reps;Seated Quad Sets: AROM;Both;10 reps;Seated    General Comments        Pertinent Vitals/Pain Pain Score: 8  Pain Location: R knee Pain Descriptors / Indicators: Aching Pain Intervention(s): Limited activity within patient's tolerance;Monitored during session;Premedicated before session;Ice applied    Home Living                      Prior Function            PT Goals (current goals can now be found in the care plan section) Progress towards PT goals: Progressing toward goals    Frequency  Min 3X/week    PT Plan Current plan remains appropriate    Co-evaluation             End of Session Equipment Utilized During Treatment: Gait belt Activity Tolerance: Patient tolerated treatment well Patient left: in chair;with call bell/phone within reach     Time: 1040-1100 PT Time Calculation (min) (ACUTE ONLY): 20 min  Charges:  $Gait Training: 8-22 mins  G CodesShanna Cisco 10/09/14, 11:51 AM  Oct 09, 2014 [place], [place]

## 2014-09-24 DIAGNOSIS — M7121 Synovial cyst of popliteal space [Baker], right knee: Secondary | ICD-10-CM

## 2014-09-24 DIAGNOSIS — M25469 Effusion, unspecified knee: Secondary | ICD-10-CM | POA: Insufficient documentation

## 2014-09-24 DIAGNOSIS — M7120 Synovial cyst of popliteal space [Baker], unspecified knee: Secondary | ICD-10-CM | POA: Insufficient documentation

## 2014-09-24 LAB — BASIC METABOLIC PANEL
ANION GAP: 11 (ref 5–15)
BUN: 33 mg/dL — AB (ref 6–23)
CALCIUM: 8.7 mg/dL (ref 8.4–10.5)
CO2: 23 mmol/L (ref 19–32)
Chloride: 105 mmol/L (ref 96–112)
Creatinine, Ser: 3.3 mg/dL — ABNORMAL HIGH (ref 0.50–1.35)
GFR calc Af Amer: 23 mL/min — ABNORMAL LOW (ref >90)
GFR, EST NON AFRICAN AMERICAN: 20 mL/min — AB (ref >90)
GLUCOSE: 99 mg/dL (ref 70–99)
Potassium: 3.6 mmol/L (ref 3.5–5.1)
Sodium: 139 mmol/L (ref 135–145)

## 2014-09-24 LAB — MAGNESIUM: MAGNESIUM: 2.2 mg/dL (ref 1.5–2.5)

## 2014-09-24 MED ORDER — OXYCODONE-ACETAMINOPHEN 5-325 MG PO TABS: 1.0000 | ORAL_TABLET | ORAL | Status: DC | PRN

## 2014-09-24 MED ORDER — ACETAMINOPHEN 325 MG PO TABS: 650.0000 mg | ORAL_TABLET | Freq: Four times a day (QID) | ORAL | Status: DC | PRN

## 2014-09-24 MED ORDER — DICLOFENAC SODIUM 1 % TD GEL: 2.0000 g | Freq: Four times a day (QID) | TRANSDERMAL | Status: DC

## 2014-09-24 NOTE — Progress Notes (Signed)
Orthopedic Tech Progress Note Patient Details:  [name] [birth year] [id number] PT to work with patient on crutches. Ortho Devices Type of Ortho Device: Crutches Ortho Device/Splint Interventions: Ordered, Adjustment   Braulio Bosch 09/24/2014, 2:55 PM

## 2014-09-24 NOTE — Progress Notes (Addendum)
Physical Therapy Treatment Patient Details Name: [name] MRN: [id number] DOB: Aug 27, 1960 Today's Date: 09/24/2014    History of Present Illness 54 yo with CKD stage III was recently diagnosed with epididymitis and gout in the right knee discharged on colchicine and levofloxacin; came back with worsening pain R knee on 09/21/14.    PT Comments    Patient reports right knee pain decreased to 3/10 following injection 09/23/14 and pain meds today.  Noted decreased ROM (-10* extension; 60* flexion).  Did well with crutches ambulating 180'.  Patient declined any OP PT at this time.  Encouraged patient to f/u with MD for PT referral if pain/ROM does not continue to improve.  Patient ready for d/c from PT perspective.   Follow Up Recommendations  No PT follow up (Instr pt to ask MD for OP PT referral if knee pain continues)     Equipment Recommendations  Crutches    Recommendations for Other Services       Precautions / Restrictions Precautions Precautions: None Restrictions Weight Bearing Restrictions: Yes RLE Weight Bearing: Weight bearing as tolerated    Mobility  Bed Mobility Overal bed mobility: Modified Independent Bed Mobility: Supine to Sit;Sit to Sidelying     Supine to sit: Modified independent (Device/Increase time) Sit to supine: Modified independent (Device/Increase time)   General bed mobility comments: No physical assist needed.  Patient did use bed rail to assist with moving to sitting.  Transfers Overall transfer level: Needs assistance Equipment used: Crutches Transfers: Sit to/from Stand Sit to Stand: Supervision         General transfer comment: Verbal cues for safe use of crutches during transfers.  No physical assist needed.  Supervision for safety only.  Ambulation/Gait Ambulation/Gait assistance: Supervision Ambulation Distance (Feet): 180 Feet Assistive device: Crutches Gait Pattern/deviations: Step-to pattern;Decreased stance time -  right;Decreased step length - left;Decreased stride length;Decreased weight shift to right;Trunk flexed Gait velocity: Decreased Gait velocity interpretation: Below normal speed for age/gender General Gait Details: Patient using correct gait sequence.  Cues to stand upright to avoid leaning on axilla.     Stairs            Wheelchair Mobility    Modified Rankin (Stroke Patients Only)       Balance                                    Cognition Arousal/Alertness: Awake/alert Behavior During Therapy: WFL for tasks assessed/performed Overall Cognitive Status: Within Functional Limits for tasks assessed                      Exercises General Exercises - Lower Extremity Ankle Circles/Pumps: AROM;Both;10 reps;Seated Long Arc Quad: AROM;Right;5 reps;Seated Heel Slides: AROM;Right;5 reps;Seated Straight Leg Raises: AROM;Right;5 reps;Supine    General Comments General comments (skin integrity, edema, etc.): Rt knee ROM at -10* ext to  60* flexion.  Reports pain decrease following injection 09/23/14.      Pertinent Vitals/Pain Pain Assessment: 0-10 Pain Score: 3  Pain Location: Rt knee Pain Descriptors / Indicators: Aching;Throbbing Pain Intervention(s): Monitored during session;Repositioned;Patient requesting pain meds-RN notified;RN gave pain meds during session    Home Living                      Prior Function            PT Goals (current goals can now be  found in the care plan section) Progress towards PT goals: Progressing toward goals    Frequency  Min 3X/week    PT Plan Current plan remains appropriate    Co-evaluation             End of Session   Activity Tolerance: Patient tolerated treatment well Patient left: in bed;with call bell/phone within reach (sitting EOB)     Time: 1410-1426 and 14:53-15:11 PT Time Calculation (min) (ACUTE ONLY): 16 min + 18 min = 34 min total  Charges:  $Gait Training: 23-37  mins                    G CodesDespina Pole 2014-10-07, 3:23 PM Carita Pian. Sanjuana Kava, [place] Pager [phone]

## 2014-09-24 NOTE — Care Management Note (Signed)
CARE MANAGEMENT NOTE 09/24/2014  Patient:  [name], [name]   Account Number:  [id number]  Date Initiated:  09/24/2014  Documentation initiated by:  Janes Colegrove  Subjective/Objective Assessment:   CM following for progression and d/c planning since adm on 09/21/14.     Action/Plan:   09/24/14 Met with pt , no d/c needs identified, pt has crutches, no HHPT recommended. Plan for d/c today.   Anticipated DC Date:  09/24/2014   Anticipated DC Plan:  HOME/SELF CARE         Choice offered to / List presented to:             Status of service:  Completed, signed off Medicare Important Message given?  NO (If response is "NO", the following Medicare IM given date fields will be blank) Date Medicare IM given:   Medicare IM given by:   Date Additional Medicare IM given:   Additional Medicare IM given by:    Discharge Disposition:  HOME/SELF CARE  Per UR Regulation:    If discussed at Long Length of Stay Meetings, dates discussed:    Comments:

## 2014-09-24 NOTE — Progress Notes (Signed)
To Whom It May Concern:    [name] is unable to work from 09/21/2014 through 10/02/2014 due to medical condition.    Sincerely,    Doree Barthel, MD [facility]

## 2014-09-24 NOTE — Progress Notes (Signed)
°   09/24/14 0855  Clinical Encounter Type  Visited With Patient and family together  Visit Type Initial;Spiritual support  Referral From Nurse  Spiritual Encounters  Spiritual Needs Prayer;Emotional  Stress Factors  Patient Stress Factors Health changes;Major life changes  Family Stress Factors None identified  Advance Directives (For Healthcare)  Does patient have an advance directive? No  Would patient like information on creating an advanced directive? No - patient declined information   Patient was alert and sitting up in bed. Patient is being discharged tomorrow, but is hoping for today. Patient is concerned about working while he is able as his leg and kidneys are causing him issues. Patient is going to retire in December if he is able to continue his current schedule. 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 Patient has a good support system and requested prayer for strength as he continues to heal. Patient is prepared to leave the hospital and focus on healing his body. Patient had a phone call come in and a family member come to visit. Patient is optimistic about the days ahead, and spiritually grounded in his life.  Drue Dun, Chaplain Intern 09/24/2014, 9:07 AM

## 2014-09-24 NOTE — Discharge Summary (Signed)
Physician Discharge Summary  [name] [id number] DOB: 24-Nov-1960 DOA: 09/21/2014  PCP: Myriam Jacobson, MD  Admit date: 09/21/2014 Discharge date: 09/24/2014  Time spent: greater than 30 minutes  Recommendations for Outpatient Follow-up:  1. Monitor BMET 2. If knee pain continues, f/u Dr. Alain Marion 3. Monitor blood pressure, adjust meds if needed  Discharge Diagnoses:  Principal Problem:   AKI (acute kidney injury) Active Problems:   Hypokalemia   Essential hypertension, benign   BPH (benign prostatic hypertrophy)   CKD (chronic kidney disease) stage 3, GFR 30-59 ml/min   Prostate cancer   Effusion of right knee   Epididymitis   Discharge Condition: stable  Diet recommendation: heart healthy  Filed Weights   09/21/14 2234 09/22/14 2109 09/23/14 2148  Weight: 125.8 kg (277 lb 5.4 oz) 126.2 kg (278 lb 3.5 oz) 126.3 kg (278 lb 7.1 oz)    History of present illness:  54 year old male patient with known hypertension, dyslipidemia, gout, sleep apnea noncompliant with CPAP, borderline diabetes and chronic kidney disease stage III who has not yet been referred to nephrology. He also has a history of prostate cancer and is status post TURP. The patient was initially evaluated in the ER on 4/2 after complaints of right leg pain and right testicular pain. He was diagnosed with epididymitis and started on Levaquin. Because of swelling in the knee and the leg there were concerns the patient may have gout and possible DVTs and was started on colchicine and was to return on 4/3 to have venous duplex of the right lower extremity. He was unable to return on 4/3 but return to the ER today complaining of 10 out of 10 pain in his right knee posterior thigh and calf. He reports the testicular pain has resolved. He reports taking the Levaquin and other medication as prescribed. No other symptoms reported. Venous duplex was eventually completed that basically showed a Baker cyst but no DVT. 4  view x-ray of right knee demonstrated moderate joint effusion. The knee was tapped and cytology revealed cloudy appearing yellow fluid without any crystals with minimally elevated WBC and neutrophils consistent with an inflammatory pattern. Unfortunately patient now had acute renal failure with a BUN of 46 and a creatinine of 4.71. On 4/to his BUN was 27 and creatinine was 3.48. Baseline renal function BUN 17 and creatinine 2.16. I have reviewed the patient's preadmission medications as they're listed in the medication reconciliation and it appears he was only taking Norvasc, Zyloprim, Levaquin and Vicodin. Patient told me he was taking colchicine for the past 2 days and was taking hydrochlorothiazide but this needs to be confirmed. Currently patient is only complaining of right knee pain but states it is much better than it was when he presented. Again he reports that the right testicular pain has resolved. He is not having any trouble breathing, no chest pain or palpitations or other symptoms.  Hospital Course:  AKI (acute kidney injury): In the setting of CKD 3, and recent colchicine. Renal ultrasound shows no hydronephrosis, just medical renal disease. Patient reports that while on colchicine he had multiple loose stools. Likely secondary to prerenal state. Denies NSAID or aspirin products. Improving with IV fluids. At discharge, creatinine 3.3   Effusion of right knee: No crystals seen. Synovial fluid White count 3700. Orthopedics consulted and injected steroid. Culture negative to date. Patient worked with PT who recommend crutches.  If pain continues, may f/u with Dr. Alain Marion as outpatient   Hypokalemia: corrected after  repletion   Essential hypertension, benign: amlodipine continued. BP ran high. May be pain related. F/u as outpt   CKD (chronic kidney disease) stage 3, GFR 30-59 ml/min   Epididymitis: may continue abx  Procedures:  Arthrocentesis of right knee and injection of  steroids  Consultations:  orthopedics  Discharge Exam: Filed Vitals:   09/24/14 0545  BP: 131/72  Pulse: 84  Temp: 98.5 F (36.9 C)  Resp: 18    General: a and o Cardiovascular: RRR Respiratory: CTA Ext right knee with decreased ROM. Less swollen and tender  Discharge Instructions   Discharge Instructions    Crutches    Complete by:  As directed      Diet - low sodium heart healthy    Complete by:  As directed           Current Discharge Medication List    START taking these medications   Details  acetaminophen (TYLENOL) 325 MG tablet Take 2 tablets (650 mg total) by mouth every 6 (six) hours as needed for mild pain (or Fever >/= 101).    diclofenac sodium (VOLTAREN) 1 % GEL Apply 2 g topically 4 (four) times daily. To right knee Qty: 100 g, Refills: 0    oxyCODONE-acetaminophen (ROXICET) 5-325 MG per tablet Take 1 tablet by mouth every 4 (four) hours as needed for severe pain. Qty: 20 tablet, Refills: 0      CONTINUE these medications which have NOT CHANGED   Details  allopurinol (ZYLOPRIM) 300 MG tablet Take 1 tablet (300 mg total) by mouth daily. Qty: 6 tablet, Refills: 0    amLODipine (NORVASC) 10 MG tablet Take 10 mg by mouth every morning.     levofloxacin (LEVAQUIN) 500 MG tablet Take 1 tablet (500 mg total) by mouth daily. Qty: 10 tablet, Refills: 0    triamcinolone cream (KENALOG) 0.1 % Apply 1 application topically 2 (two) times daily. Refills: 0      STOP taking these medications     HYDROcodone-acetaminophen (NORCO/VICODIN) 5-325 MG per tablet        Allergies  Allergen Reactions  . Penicillins Nausea And Vomiting and Rash   Follow-up Information    Follow up with Myriam Jacobson, MD.   Specialty:  Internal Medicine   Contact information:   [address], [place] [address] [phone]       Follow up with Renette Butters, MD.   Specialty:  Orthopedic Surgery   Why:  if knee pain continues/worsens   Contact  information:   [address] [place] [address] [phone]        The results of significant diagnostics from this hospitalization (including imaging, microbiology, ancillary and laboratory) are listed below for reference.    Significant Diagnostic Studies: US Scrotum  09/19/2014   CLINICAL DATA:  Acute onset of right inferior testicular pain for 10 hours. Initial encounter.  EXAM: ULTRASOUND OF SCROTUM  TECHNIQUE: Complete ultrasound examination of the testicles, epididymis, and other scrotal structures was performed.  COMPARISON:  Scrotal ultrasound performed 04/06/2006  FINDINGS: Right testicle  Measurements: 4.3 x 2.4 x 2.6 cm. No mass or microlithiasis visualized.  Left testicle  Measurements: 4.0 x 2.1 x 2.5 cm. No mass or microlithiasis visualized.  Right epididymis: There is diffuse enlargement of the tail of the right epididymis, with hyperemia and associated pain. This is concerning for acute epididymitis.  Left epididymis:  Normal in size and appearance.  Hydrocele:  A small to moderate right-sided  hydrocele is noted.  Varicocele: A left-sided varicocele is noted, with augmentation on Valsalva maneuver.  IMPRESSION: 1. No evidence of testicular torsion. 2. Diffuse enlargement of the tail of the right epididymis, with hyperemia and associated pain, concerning for acute right-sided epididymitis. 3. Small to moderate associated right-sided hydrocele seen. 4. Left-sided varicocele noted.   Electronically Signed   By: Garald Balding M.D.   On: 09/19/2014 01:36   US Renal  09/21/2014   CLINICAL DATA:  Acute renal failure  EXAM: RENAL/URINARY TRACT ULTRASOUND COMPLETE  COMPARISON:  CT scan 04/25/2013  FINDINGS: Right Kidney:  Length: 10.6 cm. Normal renal cortical thickness for age. Diffuse increased echogenicity suggesting medical renal disease. No hydronephrosis or renal mass. No definite renal calculi.  Left Kidney:  Length: 10.5 cm. Normal renal cortical thickness. Diffuse  increased echogenicity suggesting medical renal disease. No hydronephrosis or renal mass. No definite renal calculi.  Bladder:  Appears normal for degree of bladder distention.  IMPRESSION: Echogenic kidneys suggesting medical renal disease. No hydronephrosis.   Electronically Signed   By: Marijo Sanes M.D.   On: 09/21/2014 18:26   [place] Art/ven Flow Abd Pelv Doppler  09/19/2014   CLINICAL DATA:  Acute onset of right inferior testicular pain for 10 hours. Initial encounter.  EXAM: ULTRASOUND OF SCROTUM  TECHNIQUE: Complete ultrasound examination of the testicles, epididymis, and other scrotal structures was performed.  COMPARISON:  Scrotal ultrasound performed 04/06/2006  FINDINGS: Right testicle  Measurements: 4.3 x 2.4 x 2.6 cm. No mass or microlithiasis visualized.  Left testicle  Measurements: 4.0 x 2.1 x 2.5 cm. No mass or microlithiasis visualized.  Right epididymis: There is diffuse enlargement of the tail of the right epididymis, with hyperemia and associated pain. This is concerning for acute epididymitis.  Left epididymis:  Normal in size and appearance.  Hydrocele:  A small to moderate right-sided hydrocele is noted.  Varicocele: A left-sided varicocele is noted, with augmentation on Valsalva maneuver.  IMPRESSION: 1. No evidence of testicular torsion. 2. Diffuse enlargement of the tail of the right epididymis, with hyperemia and associated pain, concerning for acute right-sided epididymitis. 3. Small to moderate associated right-sided hydrocele seen. 4. Left-sided varicocele noted.   Electronically Signed   By: Garald Balding M.D.   On: 09/19/2014 01:36   Dg Knee Complete 4 Views Right  09/21/2014   CLINICAL DATA:  Right knee pain medially for the past 4 days, no known injury, initial encounter.  EXAM: RIGHT KNEE - COMPLETE 4+ VIEW  COMPARISON:  07/01/2008.  FINDINGS: Moderate joint effusion. Trace patellofemoral osteophytosis. No acute osseous abnormality.  IMPRESSION: Moderate joint effusion.    Electronically Signed   By: Lorin Picket M.D.   On: 09/21/2014 14:48    Microbiology: Recent Results (from the past 240 hour(s))  Urine culture     Status: None   Collection Time: 09/19/14 12:15 AM  Result Value Ref Range Status   Specimen Description URINE, CLEAN CATCH  Final   Special Requests NONE  Final   Colony Count NO GROWTH Performed at [facility]   Final   Culture NO GROWTH Performed at [facility]   Final   Report Status 09/20/2014 FINAL  Final  Body fluid culture     Status: None (Preliminary result)   Collection Time: 09/21/14  3:56 PM  Result Value Ref Range Status   Specimen Description FLUID RIGHT KNEE  Final   Special Requests Normal  Final   Gram Stain   Final  RARE WBC PRESENT, PREDOMINANTLY PMN NO ORGANISMS SEEN Performed at [facility]    Culture   Final    NO GROWTH 1 DAY Performed at [facility]    Report Status PENDING  Incomplete     Labs: Basic Metabolic Panel:  Recent Labs Lab 09/19/14 0249 09/21/14 1335 09/22/14 0550 09/23/14 0528 09/24/14 0616  NA 141 140 138 138 139  K 3.2* 3.2* 3.3* 3.7 3.6  CL 108 103 104 103 105  CO2 26 25 26 27 23   GLUCOSE 116* 86 95 89 99  BUN 27* 46* 40* 36* 33*  CREATININE 3.48* 4.71* 4.18* 3.64* 3.30*  CALCIUM 8.2* 8.4 8.4 8.6 8.7  MG  --   --   --   --  2.2  PHOS  --   --   --  4.3  --    Liver Function Tests:  Recent Labs Lab 09/23/14 0528  ALBUMIN 2.5*   No results for input(s): LIPASE, AMYLASE in the last 168 hours. No results for input(s): AMMONIA in the last 168 hours. CBC:  Recent Labs Lab 09/19/14 0250 09/21/14 1335 09/22/14 0550  WBC 10.1 10.9* 8.0  NEUTROABS 6.5 7.3  --   HGB 11.2* 11.7* 10.9*  HCT 35.7* 37.1* 35.1*  MCV 80.4 79.6 80.1  PLT 231 244 237   Cardiac Enzymes:  Recent Labs Lab 09/23/14 0528  CKTOTAL 473*   BNP: BNP (last 3 results) No results for input(s): BNP in the last 8760 hours.  ProBNP (last 3 results) No  results for input(s): PROBNP in the last 8760 hours.  CBG: No results for input(s): GLUCAP in the last 168 hours.     SignedDelfina Redwood  [facility] 09/24/2014, 8:48 AM

## 2014-09-25 LAB — BODY FLUID CULTURE
Culture: NO GROWTH
SPECIAL REQUESTS: NORMAL

## 2014-10-01 ENCOUNTER — Other Ambulatory Visit: Payer: Self-pay | Admitting: Orthopedic Surgery

## 2014-10-01 DIAGNOSIS — M25561 Pain in right knee: Secondary | ICD-10-CM

## 2014-10-22 ENCOUNTER — Other Ambulatory Visit: Payer: 59

## 2014-11-01 ENCOUNTER — Encounter (HOSPITAL_COMMUNITY): Payer: Self-pay

## 2014-11-01 ENCOUNTER — Emergency Department (HOSPITAL_COMMUNITY)
Admission: EM | Admit: 2014-11-01 | Discharge: 2014-11-02 | Disposition: A | Discharge status: Home / Self Care | Payer: 59 | Attending: Emergency Medicine | Admitting: Emergency Medicine

## 2014-11-01 ENCOUNTER — Emergency Department (HOSPITAL_COMMUNITY): Payer: 59

## 2014-11-01 DIAGNOSIS — Z8639 Personal history of other endocrine, nutritional and metabolic disease: Secondary | ICD-10-CM | POA: Insufficient documentation

## 2014-11-01 DIAGNOSIS — Z8669 Personal history of other diseases of the nervous system and sense organs: Secondary | ICD-10-CM | POA: Diagnosis not present

## 2014-11-01 DIAGNOSIS — Z880 Allergy status to penicillin: Secondary | ICD-10-CM | POA: Diagnosis not present

## 2014-11-01 DIAGNOSIS — M5412 Radiculopathy, cervical region: Secondary | ICD-10-CM | POA: Diagnosis not present

## 2014-11-01 DIAGNOSIS — N183 Chronic kidney disease, stage 3 (moderate): Secondary | ICD-10-CM | POA: Insufficient documentation

## 2014-11-01 DIAGNOSIS — I129 Hypertensive chronic kidney disease with stage 1 through stage 4 chronic kidney disease, or unspecified chronic kidney disease: Secondary | ICD-10-CM | POA: Insufficient documentation

## 2014-11-01 DIAGNOSIS — Z791 Long term (current) use of non-steroidal anti-inflammatories (NSAID): Secondary | ICD-10-CM | POA: Diagnosis not present

## 2014-11-01 DIAGNOSIS — Z9889 Other specified postprocedural states: Secondary | ICD-10-CM | POA: Insufficient documentation

## 2014-11-01 DIAGNOSIS — M1990 Unspecified osteoarthritis, unspecified site: Secondary | ICD-10-CM | POA: Insufficient documentation

## 2014-11-01 DIAGNOSIS — Z792 Long term (current) use of antibiotics: Secondary | ICD-10-CM | POA: Diagnosis not present

## 2014-11-01 DIAGNOSIS — Z7952 Long term (current) use of systemic steroids: Secondary | ICD-10-CM | POA: Insufficient documentation

## 2014-11-01 DIAGNOSIS — M25512 Pain in left shoulder: Secondary | ICD-10-CM | POA: Insufficient documentation

## 2014-11-01 DIAGNOSIS — M109 Gout, unspecified: Secondary | ICD-10-CM | POA: Diagnosis not present

## 2014-11-01 DIAGNOSIS — Z79899 Other long term (current) drug therapy: Secondary | ICD-10-CM | POA: Diagnosis not present

## 2014-11-01 DIAGNOSIS — R079 Chest pain, unspecified: Secondary | ICD-10-CM | POA: Diagnosis present

## 2014-11-01 DIAGNOSIS — Z87448 Personal history of other diseases of urinary system: Secondary | ICD-10-CM | POA: Insufficient documentation

## 2014-11-01 DIAGNOSIS — Z8719 Personal history of other diseases of the digestive system: Secondary | ICD-10-CM | POA: Insufficient documentation

## 2014-11-01 DIAGNOSIS — R200 Anesthesia of skin: Secondary | ICD-10-CM | POA: Diagnosis not present

## 2014-11-01 LAB — BASIC METABOLIC PANEL
Anion gap: 11 (ref 5–15)
BUN: 25 mg/dL — AB (ref 6–20)
CO2: 26 mmol/L (ref 22–32)
Calcium: 8 mg/dL — ABNORMAL LOW (ref 8.9–10.3)
Chloride: 105 mmol/L (ref 101–111)
Creatinine, Ser: 3.97 mg/dL — ABNORMAL HIGH (ref 0.61–1.24)
GFR, EST AFRICAN AMERICAN: 18 mL/min — AB (ref >60)
GFR, EST NON AFRICAN AMERICAN: 16 mL/min — AB (ref >60)
Glucose, Bld: 103 mg/dL — ABNORMAL HIGH (ref 65–99)
POTASSIUM: 3.3 mmol/L — AB (ref 3.5–5.1)
SODIUM: 142 mmol/L (ref 135–145)

## 2014-11-01 LAB — CBC
HEMATOCRIT: 35.2 % — AB (ref 39.0–52.0)
HEMOGLOBIN: 10.9 g/dL — AB (ref 13.0–17.0)
MCH: 25.4 pg — ABNORMAL LOW (ref 26.0–34.0)
MCHC: 31 g/dL (ref 30.0–36.0)
MCV: 82.1 fL (ref 78.0–100.0)
Platelets: 241 10*3/uL (ref 150–400)
RBC: 4.29 MIL/uL (ref 4.22–5.81)
RDW: 16.7 % — ABNORMAL HIGH (ref 11.5–15.5)
WBC: 6.7 10*3/uL (ref 4.0–10.5)

## 2014-11-01 LAB — I-STAT TROPONIN, ED: Troponin i, poc: 0.01 ng/mL (ref 0.00–0.08)

## 2014-11-01 MED ORDER — HYDROCODONE-ACETAMINOPHEN 5-325 MG PO TABS
2.0000 | ORAL_TABLET | Freq: Once | ORAL | Status: AC
  Administered 2014-11-01: 2 via ORAL
  Filled 2014-11-01: qty 2

## 2014-11-01 MED ORDER — DIAZEPAM 5 MG PO TABS
5.0000 mg | ORAL_TABLET | Freq: Once | ORAL | Status: AC
  Administered 2014-11-01: 5 mg via ORAL
  Filled 2014-11-01: qty 1

## 2014-11-01 NOTE — ED Notes (Signed)
Pt here for left side shoulder pain, and chest pain, and arm pain, sts intermittent numbness in left arm also.

## 2014-11-02 ENCOUNTER — Emergency Department (HOSPITAL_COMMUNITY): Payer: 59

## 2014-11-02 MED ORDER — HYDROCODONE-ACETAMINOPHEN 5-325 MG PO TABS: 1.0000 | ORAL_TABLET | Freq: Four times a day (QID) | ORAL | Status: DC | PRN

## 2014-11-02 MED ORDER — PREDNISONE 20 MG PO TABS: 40.0000 mg | ORAL_TABLET | Freq: Once | ORAL | Status: DC

## 2014-11-02 MED ORDER — PREDNISONE 20 MG PO TABS
60.0000 mg | ORAL_TABLET | Freq: Once | ORAL | Status: AC
  Administered 2014-11-02: 60 mg via ORAL
  Filled 2014-11-02: qty 3

## 2014-11-02 MED ORDER — DIAZEPAM 5 MG PO TABS: 5.0000 mg | ORAL_TABLET | Freq: Three times a day (TID) | ORAL | Status: DC | PRN

## 2014-11-02 NOTE — Discharge Instructions (Signed)

## 2014-11-02 NOTE — ED Provider Notes (Signed)
CSN: [id number]     Arrival date & time 11/01/14  2001 History   First MD Initiated Contact with Patient 11/01/14 2200     Chief Complaint  Patient presents with  . Chest Pain  . Arm Injury     (Consider location/radiation/quality/duration/timing/severity/associated sxs/prior Treatment) Patient is a 54 y.o. male presenting with chest pain, arm injury, and general illness. The history is provided by the patient.  Chest Pain Associated symptoms: no abdominal pain, no cough, no fever, no shortness of breath and not vomiting   Arm Injury Associated symptoms: no fever   Illness Location:  Left arm Quality:  Numbness/tingling Severity:  Moderate Onset quality:  Gradual Duration:  3 days Timing:  Constant Progression:  Unchanged Chronicity:  Recurrent Context:  Atraumatic L arm pain for the past few days days. Constant Relieved by:  Putting his arm over his head Worsened by:  Moving arm around Associated symptoms: chest pain   Associated symptoms: no abdominal pain, no cough, no fever, no shortness of breath and no vomiting     Past Medical History  Diagnosis Date  . Hyperlipidemia   . Hypertension   . Gout     STABLE  PER PT 10-22-2013  . BPH (benign prostatic hypertrophy)   . Elevated PSA   . Microhematuria   . Borderline diabetes mellitus   . Nocturia   . Arthritis     SPINE  . Wears glasses   . CKD (chronic kidney disease), stage III   . OSA (obstructive sleep apnea)     PER PT STUDY DONE 2005 (APPROX).  NON- COMPLIANT CPAP  . H/O hiatal hernia   . Diabetes mellitus without complication     borderline on no meds    Past Surgical History  Procedure Laterality Date  . Transthoracic echocardiogram  07-03-2006    MILD - MODERATE LVH/  EF 55-60%/  GRADE I DIASTOIC DYSFUNCTION/  TRIVIAL  TR /  TRIVIAL PERICARDIAL EFFUSION  . Right hand surgery  YRS AGO  . 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 Cardiac catheterization  04-07-2002  DR [facility]    NON-CRITICAL CAD/  PRESERVED LV  . Prostate biopsy N/A 10/27/2013    Procedure: BIOPSY TRANSRECTAL ULTRASONIC PROSTATE (TUBP);  Surgeon: Molli Hazard, MD;  [facility];  Service: Urology;  Laterality: N/A;  . Cystoscopy w/ retrogrades Bilateral 10/27/2013    Procedure: CYSTOSCOPY WITH RETROGRADE PYELOGRAM;  Surgeon: Molli Hazard, MD;  Location: [facility];  Service: Urology;  Laterality: Bilateral;  . Robot assisted laparoscopic radical prostatectomy N/A 12/18/2013    Procedure: ROBOTIC ASSISTED LAPAROSCOPIC RADICAL PROSTATECTOMY AND INDOCYANINE GREEN DYE;  Surgeon: Alexis Frock, MD;  Location: [facility];  Service: Urology;  Laterality: N/A;  . Lymphadenectomy Bilateral 12/18/2013    Procedure: PELVIC LYMPH NODE DISSECTION;  Surgeon: Alexis Frock, MD;  Location: [facility];  Service: Urology;  Laterality: Bilateral;   Family History  Problem Relation Age of Onset  . Cirrhosis Father   . Other Mother     shot and killed   History  Substance Use Topics  . Smoking status: Never Smoker   . Smokeless tobacco: Never Used  . Alcohol Use: No    Review of Systems  Constitutional: Negative for fever.  Respiratory: Negative for cough and shortness of breath.   Cardiovascular: Positive for chest pain.  Gastrointestinal: Negative  for vomiting and abdominal pain.  All other systems reviewed and are negative.     Allergies  Penicillins  Home Medications   Prior to Admission medications   Medication Sig Start Date End Date Taking? Authorizing Provider  acetaminophen (TYLENOL) 325 MG tablet Take 2 tablets (650 mg total) by mouth every 6 (six) hours as needed for mild pain (or Fever >/= 101). 09/24/14   Delfina Redwood, MD  allopurinol (ZYLOPRIM) 300 MG tablet Take 1 tablet (300 mg total) by mouth daily. 12/21/13   Festus Aloe, MD  amLODipine (NORVASC) 10 MG tablet Take 10 mg  by mouth every morning.     Historical Provider, MD  diazepam (VALIUM) 5 MG tablet Take 1 tablet (5 mg total) by mouth every 8 (eight) hours as needed for anxiety. 11/02/14   Evelina Bucy, MD  diclofenac sodium (VOLTAREN) 1 % GEL Apply 2 g topically 4 (four) times daily. To right knee 09/24/14   Delfina Redwood, MD  HYDROcodone-acetaminophen (NORCO/VICODIN) 5-325 MG per tablet Take 1 tablet by mouth every 6 (six) hours as needed for moderate pain. 11/02/14   Evelina Bucy, MD  levofloxacin (LEVAQUIN) 500 MG tablet Take 1 tablet (500 mg total) by mouth daily. 09/19/14   Kristen N Ward, DO  oxyCODONE-acetaminophen (ROXICET) 5-325 MG per tablet Take 1 tablet by mouth every 4 (four) hours as needed for severe pain. 09/24/14   Delfina Redwood, MD  predniSONE (DELTASONE) 20 MG tablet Take 2 tablets (40 mg total) by mouth once. 11/02/14   Evelina Bucy, MD  triamcinolone cream (KENALOG) 0.1 % Apply 1 application topically 2 (two) times daily. 08/11/14   Historical Provider, MD   BP 142/95 mmHg  Pulse 71  Temp(Src) 98.7 F (37.1 C) (Oral)  Resp 22  Ht 5\' 11"  (1.803 m)  Wt 270 lb (122.471 kg)  BMI 37.67 kg/m2  SpO2 94% Physical Exam  Constitutional: He is oriented to person, place, and time. He appears well-developed and well-nourished. No distress.  HENT:  Head: Normocephalic and atraumatic.  Mouth/Throat: No oropharyngeal exudate.  Eyes: EOM are normal. Pupils are equal, round, and reactive to light.  Neck: Normal range of motion. Neck supple.  Cardiovascular: Normal rate and regular rhythm.  Exam reveals no friction rub.   No murmur heard. Pulmonary/Chest: Effort normal and breath sounds normal. No respiratory distress. He has no wheezes. He has no rales.  Abdominal: He exhibits no distension. There is no tenderness. There is no rebound.  Musculoskeletal: Normal range of motion. He exhibits no edema.       Right shoulder: He exhibits normal range of motion.       Left shoulder: He exhibits  tenderness (diffuse in deltoid). He exhibits no bony tenderness, no swelling, no effusion, no deformity, no spasm and normal strength.  Neurological: He is alert and oriented to person, place, and time.  Skin: He is not diaphoretic.  Nursing note and vitals reviewed.   ED Course  Procedures (including critical care time) Labs Review Labs Reviewed  CBC - Abnormal; Notable for the following:    Hemoglobin 10.9 (*)    HCT 35.2 (*)    MCH 25.4 (*)    RDW 16.7 (*)    All other components within normal limits  BASIC METABOLIC PANEL - Abnormal; Notable for the following:    Potassium 3.3 (*)    Glucose, Bld 103 (*)    BUN 25 (*)    Creatinine, Ser 3.97 (*)    Calcium  8.0 (*)    GFR calc non Af Amer 16 (*)    GFR calc Af Amer 18 (*)    All other components within normal limits  Randolm Idol, ED    Imaging Review Dg Chest 2 View  11/01/2014   CLINICAL DATA:  Left-sided chest pain 3 days.  EXAM: CHEST  2 VIEW  COMPARISON:  10/31/2013  FINDINGS: Lungs are hypoinflated with minimal elevation of the left hemidiaphragm. There is no focal consolidation or effusion. Cardiomediastinal silhouette and remainder of the exam is unchanged.  IMPRESSION: No active cardiopulmonary disease.   Electronically Signed   By: Marin Olp M.D.   On: 11/01/2014 20:45   Ct Cervical Spine Wo Contrast  11/02/2014   CLINICAL DATA:  Left arm numbness.  Left shoulder pain.  EXAM: CT CERVICAL SPINE WITHOUT CONTRAST  TECHNIQUE: Multidetector CT imaging of the cervical spine was performed without intravenous contrast. Multiplanar CT image reconstructions were also generated.  COMPARISON:  Cervical spine MRI 08/11/2014  FINDINGS: Again seen straightening of normal lordosis. There is no listhesis. Diffuse disc space narrowing from C3-C4 is through C7-T1. Associated endplate spurring with posterior disc osteophyte complex at C5-C6, in combination with facet arthropathy causing narrowing of the spinal canal, better  appreciated on prior MRI. Multilevel facet arthropathy from C3-C4 through C6-C7. Multilevel osseous neural foraminal narrowing, most significant at C4-C5 and C5-C6 on the left. No fracture. The dens is intact. No prevertebral soft tissue edema.  IMPRESSION: Multilevel degenerative disc disease and facet arthropathy. Narrowing of the spinal canal most significant at C5-C6, better appreciated on prior MRI. No acute bony abnormality.   Electronically Signed   By: Jeb Levering M.D.   On: 11/02/2014 01:18     EKG Interpretation   Date/Time:  Sunday Nov 01 2014 20:12:45 EDT Ventricular Rate:  95 PR Interval:  168 QRS Duration: 86 QT Interval:  356 QTC Calculation: 447 R Axis:   -11 Text Interpretation:  Normal sinus rhythm Left ventricular hypertrophy T  wave abnormality, consider lateral ischemia Abnormal ECG Similar to prior  No significant change since last tracing Confirmed by Mingo Amber  MD, [place]  ([id number]) on 11/02/2014 1:54:06 AM      MDM   Final diagnoses:  Cervical radiculopathy  Left arm numbness    40M here with numbness and pain in his L arm. Began 3 days ago. Described as left arm numbness and tingling and pain with movement. No trauma. No CP or SOB.  Constant, mild relief with movement of arm over his head. No frank numbness, but is having some tingling. Good pulse in the arm. With relief while placing arm over the head, I imagine this has a cervical radiculopathy component. CT shows severe narrowing of the neck joint spaces. Patient feeling better with valium, vicodin. Will give steroid burst and send home with pain meds and valium. Patient's lab at baseline. Doubt ACS with pain, seems most like cervical radiculopathy. Given NSR f/u. Stable for discharge.    Evelina Bucy, MD 11/02/14 [phone]

## 2015-01-11 ENCOUNTER — Other Ambulatory Visit: Payer: Self-pay | Admitting: Internal Medicine

## 2015-01-11 DIAGNOSIS — N189 Chronic kidney disease, unspecified: Secondary | ICD-10-CM

## 2015-01-12 ENCOUNTER — Ambulatory Visit
Admission: RE | Admit: 2015-01-12 | Discharge: 2015-01-12 | Disposition: A | Discharge status: Home / Self Care | Payer: 59 | Source: Ambulatory Visit | Attending: Internal Medicine | Admitting: Internal Medicine

## 2015-01-12 DIAGNOSIS — N189 Chronic kidney disease, unspecified: Secondary | ICD-10-CM

## 2015-05-17 ENCOUNTER — Other Ambulatory Visit: Payer: Self-pay | Admitting: *Deleted

## 2015-05-17 DIAGNOSIS — N183 Chronic kidney disease, stage 3 unspecified: Secondary | ICD-10-CM

## 2015-05-17 DIAGNOSIS — Z01810 Encounter for preprocedural cardiovascular examination: Secondary | ICD-10-CM

## 2015-06-15 ENCOUNTER — Encounter: Payer: Self-pay | Admitting: Vascular Surgery

## 2015-06-23 ENCOUNTER — Ambulatory Visit: Payer: 59 | Admitting: Vascular Surgery

## 2015-06-23 ENCOUNTER — Encounter (HOSPITAL_COMMUNITY): Payer: 59

## 2015-06-23 ENCOUNTER — Other Ambulatory Visit (HOSPITAL_COMMUNITY): Payer: 59

## 2015-07-06 ENCOUNTER — Encounter: Payer: Self-pay | Admitting: Vascular Surgery

## 2015-07-14 ENCOUNTER — Encounter: Payer: Self-pay | Admitting: Vascular Surgery

## 2015-07-14 ENCOUNTER — Ambulatory Visit (INDEPENDENT_AMBULATORY_CARE_PROVIDER_SITE_OTHER)
Admission: RE | Admit: 2015-07-14 | Discharge: 2015-07-14 | Disposition: A | Discharge status: Home / Self Care | Payer: 59 | Source: Ambulatory Visit | Attending: Vascular Surgery | Admitting: Vascular Surgery

## 2015-07-14 ENCOUNTER — Ambulatory Visit (INDEPENDENT_AMBULATORY_CARE_PROVIDER_SITE_OTHER): Payer: 59 | Admitting: Vascular Surgery

## 2015-07-14 ENCOUNTER — Ambulatory Visit (HOSPITAL_COMMUNITY)
Admission: RE | Admit: 2015-07-14 | Discharge: 2015-07-14 | Disposition: A | Discharge status: Home / Self Care | Payer: 59 | Source: Ambulatory Visit | Attending: Vascular Surgery | Admitting: Vascular Surgery

## 2015-07-14 VITALS — BP 150/96 | HR 72 | Temp 97.1°F | Resp 16 | Ht 71.0 in | Wt 272.0 lb

## 2015-07-14 DIAGNOSIS — I129 Hypertensive chronic kidney disease with stage 1 through stage 4 chronic kidney disease, or unspecified chronic kidney disease: Secondary | ICD-10-CM | POA: Diagnosis not present

## 2015-07-14 DIAGNOSIS — I82712 Chronic embolism and thrombosis of superficial veins of left upper extremity: Secondary | ICD-10-CM | POA: Diagnosis not present

## 2015-07-14 DIAGNOSIS — Z01810 Encounter for preprocedural cardiovascular examination: Secondary | ICD-10-CM | POA: Insufficient documentation

## 2015-07-14 DIAGNOSIS — R7303 Prediabetes: Secondary | ICD-10-CM | POA: Diagnosis not present

## 2015-07-14 DIAGNOSIS — N183 Chronic kidney disease, stage 3 unspecified: Secondary | ICD-10-CM

## 2015-07-14 DIAGNOSIS — E785 Hyperlipidemia, unspecified: Secondary | ICD-10-CM | POA: Insufficient documentation

## 2015-07-14 NOTE — Progress Notes (Signed)
Referred by:  Lorene Dy, MD [address] [place], [place] [address]  Reason for referral: New access  History of Present Illness  [name] is a 55 y.o. (1961-01-11) male who presents for evaluation for permanent access.  The patient is right hand dominant.  The patient has not had previous access procedures.  Previous central venous cannulation procedures include: none.  The patient has never had a PPM placed.   Past Medical History  Diagnosis Date  . Hyperlipidemia   . Hypertension   . Gout     STABLE  PER PT 10-22-2013  . BPH (benign prostatic hypertrophy)   . Elevated PSA   . Microhematuria   . Borderline diabetes mellitus   . Nocturia   . Arthritis     SPINE  . Wears glasses   . CKD (chronic kidney disease), stage III   . OSA (obstructive sleep apnea)     PER PT STUDY DONE 2005 (APPROX).  NON- COMPLIANT CPAP  . H/O hiatal hernia   . Diabetes mellitus without complication ([place])     borderline on no meds     Past Surgical History  Procedure Laterality Date  . Transthoracic echocardiogram  07-03-2006    MILD - MODERATE LVH/  EF 55-60%/  GRADE I DIASTOIC DYSFUNCTION/  TRIVIAL  TR /  TRIVIAL PERICARDIAL EFFUSION  . Right hand surgery  YRS AGO  . Cardiac catheterization  10-04-2006   DR [facility]    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%/  2ndDIAGONAL 30%/  RCA 40%/  EF 60%  . Cardiac catheterization  04-07-2002  DR [facility]    NON-CRITICAL CAD/  PRESERVED LV  . Prostate biopsy N/A 10/27/2013    Procedure: BIOPSY TRANSRECTAL ULTRASONIC PROSTATE (TUBP);  Surgeon: Molli Hazard, MD;  Location: [facility]  Service: Urology;  Laterality: N/A;  . Cystoscopy w/ retrogrades Bilateral 10/27/2013    Procedure: CYSTOSCOPY WITH RETROGRADE PYELOGRAM;  Surgeon: Molli Hazard, MD;  Location: [facility];  Service: Urology;  Laterality: Bilateral;  . Robot assisted laparoscopic radical prostatectomy N/A 12/18/2013    Procedure: ROBOTIC  ASSISTED LAPAROSCOPIC RADICAL PROSTATECTOMY AND INDOCYANINE GREEN DYE;  Surgeon: Alexis Frock, MD;  Location: [facility];  Service: Urology;  Laterality: N/A;  . Lymphadenectomy Bilateral 12/18/2013    Procedure: PELVIC LYMPH NODE DISSECTION;  Surgeon: Alexis Frock, MD;  Location: [facility];  Service: Urology;  Laterality: Bilateral;  . Prostate surgery      Social History   Social History  . Marital Status: Married    Spouse Name: N/A  . Number of Children: N/A  . Years of Education: N/A   Occupational History  . Not on file.   Social History Main Topics  . Smoking status: Never Smoker   . Smokeless tobacco: Never Used  . Alcohol Use: No  . Drug Use: No  . Sexual Activity: Not on file   Other Topics Concern  . Not on file   Social History Narrative     Family History  Problem Relation Age of Onset  . Cirrhosis Father   . Other Mother     shot and killed    Current Outpatient Prescriptions  Medication Sig Dispense Refill  . acetaminophen (TYLENOL) 325 MG tablet Take 2 tablets (650 mg total) by mouth every 6 (six) hours as needed for mild pain (or Fever >/= 101).    Marland Kitchen amLODipine (NORVASC) 10 MG tablet Take 10 mg by mouth every morning. High Blood prussure    .  cholestyramine light (PREVALITE) 4 GM/DOSE powder Take by mouth 2 (two) times daily.    . diazepam (VALIUM) 5 MG tablet Take 1 tablet (5 mg total) by mouth every 8 (eight) hours as needed for anxiety. 15 tablet 0  . diclofenac sodium (VOLTAREN) 1 % GEL Apply 2 g topically 4 (four) times daily. To right knee 100 g 0  . levofloxacin (LEVAQUIN) 500 MG tablet Take 1 tablet (500 mg total) by mouth daily. 10 tablet 0  . triamcinolone cream (KENALOG) 0.1 % Apply 1 application topically 2 (two) times daily.  0  . VIAGRA 100 MG tablet See admin instructions.  11  . allopurinol (ZYLOPRIM) 300 MG tablet Take 1 tablet (300 mg total) by mouth daily. (Patient not taking: Reported on 07/14/2015) 6 tablet 0  .  HYDROcodone-acetaminophen (NORCO/VICODIN) 5-325 MG per tablet Take 1 tablet by mouth every 6 (six) hours as needed for moderate pain. (Patient not taking: Reported on 07/14/2015) 15 tablet 0  . oxyCODONE-acetaminophen (ROXICET) 5-325 MG per tablet Take 1 tablet by mouth every 4 (four) hours as needed for severe pain. (Patient not taking: Reported on 07/14/2015) 20 tablet 0  . predniSONE (DELTASONE) 20 MG tablet Take 2 tablets (40 mg total) by mouth once. (Patient not taking: Reported on 07/14/2015) 8 tablet 0   No current facility-administered medications for this visit.    Allergies  Allergen Reactions  . Penicillins Nausea And Vomiting and Rash    REVIEW OF SYSTEMS:  (Positives checked otherwise negative)  CARDIOVASCULAR:   [ ]  chest pain,  [ ]  chest pressure,  [ ]  palpitations,  [ ]  shortness of breath when laying flat,  [ ]  shortness of breath with exertion,   [ ]  pain in feet when walking,  [ ]  pain in feet when laying flat, [ ]  history of blood clot in veins (DVT),  [ ]  history of phlebitis,  [ ]  swelling in legs,  [ ]  varicose veins  PULMONARY:   [ ]  productive cough,  [ ]  asthma,  [ ]  wheezing  NEUROLOGIC:   [x]  weakness in arms or legs,  [ ]  numbness in arms or legs,  [ ]  difficulty speaking or slurred speech,  [ ]  temporary loss of vision in one eye,  [ ]  dizziness  HEMATOLOGIC:   [ ]  bleeding problems,  [ ]  problems with blood clotting too easily  MUSCULOSKEL:   [ ]  joint pain, [ ]  joint swelling  GASTROINTEST:   [ ]  vomiting blood,  [ ]  blood in stool     GENITOURINARY:   [ ]  burning with urination,  [x]  blood in urine  PSYCHIATRIC:   [ ]  history of major depression  INTEGUMENTARY:   [ ]  rashes,  [ ]  ulcers  CONSTITUTIONAL:   [ ]  fever,  [ ]  chills   Physical Examination  Filed Vitals:   07/14/15 1539 07/14/15 1550  BP: 154/96 150/96  Pulse: 71 72  Temp: 97.1 F (36.2 C)   TempSrc: Oral   Resp: 16   Height: 5\' 11"  (1.803 m)    Weight: 272 lb (123.378 kg)   SpO2: 100%    Body mass index is 37.95 kg/(m^2).  General: A&O x 3, WD, Obese,   Head: [place]/AT  Ear/Nose/Throat: Hearing grossly intact, nares w/o erythema or drainage, oropharynx w/o Erythema/Exudate, Mallampati score: 3  Eyes: PERRLA, EOMI  Neck: Supple, no nuchal rigidity, no palpable LAD  Pulmonary: Sym exp, good air movt, CTAB, no rales, rhonchi, & wheezing  Cardiac: RRR, Nl S1, S2, no Murmurs, rubs or gallops  Vascular: Vessel Right Left  Radial Palpable Palpable  Ulnar Faintly Palpable Faintly Palpable  Brachial Palpable Palpable  Carotid Palpable, without bruit Palpable, without bruit  Aorta Not palpable N/A  Femoral Palpable Palpable  Popliteal Not palpable Not palpable  PT Not Palpable Not Palpable  DP Palpable Palpable   Gastrointestinal: soft, NTND, -G/R, - HSM, - masses, - CVAT B  Musculoskeletal: M/S 5/5 throughout , Extremities without ischemic changes   Neurologic: CN 2-12 intact , Pain and light touch intact in extremities , Motor exam as listed above  Psychiatric: Judgment intact, Mood & affect appropriate for pt's clinical situation  Dermatologic: See M/S exam for extremity exam, no rashes otherwise noted  Lymph : No Cervical, Axillary, or Inguinal lymphadenopathy    Non-Invasive Vascular Imaging  Vein Mapping  (Date: 07/14/2015):   R arm: acceptable vein conduits include upper arm cephalic and basilic vein  L arm: acceptable vein conduits include upper arm cephalic and basilic vein  BUE Doppler (Date: 07/14/2015):   R arm:   Brachial: tri, 5.6 mm  Radial: tri, 2.9 mm  Ulnar: tri, 1.8 mm  L arm:   Brachial: tri, 5.0 mm  Radial: tri, 2.2 mm  Ulnar: tri, 2.4 mm   Outside Studies/Documentation 3 pages of outside documents were reviewed including: outpatient nephrology chart.   Medical Decision Making  JAMARCO SCHRUPP is a 55 y.o. male who presents with chronic kidney disease stage 3   Based on  vein mapping and examination, this patient's permanent access options include: L BC AVF, staged L BVT, R BC AVF, stage R BVT.  I would start with L BC AVF.  I had an extensive discussion with this patient in regards to the nature of access surgery, including risk, benefits, and alternatives.    The patient is aware that the risks of access surgery include but are not limited to: bleeding, infection, steal syndrome, nerve damage, ischemic monomelic neuropathy, failure of access to mature, and possible need for additional access procedures in the future.  The patient has NOT agreed to proceed with the above procedure.  He will contact us once he is ready to proceed.    Adele Barthel, MD [facility] Office: [phone] Pager: [phone]  07/14/2015, 4:56 PM

## 2015-08-04 ENCOUNTER — Other Ambulatory Visit: Payer: Self-pay

## 2015-08-12 ENCOUNTER — Encounter (HOSPITAL_COMMUNITY)
Admission: RE | Admit: 2015-08-12 | Discharge: 2015-08-12 | Disposition: A | Discharge status: Home / Self Care | Payer: Commercial Managed Care - HMO | Source: Ambulatory Visit | Attending: Vascular Surgery | Admitting: Vascular Surgery

## 2015-08-12 ENCOUNTER — Encounter (HOSPITAL_COMMUNITY): Payer: Self-pay

## 2015-08-12 DIAGNOSIS — N183 Chronic kidney disease, stage 3 (moderate): Secondary | ICD-10-CM | POA: Diagnosis not present

## 2015-08-12 DIAGNOSIS — Z01812 Encounter for preprocedural laboratory examination: Secondary | ICD-10-CM | POA: Diagnosis not present

## 2015-08-12 LAB — GLUCOSE, CAPILLARY: Glucose-Capillary: 105 mg/dL — ABNORMAL HIGH (ref 65–99)

## 2015-08-12 MED ORDER — CHLORHEXIDINE GLUCONATE CLOTH 2 % EX PADS: 6.0000 | MEDICATED_PAD | Freq: Once | CUTANEOUS | Status: DC

## 2015-08-12 NOTE — Pre-Procedure Instructions (Signed)
    [name]  08/12/2015      [id number] [place] [place] [id number] [place] [id number] Phone: [phone] Fax: [phone]    Your procedure is scheduled on 08/17/15.  Report to [facility] Admitting at 530 A.M.  Call this number if you have problems the morning of surgery:  [phone]   Remember:  Do not eat food or drink liquids after midnight.  Take these medicines the morning of surgery with A SIP OF WATER --norvasc,carvedilol,tylenol   Do not wear jewelry, make-up or nail polish.  Do not wear lotions, powders, or perfumes.  You may wear deodorant.  Do not shave 48 hours prior to surgery.  Men may shave face and neck.  Do not bring valuables to the hospital.  [facility] is not responsible for any belongings or valuables.  Contacts, dentures or bridgework may not be worn into surgery.  Leave your suitcase in the car.  After surgery it may be brought to your room.  For patients admitted to the hospital, discharge time will be determined by your treatment team.  Patients discharged the day of surgery will not be allowed to drive home.   Name and phone number of your driver:   Special instructions:   Please read over the following fact sheets that you were given. Pain Booklet, Coughing and Deep Breathing and Surgical Site Infection Prevention

## 2015-08-16 MED ORDER — VANCOMYCIN HCL 10 G IV SOLR
1500.0000 mg | INTRAVENOUS | Status: AC
  Administered 2015-08-17: 1500 mg via INTRAVENOUS
  Filled 2015-08-16: qty 1500

## 2015-08-16 MED ORDER — SODIUM CHLORIDE 0.9 % IV SOLN
INTRAVENOUS | Status: DC
  Administered 2015-08-17 (×2): via INTRAVENOUS

## 2015-08-16 NOTE — Progress Notes (Signed)
Procedure time changed noted, nurse notified patient's wife (patient was unavailable).  Wife verbalized understanding.

## 2015-08-16 NOTE — Progress Notes (Signed)
Patient notified to arrive at 5:30

## 2015-08-17 ENCOUNTER — Ambulatory Visit (HOSPITAL_COMMUNITY): Payer: Commercial Managed Care - HMO | Admitting: Critical Care Medicine

## 2015-08-17 ENCOUNTER — Encounter (HOSPITAL_COMMUNITY): Payer: Self-pay

## 2015-08-17 ENCOUNTER — Ambulatory Visit (HOSPITAL_COMMUNITY)
Admission: RE | Admit: 2015-08-17 | Discharge: 2015-08-17 | Disposition: A | Discharge status: Home / Self Care | Payer: Commercial Managed Care - HMO | Source: Ambulatory Visit | Attending: Vascular Surgery | Admitting: Vascular Surgery

## 2015-08-17 ENCOUNTER — Encounter (HOSPITAL_COMMUNITY)
Admission: RE | Disposition: A | Discharge status: Home / Self Care | Payer: Self-pay | Source: Ambulatory Visit | Attending: Vascular Surgery

## 2015-08-17 ENCOUNTER — Telehealth: Payer: Self-pay

## 2015-08-17 DIAGNOSIS — Z9889 Other specified postprocedural states: Secondary | ICD-10-CM

## 2015-08-17 DIAGNOSIS — Z79899 Other long term (current) drug therapy: Secondary | ICD-10-CM | POA: Insufficient documentation

## 2015-08-17 DIAGNOSIS — E119 Type 2 diabetes mellitus without complications: Secondary | ICD-10-CM | POA: Diagnosis not present

## 2015-08-17 DIAGNOSIS — G4733 Obstructive sleep apnea (adult) (pediatric): Secondary | ICD-10-CM | POA: Diagnosis not present

## 2015-08-17 DIAGNOSIS — I129 Hypertensive chronic kidney disease with stage 1 through stage 4 chronic kidney disease, or unspecified chronic kidney disease: Secondary | ICD-10-CM | POA: Diagnosis not present

## 2015-08-17 DIAGNOSIS — R112 Nausea with vomiting, unspecified: Secondary | ICD-10-CM

## 2015-08-17 DIAGNOSIS — N183 Chronic kidney disease, stage 3 (moderate): Secondary | ICD-10-CM | POA: Diagnosis not present

## 2015-08-17 DIAGNOSIS — E785 Hyperlipidemia, unspecified: Secondary | ICD-10-CM | POA: Insufficient documentation

## 2015-08-17 DIAGNOSIS — E669 Obesity, unspecified: Secondary | ICD-10-CM | POA: Diagnosis not present

## 2015-08-17 DIAGNOSIS — Z6838 Body mass index (BMI) 38.0-38.9, adult: Secondary | ICD-10-CM | POA: Diagnosis not present

## 2015-08-17 HISTORY — PX: AV FISTULA PLACEMENT: SHX1204

## 2015-08-17 LAB — GLUCOSE, CAPILLARY: Glucose-Capillary: 102 mg/dL — ABNORMAL HIGH (ref 65–99)

## 2015-08-17 LAB — POCT I-STAT 4, (NA,K, GLUC, HGB,HCT)
Glucose, Bld: 98 mg/dL (ref 65–99)
HCT: 27 % — ABNORMAL LOW (ref 39.0–52.0)
HEMOGLOBIN: 9.2 g/dL — AB (ref 13.0–17.0)
POTASSIUM: 3.6 mmol/L (ref 3.5–5.1)
Sodium: 142 mmol/L (ref 135–145)

## 2015-08-17 SURGERY — ARTERIOVENOUS (AV) FISTULA CREATION
Anesthesia: General | Site: Arm Lower | Laterality: Left

## 2015-08-17 MED ORDER — FENTANYL CITRATE (PF) 100 MCG/2ML IJ SOLN
INTRAMUSCULAR | Status: DC | PRN
  Administered 2015-08-17 (×5): 25 ug via INTRAVENOUS

## 2015-08-17 MED ORDER — MIDAZOLAM HCL 2 MG/2ML IJ SOLN
INTRAMUSCULAR | Status: AC
Start: 2015-08-17 — End: 2015-08-17
  Filled 2015-08-17: qty 2

## 2015-08-17 MED ORDER — HYDROMORPHONE HCL 1 MG/ML IJ SOLN
0.2500 mg | INTRAMUSCULAR | Status: DC | PRN
  Administered 2015-08-17 (×2): 0.5 mg via INTRAVENOUS

## 2015-08-17 MED ORDER — SUCCINYLCHOLINE CHLORIDE 20 MG/ML IJ SOLN
INTRAMUSCULAR | Status: AC
  Filled 2015-08-17: qty 1

## 2015-08-17 MED ORDER — LIDOCAINE HCL (PF) 1 % IJ SOLN: INTRAMUSCULAR | Status: DC | PRN

## 2015-08-17 MED ORDER — PHENYLEPHRINE HCL 10 MG/ML IJ SOLN
10.0000 mg | INTRAVENOUS | Status: DC | PRN
  Administered 2015-08-17: 25 ug/min via INTRAVENOUS

## 2015-08-17 MED ORDER — SODIUM CHLORIDE 0.9 % IJ SOLN
INTRAMUSCULAR | Status: AC
  Filled 2015-08-17: qty 10

## 2015-08-17 MED ORDER — PROPOFOL 10 MG/ML IV BOLUS
INTRAVENOUS | Status: AC
  Filled 2015-08-17: qty 20

## 2015-08-17 MED ORDER — EPHEDRINE SULFATE 50 MG/ML IJ SOLN
INTRAMUSCULAR | Status: AC
  Filled 2015-08-17: qty 1

## 2015-08-17 MED ORDER — MIDAZOLAM HCL 5 MG/5ML IJ SOLN
INTRAMUSCULAR | Status: DC | PRN
  Administered 2015-08-17: 2 mg via INTRAVENOUS

## 2015-08-17 MED ORDER — ONDANSETRON HCL 4 MG/2ML IJ SOLN
INTRAMUSCULAR | Status: DC | PRN
  Administered 2015-08-17: 4 mg via INTRAVENOUS

## 2015-08-17 MED ORDER — FENTANYL CITRATE (PF) 250 MCG/5ML IJ SOLN
INTRAMUSCULAR | Status: AC
  Filled 2015-08-17: qty 5

## 2015-08-17 MED ORDER — HYDROMORPHONE HCL 1 MG/ML IJ SOLN
INTRAMUSCULAR | Status: AC
  Filled 2015-08-17: qty 1

## 2015-08-17 MED ORDER — LIDOCAINE HCL (CARDIAC) 20 MG/ML IV SOLN
INTRAVENOUS | Status: AC
  Filled 2015-08-17: qty 5

## 2015-08-17 MED ORDER — PROMETHAZINE HCL 25 MG/ML IJ SOLN: 6.2500 mg | INTRAMUSCULAR | Status: DC | PRN

## 2015-08-17 MED ORDER — LIDOCAINE HCL (PF) 1 % IJ SOLN
INTRAMUSCULAR | Status: AC
  Filled 2015-08-17: qty 30

## 2015-08-17 MED ORDER — ONDANSETRON 8 MG PO TBDP: 8.0000 mg | ORAL_TABLET | Freq: Three times a day (TID) | ORAL | Status: DC | PRN

## 2015-08-17 MED ORDER — 0.9 % SODIUM CHLORIDE (POUR BTL) OPTIME
TOPICAL | Status: DC | PRN
  Administered 2015-08-17: 1000 mL

## 2015-08-17 MED ORDER — OXYCODONE-ACETAMINOPHEN 5-325 MG PO TABS: 1.0000 | ORAL_TABLET | Freq: Four times a day (QID) | ORAL | Status: DC | PRN

## 2015-08-17 MED ORDER — HEPARIN SODIUM (PORCINE) 5000 UNIT/ML IJ SOLN
INTRAMUSCULAR | Status: DC | PRN
  Administered 2015-08-17: 500 mL

## 2015-08-17 MED ORDER — PROPOFOL 10 MG/ML IV BOLUS
INTRAVENOUS | Status: DC | PRN
  Administered 2015-08-17: 180 mg via INTRAVENOUS

## 2015-08-17 SURGICAL SUPPLY — 36 items
ARMBAND PINK RESTRICT EXTREMIT (MISCELLANEOUS) ×2 IMPLANT
CANISTER SUCTION 2500CC (MISCELLANEOUS) ×2 IMPLANT
CLIP TI MEDIUM 6 (CLIP) ×2 IMPLANT
CLIP TI WIDE RED SMALL 6 (CLIP) ×2 IMPLANT
COVER PROBE W GEL 5X96 (DRAPES) ×2 IMPLANT
DECANTER SPIKE VIAL GLASS SM (MISCELLANEOUS) ×2 IMPLANT
ELECT REM PT RETURN 9FT ADLT (ELECTROSURGICAL) ×2
ELECTRODE REM PT RTRN 9FT ADLT (ELECTROSURGICAL) ×1 IMPLANT
GLOVE BIO SURGEON STRL SZ 6.5 (GLOVE) ×2 IMPLANT
GLOVE BIO SURGEON STRL SZ7 (GLOVE) ×2 IMPLANT
GLOVE BIOGEL PI IND STRL 6.5 (GLOVE) ×2 IMPLANT
GLOVE BIOGEL PI IND STRL 7.0 (GLOVE) ×3 IMPLANT
GLOVE BIOGEL PI IND STRL 7.5 (GLOVE) ×1 IMPLANT
GLOVE BIOGEL PI INDICATOR 6.5 (GLOVE) ×2
GLOVE BIOGEL PI INDICATOR 7.0 (GLOVE) ×3
GLOVE BIOGEL PI INDICATOR 7.5 (GLOVE) ×1
GLOVE SKINSENSE NS SZ6.5 (GLOVE) ×2
GLOVE SKINSENSE NS SZ7.0 (GLOVE) ×1
GLOVE SKINSENSE STRL SZ6.5 (GLOVE) ×2 IMPLANT
GLOVE SKINSENSE STRL SZ7.0 (GLOVE) ×1 IMPLANT
GOWN STRL REUS W/ TWL LRG LVL3 (GOWN DISPOSABLE) ×4 IMPLANT
GOWN STRL REUS W/TWL LRG LVL3 (GOWN DISPOSABLE) ×4
HEMOSTAT SPONGE AVITENE ULTRA (HEMOSTASIS) IMPLANT
KIT BASIN OR (CUSTOM PROCEDURE TRAY) ×2 IMPLANT
KIT ROOM TURNOVER OR (KITS) ×2 IMPLANT
LIQUID BAND (GAUZE/BANDAGES/DRESSINGS) ×2 IMPLANT
NS IRRIG 1000ML POUR BTL (IV SOLUTION) ×2 IMPLANT
PACK CV ACCESS (CUSTOM PROCEDURE TRAY) ×2 IMPLANT
PAD ARMBOARD 7.5X6 YLW CONV (MISCELLANEOUS) ×4 IMPLANT
SUT MNCRL AB 4-0 PS2 18 (SUTURE) ×2 IMPLANT
SUT PROLENE 6 0 BV (SUTURE) ×8 IMPLANT
SUT PROLENE 7 0 BV 1 (SUTURE) ×2 IMPLANT
SUT VIC AB 3-0 SH 27 (SUTURE) ×1
SUT VIC AB 3-0 SH 27X BRD (SUTURE) ×1 IMPLANT
UNDERPAD 30X30 INCONTINENT (UNDERPADS AND DIAPERS) ×2 IMPLANT
WATER STERILE IRR 1000ML POUR (IV SOLUTION) ×2 IMPLANT

## 2015-08-17 NOTE — Op Note (Signed)
    OPERATIVE NOTE   PROCEDURE: left brachiocephalic arteriovenous fistula placement  PRE-OPERATIVE DIAGNOSIS: chronic kidney disease stage 3   POST-OPERATIVE DIAGNOSIS: same as above   SURGEON: Adele Barthel, MD  ASSISTANT(S): Silva Bandy, PAC   ANESTHESIA: general  ESTIMATED BLOOD LOSS: 50 cc  FINDING(S): 1.  Palpable thrill with faintly palpable radial pulse  SPECIMEN(S):  none  INDICATIONS:   [name] is a 55 y.o. male who presents with chronic kidney disease stage 3.  The patient is scheduled for left brachiocephalic arteriovenous fistula placement.  The patient is aware the risks include but are not limited to: bleeding, infection, steal syndrome, nerve damage, ischemic monomelic neuropathy, failure to mature, and need for additional procedures.  The patient is aware of the risks of the procedure and elects to proceed forward.  DESCRIPTION: After full informed written consent was obtained from the patient, the patient was brought back to the operating room and placed supine upon the operating table.  Prior to induction, the patient received IV antibiotics.   After obtaining adequate anesthesia, the patient was then prepped and draped in the standard fashion for a left arm access procedure.  I turned my attention first to identifying the patient's cephalic vein and brachial artery.  Using SonoSite guidance, the location of these vessels were marked out on the skin.   I made a transverse incision at the level of the antecubitum and dissected through the subcutaneous tissue and fascia to gain exposure of the brachial artery.  This was noted to be 3-4 mm in diameter externally.  This was dissected out proximally and distally and controlled with vessel loops .  I then dissected out the cephalic vein.  This was noted to be 4-5 mm in diameter externally.  The distal segment of the vein was ligated with a  2-0 silk, and the vein was transected.  The proximal segment was interrogated with  serial dilators.  The vein accepted up to a 5 mm dilator without any difficulty.  I then instilled the heparinized saline into the vein and clamped it.  At this point, I reset my exposure of the brachial artery and placed the artery under tension proximally and distally.  I made an arteriotomy with a #11 blade, and then I extended the arteriotomy with a Potts scissor.  I injected heparinized saline proximal and distal to this arteriotomy.  The vein was then sewn to the artery in an end-to-side configuration with a running stitch of 7-0 Prolene.  Prior to completing this anastomosis, I allowed the vein and artery to backbleed.  There was no evidence of clot from any vessels.  I completed the anastomosis in the usual fashion and then released all vessel loops and clamps.  There was a palpable  thrill in the venous outflow, and there was a faintly palpable radial pulse.  At this point, I irrigated out the surgical wound.  There was no further active bleeding.  The subcutaneous tissue was reapproximated with a running stitch of 3-0 Vicryl.  The skin was then reapproximated with a running subcuticular stitch of 4-0 Vicryl.  The skin was then cleaned, dried, and reinforced with Dermabond.  The patient tolerated this procedure well.    COMPLICATIONS: none  CONDITION: stable   Adele Barthel, MD [facility] Office: [phone] Pager: [phone]  08/17/2015, 8:46 AM

## 2015-08-17 NOTE — Anesthesia Procedure Notes (Signed)
Procedure Name: LMA Insertion Date/Time: 08/17/2015 7:39 AM Performed by: Merrilyn Puma B Pre-anesthesia Checklist: Patient identified, Emergency Drugs available, Patient being monitored, Timeout performed and Suction available Patient Re-evaluated:Patient Re-evaluated prior to inductionOxygen Delivery Method: Circle system utilized Preoxygenation: Pre-oxygenation with 100% oxygen Intubation Type: IV induction Ventilation: Mask ventilation without difficulty LMA: LMA inserted LMA Size: 5.0 Placement Confirmation: positive ETCO2 and breath sounds checked- equal and bilateral Dental Injury: Teeth and Oropharynx as per pre-operative assessment

## 2015-08-17 NOTE — Transfer of Care (Signed)
Immediate Anesthesia Transfer of Care Note  Patient: [name]  Procedure(s) Performed: Procedure(s): BRACHIOCEPHALIC ARTERIOVENOUS (AV) FISTULA CREATION  left arm (Left)  Patient Location: PACU  Anesthesia Type:General  Level of Consciousness: awake, alert  and oriented  Airway & Oxygen Therapy: Patient Spontanous Breathing and Patient connected to face mask oxygen  Post-op Assessment: Report given to RN, Post -op Vital signs reviewed and stable and Patient moving all extremities  Post vital signs: Reviewed and stable  Last Vitals:  Filed Vitals:   08/17/15 0603 08/17/15 0910  BP: 151/95 122/81  Pulse: 73 71  Temp: 37.1 C 36.7 C  Resp:  18    Complications: No apparent anesthesia complications

## 2015-08-17 NOTE — Telephone Encounter (Signed)
Discussed pt's symptoms with K. Pablo Ledger, PA.  Recommended Zofran 8 mg, ODT, 1 tab. q 8 hrs/ prn; #15; no refills.  Medication ordered; wife notified.

## 2015-08-17 NOTE — H&P (Signed)
Brief History and Physical  History of Present Illness  [name] is a 55 y.o. male who presents with chief complaint: CKD 3.  The patient presents today for L [facility] AVF placement.    Past Medical History  Diagnosis Date  . Hyperlipidemia   . Hypertension   . Gout     STABLE  PER PT 10-22-2013  . BPH (benign prostatic hypertrophy)   . Elevated PSA   . Microhematuria   . Borderline diabetes mellitus   . Nocturia   . Arthritis     SPINE  . Wears glasses   . CKD (chronic kidney disease), stage III   . OSA (obstructive sleep apnea)     PER PT STUDY DONE 2005 (APPROX).  NON- COMPLIANT CPAP  . H/O hiatal hernia   . Diabetes mellitus without complication ([place])     borderline on no meds     Past Surgical History  Procedure Laterality Date  . Transthoracic echocardiogram  07-03-2006    MILD - MODERATE LVH/  EF 55-60%/  GRADE I DIASTOIC DYSFUNCTION/  TRIVIAL  TR /  TRIVIAL PERICARDIAL EFFUSION  . Right hand surgery  YRS AGO  . Cardiac catheterization  10-04-2006   DR [facility]    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%/  2ndDIAGONAL 30%/  RCA 40%/  EF 60%  . Cardiac catheterization  04-07-2002  DR [facility]    NON-CRITICAL CAD/  PRESERVED LV  . Prostate biopsy N/A 10/27/2013    Procedure: BIOPSY TRANSRECTAL ULTRASONIC PROSTATE (TUBP);  Surgeon: Molli Hazard, MD;  Location: [facility];  Service: Urology;  Laterality: N/A;  . Cystoscopy w/ retrogrades Bilateral 10/27/2013    Procedure: CYSTOSCOPY WITH RETROGRADE PYELOGRAM;  Surgeon: Molli Hazard, MD;  Location: [facility];  Service: Urology;  Laterality: Bilateral;  . Robot assisted laparoscopic radical prostatectomy N/A 12/18/2013    Procedure: ROBOTIC ASSISTED LAPAROSCOPIC RADICAL PROSTATECTOMY AND INDOCYANINE GREEN DYE;  Surgeon: Alexis Frock, MD;  Location[facility];  Service: Urology;  Laterality: N/A;  . Lymphadenectomy Bilateral 12/18/2013    Procedure: PELVIC LYMPH NODE DISSECTION;   Surgeon: Alexis Frock, MD;  Location[facility];  Service: Urology;  Laterality: Bilateral;  . Prostate surgery      Social History   Social History  . Marital Status: Married    Spouse Name: N/A  . Number of Children: N/A  . Years of Education: N/A   Occupational History  . Not on file.   Social History Main Topics  . Smoking status: Never Smoker   . Smokeless tobacco: Never Used  . Alcohol Use: No  . Drug Use: No  . Sexual Activity: Not on file   Other Topics Concern  . Not on file   Social History Narrative    Family History  Problem Relation Age of Onset  . Cirrhosis Father   . Other Mother     shot and killed    No current facility-administered medications on file prior to encounter.   Current Outpatient Prescriptions on File Prior to Encounter  Medication Sig Dispense Refill  . amLODipine (NORVASC) 10 MG tablet Take 10 mg by mouth every morning. High Blood prussure    . triamcinolone cream (KENALOG) 0.1 % Apply 1 application topically 2 (two) times daily as needed (for itching).   0  . VIAGRA 100 MG tablet Take 100 mg by mouth See admin instructions.   11  . acetaminophen (TYLENOL) 325 MG tablet Take 2 tablets (650  mg total) by mouth every 6 (six) hours as needed for mild pain (or Fever >/= 101). (Patient taking differently: Take 650 mg by mouth daily as needed for mild pain (or Fever >/= 101). )    . diazepam (VALIUM) 5 MG tablet Take 1 tablet (5 mg total) by mouth every 8 (eight) hours as needed for anxiety. (Patient taking differently: Take 5 mg by mouth daily as needed for anxiety. ) 15 tablet 0    Allergies  Allergen Reactions  . Penicillins Nausea And Vomiting and Rash    Has patient had a PCN reaction causing immediate rash, facial/tongue/throat swelling, SOB or lightheadedness with hypotension: Yes    Review of Systems: As listed above, otherwise negative.  Physical Examination  Filed Vitals:   08/17/15 0603  BP: 151/95  Pulse: 73  Temp: 98.8  F (37.1 C)  TempSrc: Oral  Height: 5\' 10"  (1.778 m)  Weight: 270 lb (122.471 kg)  SpO2: 100%    General: A&O x 3, WDWN   Pulmonary: Sym exp, good air movt, CTAB, no rales, rhonchi, & wheezing  Cardiac: RRR, Nl S1, S2, no Murmurs, rubs or gallops  Gastrointestinal: soft, NTND, -G/R, - HSM, - masses, - CVAT B  Musculoskeletal: M/S 5/5 throughout , Extremities without ischemic changes   Laboratory See [place] is a 55 y.o. male who presents with: CKD Stage 3.   The patient is scheduled for: L BC AVF  Risk, benefits, and alternatives to access surgery were discussed.  The patient is aware the risks include but are not limited to: bleeding, infection, steal syndrome, nerve damage, ischemic monomelic neuropathy, failure to mature, and need for additional procedures.  The patient is aware of the risks and agrees to proceed.  Jonathan Barthel, MD [facility] [place] Office: [phone] Pager: [phone]  08/17/2015, 7:26 AM

## 2015-08-17 NOTE — Anesthesia Postprocedure Evaluation (Signed)
Anesthesia Post Note  Patient: [name]  Procedure(s) Performed: Procedure(s) (LRB): BRACHIOCEPHALIC ARTERIOVENOUS (AV) FISTULA CREATION  left arm (Left)  Patient location during evaluation: PACU Anesthesia Type: General Level of consciousness: awake and alert Pain management: pain level controlled Vital Signs Assessment: post-procedure vital signs reviewed and stable Respiratory status: spontaneous breathing, nonlabored ventilation, respiratory function stable and patient connected to nasal cannula oxygen Cardiovascular status: blood pressure returned to baseline and stable Postop Assessment: no signs of nausea or vomiting Anesthetic complications: no    Last Vitals:  Filed Vitals:   08/17/15 0910 08/17/15 0915  BP: 122/81 122/81  Pulse: 71 70  Temp: 36.7 C   Resp: 18 15    Last Pain:  Filed Vitals:   08/17/15 0949  PainSc: 0-No pain                 Flynn Lininger,JAMES TERRILL

## 2015-08-17 NOTE — Telephone Encounter (Signed)
rec'd v.m. From pt's wife.  Reported pt. Is c/o nausea and vomiting since he got home from hospital today.  Requested medication for nausea be faxed to [facility] [address].  Called pt.  Reported he is nauseated and has vomited once, since home from hospital today.  Advised will discuss with provider and send Rx to pharmacy.  Advised pt. To avoid taking pain medication on empty stomach.  Verb. Understanding.

## 2015-08-17 NOTE — Anesthesia Preprocedure Evaluation (Addendum)
Anesthesia Evaluation  Patient identified by MRN, date of birth, ID band Patient awake    Reviewed: Allergy & Precautions, NPO status , Patient's Chart, lab work & pertinent test results, reviewed documented beta blocker date and time   Airway Mallampati: II  TM Distance: >3 FB Neck ROM: Full    Dental  (+) Dental Advisory Given   Pulmonary sleep apnea ,    breath sounds clear to auscultation       Cardiovascular hypertension, Pt. on home beta blockers and Pt. on medications  Rhythm:Regular Rate:Normal     Neuro/Psych    GI/Hepatic   Endo/Other  diabetes  Renal/GU Renal InsufficiencyRenal disease     Musculoskeletal  (+) Arthritis ,   Abdominal (+) + obese,   Peds  Hematology   Anesthesia Other Findings   Reproductive/Obstetrics                            Anesthesia Physical Anesthesia Plan  ASA: III  Anesthesia Plan: General   Post-op Pain Management:    Induction: Intravenous  Airway Management Planned: LMA  Additional Equipment:   Intra-op Plan:   Post-operative Plan: Extubation in OR  Informed Consent: I have reviewed the patients History and Physical, chart, labs and discussed the procedure including the risks, benefits and alternatives for the proposed anesthesia with the patient or authorized representative who has indicated his/her understanding and acceptance.   Dental advisory given  Plan Discussed with: Anesthesiologist and Surgeon  Anesthesia Plan Comments:         Anesthesia Quick Evaluation

## 2015-08-18 ENCOUNTER — Encounter (HOSPITAL_COMMUNITY): Payer: Self-pay | Admitting: Vascular Surgery

## 2015-08-19 ENCOUNTER — Telehealth: Payer: Self-pay | Admitting: Vascular Surgery

## 2015-08-19 NOTE — Telephone Encounter (Signed)
LM for pt with appt date/time- dpm

## 2015-08-19 NOTE — Telephone Encounter (Signed)
-----   Message from Mena Goes, RN sent at 08/17/2015 10:33 AM EST ----- Regarding: schedule   ----- Message -----    From: Alvia Grove, PA-C    Sent: 08/17/2015   8:59 AM      To: [facility] Charge Pool  S/p left brachial-cephalic AVF [id number]  F/u with Dr. Bridgett Larsson in 6 weeks. No duplex  Thanks Maudie Mercury

## 2015-09-05 ENCOUNTER — Encounter (HOSPITAL_COMMUNITY): Payer: Self-pay | Admitting: Emergency Medicine

## 2015-09-05 ENCOUNTER — Emergency Department (HOSPITAL_COMMUNITY)
Admission: EM | Admit: 2015-09-05 | Discharge: 2015-09-06 | Disposition: A | Discharge status: Home / Self Care | Payer: 59 | Attending: Emergency Medicine | Admitting: Emergency Medicine

## 2015-09-05 DIAGNOSIS — Z79899 Other long term (current) drug therapy: Secondary | ICD-10-CM | POA: Insufficient documentation

## 2015-09-05 DIAGNOSIS — I129 Hypertensive chronic kidney disease with stage 1 through stage 4 chronic kidney disease, or unspecified chronic kidney disease: Secondary | ICD-10-CM | POA: Diagnosis not present

## 2015-09-05 DIAGNOSIS — N183 Chronic kidney disease, stage 3 (moderate): Secondary | ICD-10-CM | POA: Insufficient documentation

## 2015-09-05 DIAGNOSIS — E119 Type 2 diabetes mellitus without complications: Secondary | ICD-10-CM | POA: Diagnosis not present

## 2015-09-05 DIAGNOSIS — Z880 Allergy status to penicillin: Secondary | ICD-10-CM | POA: Diagnosis not present

## 2015-09-05 DIAGNOSIS — R252 Cramp and spasm: Secondary | ICD-10-CM | POA: Diagnosis present

## 2015-09-05 DIAGNOSIS — E785 Hyperlipidemia, unspecified: Secondary | ICD-10-CM | POA: Diagnosis not present

## 2015-09-05 NOTE — ED Notes (Signed)
Per EMS- Pt was driving and began to have severe cramping in arms and fingers. Pt also sts that he broke out in a sweat and had CP that has since resolved. EMS gave 324 ASA PTA. Pt has new fistula to left upper arm- has not received dialysis yet. Pt was also recently diagnosed with anemia.

## 2015-09-05 NOTE — ED Provider Notes (Addendum)
CSN: [id number]     Arrival date & time 09/05/15  2258 History  By signing my name below, I, Irene Pap, attest that this documentation has been prepared under the direction and in the presence of Varney Biles, MD. Electronically Signed: Irene Pap, ED Scribe. 09/05/2015. 6:45 AM.   Chief Complaint  Patient presents with  . muscle cramping    The history is provided by the patient. No language interpreter was used.  HPI Comments: [name] is a 55 y.o. Male with a hx of left arm fistula, anemia, HTN, BPH, Stage 3 CKD, OSA, and DM brought in by EMS who presents to the Emergency Department complaining of gradually resolving bilateral forearm and hand cramping, left worse than right, onset 2 hours ago. Pt states that he was trying to outstretch his hands while it was cramping, but was unable to and began to experience diaphoresis. He reports associated dizziness, CP and SOB due to pain. He reports that the pain lasted about 45 minutes, but relieved when EMS staff were massaging his arms; he was also given 325 ASA en route. 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 He has not yet received dialysis. He reports a hx of similar symptoms when the fistula was first placed. He denies current CP or SOB. He denies hx of heart attack or stroke. Pt is right hand dominant. Pt is due to follow up with his nephrologist in 3 weeks.   Past Medical History  Diagnosis Date  . Hyperlipidemia   . Hypertension   . Gout     STABLE  PER PT 10-22-2013  . BPH (benign prostatic hypertrophy)   . Elevated PSA   . Microhematuria   . Borderline diabetes mellitus   . Nocturia   . Arthritis     SPINE  . Wears glasses   . CKD (chronic kidney disease), stage III   . OSA (obstructive sleep apnea)     PER PT STUDY DONE 2005 (APPROX).  NON- COMPLIANT CPAP  . H/O hiatal hernia   . Diabetes mellitus without complication ([place])      borderline on no meds    Past Surgical History  Procedure Laterality Date  . Transthoracic echocardiogram  07-03-2006    MILD - MODERATE LVH/  EF 55-60%/  GRADE I DIASTOIC DYSFUNCTION/  TRIVIAL  TR /  TRIVIAL PERICARDIAL EFFUSION  . Right hand surgery  YRS AGO  . Cardiac catheterization  10-04-2006   DR [facility]    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%/  2ndDIAGONAL 30%/  RCA 40%/  EF 60%  . Cardiac catheterization  04-07-2002  DR [facility]    NON-CRITICAL CAD/  PRESERVED LV  . Prostate biopsy N/A 10/27/2013    Procedure: BIOPSY TRANSRECTAL ULTRASONIC PROSTATE (TUBP);  Surgeon: Molli Hazard, MD;  Location: [facility];  Service: Urology;  Laterality: N/A;  . Cystoscopy w/ retrogrades Bilateral 10/27/2013    Procedure: CYSTOSCOPY WITH RETROGRADE PYELOGRAM;  Surgeon: Molli Hazard, MD;  Location: [facility];  Service: Urology;  Laterality: Bilateral;  . Robot assisted laparoscopic radical prostatectomy N/A 12/18/2013    Procedure: ROBOTIC ASSISTED LAPAROSCOPIC RADICAL PROSTATECTOMY AND INDOCYANINE GREEN DYE;  Surgeon: Alexis Frock, MD;  Location: [facility];  Service: Urology;  Laterality: N/A;  . Lymphadenectomy Bilateral 12/18/2013    Procedure: PELVIC LYMPH NODE DISSECTION;  Surgeon: Alexis Frock, MD;  Location: [facility];  Service: Urology;  Laterality: Bilateral;  .  Prostate surgery    . Av fistula placement Left 08/17/2015    Procedure: BRACHIOCEPHALIC ARTERIOVENOUS (AV) FISTULA CREATION  left arm;  Surgeon: Conrad Halbur, MD;  Location: [facility];  Service: Vascular;  Laterality: Left;   Family History  Problem Relation Age of Onset  . Cirrhosis Father   . Other Mother     shot and killed   Social History  Substance Use Topics  . Smoking status: Never Smoker   . Smokeless tobacco: Never Used  . Alcohol Use: No    Review of Systems 10 Systems reviewed and all are negative for acute change except as noted in the HPI.  Allergies  Penicillins  Home  Medications   Prior to Admission medications   Medication Sig Start Date End Date Taking? Authorizing Provider  acetaminophen (TYLENOL) 325 MG tablet Take 2 tablets (650 mg total) by mouth every 6 (six) hours as needed for mild pain (or Fever >/= 101). Patient taking differently: Take 650 mg by mouth daily as needed for mild pain (or Fever >/= 101).  09/24/14  Yes Delfina Redwood, MD  amLODipine (NORVASC) 10 MG tablet Take 10 mg by mouth every morning. High Blood prussure   Yes Historical Provider, MD  carvedilol (COREG) 12.5 MG tablet Take 12.5 mg by mouth daily.  07/12/15  Yes Historical Provider, MD  diazepam (VALIUM) 5 MG tablet Take 1 tablet (5 mg total) by mouth every 8 (eight) hours as needed for anxiety. Patient taking differently: Take 5 mg by mouth daily as needed for anxiety.  11/02/14  Yes Evelina Bucy, MD  VIAGRA 100 MG tablet Take 100 mg by mouth daily as needed for erectile dysfunction.  06/25/15  Yes Historical Provider, MD  methocarbamol (ROBAXIN) 500 MG tablet Take 1 tablet (500 mg total) by mouth 2 (two) times daily. 09/06/15   Dynasti Kerman Kathrynn Humble, MD   BP 126/84 mmHg  Pulse 76  Temp(Src) 98.4 F (36.9 C) (Oral)  Resp 21  Ht 5\' 11"  (1.803 m)  Wt 280 lb (127.007 kg)  BMI 39.07 kg/m2  SpO2 96% Physical Exam  Constitutional: He is oriented to person, place, and time. He appears well-developed and well-nourished.  HENT:  Head: Normocephalic and atraumatic.  Eyes: EOM are normal. Pupils are equal, round, and reactive to light.  Neck: Normal range of motion. Neck supple. JVD present.  Cardiovascular: Normal rate, regular rhythm and normal heart sounds.  Exam reveals no gallop and no friction rub.   No murmur heard. Pulses:      Radial pulses are 2+ on the right side, and 2+ on the left side.  Pulmonary/Chest: Effort normal and breath sounds normal. He has no wheezes.  Anterior lung fields clear  Abdominal: Soft. There is no tenderness.  Musculoskeletal: Normal range of motion.   LUE: AV fistula with surgical wound site showing no evidence of infection; positive palpable thrill  Neurological: He is alert and oriented to person, place, and time.  Skin: Skin is warm and dry.  Psychiatric: He has a normal mood and affect. His behavior is normal.  Nursing note and vitals reviewed.   ED Course  Procedures (including critical care time) DIAGNOSTIC STUDIES: Oxygen Saturation is 97% on RA, normal by my interpretation.    COORDINATION OF CARE: 11:42 PM-Discussed treatment plan which includes labs with pt at bedside and pt agreed to plan.    Labs Review Labs Reviewed  BASIC METABOLIC PANEL - Abnormal; Notable for the following:    Potassium 3.3 (*)  Chloride 112 (*)    CO2 20 (*)    Glucose, Bld 113 (*)    BUN 66 (*)    Creatinine, Ser 11.63 (*)    Calcium 5.8 (*)    GFR calc non Af Amer 4 (*)    GFR calc Af Amer 5 (*)    All other components within normal limits  CBC WITH DIFFERENTIAL/PLATELET - Abnormal; Notable for the following:    RBC 3.25 (*)    Hemoglobin 8.2 (*)    HCT 26.8 (*)    MCH 25.2 (*)    RDW 15.9 (*)    Platelets 146 (*)    All other components within normal limits  PHOSPHORUS - Abnormal; Notable for the following:    Phosphorus 5.2 (*)    All other components within normal limits  MAGNESIUM  CK  TROPONIN I  TROPONIN I    Imaging Review No results found. I have personally reviewed and evaluated these images and lab results as part of my medical decision-making.   EKG Interpretation   Date/Time:  Sunday September 05 2015 23:12:18 EDT Ventricular Rate:  80 PR Interval:  208 QRS Duration: 102 QT Interval:  404 QTC Calculation: 466 R Axis:   -2 Text Interpretation:  Sinus rhythm Borderline prolonged PR interval Low  voltage, precordial leads Borderline T abnormalities, lateral leads  Nonspecific ST and T wave abnormality No acute changes No significant  change since last tracing Confirmed by Kathrynn Humble, MD, Issabelle Mcraney [phone]) on   09/06/2015 12:32:02 AM      MDM   Final diagnoses:  Muscle cramp    I personally performed the services described in this documentation, which was scribed in my presence. The recorded information has been reviewed and is accurate.  Pt comes in with cc of muscle cramping. Very bizarre set of symptoms. Pt had bilateral hand contractions with cramping pain in bilateral forearms, L worse than R. Symptoms lasted for 45 minutes. Symptoms started abruptly and resolved on their own.  Pt had some anterior, mid-sternal chest pain with diophoresis - and that resolved.  We considered DISS / VASS - but having bilateral symptoms not consistent with that. The fustila site looks healthy, and per patient unchanged. Pt has normal distal skin color and pulse and the skin is still warm to touch.  We will get basic labs. Pt has no symptoms right now at all.  He has no fevers, and deneis any prodrome consistent with infection prior to this onset of the symptoms. Will monitor closely.    Varney Biles, MD 09/06/15 0040  @4 :00 am pt started c/o cramping again - this time just the LLE. I spoke with Vascular surgery - he has a great exam - but i am not sure if this is VASS - Dr. Scot Dock saw the patient and clears.  Varney Biles, MD 09/06/15 [phone]

## 2015-09-06 DIAGNOSIS — R252 Cramp and spasm: Secondary | ICD-10-CM | POA: Diagnosis not present

## 2015-09-06 LAB — CBC WITH DIFFERENTIAL/PLATELET
BASOS PCT: 0 %
Basophils Absolute: 0 10*3/uL (ref 0.0–0.1)
EOS ABS: 0.2 10*3/uL (ref 0.0–0.7)
EOS PCT: 6 %
HCT: 26.8 % — ABNORMAL LOW (ref 39.0–52.0)
Hemoglobin: 8.2 g/dL — ABNORMAL LOW (ref 13.0–17.0)
LYMPHS ABS: 1 10*3/uL (ref 0.7–4.0)
Lymphocytes Relative: 26 %
MCH: 25.2 pg — AB (ref 26.0–34.0)
MCHC: 30.6 g/dL (ref 30.0–36.0)
MCV: 82.5 fL (ref 78.0–100.0)
MONOS PCT: 11 %
Monocytes Absolute: 0.4 10*3/uL (ref 0.1–1.0)
NEUTROS PCT: 57 %
Neutro Abs: 2.3 10*3/uL (ref 1.7–7.7)
PLATELETS: 146 10*3/uL — AB (ref 150–400)
RBC: 3.25 MIL/uL — ABNORMAL LOW (ref 4.22–5.81)
RDW: 15.9 % — AB (ref 11.5–15.5)
WBC: 4 10*3/uL (ref 4.0–10.5)

## 2015-09-06 LAB — BASIC METABOLIC PANEL
Anion gap: 12 (ref 5–15)
BUN: 66 mg/dL — AB (ref 6–20)
CALCIUM: 5.8 mg/dL — AB (ref 8.9–10.3)
CHLORIDE: 112 mmol/L — AB (ref 101–111)
CO2: 20 mmol/L — AB (ref 22–32)
CREATININE: 11.63 mg/dL — AB (ref 0.61–1.24)
GFR calc non Af Amer: 4 mL/min — ABNORMAL LOW (ref >60)
GFR, EST AFRICAN AMERICAN: 5 mL/min — AB (ref >60)
Glucose, Bld: 113 mg/dL — ABNORMAL HIGH (ref 65–99)
Potassium: 3.3 mmol/L — ABNORMAL LOW (ref 3.5–5.1)
SODIUM: 144 mmol/L (ref 135–145)

## 2015-09-06 LAB — TROPONIN I
Troponin I: <0.03 ng/mL (ref <0.031)
Troponin I: <0.03 ng/mL (ref <0.031)

## 2015-09-06 LAB — CK: Total CK: 215 U/L (ref 49–397)

## 2015-09-06 LAB — PHOSPHORUS: PHOSPHORUS: 5.2 mg/dL — AB (ref 2.5–4.6)

## 2015-09-06 LAB — MAGNESIUM: MAGNESIUM: 1.9 mg/dL (ref 1.7–2.4)

## 2015-09-06 MED ORDER — METHOCARBAMOL 500 MG PO TABS: 500.0000 mg | ORAL_TABLET | Freq: Two times a day (BID) | ORAL | Status: DC

## 2015-09-06 NOTE — ED Notes (Signed)
Pt c/o increased muscle cramping. Kathrynn Humble, MD notified.

## 2015-09-06 NOTE — Consult Note (Signed)
   Patient name: [name] MRN: [id number] DOB: [birth year] Sex: male  REASON FOR COSULT: Cramping in the left hand. Consult is from the emergency department.  HPI: [name] is a 55 y.o. male who underwent a left brachiocephalic AV fistula placement on 08/17/2015 by Dr. Adele Barthel. He began having some cramping pain and presents to the emergency department. He describes cramping in the left hand but also states that he gets it in the right hand and also in his lower extremities. I do not get any history of rest pain in the left hand. His symptoms of cramping began several hours ago.  No current facility-administered medications for this encounter.   Current Outpatient Prescriptions  Medication Sig Dispense Refill  . acetaminophen (TYLENOL) 325 MG tablet Take 2 tablets (650 mg total) by mouth every 6 (six) hours as needed for mild pain (or Fever >/= 101). (Patient taking differently: Take 650 mg by mouth daily as needed for mild pain (or Fever >/= 101). )    . amLODipine (NORVASC) 10 MG tablet Take 10 mg by mouth every morning. High Blood prussure    . carvedilol (COREG) 12.5 MG tablet Take 12.5 mg by mouth daily.   4  . diazepam (VALIUM) 5 MG tablet Take 1 tablet (5 mg total) by mouth every 8 (eight) hours as needed for anxiety. (Patient taking differently: Take 5 mg by mouth daily as needed for anxiety. ) 15 tablet 0  . VIAGRA 100 MG tablet Take 100 mg by mouth daily as needed for erectile dysfunction.   11    REVIEW OF SYSTEMS:  [X]  denotes positive finding, [ ]  denotes negative finding Cardiac  Comments:  Chest pain or chest pressure:    Shortness of breath upon exertion:    Short of breath when lying flat:    Irregular heart rhythm:    Constitutional    Fever or chills:      PHYSICAL EXAM: Filed Vitals:   09/06/15 0500 09/06/15 0515 09/06/15 0530 09/06/15 0545  BP: 137/97 123/73 102/87 126/69  Pulse: 82 73 69 74  Temp:      TempSrc:      Resp: 21 18 13 19   Height:       Weight:      SpO2: 89% 91% 95% 96%    GENERAL: The patient is a well-nourished male, in no acute distress. The vital signs are documented above. CARDIOVASCULAR: There is a regular rate and rhythm. PULMONARY: There is good air exchange bilaterally without wheezing or rales. He has an excellent thrill in his left brachiocephalic AV fistula. Incision has healed nicely. He has a palpable left radial pulse which is normal. He has a biphasic radial, ulnar, and palmaris arch signal with the Doppler.  MEDICAL ISSUES:  STATUS POST LEFT BRACHIOCEPHALIC AV FISTULA: Given these having cramping in both hands and also both lower extremity's I do not think his symptoms are secondary to steal. He has a normal radial pulse on the left and biphasic Doppler signals in the radial, ulnar, and palmar arch signals. He will keep his regularly scheduled follow up appointment with Dr. Adele Barthel.  Deitra Mayo [facility]: [phone]

## 2015-09-06 NOTE — Discharge Instructions (Signed)
We saw you in the ER for the cramping All the results in the ER are normal, labs and imaging. We are not sure what is causing your symptoms. The workup in the ER is not complete, and is limited to screening for life threatening and emergent conditions only, so please see a primary care doctor for further evaluation.    Muscle Cramps and Spasms Muscle cramps and spasms occur when a muscle or muscles tighten and you have no control over this tightening (involuntary muscle contraction). They are a common problem and can develop in any muscle. The most common place is in the calf muscles of the leg. Both muscle cramps and muscle spasms are involuntary muscle contractions, but they also have differences:   Muscle cramps are sporadic and painful. They may last a few seconds to a quarter of an hour. Muscle cramps are often more forceful and last longer than muscle spasms.  Muscle spasms may or may not be painful. They may also last just a few seconds or much longer. CAUSES  It is uncommon for cramps or spasms to be due to a serious underlying problem. In many cases, the cause of cramps or spasms is unknown. Some common causes are:   Overexertion.   Overuse from repetitive motions (doing the same thing over and over).   Remaining in a certain position for a long period of time.   Improper preparation, form, or technique while performing a sport or activity.   Dehydration.   Injury.   Side effects of some medicines.   Abnormally low levels of the salts and ions in your blood (electrolytes), especially potassium and calcium. This could happen if you are taking water pills (diuretics) or you are pregnant.  Some underlying medical problems can make it more likely to develop cramps or spasms. These include, but are not limited to:   Diabetes.   Parkinson disease.   Hormone disorders, such as thyroid problems.   Alcohol abuse.   Diseases specific to muscles, joints, and bones.    Blood vessel disease where not enough blood is getting to the muscles.  HOME CARE INSTRUCTIONS   Stay well hydrated. Drink enough water and fluids to keep your urine clear or pale yellow.  It may be helpful to massage, stretch, and relax the affected muscle.  For tight or tense muscles, use a warm towel, heating pad, or hot shower water directed to the affected area.  If you are sore or have pain after a cramp or spasm, applying ice to the affected area may relieve discomfort.  Put ice in a plastic bag.  Place a towel between your skin and the bag.  Leave the ice on for 15-20 minutes, 03-04 times a day.  Medicines used to treat a known cause of cramps or spasms may help reduce their frequency or severity. Only take over-the-counter or prescription medicines as directed by your caregiver. SEEK MEDICAL CARE IF:  Your cramps or spasms get more severe, more frequent, or do not improve over time.  MAKE SURE YOU:   Understand these instructions.  Will watch your condition.  Will get help right away if you are not doing well or get worse.   This information is not intended to replace advice given to you by your health care provider. Make sure you discuss any questions you have with your health care provider.   Document Released: 11/25/2001 Document Revised: 09/30/2012 Document Reviewed: 05/22/2012 Elsevier Interactive Patient Education 2016 Else[employer]  Spasticity Spasticity  is a condition in which certain muscles contract continuously. This causes stiffness or tightness of the muscles. It may interfere with movement, speech, and manner of walking. CAUSES  This condition is usually caused by damage to the portion of the brain or spinal cord that controls voluntary movement. It may occur in association with:  Spinal cord injury.  Multiple sclerosis.  Cerebral palsy.  Brain damage due to lack of oxygen.  Brain trauma.  Severe head injury.  Metabolic diseases such  as:  Adrenoleukodystrophy.  ALS [place] Cerise Gehrig's disease).  Phenylketonuria. SYMPTOMS   Increased muscle tone (hypertonicity).  A series of rapid muscle contractions (clonus).  Exaggerated deep tendon reflexes.  Muscle spasms.  Involuntary crossing of the legs (scissoring).  Fixed joints. The degree of spasticity varies. It ranges from mild muscle stiffness to severe, painful, and uncontrollable muscle spasms. It can interfere with rehabilitation in patients with certain disorders. It often interferes with daily activities. TREATMENT  Treatment may include:  Medications.  Physical therapy regimens. They may include muscle stretching and range of motion exercises. These help prevent shrinkage or shortening of muscles. They also help reduce the severity of symptoms.  Surgery. This may be recommended for tendon release or to sever the nerve-muscle pathway. PROGNOSIS  The outcome for those with spasticity depends on:  Severity of the spasticity.  Associated disorder(s).   This information is not intended to replace advice given to you by your health care provider. Make sure you discuss any questions you have with your health care provider.   Document Released: 05/26/2002 Document Revised: 08/28/2011 Document Reviewed: 08/19/2013 Elsevier Interactive Patient Education [employer].

## 2015-09-08 ENCOUNTER — Telehealth: Payer: Self-pay | Admitting: Vascular Surgery

## 2015-09-08 ENCOUNTER — Encounter: Payer: Self-pay | Admitting: Vascular Surgery

## 2015-09-08 NOTE — Telephone Encounter (Signed)
-----   Message from Mena Goes, RN sent at 09/06/2015  9:23 AM EDT ----- Regarding: schedule   ----- Message -----    From: Angelia Mould, MD    Sent: 09/06/2015   6:25 AM      To: [facility] Charge Pool Subject: charge                                         Level 3 consult. He should have a follow up appointment with Dr. Adele Barthel within the next 2 weeks. He was having cramping in the left hand but also in both hands and both legs. I did not think that he had steal symptoms. However he does need follow up. Thank you. CD

## 2015-09-08 NOTE — Telephone Encounter (Signed)
VM is full, mailed letter, dpm

## 2015-09-16 ENCOUNTER — Ambulatory Visit: Payer: 59 | Admitting: Vascular Surgery

## 2015-09-29 ENCOUNTER — Encounter: Payer: Self-pay | Admitting: Vascular Surgery

## 2015-10-01 ENCOUNTER — Emergency Department (HOSPITAL_COMMUNITY): Payer: Commercial Managed Care - HMO

## 2015-10-01 ENCOUNTER — Inpatient Hospital Stay (HOSPITAL_COMMUNITY)
Admission: EM | Admit: 2015-10-01 | Discharge: 2015-10-09 | DRG: 682 | Disposition: A | Discharge status: Home / Self Care | Payer: Commercial Managed Care - HMO | Attending: Internal Medicine | Admitting: Internal Medicine

## 2015-10-01 ENCOUNTER — Encounter (HOSPITAL_COMMUNITY): Payer: Self-pay | Admitting: Emergency Medicine

## 2015-10-01 ENCOUNTER — Observation Stay (HOSPITAL_COMMUNITY): Payer: Commercial Managed Care - HMO

## 2015-10-01 DIAGNOSIS — R7303 Prediabetes: Secondary | ICD-10-CM | POA: Diagnosis present

## 2015-10-01 DIAGNOSIS — I10 Essential (primary) hypertension: Secondary | ICD-10-CM | POA: Diagnosis present

## 2015-10-01 DIAGNOSIS — N186 End stage renal disease: Secondary | ICD-10-CM | POA: Diagnosis present

## 2015-10-01 DIAGNOSIS — M25521 Pain in right elbow: Secondary | ICD-10-CM

## 2015-10-01 DIAGNOSIS — Z9079 Acquired absence of other genital organ(s): Secondary | ICD-10-CM

## 2015-10-01 DIAGNOSIS — M79603 Pain in arm, unspecified: Secondary | ICD-10-CM

## 2015-10-01 DIAGNOSIS — Z880 Allergy status to penicillin: Secondary | ICD-10-CM

## 2015-10-01 DIAGNOSIS — Z8546 Personal history of malignant neoplasm of prostate: Secondary | ICD-10-CM

## 2015-10-01 DIAGNOSIS — G4733 Obstructive sleep apnea (adult) (pediatric): Secondary | ICD-10-CM | POA: Diagnosis present

## 2015-10-01 DIAGNOSIS — C61 Malignant neoplasm of prostate: Secondary | ICD-10-CM | POA: Diagnosis present

## 2015-10-01 DIAGNOSIS — Z9119 Patient's noncompliance with other medical treatment and regimen: Secondary | ICD-10-CM

## 2015-10-01 DIAGNOSIS — R52 Pain, unspecified: Secondary | ICD-10-CM

## 2015-10-01 DIAGNOSIS — R042 Hemoptysis: Secondary | ICD-10-CM | POA: Diagnosis not present

## 2015-10-01 DIAGNOSIS — N189 Chronic kidney disease, unspecified: Secondary | ICD-10-CM

## 2015-10-01 DIAGNOSIS — R509 Fever, unspecified: Secondary | ICD-10-CM

## 2015-10-01 DIAGNOSIS — R531 Weakness: Secondary | ICD-10-CM

## 2015-10-01 DIAGNOSIS — E785 Hyperlipidemia, unspecified: Secondary | ICD-10-CM | POA: Diagnosis present

## 2015-10-01 DIAGNOSIS — I471 Supraventricular tachycardia: Secondary | ICD-10-CM | POA: Diagnosis not present

## 2015-10-01 DIAGNOSIS — Z6836 Body mass index (BMI) 36.0-36.9, adult: Secondary | ICD-10-CM

## 2015-10-01 DIAGNOSIS — N179 Acute kidney failure, unspecified: Secondary | ICD-10-CM

## 2015-10-01 DIAGNOSIS — I82619 Acute embolism and thrombosis of superficial veins of unspecified upper extremity: Secondary | ICD-10-CM | POA: Diagnosis present

## 2015-10-01 DIAGNOSIS — G8929 Other chronic pain: Secondary | ICD-10-CM

## 2015-10-01 DIAGNOSIS — M109 Gout, unspecified: Secondary | ICD-10-CM | POA: Diagnosis present

## 2015-10-01 DIAGNOSIS — R05 Cough: Secondary | ICD-10-CM

## 2015-10-01 DIAGNOSIS — R634 Abnormal weight loss: Secondary | ICD-10-CM | POA: Diagnosis present

## 2015-10-01 DIAGNOSIS — I120 Hypertensive chronic kidney disease with stage 5 chronic kidney disease or end stage renal disease: Secondary | ICD-10-CM | POA: Diagnosis not present

## 2015-10-01 DIAGNOSIS — R609 Edema, unspecified: Secondary | ICD-10-CM

## 2015-10-01 DIAGNOSIS — N183 Chronic kidney disease, stage 3 (moderate): Secondary | ICD-10-CM

## 2015-10-01 DIAGNOSIS — R059 Cough, unspecified: Secondary | ICD-10-CM

## 2015-10-01 DIAGNOSIS — D631 Anemia in chronic kidney disease: Secondary | ICD-10-CM | POA: Diagnosis present

## 2015-10-01 DIAGNOSIS — D649 Anemia, unspecified: Secondary | ICD-10-CM

## 2015-10-01 HISTORY — DX: Malignant (primary) neoplasm, unspecified: C80.1

## 2015-10-01 HISTORY — DX: Anemia, unspecified: D64.9

## 2015-10-01 LAB — URINALYSIS, ROUTINE W REFLEX MICROSCOPIC
BILIRUBIN URINE: NEGATIVE
GLUCOSE, UA: NEGATIVE mg/dL
Ketones, ur: NEGATIVE mg/dL
LEUKOCYTES UA: NEGATIVE
NITRITE: NEGATIVE
Protein, ur: 100 mg/dL — AB
SPECIFIC GRAVITY, URINE: 1.01 (ref 1.005–1.030)
pH: 6.5 (ref 5.0–8.0)

## 2015-10-01 LAB — CREATININE, SERUM
CREATININE: 16.47 mg/dL — AB (ref 0.61–1.24)
GFR, EST AFRICAN AMERICAN: 3 mL/min — AB (ref >60)
GFR, EST NON AFRICAN AMERICAN: 3 mL/min — AB (ref >60)

## 2015-10-01 LAB — URINE MICROSCOPIC-ADD ON

## 2015-10-01 LAB — BASIC METABOLIC PANEL
Anion gap: 17 — ABNORMAL HIGH (ref 5–15)
BUN: 85 mg/dL — AB (ref 6–20)
CHLORIDE: 105 mmol/L (ref 101–111)
CO2: 19 mmol/L — AB (ref 22–32)
Calcium: 7 mg/dL — ABNORMAL LOW (ref 8.9–10.3)
Creatinine, Ser: 16.9 mg/dL — ABNORMAL HIGH (ref 0.61–1.24)
GFR calc Af Amer: 3 mL/min — ABNORMAL LOW (ref >60)
GFR, EST NON AFRICAN AMERICAN: 3 mL/min — AB (ref >60)
GLUCOSE: 103 mg/dL — AB (ref 65–99)
POTASSIUM: 3.8 mmol/L (ref 3.5–5.1)
Sodium: 141 mmol/L (ref 135–145)

## 2015-10-01 LAB — CBC
HCT: 24.9 % — ABNORMAL LOW (ref 39.0–52.0)
HEMOGLOBIN: 7.7 g/dL — AB (ref 13.0–17.0)
MCH: 25.3 pg — ABNORMAL LOW (ref 26.0–34.0)
MCHC: 30.9 g/dL (ref 30.0–36.0)
MCV: 81.9 fL (ref 78.0–100.0)
Platelets: 190 10*3/uL (ref 150–400)
RBC: 3.04 MIL/uL — AB (ref 4.22–5.81)
RDW: 15.4 % (ref 11.5–15.5)
WBC: 5.7 10*3/uL (ref 4.0–10.5)

## 2015-10-01 LAB — PHOSPHORUS: Phosphorus: 7 mg/dL — ABNORMAL HIGH (ref 2.5–4.6)

## 2015-10-01 LAB — CBC WITH DIFFERENTIAL/PLATELET
Basophils Absolute: 0 10*3/uL (ref 0.0–0.1)
Basophils Relative: 0 %
EOS PCT: 4 %
Eosinophils Absolute: 0.3 10*3/uL (ref 0.0–0.7)
HEMATOCRIT: 25.9 % — AB (ref 39.0–52.0)
Hemoglobin: 8.2 g/dL — ABNORMAL LOW (ref 13.0–17.0)
LYMPHS ABS: 1.6 10*3/uL (ref 0.7–4.0)
LYMPHS PCT: 26 %
MCH: 25.9 pg — AB (ref 26.0–34.0)
MCHC: 31.7 g/dL (ref 30.0–36.0)
MCV: 82 fL (ref 78.0–100.0)
Monocytes Absolute: 0.8 10*3/uL (ref 0.1–1.0)
Monocytes Relative: 13 %
Neutro Abs: 3.3 10*3/uL (ref 1.7–7.7)
Neutrophils Relative %: 57 %
PLATELETS: 206 10*3/uL (ref 150–400)
RBC: 3.16 MIL/uL — AB (ref 4.22–5.81)
RDW: 15.4 % (ref 11.5–15.5)
WBC: 5.9 10*3/uL (ref 4.0–10.5)

## 2015-10-01 LAB — FERRITIN: Ferritin: 133 ng/mL (ref 24–336)

## 2015-10-01 LAB — RETICULOCYTES
RBC.: 3.04 MIL/uL — AB (ref 4.22–5.81)
RETIC COUNT ABSOLUTE: 27.4 10*3/uL (ref 19.0–186.0)
Retic Ct Pct: 0.9 % (ref 0.4–3.1)

## 2015-10-01 LAB — HEMOGLOBIN AND HEMATOCRIT, BLOOD
HCT: 24.7 % — ABNORMAL LOW (ref 39.0–52.0)
HCT: 24.3 % — ABNORMAL LOW (ref 39.0–52.0)
Hemoglobin: 8 g/dL — ABNORMAL LOW (ref 13.0–17.0)
Hemoglobin: 7.6 g/dL — ABNORMAL LOW (ref 13.0–17.0)

## 2015-10-01 LAB — FOLATE: FOLATE: 8 ng/mL (ref >5.9)

## 2015-10-01 LAB — PROTIME-INR
INR: 1.14 (ref 0.00–1.49)
Prothrombin Time: 14.8 seconds (ref 11.6–15.2)

## 2015-10-01 LAB — TYPE AND SCREEN
ABO/RH(D): O POS
Antibody Screen: NEGATIVE

## 2015-10-01 LAB — VITAMIN B12: VITAMIN B 12: 5685 pg/mL — AB (ref 180–914)

## 2015-10-01 LAB — MAGNESIUM: MAGNESIUM: 2.2 mg/dL (ref 1.7–2.4)

## 2015-10-01 LAB — IRON AND TIBC
IRON: 23 ug/dL — AB (ref 45–182)
Saturation Ratios: 10 % — ABNORMAL LOW (ref 17.9–39.5)
TIBC: 220 ug/dL — ABNORMAL LOW (ref 250–450)
UIBC: 197 ug/dL

## 2015-10-01 LAB — ALBUMIN: ALBUMIN: 3.1 g/dL — AB (ref 3.5–5.0)

## 2015-10-01 MED ORDER — HEPARIN SODIUM (PORCINE) 5000 UNIT/ML IJ SOLN: 5000.0000 [IU] | Freq: Three times a day (TID) | INTRAMUSCULAR | Status: DC

## 2015-10-01 MED ORDER — CARVEDILOL 12.5 MG PO TABS: 12.5000 mg | ORAL_TABLET | Freq: Every day | ORAL | Status: DC

## 2015-10-01 MED ORDER — ACETAMINOPHEN 650 MG RE SUPP
650.0000 mg | Freq: Four times a day (QID) | RECTAL | Status: DC | PRN
Start: 2015-10-01 — End: 2015-10-09

## 2015-10-01 MED ORDER — LIDOCAINE HCL (PF) 1 % IJ SOLN: 5.0000 mL | INTRAMUSCULAR | Status: DC | PRN

## 2015-10-01 MED ORDER — HEPARIN SODIUM (PORCINE) 1000 UNIT/ML DIALYSIS
20.0000 [IU]/kg | INTRAMUSCULAR | Status: DC | PRN
  Filled 2015-10-01: qty 3

## 2015-10-01 MED ORDER — SODIUM CHLORIDE 0.9 % IV SOLN: 100.0000 mL | INTRAVENOUS | Status: DC | PRN

## 2015-10-01 MED ORDER — SODIUM CHLORIDE 0.9 % IV SOLN
INTRAVENOUS | Status: DC
  Administered 2015-10-01: 10:00:00 via INTRAVENOUS

## 2015-10-01 MED ORDER — PENTAFLUOROPROP-TETRAFLUOROETH EX AERO: 1.0000 "application " | INHALATION_SPRAY | CUTANEOUS | Status: DC | PRN

## 2015-10-01 MED ORDER — CARVEDILOL 12.5 MG PO TABS
12.5000 mg | ORAL_TABLET | Freq: Every day | ORAL | Status: DC
  Administered 2015-10-01 – 2015-10-09 (×9): 12.5 mg via ORAL
  Filled 2015-10-01 (×9): qty 1

## 2015-10-01 MED ORDER — LIDOCAINE-PRILOCAINE 2.5-2.5 % EX CREA
1.0000 "application " | TOPICAL_CREAM | CUTANEOUS | Status: DC | PRN
  Filled 2015-10-01: qty 5

## 2015-10-01 MED ORDER — POLYETHYLENE GLYCOL 3350 17 G PO PACK
17.0000 g | PACK | Freq: Every day | ORAL | Status: DC | PRN
  Administered 2015-10-08: 17 g via ORAL
  Filled 2015-10-01: qty 1

## 2015-10-01 MED ORDER — DOXYCYCLINE HYCLATE 100 MG PO TABS
100.0000 mg | ORAL_TABLET | Freq: Two times a day (BID) | ORAL | Status: DC
  Administered 2015-10-01 – 2015-10-07 (×13): 100 mg via ORAL
  Filled 2015-10-01 (×13): qty 1

## 2015-10-01 MED ORDER — HEPARIN SODIUM (PORCINE) 1000 UNIT/ML DIALYSIS
1000.0000 [IU] | INTRAMUSCULAR | Status: DC | PRN
  Filled 2015-10-01: qty 1

## 2015-10-01 MED ORDER — AMLODIPINE BESYLATE 10 MG PO TABS
10.0000 mg | ORAL_TABLET | Freq: Every morning | ORAL | Status: DC
  Administered 2015-10-01: 10 mg via ORAL
  Filled 2015-10-01 (×2): qty 1

## 2015-10-01 MED ORDER — ACETAMINOPHEN 325 MG PO TABS
650.0000 mg | ORAL_TABLET | Freq: Four times a day (QID) | ORAL | Status: DC | PRN
  Administered 2015-10-05 – 2015-10-07 (×3): 650 mg via ORAL
  Filled 2015-10-01 (×3): qty 2

## 2015-10-01 MED ORDER — DIAZEPAM 5 MG PO TABS
5.0000 mg | ORAL_TABLET | Freq: Every day | ORAL | Status: DC | PRN
  Administered 2015-10-01 – 2015-10-08 (×6): 5 mg via ORAL
  Filled 2015-10-01 (×7): qty 1

## 2015-10-01 MED ORDER — ALTEPLASE 2 MG IJ SOLR: 2.0000 mg | Freq: Once | INTRAMUSCULAR | Status: DC | PRN

## 2015-10-01 MED ORDER — MORPHINE SULFATE (PF) 2 MG/ML IV SOLN
1.0000 mg | INTRAVENOUS | Status: DC | PRN
  Administered 2015-10-02 – 2015-10-07 (×4): 1 mg via INTRAVENOUS
  Filled 2015-10-01 (×3): qty 1

## 2015-10-01 MED ORDER — HYDRALAZINE HCL 20 MG/ML IJ SOLN
10.0000 mg | Freq: Three times a day (TID) | INTRAMUSCULAR | Status: DC | PRN
  Administered 2015-10-07: 10 mg via INTRAVENOUS
  Filled 2015-10-01: qty 1

## 2015-10-01 MED ORDER — AMLODIPINE BESYLATE 10 MG PO TABS: 10.0000 mg | ORAL_TABLET | Freq: Every morning | ORAL | Status: DC

## 2015-10-01 NOTE — Consult Note (Signed)
[facility] Renal Consultation Note  Requesting MD: Tamala Julian Indication for Consultation: advanced CKD- now likely ESRD  HPI:  [name] is a 56 y.o. male with past medical history significant for hypertension, OSA, borderline diabetes mellitus, hyperlipidemia, gout, also status post prostatectomy. He is known to have progressive renal insufficiency followed by Dr. Florene Glen at [facility]. Creatinine most recently has been in the fours. However, there were labs done on March 20 revealing a creatinine of 11.6. Patient has been hesitant to participate in any predialysis planning. However, he did get an AV fistula placed in his left upper arm on February 28. He now presents to the hospital mostly because he had an episode of coughing up some blood. However, upon further investigation patient states that he's been very fatigued, has very little appetite, has had a weight loss and is nauseated in the mornings as well as itching. Labs showed creatinine of 16 with a BUN of 85.  I've explained to him that mostly what  he is describing are uremic symptoms and tells me that it's time to initiate dialysis therapy. He is understandably upset to hear this news but is okay with it   CREATININE, SER  Date/Time Value Ref Range Status  10/01/2015 09:31 AM 16.47* 0.61 - 1.24 mg/dL Final  10/01/2015 04:18 AM 16.90* 0.61 - 1.24 mg/dL Final  09/06/2015 01:00 AM 11.63* 0.61 - 1.24 mg/dL Final  11/01/2014 08:20 PM 3.97* 0.61 - 1.24 mg/dL Final  09/24/2014 06:16 AM 3.30* 0.50 - 1.35 mg/dL Final  09/23/2014 05:28 AM 3.64* 0.50 - 1.35 mg/dL Final  09/22/2014 05:50 AM 4.18* 0.50 - 1.35 mg/dL Final  09/21/2014 01:35 PM 4.71* 0.50 - 1.35 mg/dL Final  09/19/2014 02:49 AM 3.48* 0.50 - 1.35 mg/dL Final  01/10/2014 04:11 PM 2.16* 0.50 - 1.35 mg/dL Final  12/21/2013 04:35 AM 2.52* 0.50 - 1.35 mg/dL Final  12/20/2013 12:06 PM 2.20* 0.50 - 1.35 mg/dL Final  12/19/2013 04:50 AM 2.19* 0.50 - 1.35 mg/dL  Final  12/10/2013 10:20 AM 1.98* 0.50 - 1.35 mg/dL Final  11/04/2013 04:41 AM 2.03* 0.50 - 1.35 mg/dL Final  10/27/2013 12:31 PM 2.10* 0.50 - 1.35 mg/dL Final  06/01/2013 10:18 PM 1.70* 0.50 - 1.35 mg/dL Final  05/31/2013 06:23 AM 1.49* 0.50 - 1.35 mg/dL Final  05/30/2013 10:17 AM 1.48* 0.50 - 1.35 mg/dL Final  05/30/2013 05:09 AM 1.63* 0.50 - 1.35 mg/dL Final  05/29/2013 10:14 PM 1.77* 0.50 - 1.35 mg/dL Final  07/28/2012 09:43 PM 1.65* 0.50 - 1.35 mg/dL Final  07/25/2012 04:52 AM 1.68* 0.50 - 1.35 mg/dL Final  10/29/2007 06:44 PM 1.7*  Final  12/13/2006 10:08 AM 1.3  Final     PMHx:   Past Medical History  Diagnosis Date  . Hyperlipidemia   . Hypertension   . Gout     STABLE  PER PT 10-22-2013  . BPH (benign prostatic hypertrophy)   . Elevated PSA   . Microhematuria   . Borderline diabetes mellitus   . Nocturia   . Arthritis     SPINE  . Wears glasses   . CKD (chronic kidney disease), stage III   . OSA (obstructive sleep apnea)     PER PT STUDY DONE 2005 (APPROX).  NON- COMPLIANT CPAP  . H/O hiatal hernia   . Diabetes mellitus without complication (HCC)     borderline on no meds   . Cancer ([place])   . Anemia 10/01/2015    Past Surgical History  Procedure Laterality  Date  . Transthoracic echocardiogram  07-03-2006    MILD - MODERATE LVH/  EF 55-60%/  GRADE I DIASTOIC DYSFUNCTION/  TRIVIAL  TR /  TRIVIAL PERICARDIAL EFFUSION  . Right hand surgery  YRS AGO  . Cardiac catheterization  10-04-2006   DR [facility]    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%/  2ndDIAGONAL 30%/  RCA 40%/  EF 60%  . Cardiac catheterization  04-07-2002  DR [facility]    NON-CRITICAL CAD/  PRESERVED LV  . Prostate biopsy N/A 10/27/2013    Procedure: BIOPSY TRANSRECTAL ULTRASONIC PROSTATE (TUBP);  Surgeon: Molli Hazard, MD;  Location: [facility];  Service: Urology;  Laterality: N/A;  . Cystoscopy w/ retrogrades Bilateral 10/27/2013    Procedure: CYSTOSCOPY WITH RETROGRADE PYELOGRAM;   Surgeon: Molli Hazard, MD;  Location: [facility];  Service: Urology;  Laterality: Bilateral;  . Robot assisted laparoscopic radical prostatectomy N/A 12/18/2013    Procedure: ROBOTIC ASSISTED LAPAROSCOPIC RADICAL PROSTATECTOMY AND INDOCYANINE GREEN DYE;  Surgeon: Alexis Frock, MD;  Location: [facility];  Service: Urology;  Laterality: N/A;  . Lymphadenectomy Bilateral 12/18/2013    Procedure: PELVIC LYMPH NODE DISSECTION;  Surgeon: Alexis Frock, MD;  Location: [facility];  Service: Urology;  Laterality: Bilateral;  . Prostate surgery    . Av fistula placement Left 08/17/2015    Procedure: BRACHIOCEPHALIC ARTERIOVENOUS (AV) FISTULA CREATION  left arm;  Surgeon: Conrad Winesburg, MD;  Location: [facility] OR;  Service: Vascular;  Laterality: Left;    Family Hx:  Family History  Problem Relation Age of Onset  . Cirrhosis Father   . Other Mother     shot and killed    Social History:  reports that he has never smoked. He has never used smokeless tobacco. He reports that he does not drink alcohol or use illicit drugs.  Allergies:  Allergies  Allergen Reactions  . Penicillins Nausea And Vomiting and Rash    Has patient had a PCN reaction causing immediate rash, facial/tongue/throat swelling, SOB or lightheadedness with hypotension: Yes    Medications: Prior to Admission medications   Medication Sig Start Date End Date Taking? Authorizing Provider  acetaminophen (TYLENOL) 325 MG tablet Take 2 tablets (650 mg total) by mouth every 6 (six) hours as needed for mild pain (or Fever >/= 101). Patient taking differently: Take 650 mg by mouth daily as needed for mild pain (or Fever >/= 101).  09/24/14  Yes Delfina Redwood, MD  amLODipine (NORVASC) 10 MG tablet Take 10 mg by mouth every morning. High Blood prussure   Yes Historical Provider, MD  carvedilol (COREG) 12.5 MG tablet Take 12.5 mg by mouth daily.  07/12/15  Yes Historical Provider, MD  diazepam (VALIUM) 5 MG tablet Take 1 tablet (5  mg total) by mouth every 8 (eight) hours as needed for anxiety. Patient taking differently: Take 5 mg by mouth daily as needed for anxiety.  11/02/14  Yes Evelina Bucy, MD  VIAGRA 100 MG tablet Take 100 mg by mouth daily as needed for erectile dysfunction.  06/25/15  Yes Historical Provider, MD    I have reviewed the patient's current medications.  Labs:  Results for orders placed or performed during the hospital encounter of 10/01/15 (from the past 48 hour(s))  CBC with Differential     Status: Abnormal   Collection Time: 10/01/15  4:18 AM  Result Value Ref Range   WBC 5.9 4.0 - 10.5 K/uL   RBC 3.16 (L) 4.22 - 5.81 MIL/uL  Hemoglobin 8.2 (L) 13.0 - 17.0 g/dL   HCT 25.9 (L) 39.0 - 52.0 %   MCV 82.0 78.0 - 100.0 fL   MCH 25.9 (L) 26.0 - 34.0 pg   MCHC 31.7 30.0 - 36.0 g/dL   RDW 15.4 11.5 - 15.5 %   Platelets 206 150 - 400 K/uL   Neutrophils Relative % 57 %   Neutro Abs 3.3 1.7 - 7.7 K/uL   Lymphocytes Relative 26 %   Lymphs Abs 1.6 0.7 - 4.0 K/uL   Monocytes Relative 13 %   Monocytes Absolute 0.8 0.1 - 1.0 K/uL   Eosinophils Relative 4 %   Eosinophils Absolute 0.3 0.0 - 0.7 K/uL   Basophils Relative 0 %   Basophils Absolute 0.0 0.0 - 0.1 K/uL  Basic metabolic panel     Status: Abnormal   Collection Time: 10/01/15  4:18 AM  Result Value Ref Range   Sodium 141 135 - 145 mmol/L   Potassium 3.8 3.5 - 5.1 mmol/L   Chloride 105 101 - 111 mmol/L   CO2 19 (L) 22 - 32 mmol/L   Glucose, Bld 103 (H) 65 - 99 mg/dL   BUN 85 (H) 6 - 20 mg/dL   Creatinine, Ser 16.90 (H) 0.61 - 1.24 mg/dL   Calcium 7.0 (L) 8.9 - 10.3 mg/dL   GFR calc non Af Amer 3 (L) >60 mL/min   GFR calc Af Amer 3 (L) >60 mL/min    Comment: (NOTE) The eGFR has been calculated using the CKD EPI equation. This calculation has not been validated in all clinical situations. eGFR's persistently <60 mL/min signify possible Chronic Kidney Disease.    Anion gap 17 (H) 5 - 15  Type and screen     Status: None   Collection  Time: 10/01/15  5:55 AM  Result Value Ref Range   ABO/RH(D) O POS    Antibody Screen NEG    Sample Expiration 10/04/2015   Protime-INR     Status: None   Collection Time: 10/01/15  6:06 AM  Result Value Ref Range   Prothrombin Time 14.8 11.6 - 15.2 seconds   INR 1.14 0.00 - 1.49  Creatinine, serum     Status: Abnormal   Collection Time: 10/01/15  9:31 AM  Result Value Ref Range   Creatinine, Ser 16.47 (H) 0.61 - 1.24 mg/dL   GFR calc non Af Amer 3 (L) >60 mL/min   GFR calc Af Amer 3 (L) >60 mL/min    Comment: (NOTE) The eGFR has been calculated using the CKD EPI equation. This calculation has not been validated in all clinical situations. eGFR's persistently <60 mL/min signify possible Chronic Kidney Disease.   Vitamin B12     Status: Abnormal   Collection Time: 10/01/15  9:31 AM  Result Value Ref Range   Vitamin B-12 5685 (H) 180 - 914 pg/mL    Comment: (NOTE) This assay is not validated for testing neonatal or myeloproliferative syndrome specimens for Vitamin B12 levels.   Folate     Status: None   Collection Time: 10/01/15  9:31 AM  Result Value Ref Range   Folate 8.0 >5.9 ng/mL  Iron and TIBC     Status: Abnormal   Collection Time: 10/01/15  9:31 AM  Result Value Ref Range   Iron 23 (L) 45 - 182 ug/dL   TIBC 220 (L) 250 - 450 ug/dL   Saturation Ratios 10 (L) 17.9 - 39.5 %   UIBC 197 ug/dL  Ferritin  Status: None   Collection Time: 10/01/15  9:31 AM  Result Value Ref Range   Ferritin 133 24 - 336 ng/mL  Reticulocytes     Status: Abnormal   Collection Time: 10/01/15  9:31 AM  Result Value Ref Range   Retic Ct Pct 0.9 0.4 - 3.1 %   RBC. 3.04 (L) 4.22 - 5.81 MIL/uL   Retic Count, Manual 27.4 19.0 - 186.0 K/uL  CBC     Status: Abnormal   Collection Time: 10/01/15  9:31 AM  Result Value Ref Range   WBC 5.7 4.0 - 10.5 K/uL   RBC 3.04 (L) 4.22 - 5.81 MIL/uL   Hemoglobin 7.7 (L) 13.0 - 17.0 g/dL   HCT 24.9 (L) 39.0 - 52.0 %   MCV 81.9 78.0 - 100.0 fL   MCH  25.3 (L) 26.0 - 34.0 pg   MCHC 30.9 30.0 - 36.0 g/dL   RDW 15.4 11.5 - 15.5 %   Platelets 190 150 - 400 K/uL  Magnesium     Status: None   Collection Time: 10/01/15  9:31 AM  Result Value Ref Range   Magnesium 2.2 1.7 - 2.4 mg/dL  Phosphorus     Status: Abnormal   Collection Time: 10/01/15  9:31 AM  Result Value Ref Range   Phosphorus 7.0 (H) 2.5 - 4.6 mg/dL  Albumin     Status: Abnormal   Collection Time: 10/01/15  9:31 AM  Result Value Ref Range   Albumin 3.1 (L) 3.5 - 5.0 g/dL  Hemoglobin and hematocrit, blood     Status: Abnormal   Collection Time: 10/01/15 11:37 AM  Result Value Ref Range   Hemoglobin 8.0 (L) 13.0 - 17.0 g/dL   HCT 24.7 (L) 39.0 - 52.0 %     ROS:  A comprehensive review of systems was negative except for: Constitutional: positive for fatigue and weight loss Respiratory: positive for cough and hemoptysis Gastrointestinal: positive for nausea Itching  Physical Exam: Filed Vitals:   10/01/15 0959 10/01/15 1039  BP:  153/90  Pulse:  78  Temp: 98.8 F (37.1 C) 98.6 F (37 C)  Resp:  20     General: Moderately obese black male who is fatigued lying in bed. He is not any acute distress  HEENT: Pupils are equally round and reactive to light, extra motions are intact, mucous membranes are moist  Neck: There is no JVD  Heart: Regular rate and rhythm without murmur  Lungs: Some upper airway sounds  Abdomen: Soft, nontender, nondistended  Extremities: Trace edema  Skin: Warm and dry  Neuro: Alert and nonfocal He has left upper arm AV fistula with good thrill and bruit was created on 2/28  Assessment/Plan: 55 year old black male with progressive renal insufficiency now with uremic symptoms  1.Renal- advanced and progressive chronic kidney disease.  I do feel that he is at ESRD. I discussed with him that the symptoms he is having uremic in nature and are an indication to initiate dialysis therapy. He is reluctantly agreeable. Given her fistula has not been  in long-term looks good. We'll attempt to use 17-gauge needles for a 2 hour treatment today and plan second treatment for Saturday.  If aVF fails will need PermCath this admission. We'll also look for outpatient dialysis  arrangements  2.Hemoptysis  - with some patchy infiltrate on chest x-ray. This is not something that would normally go along with uremia. Workup per primary team - placed on doxycycline  3. HTN/volume- if anything slightly volume overloaded.  Not sure if this is what is giving him cough. We will run even today but may be attempt some gentle ultrafiltration with dialysis tomorrow. Continue his home doses of Norvasc and Coreg  4. Anemia  - doesn't appear to have been on ESA as outpatient. Hemoglobin 7.7. We'll check iron stores and initiate ESA 5. Bones- we'll check phos and PTH and treat as needed  Thank you for this consultation. We will continue to follow with you  [name] A 10/01/2015, 1:19 PM

## 2015-10-01 NOTE — ED Notes (Signed)
Attempted Report x1.   

## 2015-10-01 NOTE — ED Notes (Signed)
Pt. reports productive cough with bloody phlegm and  chest congestion for several days , denies fever or chills .

## 2015-10-01 NOTE — ED Notes (Signed)
Pt being transferred upstairs by Verline Lema, EMT at this time.

## 2015-10-01 NOTE — Progress Notes (Signed)
Carryover patient from Dr. Claudine Mouton [name] is a 55 year old male with a past medical history of HTN, gout, BPH, HLD, CKD, diet controlled DM; who presents with complaints of hemoptysis. Chest x-ray showing patchy opacities question infiltrate versus hemorrhage. Initial hemoglobin 8.2. Critical care consulted but recommended admission to hospitalist service as hemodynamically stable. Will likely need pulmonary consultation for possible need of bronchoscopy.

## 2015-10-01 NOTE — ED Provider Notes (Signed)
CSN: [id number]     Arrival date & time 10/01/15  0405 History   First MD Initiated Contact with Patient 10/01/15 0541     Chief Complaint  Patient presents with  . Cough  . Hemoptysis     (Consider location/radiation/quality/duration/timing/severity/associated sxs/prior Treatment) HPI   [name] is a 55 y.o. male with no significant past medical history presenting today for hemostasis. Patient states for the past 5 days he has had a worsening productive cough, particularly at night. He has been coughing up bright red blood. He denies ever doing this in the past. He denies any use of blood thinners. Patient has not had any fevers or chills. He has otherwise been in his normal state of health. He is not very weak and tired recently. There are no further complaints. Nothing has made his symptoms better or worse.  10 Systems reviewed and are negative for acute change except as noted in the HPI.     Past Medical History  Diagnosis Date  . Hyperlipidemia   . Hypertension   . Gout     STABLE  PER PT 10-22-2013  . BPH (benign prostatic hypertrophy)   . Elevated PSA   . Microhematuria   . Borderline diabetes mellitus   . Nocturia   . Arthritis     SPINE  . Wears glasses   . CKD (chronic kidney disease), stage III   . OSA (obstructive sleep apnea)     PER PT STUDY DONE 2005 (APPROX).  NON- COMPLIANT CPAP  . H/O hiatal hernia   . Diabetes mellitus without complication (HCC)     borderline on no meds   . Cancer [facility])    Past Surgical History  Procedure Laterality Date  . Transthoracic echocardiogram  07-03-2006    MILD - MODERATE LVH/  EF 55-60%/  GRADE I DIASTOIC DYSFUNCTION/  TRIVIAL  TR /  TRIVIAL PERICARDIAL EFFUSION  . Right hand surgery  YRS AGO  . Cardiac catheterization  10-04-2006   DR [facility]    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%/  2ndDIAGONAL 30%/  RCA 40%/  EF 60%  . Cardiac catheterization  04-07-2002  DR [facility]    NON-CRITICAL CAD/  PRESERVED LV  . Prostate  biopsy N/A 10/27/2013    Procedure: BIOPSY TRANSRECTAL ULTRASONIC PROSTATE (TUBP);  Surgeon: Molli Hazard, MD;  Location: [facility];  Service: Urology;  Laterality: N/A;  . Cystoscopy w/ retrogrades Bilateral 10/27/2013    Procedure: CYSTOSCOPY WITH RETROGRADE PYELOGRAM;  Surgeon: Molli Hazard, MD;  Location: [facility];  Service: Urology;  Laterality: Bilateral;  . Robot assisted laparoscopic radical prostatectomy N/A 12/18/2013    Procedure: ROBOTIC ASSISTED LAPAROSCOPIC RADICAL PROSTATECTOMY AND INDOCYANINE GREEN DYE;  Surgeon: Alexis Frock, MD;  Location: [facility];  Service: Urology;  Laterality: N/A;  . Lymphadenectomy Bilateral 12/18/2013    Procedure: PELVIC LYMPH NODE DISSECTION;  Surgeon: Alexis Frock, MD;  Location: [facility];  Service: Urology;  Laterality: Bilateral;  . Prostate surgery    . Av fistula placement Left 08/17/2015    Procedure: BRACHIOCEPHALIC ARTERIOVENOUS (AV) FISTULA CREATION  left arm;  Surgeon: Conrad Oak, MD;  Location: [facility];  Service: Vascular;  Laterality: Left;   Family History  Problem Relation Age of Onset  . Cirrhosis Father   . Other Mother     shot and killed   Social History  Substance Use Topics  . Smoking status: Never Smoker   . Smokeless tobacco: Never Used  .  Alcohol Use: No    Review of Systems    Allergies  Penicillins  Home Medications   Prior to Admission medications   Medication Sig Start Date End Date Taking? Authorizing Provider  acetaminophen (TYLENOL) 325 MG tablet Take 2 tablets (650 mg total) by mouth every 6 (six) hours as needed for mild pain (or Fever >/= 101). Patient taking differently: Take 650 mg by mouth daily as needed for mild pain (or Fever >/= 101).  09/24/14   Delfina Redwood, MD  amLODipine (NORVASC) 10 MG tablet Take 10 mg by mouth every morning. High Blood prussure    Historical Provider, MD  carvedilol (COREG) 12.5 MG tablet Take 12.5 mg by mouth daily.   07/12/15   Historical Provider, MD  diazepam (VALIUM) 5 MG tablet Take 1 tablet (5 mg total) by mouth every 8 (eight) hours as needed for anxiety. Patient taking differently: Take 5 mg by mouth daily as needed for anxiety.  11/02/14   Evelina Bucy, MD  methocarbamol (ROBAXIN) 500 MG tablet Take 1 tablet (500 mg total) by mouth 2 (two) times daily. 09/06/15   Varney Biles, MD  VIAGRA 100 MG tablet Take 100 mg by mouth daily as needed for erectile dysfunction.  06/25/15   Historical Provider, MD   BP 148/85 mmHg  Pulse 81  Temp(Src) 98.6 F (37 C) (Oral)  Resp 16  Ht 5\' 11"  (1.803 m)  Wt 270 lb (122.471 kg)  BMI 37.67 kg/m2  SpO2 94% Physical Exam  Constitutional: He is oriented to person, place, and time. Vital signs are normal. He appears well-developed and well-nourished.  Non-toxic appearance. He does not appear ill. No distress.  HENT:  Head: Normocephalic and atraumatic.  Nose: Nose normal.  Mouth/Throat: Oropharynx is clear and moist. No oropharyngeal exudate.  Eyes: Conjunctivae and EOM are normal. Pupils are equal, round, and reactive to light. No scleral icterus.  Neck: Normal range of motion. Neck supple. No tracheal deviation, no edema, no erythema and normal range of motion present. No thyroid mass and no thyromegaly present.  Cardiovascular: Normal rate, regular rhythm, S1 normal, S2 normal, normal heart sounds, intact distal pulses and normal pulses.  Exam reveals no gallop and no friction rub.   No murmur heard. Pulmonary/Chest: Effort normal and breath sounds normal. No respiratory distress. He has no wheezes. He has no rhonchi. He has no rales.  Rhonchorous breath sounds bilaterally  Abdominal: Soft. Normal appearance and bowel sounds are normal. He exhibits no distension, no ascites and no mass. There is no hepatosplenomegaly. There is no tenderness. There is no rebound, no guarding and no CVA tenderness.  Musculoskeletal: Normal range of motion. He exhibits no edema or  tenderness.  Lymphadenopathy:    He has no cervical adenopathy.  Neurological: He is alert and oriented to person, place, and time. He has normal strength. No cranial nerve deficit or sensory deficit.  Skin: Skin is warm, dry and intact. No petechiae and no rash noted. He is not diaphoretic. No erythema. No pallor.  Psychiatric: He has a normal mood and affect. His behavior is normal. Judgment normal.  Nursing note and vitals reviewed.   ED Course  Procedures (including critical care time) Labs Review Labs Reviewed  CBC WITH DIFFERENTIAL/PLATELET - Abnormal; Notable for the following:    RBC 3.16 (*)    Hemoglobin 8.2 (*)    HCT 25.9 (*)    MCH 25.9 (*)    All other components within normal limits  BASIC METABOLIC  PANEL - Abnormal; Notable for the following:    CO2 19 (*)    Glucose, Bld 103 (*)    BUN 85 (*)    Creatinine, Ser 16.90 (*)    Calcium 7.0 (*)    GFR calc non Af Amer 3 (*)    GFR calc Af Amer 3 (*)    Anion gap 17 (*)    All other components within normal limits  PROTIME-INR  TYPE AND SCREEN    Imaging Review Dg Chest 2 View  10/01/2015  CLINICAL DATA:  Hemoptysis for 5 days EXAM: CHEST  2 VIEW COMPARISON:  11/01/2014 FINDINGS: There is patchy opacity in both bases which may represent infectious infiltrate or atelectasis. Pulmonary hemorrhage can also produce this appearance. There is moderate cardiomegaly and aortic tortuosity, unchanged. There are no large effusions. Pulmonary vasculature is normal. IMPRESSION: Patchy opacities in both lung bases, possibly infectious infiltrate versus hemorrhage. Unchanged cardiomegaly. Electronically Signed   By: Andreas Newport M.D.   On: 10/01/2015 04:59   I have personally reviewed and evaluated these images and lab results as part of my medical decision-making.   EKG Interpretation None      MDM   Final diagnoses:  Cough    Patient presents to emergency department for hemostasis and fatigue. Hemoglobin is as low  as it ever been, 8.2. Chest x-ray reveals an infection versus hemorrhage, likely hemorrhage given the history. I cannot obtain a CT scan with IV contrast due to the creatinine. I spoke with Dr. Detterding in the ICU who recommends for stepdown admission. Type and screen was sent, patient currently hemodynamically stable with no oxygen requirement. I will place the triad hospitalist for further care.    Everlene Balls, MD 10/01/15 [phone]

## 2015-10-01 NOTE — H&P (Signed)
[facility] History and Physical  STEEL VAID [id number] DOB: [birth year] DOA: 10/01/2015  Referring physician:  PCP: Myriam Jacobson, MD   Chief Complaint: hemoptysis/generalized weakness  HPI: [name] is a 55 y.o. male the past medical history of chronic kidney disease stage III status post recent placement of AV fistula but not on dialysis, hypertension, sleep apnea noncompliant with sleep apnea, borderline diabetes, anxiety presents to the emergency Department chief complaint hemoptysis. Initial evaluation in the emergency department includes a chest x-ray revealing patchy opacities concerning for possible infection versus hemorrhage and worsening anemia.  Information is obtained from the patient. He states for the last several weeks he has had "a bad taste in my mouth" eminences of the taste of blood. Over the last 5 days he developed gradual worsening of a productive cough. He states it's worse at night. This morning he awakened and he could hear "rattling" in his lungs. When he coughed he noted bright red blood with only a small amount of sputum. Associated symptoms included decreased appetite and a 15 pound unintentional weight loss over the last 4-6 weeks. He denies any blood thinners. He denies fever chills recent travel or sick contacts. Denies any recent incarceration. He denies chest pain palpitations, headache dizziness syncope or near-syncope. He denies lower extremity edema or orthopnea. He denies dysuria hematuria frequency or urgency. He denies abdominal pain nausea vomiting diarrhea constipation melena or bright red blood per rectum.  In the emergency department he is afebrile hemodynamically stable and not hypoxic.  Review of Systems:    Past Medical History  Diagnosis Date  . Hyperlipidemia   . Hypertension   . Gout     STABLE  PER PT 10-22-2013  . BPH (benign prostatic hypertrophy)   . Elevated PSA   . Microhematuria   . Borderline diabetes  mellitus   . Nocturia   . Arthritis     SPINE  . Wears glasses   . CKD (chronic kidney disease), stage III   . OSA (obstructive sleep apnea)     PER PT STUDY DONE 2005 (APPROX).  NON- COMPLIANT CPAP  . H/O hiatal hernia   . Diabetes mellitus without complication (HCC)     borderline on no meds   . Cancer ([place])   . Anemia 10/01/2015   Past Surgical History  Procedure Laterality Date  . Transthoracic echocardiogram  07-03-2006    MILD - MODERATE LVH/  EF 55-60%/  GRADE I DIASTOIC DYSFUNCTION/  TRIVIAL  TR /  TRIVIAL PERICARDIAL EFFUSION  . Right hand surgery  YRS AGO  . Cardiac catheterization  10-04-2006   DR [facility]    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%/  2ndDIAGONAL 30%/  RCA 40%/  EF 60%  . Cardiac catheterization  04-07-2002  DR [facility]    NON-CRITICAL CAD/  PRESERVED LV  . Prostate biopsy N/A 10/27/2013    Procedure: BIOPSY TRANSRECTAL ULTRASONIC PROSTATE (TUBP);  Surgeon: Molli Hazard, MD;  Location: [facility];  Service: Urology;  Laterality: N/A;  . Cystoscopy w/ retrogrades Bilateral 10/27/2013    Procedure: CYSTOSCOPY WITH RETROGRADE PYELOGRAM;  Surgeon: Molli Hazard, MD;  Location: [facility];  Service: Urology;  Laterality: Bilateral;  . Robot assisted laparoscopic radical prostatectomy N/A 12/18/2013    Procedure: ROBOTIC ASSISTED LAPAROSCOPIC RADICAL PROSTATECTOMY AND INDOCYANINE GREEN DYE;  Surgeon: Alexis Frock, MD;  Location: [facility];  Service: Urology;  Laterality: N/A;  . Lymphadenectomy Bilateral 12/18/2013    Procedure: PELVIC LYMPH NODE DISSECTION;  Surgeon: Alexis Frock, MD;  Location: [facility];  Service: Urology;  Laterality: Bilateral;  . Prostate surgery    . Av fistula placement Left 08/17/2015    Procedure: BRACHIOCEPHALIC ARTERIOVENOUS (AV) FISTULA CREATION  left arm;  Surgeon: Conrad Blandon, MD;  Location: [place];  Service: Vascular;  Laterality: Left;   Social History:  reports that he has never smoked. He has  never used smokeless tobacco. He reports that he does not drink alcohol or use illicit drugs. He lives at home he is a retired Customer service manager. He is independent with ADLs Allergies  Allergen Reactions  . Penicillins Nausea And Vomiting and Rash    Has patient had a PCN reaction causing immediate rash, facial/tongue/throat swelling, SOB or lightheadedness with hypotension: Yes    Family History  Problem Relation Age of Onset  . Cirrhosis Father   . Other Mother     shot and killed     Prior to Admission medications   Medication Sig Start Date End Date Taking? Authorizing Provider  acetaminophen (TYLENOL) 325 MG tablet Take 2 tablets (650 mg total) by mouth every 6 (six) hours as needed for mild pain (or Fever >/= 101). Patient taking differently: Take 650 mg by mouth daily as needed for mild pain (or Fever >/= 101).  09/24/14  Yes Delfina Redwood, MD  amLODipine (NORVASC) 10 MG tablet Take 10 mg by mouth every morning. High Blood prussure   Yes Historical Provider, MD  carvedilol (COREG) 12.5 MG tablet Take 12.5 mg by mouth daily.  07/12/15  Yes Historical Provider, MD  diazepam (VALIUM) 5 MG tablet Take 1 tablet (5 mg total) by mouth every 8 (eight) hours as needed for anxiety. Patient taking differently: Take 5 mg by mouth daily as needed for anxiety.  11/02/14  Yes Evelina Bucy, MD  VIAGRA 100 MG tablet Take 100 mg by mouth daily as needed for erectile dysfunction.  06/25/15  Yes Historical Provider, MD   Physical Exam: Filed Vitals:   10/01/15 0630 10/01/15 0645 10/01/15 0700 10/01/15 0748  BP: 147/96 144/99 149/96 152/96  Pulse: 80 79 86 81  Temp:      TempSrc:      Resp: 23 16 20 20   Height:      Weight:      SpO2: 94% 94% 97% 96%    Wt Readings from Last 3 Encounters:  10/01/15 122.471 kg (270 lb)  09/05/15 127.007 kg (280 lb)  08/17/15 122.471 kg (270 lb)    General:  Appears Slightly anxious and comfortable Eyes: PERRL, normal lids, irises &  conjunctiva ENT: grossly normal hearing, lips & tongue, mucous membranes of his mouth are slightly dry but pink Neck: no LAD, masses or thyromegaly Cardiovascular: RRR, no m/r/g. No LE edema.  Respiratory: Normal effort. Breath sounds with good air movement. Bilateral rhonchi noted particularly right base. Fine crackles in bases as well. No wheezing Abdomen: soft, ntnd is a bowel sounds no guarding or rebounding, obese Skin: no rash or induration seen on limited exam Musculoskeletal: grossly normal tone BUE/BLE Psychiatric: grossly normal mood and affect, speech fluent and appropriate Neurologic: grossly non-focal. Speech clear facial symmetry moves all extremities           Labs on Admission:  Basic Metabolic Panel:  Recent Labs Lab 10/01/15 0418  NA 141  K 3.8  CL 105  CO2 19*  GLUCOSE 103*  BUN 85*  CREATININE 16.90*  CALCIUM 7.0*   Liver Function  Tests: No results for input(s): AST, ALT, ALKPHOS, BILITOT, PROT, ALBUMIN in the last 168 hours. No results for input(s): LIPASE, AMYLASE in the last 168 hours. No results for input(s): AMMONIA in the last 168 hours. CBC:  Recent Labs Lab 10/01/15 0418  WBC 5.9  NEUTROABS 3.3  HGB 8.2*  HCT 25.9*  MCV 82.0  PLT 206   Cardiac Enzymes: No results for input(s): CKTOTAL, CKMB, CKMBINDEX, TROPONINI in the last 168 hours.  BNP (last 3 results) No results for input(s): BNP in the last 8760 hours.  ProBNP (last 3 results) No results for input(s): PROBNP in the last 8760 hours.  CBG: No results for input(s): GLUCAP in the last 168 hours.  Radiological Exams on Admission: Dg Chest 2 View  10/01/2015  CLINICAL DATA:  Hemoptysis for 5 days EXAM: CHEST  2 VIEW COMPARISON:  11/01/2014 FINDINGS: There is patchy opacity in both bases which may represent infectious infiltrate or atelectasis. Pulmonary hemorrhage can also produce this appearance. There is moderate cardiomegaly and aortic tortuosity, unchanged. There are no large  effusions. Pulmonary vasculature is normal. IMPRESSION: Patchy opacities in both lung bases, possibly infectious infiltrate versus hemorrhage. Unchanged cardiomegaly. Electronically Signed   By: Andreas Newport M.D.   On: 10/01/2015 04:59    EKG: Pending  Assessment/Plan Principal Problem:   Hemoptysis Active Problems:   Weakness generalized   Essential hypertension, benign   CKD (chronic kidney disease) stage 3, GFR 30-59 ml/min   Anemia   Unintentional weight loss  #1. Hemoptysis. Etiology uncertain. History of same. Nonsmoker. Chest x-ray with patchy lipase of these possible infectious infiltrate versus hemorrhage. Patient is febrile hemodynamically stable nontoxic appearing. No leukocytosis. INR 1.14.   Hemoglobin 8.2 which is down from 10.9 one year ago -Admit -CT chest -Monitor CBC -SCDs for prophylactic DVT -Consider antibiotics -NPO except sips with meds until evaluated by pulmonary -Pulmonary consult  #2. Anemia. Normocytic. Chart review indicates hemoglobin 9.2 1 month ago and 10.9 a year ago. Likely of chronic disease component.  -Anemia panel -FOBT -Serial CBC  #3. Hypertension. Unfolded in the emergency department. Home medications include Norvasc, Coreg. -Continue home medications -Monitor closely  #4. Chronic kidney disease stage III. Creatinine 16.9. Weeks ago creatinine 11.6. Patient had recent placement of AV fistula but no dialysis scheduled. -Gentle IV fluids -Hold nephrotoxins -Monitor urine output -obtain renal US -nephrology consult        Code Status: full DVT Prophylaxis: Family Communication: none present Disposition Plan: home when ready  Time spent: 28 minutes  [facility]

## 2015-10-01 NOTE — Procedures (Signed)
Patient was seen on dialysis and the procedure was supervised.  BFR 150  Via AVF BP is  153/90.   Patient appears to be tolerating treatment well  Glenys Snader A 10/01/2015

## 2015-10-01 NOTE — Consult Note (Signed)
Name: [name] MRN: [id number] DOB: 1960-07-14    ADMISSION DATE:  10/01/2015 CONSULTATION DATE:  4/14  REFERRING MD : [facility]  CHIEF COMPLAINT: hemoptysis   SIGNIFICANT EVENTS  4/14 hemoptysis  STUDIES:  CT chest 4/14  HISTORY OF PRESENT ILLNESS:   [name] is a 55 yo ,obese(270 lbs) with long standing renal dysfunction but not on HD as of yet who oresents with one episode of 20 cc of bright red hemoptysis 4/14 but reports tasting blood for a week and notes his breath has foul odor. He has never smoked or had hemoptysis in the past. Denies fever , chills, sweats or other malaise.  We will order ANCA and anti GBM and he may need FOB in near future.  PAST MEDICAL HISTORY :   has a past medical history of Hyperlipidemia; Hypertension; Gout; BPH (benign prostatic hypertrophy); Elevated PSA; Microhematuria; Borderline diabetes mellitus; Nocturia; Arthritis; Wears glasses; CKD (chronic kidney disease), stage III; OSA (obstructive sleep apnea); H/O hiatal hernia; Diabetes mellitus without complication ([place]); Cancer ([place]); and Anemia (10/01/2015).  has past surgical history that includes transthoracic echocardiogram (07-03-2006); RIGHT HAND SURGERY (YRS AGO); Cardiac catheterization (10-04-2006   DR [facility]); Cardiac catheterization (04-07-2002  DR [facility]); Prostate biopsy (N/A, 10/27/2013); Cystoscopy w/ retrogrades (Bilateral, 10/27/2013); Robot assisted laparoscopic radical prostatectomy (N/A, 12/18/2013); Lymphadenectomy (Bilateral, 12/18/2013); Prostate surgery; and AV fistula placement (Left, 08/17/2015). Prior to Admission medications   Medication Sig Start Date End Date Taking? Authorizing Provider  acetaminophen (TYLENOL) 325 MG tablet Take 2 tablets (650 mg total) by mouth every 6 (six) hours as needed for mild pain (or Fever >/= 101). Patient taking differently: Take 650 mg by mouth daily as needed for mild pain (or Fever >/= 101).  09/24/14  Yes Delfina Redwood, MD  amLODipine  (NORVASC) 10 MG tablet Take 10 mg by mouth every morning. High Blood prussure   Yes Historical Provider, MD  carvedilol (COREG) 12.5 MG tablet Take 12.5 mg by mouth daily.  07/12/15  Yes Historical Provider, MD  diazepam (VALIUM) 5 MG tablet Take 1 tablet (5 mg total) by mouth every 8 (eight) hours as needed for anxiety. Patient taking differently: Take 5 mg by mouth daily as needed for anxiety.  11/02/14  Yes Evelina Bucy, MD  VIAGRA 100 MG tablet Take 100 mg by mouth daily as needed for erectile dysfunction.  06/25/15  Yes Historical Provider, MD   Allergies  Allergen Reactions  . Penicillins Nausea And Vomiting and Rash    Has patient had a PCN reaction causing immediate rash, facial/tongue/throat swelling, SOB or lightheadedness with hypotension: Yes    FAMILY HISTORY:  family history includes Cirrhosis in his father; Other in his mother. SOCIAL HISTORY:  reports that he has never smoked. He has never used smokeless tobacco. He reports that he does not drink alcohol or use illicit drugs.  REVIEW OF SYSTEMS:   10 point review of system taken, please see HPI for positives and negatives. . SUBJECTIVE:  Comfortable Single episode hemoptysis He has been fatigued, less energy  VITAL SIGNS: Temp:  [98.6 F (37 C)-98.8 F (37.1 C)] 98.6 F (37 C) (04/14 1039) Pulse Rate:  [77-95] 78 (04/14 1039) Resp:  [13-31] 20 (04/14 1039) BP: (139-153)/(85-100) 153/90 mmHg (04/14 1039) SpO2:  [94 %-100 %] 100 % (04/14 1039) Weight:  [264 lb 1.6 oz (119.795 kg)-270 lb (122.471 kg)] 264 lb 1.6 oz (119.795 kg) (04/14 1039)  PHYSICAL EXAMINATION: General:  WNWDAAM on no distress Neuro: Intact HEENT:  No JVD/LAN Cardiovascular:  HSR RRR Lungs: CTA Abdomen:  Obese + bs Musculoskeletal:  intact Skin:  Warm   Recent Labs Lab 10/01/15 0418 10/01/15 0931  NA 141  --   K 3.8  --   CL 105  --   CO2 19*  --   BUN 85*  --   CREATININE 16.90* 16.47*  GLUCOSE 103*  --     Recent Labs Lab  10/01/15 0418 10/01/15 0931 10/01/15 1137  HGB 8.2* 7.7* 8.0*  HCT 25.9* 24.9* 24.7*  WBC 5.9 5.7  --   PLT 206 190  --    Dg Chest 2 View  10/01/2015  CLINICAL DATA:  Hemoptysis for 5 days EXAM: CHEST  2 VIEW COMPARISON:  11/01/2014 FINDINGS: There is patchy opacity in both bases which may represent infectious infiltrate or atelectasis. Pulmonary hemorrhage can also produce this appearance. There is moderate cardiomegaly and aortic tortuosity, unchanged. There are no large effusions. Pulmonary vasculature is normal. IMPRESSION: Patchy opacities in both lung bases, possibly infectious infiltrate versus hemorrhage. Unchanged cardiomegaly. Electronically Signed   By: Andreas Newport M.D.   On: 10/01/2015 04:59   Ct Chest Wo Contrast  10/01/2015  CLINICAL DATA:  Diffuse weakness.  Hemoptysis EXAM: CT CHEST WITHOUT CONTRAST TECHNIQUE: Multidetector CT imaging of the chest was performed following the standard protocol without IV contrast. COMPARISON:  None FINDINGS: THORACIC INLET/BODY WALL: Gynecomastia.  No acute finding. MEDIASTINUM: Cardiomegaly. No pericardial effusion. No acute vascular abnormality. No adenopathy. LUNG WINDOWS: Symmetric ground-glass and consolidative opacities in the dependent lungs with airway and septal thickening associated. No generalized septal thickening suggestive of edema. No pleural effusion. No primary cause of hemoptysis is seen, including bronchiectasis, cavitary lesion, or endobronchial lesion. UPPER ABDOMEN: Symmetric renal atrophy.  No acute finding OSSEOUS: No acute fracture.  No suspicious lytic or blastic lesions. IMPRESSION: Bibasilar airspace disease, at least partially alveolar hemorrhage given the history. Superimposed pneumonia cannot be excluded. No definitive cause of hemoptysis is seen. Given renal failure question pulmonary renal syndrome. Electronically Signed   By: Monte Fantasia M.D.   On: 10/01/2015 10:16    ASSESSMENT:    Hemoptysis. One episode  20 cc bright red blood 4/14   Weakness generalized, suspect due to uremia   Essential hypertension, benign   CKD (chronic kidney disease) stage 3, GFR 30-59 ml/min now progressed to stage 5, creatinine 16, to start HD 4/14   Prostate cancer [facility]) radical prostatectomy 2015    Anemia   Unintentional weight loss  Discussion: Mr. Cude is a 54 yo ,obese(270 lbs) with long standing renal dysfunction but not on HD as of yet who oresents with one episode of 20 cc of bright red hemoptysis 4/14 but reports tasting blood for a week and notes his breath has foul odor. He has never smoke or had hemoptysis in the past. Denies fever , chills, sweats or other malaise.  We will order ANCA and anti GBM and he may need FOB in near future.  PLAN: Check  ANCA and anti GBM and he may need FOB in near future. Md to evaluate  [facility] Minor ACNP Maryanna Shape PCCM Pager [phone] till 3 pm If no answer page [phone] 10/01/2015, 2:12 PM   Attending Note:  I have examined patient, reviewed labs, studies and notes. I have discussed the case with S Minor, and I agree with the data and plans as amended above. 55 yo man, hx progressive renal failure followed outpt, approaching HD. Has had lethargy  and weakness for weeks, experienced small volume hemoptysis on 4/14. CT chest shows bilateral basilar scattered mixed interstitial and alveolar opacities without any evidence for mass, endobronchial lesion. His UA has blood and protein. 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 I have sent a-GBM and ANCA's. He will likely need bronchoscopy to visualize airways and r/o endobronchial lesion at some point. I would defer this until his serologies are resulted as this may influence whether we decide to perform transbronchial biopsies as well. Depending on results we may decide to instead pursue renal bx, which may be more sensitive for a pulmonary-renal vasculitis. We will follow with you, arrange for FOB when appropriate.     Baltazar Apo, MD, PhD 10/01/2015, 3:28 PM [facility] [phone] or if no answer [phone]

## 2015-10-01 NOTE — ED Notes (Signed)
Admitting MD at the bedside.  

## 2015-10-02 DIAGNOSIS — G4733 Obstructive sleep apnea (adult) (pediatric): Secondary | ICD-10-CM | POA: Diagnosis present

## 2015-10-02 DIAGNOSIS — D631 Anemia in chronic kidney disease: Secondary | ICD-10-CM | POA: Diagnosis present

## 2015-10-02 DIAGNOSIS — N186 End stage renal disease: Secondary | ICD-10-CM | POA: Diagnosis present

## 2015-10-02 DIAGNOSIS — D649 Anemia, unspecified: Secondary | ICD-10-CM | POA: Diagnosis not present

## 2015-10-02 DIAGNOSIS — Z6836 Body mass index (BMI) 36.0-36.9, adult: Secondary | ICD-10-CM | POA: Diagnosis not present

## 2015-10-02 DIAGNOSIS — Z9119 Patient's noncompliance with other medical treatment and regimen: Secondary | ICD-10-CM | POA: Diagnosis not present

## 2015-10-02 DIAGNOSIS — I10 Essential (primary) hypertension: Secondary | ICD-10-CM | POA: Diagnosis not present

## 2015-10-02 DIAGNOSIS — R042 Hemoptysis: Secondary | ICD-10-CM | POA: Diagnosis present

## 2015-10-02 DIAGNOSIS — N183 Chronic kidney disease, stage 3 (moderate): Secondary | ICD-10-CM | POA: Diagnosis not present

## 2015-10-02 DIAGNOSIS — R52 Pain, unspecified: Secondary | ICD-10-CM | POA: Diagnosis not present

## 2015-10-02 DIAGNOSIS — R05 Cough: Secondary | ICD-10-CM | POA: Diagnosis present

## 2015-10-02 DIAGNOSIS — Z8546 Personal history of malignant neoplasm of prostate: Secondary | ICD-10-CM | POA: Diagnosis not present

## 2015-10-02 DIAGNOSIS — D538 Other specified nutritional anemias: Secondary | ICD-10-CM | POA: Diagnosis not present

## 2015-10-02 DIAGNOSIS — I471 Supraventricular tachycardia: Secondary | ICD-10-CM | POA: Diagnosis not present

## 2015-10-02 DIAGNOSIS — N189 Chronic kidney disease, unspecified: Secondary | ICD-10-CM | POA: Diagnosis not present

## 2015-10-02 DIAGNOSIS — E785 Hyperlipidemia, unspecified: Secondary | ICD-10-CM | POA: Diagnosis present

## 2015-10-02 DIAGNOSIS — M109 Gout, unspecified: Secondary | ICD-10-CM | POA: Diagnosis present

## 2015-10-02 DIAGNOSIS — R7303 Prediabetes: Secondary | ICD-10-CM | POA: Diagnosis present

## 2015-10-02 DIAGNOSIS — Z880 Allergy status to penicillin: Secondary | ICD-10-CM | POA: Diagnosis not present

## 2015-10-02 DIAGNOSIS — I82619 Acute embolism and thrombosis of superficial veins of unspecified upper extremity: Secondary | ICD-10-CM | POA: Diagnosis present

## 2015-10-02 DIAGNOSIS — N185 Chronic kidney disease, stage 5: Secondary | ICD-10-CM | POA: Diagnosis not present

## 2015-10-02 DIAGNOSIS — Z9079 Acquired absence of other genital organ(s): Secondary | ICD-10-CM | POA: Diagnosis not present

## 2015-10-02 DIAGNOSIS — I120 Hypertensive chronic kidney disease with stage 5 chronic kidney disease or end stage renal disease: Secondary | ICD-10-CM | POA: Diagnosis present

## 2015-10-02 LAB — CBC
HCT: 25 % — ABNORMAL LOW (ref 39.0–52.0)
Hemoglobin: 7.7 g/dL — ABNORMAL LOW (ref 13.0–17.0)
MCH: 25.3 pg — ABNORMAL LOW (ref 26.0–34.0)
MCHC: 30.8 g/dL (ref 30.0–36.0)
MCV: 82.2 fL (ref 78.0–100.0)
Platelets: 178 10*3/uL (ref 150–400)
RBC: 3.04 MIL/uL — ABNORMAL LOW (ref 4.22–5.81)
RDW: 15.5 % (ref 11.5–15.5)
WBC: 5.1 10*3/uL (ref 4.0–10.5)

## 2015-10-02 LAB — RENAL FUNCTION PANEL
ALBUMIN: 2.8 g/dL — AB (ref 3.5–5.0)
Anion gap: 15 (ref 5–15)
BUN: 62 mg/dL — AB (ref 6–20)
CHLORIDE: 106 mmol/L (ref 101–111)
CO2: 20 mmol/L — ABNORMAL LOW (ref 22–32)
Calcium: 7.3 mg/dL — ABNORMAL LOW (ref 8.9–10.3)
Creatinine, Ser: 13.19 mg/dL — ABNORMAL HIGH (ref 0.61–1.24)
GFR calc Af Amer: 4 mL/min — ABNORMAL LOW (ref >60)
GFR, EST NON AFRICAN AMERICAN: 4 mL/min — AB (ref >60)
GLUCOSE: 96 mg/dL (ref 65–99)
PHOSPHORUS: 5.1 mg/dL — AB (ref 2.5–4.6)
POTASSIUM: 3.6 mmol/L (ref 3.5–5.1)
Sodium: 141 mmol/L (ref 135–145)

## 2015-10-02 LAB — HEPATITIS B CORE ANTIBODY, TOTAL: Hep B Core Total Ab: NEGATIVE

## 2015-10-02 LAB — HEPATITIS B SURFACE ANTIGEN: HEP B S AG: NEGATIVE

## 2015-10-02 LAB — OCCULT BLOOD X 1 CARD TO LAB, STOOL: Fecal Occult Bld: NEGATIVE

## 2015-10-02 LAB — PARATHYROID HORMONE, INTACT (NO CA): PTH: 531 pg/mL — ABNORMAL HIGH (ref 15–65)

## 2015-10-02 LAB — HEPATITIS B SURFACE ANTIBODY,QUALITATIVE: HEP B S AB: REACTIVE

## 2015-10-02 MED ORDER — CALCITRIOL 0.5 MCG PO CAPS
0.5000 ug | ORAL_CAPSULE | Freq: Every day | ORAL | Status: DC
  Administered 2015-10-03 – 2015-10-09 (×7): 0.5 ug via ORAL
  Filled 2015-10-02 (×8): qty 1

## 2015-10-02 MED ORDER — METOPROLOL TARTRATE 1 MG/ML IV SOLN
5.0000 mg | Freq: Four times a day (QID) | INTRAVENOUS | Status: DC | PRN
  Administered 2015-10-02: 5 mg via INTRAVENOUS
  Filled 2015-10-02: qty 5

## 2015-10-02 MED ORDER — CALCIUM ACETATE (PHOS BINDER) 667 MG PO CAPS
1334.0000 mg | ORAL_CAPSULE | Freq: Three times a day (TID) | ORAL | Status: DC
  Administered 2015-10-02 – 2015-10-09 (×17): 1334 mg via ORAL
  Filled 2015-10-02 (×18): qty 2

## 2015-10-02 MED ORDER — AMLODIPINE BESYLATE 5 MG PO TABS
5.0000 mg | ORAL_TABLET | Freq: Every morning | ORAL | Status: DC
  Administered 2015-10-02 – 2015-10-09 (×8): 5 mg via ORAL
  Filled 2015-10-02 (×7): qty 1

## 2015-10-02 MED ORDER — HEPARIN SODIUM (PORCINE) 1000 UNIT/ML DIALYSIS
20.0000 [IU]/kg | INTRAMUSCULAR | Status: DC | PRN
  Filled 2015-10-02: qty 3

## 2015-10-02 MED ORDER — SODIUM CHLORIDE 0.9 % IV SOLN
125.0000 mg | Freq: Every day | INTRAVENOUS | Status: AC
  Administered 2015-10-02 – 2015-10-08 (×7): 125 mg via INTRAVENOUS
  Filled 2015-10-02 (×12): qty 10

## 2015-10-02 MED ORDER — DARBEPOETIN ALFA 150 MCG/0.3ML IJ SOSY
150.0000 ug | PREFILLED_SYRINGE | INTRAMUSCULAR | Status: DC
  Administered 2015-10-02: 150 ug via INTRAVENOUS
  Filled 2015-10-02 (×2): qty 0.3

## 2015-10-02 NOTE — Progress Notes (Signed)
Subjective:  S/p first HD yest- went well- pulm saw and serologies sent for pulm/renal syndrome- said last night was first night with no itching- no appetite this AM- no more coughing/hemoptysis Objective Vital signs in last 24 hours: Filed Vitals:   10/01/15 1830 10/01/15 2132 10/02/15 0100 10/02/15 0530  BP: 153/86 142/94 139/82 137/81  Pulse: 74 78 79 80  Temp: 98.3 F (36.8 C) 99.5 F (37.5 C) 98.1 F (36.7 C) 99.1 F (37.3 C)  TempSrc: Oral Oral Oral Oral  Resp: 14 18 18 18   Height:      Weight: 120.3 kg (265 lb 3.4 oz)   118.752 kg (261 lb 12.8 oz)  SpO2: 100% 100% 98% 96%   Weight change: -2.676 kg (-5 lb 14.4 oz)  Intake/Output Summary (Last 24 hours) at 10/02/15 0946 Last data filed at 10/02/15 0700  Gross per 24 hour  Intake    240 ml  Output   1283 ml  Net  -1043 ml     Assessment/Plan: 55 year old black male with progressive renal insufficiency now with uremic symptoms  1.Renal- advanced and progressive chronic kidney disease. I do feel that he is now at ESRD. I discussed with him that the symptoms he is having uremic in nature and are an indication to initiate dialysis therapy. He is reluctantly agreeable. First tx yest 17-gauge needles for a 2 hour treatment plan second treatment for today. If AVF fails will need PermCath this admission- but so far so good. We'll also look for outpatient dialysisarrangements  2.Hemoptysis - with some patchy infiltrate on chest x-ray. This is not something that would normally go along with uremia. Workup per primary team - placed on doxycycline- I am less suspicious of pulm/renal syndrome- serologies will help- would be very mild in terms of pulm findings and likely no reversibility from a kidney perspective  So may not warrant really aggressive management 3. HTN/volume- if anything slightly volume overloaded. Not sure if this is what is giving him cough. We will  attempt some gentle ultrafiltration with dialysis today. Continue his  home doses of Norvasc and Coreg - actually will take norvasc down- usually BP becomes more manageable on dialysis 4. Anemia - doesn't appear to have been on ESA as outpatient. Hemoglobin 7.7. Iron stores low- will replete and started ESA 5. Bones- PTH over 500- start cacitriol and phoslo    Adarrius Graeff A    Labs: Basic Metabolic Panel:  Recent Labs Lab 10/01/15 0418 10/01/15 0931 10/02/15 0213  NA 141  --  141  K 3.8  --  3.6  CL 105  --  106  CO2 19*  --  20*  GLUCOSE 103*  --  96  BUN 85*  --  62*  CREATININE 16.90* 16.47* 13.19*  CALCIUM 7.0*  --  7.3*  PHOS  --  7.0* 5.1*   Liver Function Tests:  Recent Labs Lab 10/01/15 0931 10/02/15 0213  ALBUMIN 3.1* 2.8*   No results for input(s): LIPASE, AMYLASE in the last 168 hours. No results for input(s): AMMONIA in the last 168 hours. CBC:  Recent Labs Lab 10/01/15 0418 10/01/15 0931 10/01/15 1137 10/01/15 1350 10/02/15 0213  WBC 5.9 5.7  --   --  5.1  NEUTROABS 3.3  --   --   --   --   HGB 8.2* 7.7* 8.0* 7.6* 7.7*  HCT 25.9* 24.9* 24.7* 24.3* 25.0*  MCV 82.0 81.9  --   --  82.2  PLT 206 190  --   --  178   Cardiac Enzymes: No results for input(s): CKTOTAL, CKMB, CKMBINDEX, TROPONINI in the last 168 hours. CBG: No results for input(s): GLUCAP in the last 168 hours.  Iron Studies:  Recent Labs  10/01/15 0931  IRON 23*  TIBC 220*  FERRITIN 133   Studies/Results: Dg Chest 2 View  10/01/2015  CLINICAL DATA:  Hemoptysis for 5 days EXAM: CHEST  2 VIEW COMPARISON:  11/01/2014 FINDINGS: There is patchy opacity in both bases which may represent infectious infiltrate or atelectasis. Pulmonary hemorrhage can also produce this appearance. There is moderate cardiomegaly and aortic tortuosity, unchanged. There are no large effusions. Pulmonary vasculature is normal. IMPRESSION: Patchy opacities in both lung bases, possibly infectious infiltrate versus hemorrhage. Unchanged cardiomegaly. Electronically  Signed   By: Andreas Newport M.D.   On: 10/01/2015 04:59   Ct Chest Wo Contrast  10/01/2015  CLINICAL DATA:  Diffuse weakness.  Hemoptysis EXAM: CT CHEST WITHOUT CONTRAST TECHNIQUE: Multidetector CT imaging of the chest was performed following the standard protocol without IV contrast. COMPARISON:  None FINDINGS: THORACIC INLET/BODY WALL: Gynecomastia.  No acute finding. MEDIASTINUM: Cardiomegaly. No pericardial effusion. No acute vascular abnormality. No adenopathy. LUNG WINDOWS: Symmetric ground-glass and consolidative opacities in the dependent lungs with airway and septal thickening associated. No generalized septal thickening suggestive of edema. No pleural effusion. No primary cause of hemoptysis is seen, including bronchiectasis, cavitary lesion, or endobronchial lesion. UPPER ABDOMEN: Symmetric renal atrophy.  No acute finding OSSEOUS: No acute fracture.  No suspicious lytic or blastic lesions. IMPRESSION: Bibasilar airspace disease, at least partially alveolar hemorrhage given the history. Superimposed pneumonia cannot be excluded. No definitive cause of hemoptysis is seen. Given renal failure question pulmonary renal syndrome. Electronically Signed   By: Monte Fantasia M.D.   On: 10/01/2015 10:16   US Renal  10/01/2015  CLINICAL DATA:  Chronic renal disease. EXAM: RENAL / URINARY TRACT ULTRASOUND COMPLETE COMPARISON:  Ultrasound 01/12/2015. FINDINGS: Right Kidney: Length: 8.7 cm. The kidney is again noted to be echogenic consistent chronic medical renal disease. No mass or hydronephrosis visualized. Left Kidney: Length: 9.1 cm. The kidney is again noted to be echogenic consistent chronic medical renal disease. No mass or hydronephrosis visualized. Bladder: Appears normal for degree of bladder distention. IMPRESSION: 1. Echogenic kidneys are again noted consistent chronic medical renal disease. 2. No evidence of hydronephrosis or bladder distention. No acute abnormality identified. Electronically  Signed   By: Marcello Moores  Register   On: 10/01/2015 16:00   Medications: Infusions:    Scheduled Medications: . amLODipine  10 mg Oral q morning - 10a  . carvedilol  12.5 mg Oral Daily  . doxycycline  100 mg Oral Q12H    have reviewed scheduled and prn medications.  Physical Exam: General: NAD Heart: RRR Lungs: mostly clear Abdomen: soft, non tender Extremities: minimal edema Dialysis Access: left upper arm AVF- good thrill and bruit- no bruising    10/02/2015,9:46 AM  LOS: 1 day

## 2015-10-02 NOTE — Progress Notes (Signed)
Patient ID: [name], male   DOB: April 13, 1961, 55 y.o.   MRN: [id number]    PROGRESS NOTE    [name]  [id number] DOB: 06-24-1960 DOA: 10/01/2015  PCP: Myriam Jacobson, MD   Outpatient Specialists:   Brief Narrative:  55 y.o. male the CKD, s/p recent placement of AV fistula, had first HD 4/14, HTN, sleep apnea, presented to [facility] ED for evaluation of hemoptysis associated with several weeks duration of progressively worsening productive cough,  decreased appetite and a 15 pound unintentional weight loss over the last 4-6 weeks.  Assessment & Plan:   Principal Problem:   Hemoptysis - unclear etiology - pt started on doxycycline, will continue for now - PCCM following, appreciate assistance, plan for FOB, likely next week   Active Problems:   ESRD - had first HD 4/14, tolerated fairly well but some tachycardia which is still present, SVT - appreciate nephrology team following     Essential HTN - reasonably stable  - continue with home regimen Norvasc and Coreg    Anemia of chronic disease, ? Kidney dz - Hg 7.7, iron stores low, supplement     Morbid obesity du eto excess calories  - pt meets criteria with BMI > 35 and underlying HTN, kidney disease  - Body mass index is 36.53 kg/(m^2).  DVT prophylaxis: SCD's Code Status: Full  Family Communication: Patient and daughter at bedside  Disposition Plan: likely mid next week, based on clinical progress and once consultants clear   Consultants:   Nephrology  PCCM  Procedures:   None  Antimicrobials:   Doxycycline 4/14 -->  Subjective: Reports feeling better, less dyspnea this AM.  Objective: Filed Vitals:   10/02/15 0530 10/02/15 0900 10/02/15 1141 10/02/15 1411  BP: 137/81 126/76 137/85 114/76  Pulse: 80  82 129  Temp: 99.1 F (37.3 C)  98.6 F (37 C)   TempSrc: Oral  Oral   Resp: 18  18 18   Height:      Weight: 118.752 kg (261 lb 12.8 oz)     SpO2: 96%  98% 96%    Intake/Output  Summary (Last 24 hours) at 10/02/15 1435 Last data filed at 10/02/15 1300  Gross per 24 hour  Intake    480 ml  Output   1508 ml  Net  -1028 ml   Filed Weights   10/01/15 1631 10/01/15 1830 10/02/15 0530  Weight: 120.3 kg (265 lb 3.4 oz) 120.3 kg (265 lb 3.4 oz) 118.752 kg (261 lb 12.8 oz)    Examination:  General exam: Appears calm and comfortable  Respiratory system: Clear to auscultation. Respiratory effort normal. Cardiovascular system: S1 & S2 heard, tachycardic, rubs, gallops or clicks. No pedal edema. Gastrointestinal system: Abdomen is nondistended, soft and nontender.  Central nervous system: Alert and oriented. No focal neurological deficits. Extremities: Symmetric 5 x 5 power.   Data Reviewed: I have personally reviewed following labs and imaging studies  CBC:  Recent Labs Lab 10/01/15 0418 10/01/15 0931 10/01/15 1137 10/01/15 1350 10/02/15 0213  WBC 5.9 5.7  --   --  5.1  NEUTROABS 3.3  --   --   --   --   HGB 8.2* 7.7* 8.0* 7.6* 7.7*  HCT 25.9* 24.9* 24.7* 24.3* 25.0*  MCV 82.0 81.9  --   --  82.2  PLT 206 190  --   --  0000000   Basic Metabolic Panel:  Recent Labs Lab 10/01/15 0418 10/01/15 0931 10/02/15 0213  NA  141  --  141  K 3.8  --  3.6  CL 105  --  106  CO2 19*  --  20*  GLUCOSE 103*  --  96  BUN 85*  --  62*  CREATININE 16.90* 16.47* 13.19*  CALCIUM 7.0*  --  7.3*  MG  --  2.2  --   PHOS  --  7.0* 5.1*   Liver Function Tests:  Recent Labs Lab 10/01/15 0931 10/02/15 0213  ALBUMIN 3.1* 2.8*   Coagulation Profile:  Recent Labs Lab 10/01/15 0606  INR 1.14   Anemia Panel:  Recent Labs  10/01/15 0931  VITAMINB12 5685*  FOLATE 8.0  FERRITIN 133  TIBC 220*  IRON 23*  RETICCTPCT 0.9   Urine analysis:    Component Value Date/Time   COLORURINE YELLOW 10/01/2015 1256   APPEARANCEUR CLEAR 10/01/2015 1256   LABSPEC 1.010 10/01/2015 1256   PHURINE 6.5 10/01/2015 1256   GLUCOSEU NEGATIVE 10/01/2015 1256   HGBUR MODERATE*  10/01/2015 1256   BILIRUBINUR NEGATIVE 10/01/2015 1256   KETONESUR NEGATIVE 10/01/2015 1256   PROTEINUR 100* 10/01/2015 1256   UROBILINOGEN 0.2 09/22/2014 1346   NITRITE NEGATIVE 10/01/2015 1256   LEUKOCYTESUR NEGATIVE 10/01/2015 1256   Radiology Studies: Dg Chest 2 View 10/01/2015  Patchy opacities in both lung bases, possibly infectious infiltrate versus hemorrhage. Unchanged cardiomegaly.   Ct Chest Wo Contrast 10/01/2015  Bibasilar airspace disease, at least partially alveolar hemorrhage given the history. Superimposed pneumonia cannot be excluded. No definitive cause of hemoptysis is seen. Given renal failure question pulmonary renal syndrome.   US Renal 10/01/2015  Echogenic kidneys are again noted consistent chronic medical renal disease. 2. No evidence of hydronephrosis or bladder distention. No acute abnormality identified.  Scheduled Meds: . [START ON 10/03/2015] amLODipine  5 mg Oral q morning - 10a  . calcitRIOL  0.5 mcg Oral Daily  . calcium acetate  1,334 mg Oral TID WC  . carvedilol  12.5 mg Oral Daily  . darbepoetin (ARANESP) injection - DIALYSIS  150 mcg Intravenous Q Sat-HD  . doxycycline  100 mg Oral Q12H  . ferric gluconate (FERRLECIT/NULECIT) IV  125 mg Intravenous Daily   Continuous Infusions:    LOS: 1 day   Time spent: 20 minutes   Faye Ramsay, MD [facility] Pager [phone]  If 7PM-7AM, please contact night-coverage [email or web address] Password [email or web address] 10/02/2015, 2:35 PM

## 2015-10-02 NOTE — Progress Notes (Signed)
Pt had infiltration of AVF shortly after initiation of HD today.  It ran well yesterday - will abort tx today, ice over weekend and hope we are able to complete a second treatment on Monday   [name] A

## 2015-10-02 NOTE — Progress Notes (Signed)
Pt's HR was in 130's earlier while he was up washing, voicing no complaints. Went to dialysis and HR remains in 130's since return. Dr Olen Pel texted

## 2015-10-03 DIAGNOSIS — N183 Chronic kidney disease, stage 3 (moderate): Secondary | ICD-10-CM

## 2015-10-03 LAB — CBC
HEMATOCRIT: 24.1 % — AB (ref 39.0–52.0)
Hemoglobin: 7.5 g/dL — ABNORMAL LOW (ref 13.0–17.0)
MCH: 25.5 pg — AB (ref 26.0–34.0)
MCHC: 31.1 g/dL (ref 30.0–36.0)
MCV: 82 fL (ref 78.0–100.0)
PLATELETS: 182 10*3/uL (ref 150–400)
RBC: 2.94 MIL/uL — AB (ref 4.22–5.81)
RDW: 15.2 % (ref 11.5–15.5)
WBC: 5.3 10*3/uL (ref 4.0–10.5)

## 2015-10-03 LAB — RENAL FUNCTION PANEL
Albumin: 2.8 g/dL — ABNORMAL LOW (ref 3.5–5.0)
Anion gap: 13 (ref 5–15)
BUN: 67 mg/dL — AB (ref 6–20)
CHLORIDE: 104 mmol/L (ref 101–111)
CO2: 23 mmol/L (ref 22–32)
CREATININE: 14.51 mg/dL — AB (ref 0.61–1.24)
Calcium: 7.4 mg/dL — ABNORMAL LOW (ref 8.9–10.3)
GFR calc Af Amer: 4 mL/min — ABNORMAL LOW (ref >60)
GFR calc non Af Amer: 3 mL/min — ABNORMAL LOW (ref >60)
Glucose, Bld: 102 mg/dL — ABNORMAL HIGH (ref 65–99)
Phosphorus: 5.5 mg/dL — ABNORMAL HIGH (ref 2.5–4.6)
Potassium: 3.7 mmol/L (ref 3.5–5.1)
Sodium: 140 mmol/L (ref 135–145)

## 2015-10-03 LAB — MPO/PR-3 (ANCA) ANTIBODIES

## 2015-10-03 NOTE — Progress Notes (Signed)
Subjective:  Had infiltration with HD yest - no more cough  Objective Vital signs in last 24 hours: Filed Vitals:   10/02/15 1141 10/02/15 1411 10/02/15 2030 10/03/15 0531  BP: 137/85 114/76 129/77 138/81  Pulse: 82 129 78 77  Temp: 98.6 F (37 C)  99.1 F (37.3 C) 98.9 F (37.2 C)  TempSrc: Oral  Oral Oral  Resp: 18 18 18 18   Height:      Weight:    119.341 kg (263 lb 1.6 oz)  SpO2: 98% 96% 97% 96%   Weight change: -0.454 kg (-1 lb)  Intake/Output Summary (Last 24 hours) at 10/03/15 0933 Last data filed at 10/03/15 0900  Gross per 24 hour  Intake    480 ml  Output    425 ml  Net     55 ml     Assessment/Plan: 55 year old black male with progressive renal insufficiency now with uremic symptoms  1.Renal- advanced and progressive chronic kidney disease. I do feel that he is now at ESRD. I discussed with him that the symptoms he is having uremic in nature and are an indication to initiate dialysis therapy. He is reluctantly agreeable. First tx Friday with 17-gauge needles for a 2 hour treatment went well-  second treatment Saturday had infiltration.  Will attempt another trial at using AVF tomorrow for second tx.  If AVF fails will need PermCath this admission. We'll also look for outpatient dialysisarrangements - seems like [place] 2.Hemoptysis - with some patchy infiltrate on chest x-ray. This is not something that would normally go along with uremia. Workup per primary team - placed on doxycycline- I am less suspicious of pulm/renal syndrome- serologies pending- would be very mild in terms of pulm findings and likely no reversibility from a kidney perspective  So may not warrant really aggressive management 3. HTN/volume- if anything slightly volume overloaded. Not sure if this is what is giving him cough. We will  attempt some gentle ultrafiltration with dialysis tomorrow. Continue  Norvasc and Coreg - but take norvasc down- usually BP becomes more manageable on dialysis 4.  Anemia - doesn't appear to have been on ESA as outpatient. Hemoglobin 7.7. Iron stores low- will replete and started ESA 5. Bones- PTH over 500- start cacitriol and phoslo    Sereen Schaff A    Labs: Basic Metabolic Panel:  Recent Labs Lab 10/01/15 0418 10/01/15 0931 10/02/15 0213 10/03/15 0226  NA 141  --  141 140  K 3.8  --  3.6 3.7  CL 105  --  106 104  CO2 19*  --  20* 23  GLUCOSE 103*  --  96 102*  BUN 85*  --  62* 67*  CREATININE 16.90* 16.47* 13.19* 14.51*  CALCIUM 7.0*  --  7.3* 7.4*  PHOS  --  7.0* 5.1* 5.5*   Liver Function Tests:  Recent Labs Lab 10/01/15 0931 10/02/15 0213 10/03/15 0226  ALBUMIN 3.1* 2.8* 2.8*   No results for input(s): LIPASE, AMYLASE in the last 168 hours. No results for input(s): AMMONIA in the last 168 hours. CBC:  Recent Labs Lab 10/01/15 0418 10/01/15 0931  10/01/15 1350 10/02/15 0213 10/03/15 0226  WBC 5.9 5.7  --   --  5.1 5.3  NEUTROABS 3.3  --   --   --   --   --   HGB 8.2* 7.7*  < > 7.6* 7.7* 7.5*  HCT 25.9* 24.9*  < > 24.3* 25.0* 24.1*  MCV 82.0 81.9  --   --  82.2 82.0  PLT 206 190  --   --  178 182  < > = values in this interval not displayed. Cardiac Enzymes: No results for input(s): CKTOTAL, CKMB, CKMBINDEX, TROPONINI in the last 168 hours. CBG: No results for input(s): GLUCAP in the last 168 hours.  Iron Studies:   Recent Labs  10/01/15 0931  IRON 23*  TIBC 220*  FERRITIN 133   Studies/Results: Ct Chest Wo Contrast  10/01/2015  CLINICAL DATA:  Diffuse weakness.  Hemoptysis EXAM: CT CHEST WITHOUT CONTRAST TECHNIQUE: Multidetector CT imaging of the chest was performed following the standard protocol without IV contrast. COMPARISON:  None FINDINGS: THORACIC INLET/BODY WALL: Gynecomastia.  No acute finding. MEDIASTINUM: Cardiomegaly. No pericardial effusion. No acute vascular abnormality. No adenopathy. LUNG WINDOWS: Symmetric ground-glass and consolidative opacities in the dependent lungs with  airway and septal thickening associated. No generalized septal thickening suggestive of edema. No pleural effusion. No primary cause of hemoptysis is seen, including bronchiectasis, cavitary lesion, or endobronchial lesion. UPPER ABDOMEN: Symmetric renal atrophy.  No acute finding OSSEOUS: No acute fracture.  No suspicious lytic or blastic lesions. IMPRESSION: Bibasilar airspace disease, at least partially alveolar hemorrhage given the history. Superimposed pneumonia cannot be excluded. No definitive cause of hemoptysis is seen. Given renal failure question pulmonary renal syndrome. Electronically Signed   By: Monte Fantasia M.D.   On: 10/01/2015 10:16   US Renal  10/01/2015  CLINICAL DATA:  Chronic renal disease. EXAM: RENAL / URINARY TRACT ULTRASOUND COMPLETE COMPARISON:  Ultrasound 01/12/2015. FINDINGS: Right Kidney: Length: 8.7 cm. The kidney is again noted to be echogenic consistent chronic medical renal disease. No mass or hydronephrosis visualized. Left Kidney: Length: 9.1 cm. The kidney is again noted to be echogenic consistent chronic medical renal disease. No mass or hydronephrosis visualized. Bladder: Appears normal for degree of bladder distention. IMPRESSION: 1. Echogenic kidneys are again noted consistent chronic medical renal disease. 2. No evidence of hydronephrosis or bladder distention. No acute abnormality identified. Electronically Signed   By: Marcello Moores  Register   On: 10/01/2015 16:00   Medications: Infusions:    Scheduled Medications: . amLODipine  5 mg Oral q morning - 10a  . calcitRIOL  0.5 mcg Oral Daily  . calcium acetate  1,334 mg Oral TID WC  . carvedilol  12.5 mg Oral Daily  . darbepoetin (ARANESP) injection - DIALYSIS  150 mcg Intravenous Q Sat-HD  . doxycycline  100 mg Oral Q12H  . ferric gluconate (FERRLECIT/NULECIT) IV  125 mg Intravenous Daily    have reviewed scheduled and prn medications.  Physical Exam: General: NAD Heart: RRR Lungs: mostly clear Abdomen:  soft, non tender Extremities: minimal edema Dialysis Access: left upper arm AVF- arm swollen   10/03/2015,9:33 AM  LOS: 2 days

## 2015-10-03 NOTE — Progress Notes (Signed)
Patient ID: [name], male   DOB: 06-30-1960, 55 y.o.   MRN: [id number]    PROGRESS NOTE    [name]  [id number] DOB: 06/11/1961 DOA: 10/01/2015  PCP: Myriam Jacobson, MD   Outpatient Specialists:   Brief Narrative:  55 y.o. male the CKD, s/p recent placement of AV fistula, had first HD 4/14, HTN, sleep apnea, presented to [facility] ED for evaluation of hemoptysis associated with several weeks duration of progressively worsening productive cough,  decreased appetite and a 15 pound unintentional weight loss over the last 4-6 weeks.  Assessment & Plan:   Principal Problem:   Hemoptysis - unclear etiology - pt started on doxycycline, will continue for now, day #3/7 - PCCM following, appreciate assistance, plan for FOB, likely next week, will defer to PCCM   Active Problems:   ESRD - had first HD 4/14, tolerated fairly well but some tachycardia which is still present, SVT - appreciate nephrology team following     Essential HTN - reasonably stable in the past 48 hours  - continue with home regimen Norvasc and Coreg    Anemia of chronic disease, ? Kidney dz - Hg 7.5, iron stores low, supplement  - CBC in AM    Morbid obesity du eto excess calories  - pt meets criteria with BMI > 35 and underlying HTN, kidney disease  - Body mass index is 36.53 kg/(m^2).  DVT prophylaxis: SCD's Code Status: Full  Family Communication: Patient and daughter at bedside  Disposition Plan: likely mid next week, based on clinical progress and once consultants clear   Consultants:   Nephrology  PCCM  Procedures:   None  Antimicrobials:   Doxycycline 4/14 -->  Subjective: Reports feeling better, less dyspnea this AM, no hemoptysis.   Objective: Filed Vitals:   10/02/15 1411 10/02/15 2030 10/03/15 0531 10/03/15 1217  BP: 114/76 129/77 138/81 126/82  Pulse: 129 78 77 82  Temp:  99.1 F (37.3 C) 98.9 F (37.2 C) 98.4 F (36.9 C)  TempSrc:  Oral Oral Oral  Resp: 18 18  18 18   Height:      Weight:   119.341 kg (263 lb 1.6 oz)   SpO2: 96% 97% 96% 98%    Intake/Output Summary (Last 24 hours) at 10/03/15 1300 Last data filed at 10/03/15 0957  Gross per 24 hour  Intake    240 ml  Output    652 ml  Net   -412 ml   Filed Weights   10/01/15 1830 10/02/15 0530 10/03/15 0531  Weight: 120.3 kg (265 lb 3.4 oz) 118.752 kg (261 lb 12.8 oz) 119.341 kg (263 lb 1.6 oz)    Examination:  General exam: Appears calm and comfortable  Respiratory system: Clear to auscultation. Respiratory effort normal. Cardiovascular system: S1 & S2 heard, tachycardic, rubs, gallops or clicks. No pedal edema. Gastrointestinal system: Abdomen is nondistended, soft and nontender.  Extremities: Symmetric 5 x 5 power.  Data Reviewed: I have personally reviewed following labs and imaging studies  CBC:  Recent Labs Lab 10/01/15 0418 10/01/15 0931 10/01/15 1137 10/01/15 1350 10/02/15 0213 10/03/15 0226  WBC 5.9 5.7  --   --  5.1 5.3  NEUTROABS 3.3  --   --   --   --   --   HGB 8.2* 7.7* 8.0* 7.6* 7.7* 7.5*  HCT 25.9* 24.9* 24.7* 24.3* 25.0* 24.1*  MCV 82.0 81.9  --   --  82.2 82.0  PLT 206 190  --   --  178 [id number]   Basic Metabolic Panel:  Recent Labs Lab 10/01/15 0418 10/01/15 0931 10/02/15 0213 10/03/15 0226  NA 141  --  141 140  K 3.8  --  3.6 3.7  CL 105  --  106 104  CO2 19*  --  20* 23  GLUCOSE 103*  --  96 102*  BUN 85*  --  62* 67*  CREATININE 16.90* 16.47* 13.19* 14.51*  CALCIUM 7.0*  --  7.3* 7.4*  MG  --  2.2  --   --   PHOS  --  7.0* 5.1* 5.5*   Liver Function Tests:  Recent Labs Lab 10/01/15 0931 10/02/15 0213 10/03/15 0226  ALBUMIN 3.1* 2.8* 2.8*   Coagulation Profile:  Recent Labs Lab 10/01/15 0606  INR 1.14   Anemia Panel:  Recent Labs  10/01/15 0931  VITAMINB12 5685*  FOLATE 8.0  FERRITIN 133  TIBC 220*  IRON 23*  RETICCTPCT 0.9   Urine analysis:    Component Value Date/Time   COLORURINE YELLOW 10/01/2015 1256    APPEARANCEUR CLEAR 10/01/2015 1256   LABSPEC 1.010 10/01/2015 1256   PHURINE 6.5 10/01/2015 1256   GLUCOSEU NEGATIVE 10/01/2015 1256   HGBUR MODERATE* 10/01/2015 1256   BILIRUBINUR NEGATIVE 10/01/2015 1256   KETONESUR NEGATIVE 10/01/2015 1256   PROTEINUR 100* 10/01/2015 1256   UROBILINOGEN 0.2 09/22/2014 1346   NITRITE NEGATIVE 10/01/2015 1256   LEUKOCYTESUR NEGATIVE 10/01/2015 1256   Radiology Studies: Dg Chest 2 View 10/01/2015  Patchy opacities in both lung bases, possibly infectious infiltrate versus hemorrhage. Unchanged cardiomegaly.   Ct Chest Wo Contrast 10/01/2015  Bibasilar airspace disease, at least partially alveolar hemorrhage given the history. Superimposed pneumonia cannot be excluded. No definitive cause of hemoptysis is seen. Given renal failure question pulmonary renal syndrome.   US Renal 10/01/2015  Echogenic kidneys are again noted consistent chronic medical renal disease. 2. No evidence of hydronephrosis or bladder distention. No acute abnormality identified.  Scheduled Meds: . amLODipine  5 mg Oral q morning - 10a  . calcitRIOL  0.5 mcg Oral Daily  . calcium acetate  1,334 mg Oral TID WC  . carvedilol  12.5 mg Oral Daily  . darbepoetin (ARANESP) injection - DIALYSIS  150 mcg Intravenous Q Sat-HD  . doxycycline  100 mg Oral Q12H  . ferric gluconate (FERRLECIT/NULECIT) IV  125 mg Intravenous Daily   Continuous Infusions:    LOS: 2 days   Time spent: 20 minutes   Faye Ramsay, MD [facility] Pager [phone]  If 7PM-7AM, please contact night-coverage [email or web address] Password [email or web address] 10/03/2015, 1:00 PM

## 2015-10-03 NOTE — Progress Notes (Signed)
Pt given 5 mg iv metoprolol for hr 130's with slowing to 70's sr

## 2015-10-04 DIAGNOSIS — G4733 Obstructive sleep apnea (adult) (pediatric): Secondary | ICD-10-CM

## 2015-10-04 DIAGNOSIS — N189 Chronic kidney disease, unspecified: Secondary | ICD-10-CM | POA: Insufficient documentation

## 2015-10-04 LAB — RENAL FUNCTION PANEL
ALBUMIN: 2.8 g/dL — AB (ref 3.5–5.0)
Anion gap: 13 (ref 5–15)
BUN: 70 mg/dL — ABNORMAL HIGH (ref 6–20)
CHLORIDE: 105 mmol/L (ref 101–111)
CO2: 24 mmol/L (ref 22–32)
CREATININE: 15.05 mg/dL — AB (ref 0.61–1.24)
Calcium: 7.9 mg/dL — ABNORMAL LOW (ref 8.9–10.3)
GFR, EST AFRICAN AMERICAN: 4 mL/min — AB (ref >60)
GFR, EST NON AFRICAN AMERICAN: 3 mL/min — AB (ref >60)
Glucose, Bld: 97 mg/dL (ref 65–99)
PHOSPHORUS: 5.5 mg/dL — AB (ref 2.5–4.6)
POTASSIUM: 3.8 mmol/L (ref 3.5–5.1)
Sodium: 142 mmol/L (ref 135–145)

## 2015-10-04 LAB — CBC
HEMATOCRIT: 24.2 % — AB (ref 39.0–52.0)
Hemoglobin: 7.5 g/dL — ABNORMAL LOW (ref 13.0–17.0)
MCH: 25.5 pg — ABNORMAL LOW (ref 26.0–34.0)
MCHC: 31 g/dL (ref 30.0–36.0)
MCV: 82.3 fL (ref 78.0–100.0)
PLATELETS: 191 10*3/uL (ref 150–400)
RBC: 2.94 MIL/uL — AB (ref 4.22–5.81)
RDW: 15.1 % (ref 11.5–15.5)
WBC: 6.8 10*3/uL (ref 4.0–10.5)

## 2015-10-04 LAB — GLOMERULAR BASEMENT MEMBRANE ANTIBODIES: GBM AB: 3 U (ref 0–20)

## 2015-10-04 MED ORDER — SODIUM CHLORIDE 0.9 % IV BOLUS (SEPSIS): 500.0000 mL | Freq: Once | INTRAVENOUS | Status: DC

## 2015-10-04 MED ORDER — SODIUM CHLORIDE 0.9 % IV BOLUS (SEPSIS)
250.0000 mL | Freq: Once | INTRAVENOUS | Status: AC
  Administered 2015-10-04: 250 mL via INTRAVENOUS

## 2015-10-04 NOTE — Procedures (Signed)
I have seen and examined this patient and agree with the plan of care .  Patient seen on dialysis He will need CLIP and outpatient dialysis  [facility] 10/04/2015, 10:11 AM

## 2015-10-04 NOTE — Progress Notes (Addendum)
Pt with temp 100.7, VSS otherwise. Blood cultures drawn 4/14 no growth. Tylenol ordered for temp >101. K Schorr notified and new order for 216ml NS bolus. Bolus completed in 1 hr, temp re-check 100.6. Pt states he does not want tylenol right now. K Schorr notified Will continue to monitor. Ronnette Hila, RN

## 2015-10-04 NOTE — Progress Notes (Signed)
Patient ID: [name], male   DOB: 08-20-1960, 55 y.o.   MRN: [id number]    PROGRESS NOTE    HARJAS CARE  [id number] DOB: 24-Jun-1960 DOA: 10/01/2015  PCP: Myriam Jacobson, MD   Outpatient Specialists:   Brief Narrative:  55 y.o. male the CKD, s/p recent placement of AV fistula, had first HD 4/14, HTN, sleep apnea, presented to [facility] ED for evaluation of hemoptysis associated with several weeks duration of progressively worsening productive cough,  decreased appetite and a 15 pound unintentional weight loss over the last 4-6 weeks.  Assessment & Plan:   Principal Problem:   Hemoptysis - unclear etiology - pt started on doxycycline, will continue for now, day #4/7 - PCCM following, appreciate assistance, no plan for FOB at this time   Active Problems:   ESRD - had first HD 4/14, tolerated fairly well but some tachycardia which is still present, SVT - appreciate nephrology team following     Essential HTN - reasonably stable in the past 48 hours  - continue with home regimen Norvasc and Coreg    Anemia of chronic disease, ? Kidney dz - Hg 7.5, iron stores low, supplement  - CBC in AM    Morbid obesity du eto excess calories  - pt meets criteria with BMI > 35 and underlying HTN, kidney disease  - Body mass index is 36.53 kg/(m^2).  DVT prophylaxis: SCD's Code Status: Full  Family Communication: Patient and daughter at bedside  Disposition Plan: likely mid next week, based on clinical progress and once consultants clear   Consultants:   Nephrology  PCCM  Procedures:   None  Antimicrobials:   Doxycycline 4/14 -->  Subjective: Reports feeling better, less dyspnea this AM, no hemoptysis.   Objective: Filed Vitals:   10/04/15 0927 10/04/15 0959 10/04/15 1025 10/04/15 1055  BP: 138/82 157/69 168/105 168/107  Pulse: 73 75 77 81  Temp:      TempSrc:      Resp: 20 12 20 15   Height:      Weight:      SpO2:        Intake/Output Summary (Last 24  hours) at 10/04/15 1107 Last data filed at 10/04/15 0830  Gross per 24 hour  Intake    700 ml  Output    900 ml  Net   -200 ml   Filed Weights   10/03/15 0531 10/04/15 0338 10/04/15 0800  Weight: 119.341 kg (263 lb 1.6 oz) 118.434 kg (261 lb 1.6 oz) 118.1 kg (260 lb 5.8 oz)    Examination:  General exam: Appears calm and comfortable  Respiratory system: Clear to auscultation. Respiratory effort normal. Cardiovascular system: S1 & S2 heard, tachycardic, rubs, gallops or clicks. No pedal edema. Gastrointestinal system: Abdomen is nondistended, soft and nontender.  Extremities: Symmetric 5 x 5 power.  Data Reviewed: I have personally reviewed following labs and imaging studies  CBC:  Recent Labs Lab 10/01/15 0418 10/01/15 0931 10/01/15 1137 10/01/15 1350 10/02/15 0213 10/03/15 0226 10/04/15 0208  WBC 5.9 5.7  --   --  5.1 5.3 6.8  NEUTROABS 3.3  --   --   --   --   --   --   HGB 8.2* 7.7* 8.0* 7.6* 7.7* 7.5* 7.5*  HCT 25.9* 24.9* 24.7* 24.3* 25.0* 24.1* 24.2*  MCV 82.0 81.9  --   --  82.2 82.0 82.3  PLT 206 190  --   --  178 182 191  Basic Metabolic Panel:  Recent Labs Lab 10/01/15 0418 10/01/15 0931 10/02/15 0213 10/03/15 0226 10/04/15 0215  NA 141  --  141 140 142  K 3.8  --  3.6 3.7 3.8  CL 105  --  106 104 105  CO2 19*  --  20* 23 24  GLUCOSE 103*  --  96 102* 97  BUN 85*  --  62* 67* 70*  CREATININE 16.90* 16.47* 13.19* 14.51* 15.05*  CALCIUM 7.0*  --  7.3* 7.4* 7.9*  MG  --  2.2  --   --   --   PHOS  --  7.0* 5.1* 5.5* 5.5*   Liver Function Tests:  Recent Labs Lab 10/01/15 0931 10/02/15 0213 10/03/15 0226 10/04/15 0215  ALBUMIN 3.1* 2.8* 2.8* 2.8*   Coagulation Profile:  Recent Labs Lab 10/01/15 0606  INR 1.14   Anemia Panel: No results for input(s): VITAMINB12, FOLATE, FERRITIN, TIBC, IRON, RETICCTPCT in the last 72 hours. Urine analysis:    Component Value Date/Time   COLORURINE YELLOW 10/01/2015 [place]  10/01/2015 1256   LABSPEC 1.010 10/01/2015 1256   PHURINE 6.5 10/01/2015 1256   GLUCOSEU NEGATIVE 10/01/2015 1256   HGBUR MODERATE* 10/01/2015 1256   BILIRUBINUR NEGATIVE 10/01/2015 1256   KETONESUR NEGATIVE 10/01/2015 1256   PROTEINUR 100* 10/01/2015 1256   UROBILINOGEN 0.2 09/22/2014 1346   NITRITE NEGATIVE 10/01/2015 1256   LEUKOCYTESUR NEGATIVE 10/01/2015 1256   Radiology Studies: Dg Chest 2 View 10/01/2015  Patchy opacities in both lung bases, possibly infectious infiltrate versus hemorrhage. Unchanged cardiomegaly.   Ct Chest Wo Contrast 10/01/2015  Bibasilar airspace disease, at least partially alveolar hemorrhage given the history. Superimposed pneumonia cannot be excluded. No definitive cause of hemoptysis is seen. Given renal failure question pulmonary renal syndrome.   US Renal 10/01/2015  Echogenic kidneys are again noted consistent chronic medical renal disease. 2. No evidence of hydronephrosis or bladder distention. No acute abnormality identified.  Scheduled Meds: . amLODipine  5 mg Oral q morning - 10a  . calcitRIOL  0.5 mcg Oral Daily  . calcium acetate  1,334 mg Oral TID WC  . carvedilol  12.5 mg Oral Daily  . darbepoetin (ARANESP) injection - DIALYSIS  150 mcg Intravenous Q Sat-HD  . doxycycline  100 mg Oral Q12H  . ferric gluconate (FERRLECIT/NULECIT) IV  125 mg Intravenous Daily   Continuous Infusions:    LOS: 3 days   Time spent: 20 minutes   Faye Ramsay, MD [facility] Pager [phone]  If 7PM-7AM, please contact night-coverage [email or web address] Password [email or web address] 10/04/2015, 11:07 AM

## 2015-10-04 NOTE — Progress Notes (Signed)
   Name: [name] MRN: [id number] DOB: 08-15-1960    ADMISSION DATE:  10/01/2015 CONSULTATION DATE:  4/14  REFERRING MD : [facility]  CHIEF COMPLAINT: hemoptysis   SIGNIFICANT EVENTS  4/14 hemoptysis  STUDIES:  CT chest 4/14  HISTORY OF PRESENT ILLNESS:   [name] is a 55 yo ,obese(270 lbs) with long standing renal dysfunction but not on HD as of yet who oresents with one episode of 20 cc of bright red hemoptysis 4/14 but reports tasting blood for a week and notes his breath has foul odor. He has never smoked or had hemoptysis in the past. Denies fever , chills, sweats or other malaise.  SUBJECTIVE:  Sitting up in bed No further episodes of hemoptysis   VITAL SIGNS: Temp:  [97.6 F (36.4 C)-99.8 F (37.7 C)] 99.8 F (37.7 C) (04/17 1421) Pulse Rate:  [73-90] 81 (04/17 1421) Resp:  [12-20] 20 (04/17 1107) BP: (108-170)/(37-109) 155/91 mmHg (04/17 1421) SpO2:  [96 %-100 %] 96 % (04/17 1421) Weight:  [258 lb 9.6 oz (117.3 kg)-261 lb 1.6 oz (118.434 kg)] 258 lb 9.6 oz (117.3 kg) (04/17 1107)  PHYSICAL EXAMINATION: General:  WNWDAAM on no distress Neuro: Intact HEENT:  No JVD/LAN Cardiovascular:  HSR RRR Lungs: CTA Abdomen:  Obese + bs Musculoskeletal:  intact Skin:  Warm   Recent Labs Lab 10/02/15 0213 10/03/15 0226 10/04/15 0215  NA 141 140 142  K 3.6 3.7 3.8  CL 106 104 105  CO2 20* 23 24  BUN 62* 67* 70*  CREATININE 13.19* 14.51* 15.05*  GLUCOSE 96 102* 97    Recent Labs Lab 10/02/15 0213 10/03/15 0226 10/04/15 0208  HGB 7.7* 7.5* 7.5*  HCT 25.0* 24.1* 24.2*  WBC 5.1 5.3 6.8  PLT 178 182 191   No results found.  ASSESSMENT:    Hemoptysis. One episode 20 cc bright red blood 4/14   Weakness generalized, suspect due to uremia   Essential hypertension, benign   CKD (chronic kidney disease) stage 3, GFR 30-59 ml/min now progressed to stage 5, creatinine 16, to start HD 4/14   Prostate cancer [facility]) radical prostatectomy 2015    Anemia  Unintentional weight loss  Discussion: He underwent dialysis today, he has not had any further hemoptysis ANCA negative  PLAN: Observe for now, doubt bronchoscopy necessary Plan is for him to undergo another session of hemodialysis tomorrow  He will need follow-up imaging in 2-3 weeks to ensure resolution of infiltrates on chest x-ray If he has further episodes of hemoptysis we'll pursue bronchoscopy   Kara Mead MD. FCCP. [facility] Pager [phone] If no response call [phone]      10/04/2015, 5:56 PM

## 2015-10-05 DIAGNOSIS — D649 Anemia, unspecified: Secondary | ICD-10-CM

## 2015-10-05 LAB — RENAL FUNCTION PANEL
ALBUMIN: 2.9 g/dL — AB (ref 3.5–5.0)
ANION GAP: 12 (ref 5–15)
BUN: 50 mg/dL — ABNORMAL HIGH (ref 6–20)
CALCIUM: 8.3 mg/dL — AB (ref 8.9–10.3)
CO2: 26 mmol/L (ref 22–32)
Chloride: 103 mmol/L (ref 101–111)
Creatinine, Ser: 12.18 mg/dL — ABNORMAL HIGH (ref 0.61–1.24)
GFR, EST AFRICAN AMERICAN: 5 mL/min — AB (ref >60)
GFR, EST NON AFRICAN AMERICAN: 4 mL/min — AB (ref >60)
GLUCOSE: 98 mg/dL (ref 65–99)
PHOSPHORUS: 4.6 mg/dL (ref 2.5–4.6)
Potassium: 3.9 mmol/L (ref 3.5–5.1)
SODIUM: 141 mmol/L (ref 135–145)

## 2015-10-05 LAB — CBC
HCT: 26.1 % — ABNORMAL LOW (ref 39.0–52.0)
HEMOGLOBIN: 8.1 g/dL — AB (ref 13.0–17.0)
MCH: 25.9 pg — ABNORMAL LOW (ref 26.0–34.0)
MCHC: 31 g/dL (ref 30.0–36.0)
MCV: 83.4 fL (ref 78.0–100.0)
Platelets: 197 10*3/uL (ref 150–400)
RBC: 3.13 MIL/uL — AB (ref 4.22–5.81)
RDW: 15.3 % (ref 11.5–15.5)
WBC: 8.4 10*3/uL (ref 4.0–10.5)

## 2015-10-05 LAB — ANCA TITERS: Atypical P-ANCA titer: <1:20 {titer}

## 2015-10-05 NOTE — Progress Notes (Signed)
Patient ID: [name], male   DOB: 23-Jan-1961, 55 y.o.   MRN: [id number]    PROGRESS NOTE    [name]  [id number] DOB: May 19, 1961 DOA: 10/01/2015  PCP: Myriam Jacobson, MD   Outpatient Specialists: none so far but will be set up with nephrologist and pulmonologist if needed   Brief Narrative:  55 y.o. male the CKD, s/p recent placement of AV fistula, had first HD 4/14, HTN, sleep apnea, presented to [facility] ED for evaluation of hemoptysis associated with several weeks duration of progressively worsening productive cough,  decreased appetite and a 15 pound unintentional weight loss over the last 4-6 weeks.  Assessment & Plan:   Principal Problem:   Fever overnight 4/17, Tmax 100.37F - unclear etiology - currently on doxycycline, will continue same regimen - no fever this AM, pt did not want tylenol - continue to monitor, WBC is WNL     Hemoptysis - unclear etiology but seems to be resolved at this point  - pt started on doxycycline, will continue for now, day #5/7 - PCCM following, appreciate assistance, no plan for FOB at this time   Active Problems:   ESRD - appreciate nephrology team following  - pt tolerating well so far     Essential HTN - reasonably stable since admission  - continue with home regimen Norvasc and Coreg    Anemia of chronic disease, ? Kidney dz - Hg 8.1, iron stores low, supplement     Morbid obesity du eto excess calories  - pt meets criteria with BMI > 35 and underlying HTN, kidney disease  - Body mass index is 36.53 kg/(m^2).  DVT prophylaxis: SCD's Code Status: Full  Family Communication: Patient and daughter at bedside  Disposition Plan: likely by 4/19 or 4/20 in nephrology and pulmonology team OK with d/c   Consultants:   Nephrology  PCCM  Procedures:   None  Antimicrobials:   Doxycycline 4/14 --> 4/20   Subjective: Reports feeling better, less dyspnea this AM, no hemoptysis.   Objective: Filed Vitals:   10/05/15 0012 10/05/15 0541 10/05/15 0640 10/05/15 1141  BP:  141/80  137/72  Pulse:  78  73  Temp: 100.7 F (38.2 C) 98.7 F (37.1 C)  98.5 F (36.9 C)  TempSrc: Oral Oral  Oral  Resp:  19  18  Height:      Weight:   116.2 kg (256 lb 2.8 oz)   SpO2:  98%  100%    Intake/Output Summary (Last 24 hours) at 10/05/15 1333 Last data filed at 10/05/15 0854  Gross per 24 hour  Intake    480 ml  Output   1025 ml  Net   -545 ml   Filed Weights   10/04/15 0800 10/04/15 1107 10/05/15 0640  Weight: 118.1 kg (260 lb 5.8 oz) 117.3 kg (258 lb 9.6 oz) 116.2 kg (256 lb 2.8 oz)    Examination:  General exam: Appears calm and comfortable  Respiratory system: Clear to auscultation. Respiratory effort normal. Cardiovascular system: S1 & S2 heard, tachycardic, rubs, gallops or clicks. No pedal edema. Gastrointestinal system: Abdomen is nondistended, soft and nontender.  Extremities: Symmetric 5 x 5 power.  Data Reviewed: I have personally reviewed following labs and imaging studies  CBC:  Recent Labs Lab 10/01/15 0418 10/01/15 0931  10/01/15 1350 10/02/15 0213 10/03/15 0226 10/04/15 0208 10/05/15 0422  WBC 5.9 5.7  --   --  5.1 5.3 6.8 8.4  NEUTROABS 3.3  --   --   --   --   --   --   --  HGB 8.2* 7.7*  < > 7.6* 7.7* 7.5* 7.5* 8.1*  HCT 25.9* 24.9*  < > 24.3* 25.0* 24.1* 24.2* 26.1*  MCV 82.0 81.9  --   --  82.2 82.0 82.3 83.4  PLT 206 190  --   --  178 182 191 197  < > = values in this interval not displayed. Basic Metabolic Panel:  Recent Labs Lab 10/01/15 0418 10/01/15 0931 10/02/15 0213 10/03/15 0226 10/04/15 0215 10/05/15 0422  NA 141  --  141 140 142 141  K 3.8  --  3.6 3.7 3.8 3.9  CL 105  --  106 104 105 103  CO2 19*  --  20* 23 24 26   GLUCOSE 103*  --  96 102* 97 98  BUN 85*  --  62* 67* 70* 50*  CREATININE 16.90* 16.47* 13.19* 14.51* 15.05* 12.18*  CALCIUM 7.0*  --  7.3* 7.4* 7.9* 8.3*  MG  --  2.2  --   --   --   --   PHOS  --  7.0* 5.1* 5.5* 5.5* 4.6    Liver Function Tests:  Recent Labs Lab 10/01/15 0931 10/02/15 0213 10/03/15 0226 10/04/15 0215 10/05/15 0422  ALBUMIN 3.1* 2.8* 2.8* 2.8* 2.9*   Coagulation Profile:  Recent Labs Lab 10/01/15 0606  INR 1.14   Anemia Panel: No results for input(s): VITAMINB12, FOLATE, FERRITIN, TIBC, IRON, RETICCTPCT in the last 72 hours. Urine analysis:    Component Value Date/Time   COLORURINE YELLOW 10/01/2015 [place] 10/01/2015 1256   LABSPEC 1.010 10/01/2015 1256   PHURINE 6.5 10/01/2015 1256   GLUCOSEU NEGATIVE 10/01/2015 1256   HGBUR MODERATE* 10/01/2015 1256   BILIRUBINUR NEGATIVE 10/01/2015 1256   KETONESUR NEGATIVE 10/01/2015 1256   PROTEINUR 100* 10/01/2015 1256   UROBILINOGEN 0.2 09/22/2014 1346   NITRITE NEGATIVE 10/01/2015 1256   LEUKOCYTESUR NEGATIVE 10/01/2015 1256   Radiology Studies: Dg Chest 2 View 10/01/2015  Patchy opacities in both lung bases, possibly infectious infiltrate versus hemorrhage. Unchanged cardiomegaly.   Ct Chest Wo Contrast 10/01/2015  Bibasilar airspace disease, at least partially alveolar hemorrhage given the history. Superimposed pneumonia cannot be excluded. No definitive cause of hemoptysis is seen. Given renal failure question pulmonary renal syndrome.   US Renal 10/01/2015  Echogenic kidneys are again noted consistent chronic medical renal disease. 2. No evidence of hydronephrosis or bladder distention. No acute abnormality identified.  Scheduled Meds: . amLODipine  5 mg Oral q morning - 10a  . calcitRIOL  0.5 mcg Oral Daily  . calcium acetate  1,334 mg Oral TID WC  . carvedilol  12.5 mg Oral Daily  . darbepoetin (ARANESP) injection - DIALYSIS  150 mcg Intravenous Q Sat-HD  . doxycycline  100 mg Oral Q12H  . ferric gluconate (FERRLECIT/NULECIT) IV  125 mg Intravenous Daily   Continuous Infusions:    LOS: 4 days   Time spent: 20 minutes   Faye Ramsay, MD [facility] Pager [phone]  If  7PM-7AM, please contact night-coverage [email or web address] Password [email or web address] 10/05/2015, 1:33 PM

## 2015-10-05 NOTE — Progress Notes (Signed)
10/05/2015 8:08 AM. Hemodialysis Outpatient Note; this patient has been accepted at the [facility] center on a Monday, Wednesday and Friday 2nd shift schedule. The center can begin treatment on Wednesday April 19. The center is aware of [name] preference for 1st shift and will move him when a spot becomes available. Thank you. Gordy Savers

## 2015-10-05 NOTE — Care Management Note (Addendum)
Case Management Note  Patient Details  Name: [name] MRN: [id number] Date of Birth: [birth year]  Subjective/Objective:  Hemoptysis, CKD (new dialysis), anemia, receiving dialysis this admission                  Action/Plan: Discharge Planning:   Chart reviewed. Lives at home with wife, [name] # [phone]. Pt's outpatient dialysis Center, [facility] on MWF. Plans to apply for his disability. Will continue to follow for dc needs.   PCP- Lorene Dy MD  Expected Discharge Date:                  Expected Discharge Plan:  Home/Self Care  In-House Referral:  NA  Discharge planning Services  CM Consult  Post Acute Care Choice:  NA Choice offered to:  NA  DME Arranged:  N/A DME Agency:  NA  HH Arranged:  NA HH Agency:  NA  Status of Service:  In process, will continue to follow  Medicare Important Message Given:    Date Medicare IM Given:    Medicare IM give by:    Date Additional Medicare IM Given:    Additional Medicare Important Message give by:     If discussed at [place] of Stay Meetings, dates discussed:    Additional Comments:  Erenest Rasher, RN 10/05/2015, 4:29 PM

## 2015-10-05 NOTE — Progress Notes (Signed)
   Name: [name] MRN: [id number] DOB: April 09, 1961    ADMISSION DATE:  10/01/2015 CONSULTATION DATE:  4/14  REFERRING MD : [facility]  CHIEF COMPLAINT: hemoptysis   SIGNIFICANT EVENTS  4/14 hemoptysis  STUDIES:  CT chest 4/14  HISTORY OF PRESENT ILLNESS:   [name] is a 55 yo ,obese(270 lbs) with long standing renal dysfunction but not on HD as of yet who oresents with one episode of 20 cc of bright red hemoptysis 4/14 but reports tasting blood for a week and notes his breath has foul odor. He has never smoked or had hemoptysis in the past. Denies fever , chills, sweats or other malaise.  SUBJECTIVE:  Afebrile, no dyspnea No further episodes of hemoptysis   VITAL SIGNS: Temp:  [98.5 F (36.9 C)-100.7 F (38.2 C)] 98.5 F (36.9 C) (04/18 1141) Pulse Rate:  [73-88] 73 (04/18 1141) Resp:  [18-19] 18 (04/18 1141) BP: (137-155)/(72-91) 137/72 mmHg (04/18 1141) SpO2:  [96 %-100 %] 100 % (04/18 1141) Weight:  [256 lb 2.8 oz (116.2 kg)] 256 lb 2.8 oz (116.2 kg) (04/18 0640)  PHYSICAL EXAMINATION: General:  WNWDAAM on no distress Neuro: Intact HEENT:  No JVD/LAN Cardiovascular:  HSR RRR Lungs: CTA Abdomen:  Obese + bs Musculoskeletal:  intact Skin:  Warm   Recent Labs Lab 10/03/15 0226 10/04/15 0215 10/05/15 0422  NA 140 142 141  K 3.7 3.8 3.9  CL 104 105 103  CO2 23 24 26   BUN 67* 70* 50*  CREATININE 14.51* 15.05* 12.18*  GLUCOSE 102* 97 98    Recent Labs Lab 10/03/15 0226 10/04/15 0208 10/05/15 0422  HGB 7.5* 7.5* 8.1*  HCT 24.1* 24.2* 26.1*  WBC 5.3 6.8 8.4  PLT 182 191 197   No results found.  ASSESSMENT:    Hemoptysis. One episode 20 cc bright red blood 4/14   Weakness generalized, suspect due to uremia   Essential hypertension, benign   CKD (chronic kidney disease) stage 3, GFR 30-59 ml/min now progressed to stage 5, creatinine 16, to start HD 4/14   Prostate cancer [facility]) radical prostatectomy 2015    Anemia   Unintentional weight  loss  Discussion: He underwent dialysis today, he has not had any further hemoptysis ANCA negative  PLAN:  Second session of hemodialysis planned  He will need follow-up CXR in 3-4 weeks to ensure resolution of infiltrates  If he has further episodes of hemoptysis we'll pursue bronchoscopy  PCCM available as needed   Kara Mead MD. FCCP. [place] [facility] Pager [phone] If no response call [phone]      10/05/2015, 1:34 PM

## 2015-10-05 NOTE — Progress Notes (Signed)
Subjective:  Had HD yesterday. Feeling a little better. Cough improved  Objective Vital signs in last 24 hours: Filed Vitals:   10/05/15 0012 10/05/15 0541 10/05/15 0640 10/05/15 1141  BP:  141/80  137/72  Pulse:  78  73  Temp: 100.7 F (38.2 C) 98.7 F (37.1 C)  98.5 F (36.9 C)  TempSrc: Oral Oral  Oral  Resp:  19  18  Height:      Weight:   256 lb 2.8 oz (116.2 kg)   SpO2:  98%  100%   Weight change: -11.8 oz (-0.334 kg)  Intake/Output Summary (Last 24 hours) at 10/05/15 1251 Last data filed at 10/05/15 0854  Gross per 24 hour  Intake    480 ml  Output   1025 ml  Net   -545 ml     Assessment/Plan: 54 year old black male with progressive renal insufficiency now with uremic symptoms  1.Renal- advanced and progressive chronic kidney disease. He is now at ESRD. Will look for outpatient dialysisarrangements; Tolerating sessions well. Plan for next HD Wednesday 4/19. 2.Hemoptysis - Workup per primary team - placed on doxycycline  3. HTN/volume-  BP well controled. UPO 1.3L in 24 hrs. Possibly presented slightly volume overloaded. Continue Norvasc and Coreg  4. Anemia - doesn't appear to have been on ESA as outpatient. Hemoglobin improved at 8.1. Iron stores low- replacing and ESA started 5. Bones- PTH over 500-  cacitriol and phoslo    Georges Lynch    Labs: Basic Metabolic Panel:  Recent Labs Lab 10/03/15 0226 10/04/15 0215 10/05/15 0422  NA 140 142 141  K 3.7 3.8 3.9  CL 104 105 103  CO2 23 24 26   GLUCOSE 102* 97 98  BUN 67* 70* 50*  CREATININE 14.51* 15.05* 12.18*  CALCIUM 7.4* 7.9* 8.3*  PHOS 5.5* 5.5* 4.6   Liver Function Tests:  Recent Labs Lab 10/03/15 0226 10/04/15 0215 10/05/15 0422  ALBUMIN 2.8* 2.8* 2.9*   No results for input(s): LIPASE, AMYLASE in the last 168 hours. No results for input(s): AMMONIA in the last 168 hours. CBC:  Recent Labs Lab 10/01/15 0418 10/01/15 0931  10/02/15 0213 10/03/15 0226 10/04/15 0208 10/05/15 0422   WBC 5.9 5.7  --  5.1 5.3 6.8 8.4  NEUTROABS 3.3  --   --   --   --   --   --   HGB 8.2* 7.7*  < > 7.7* 7.5* 7.5* 8.1*  HCT 25.9* 24.9*  < > 25.0* 24.1* 24.2* 26.1*  MCV 82.0 81.9  --  82.2 82.0 82.3 83.4  PLT 206 190  --  178 182 191 197  < > = values in this interval not displayed. Cardiac Enzymes: No results for input(s): CKTOTAL, CKMB, CKMBINDEX, TROPONINI in the last 168 hours. CBG: No results for input(s): GLUCAP in the last 168 hours.  Iron Studies:  No results for input(s): IRON, TIBC, TRANSFERRIN, FERRITIN in the last 72 hours. Studies/Results: No results found. Medications: Infusions:    Scheduled Medications: . amLODipine  5 mg Oral q morning - 10a  . calcitRIOL  0.5 mcg Oral Daily  . calcium acetate  1,334 mg Oral TID WC  . carvedilol  12.5 mg Oral Daily  . darbepoetin (ARANESP) injection - DIALYSIS  150 mcg Intravenous Q Sat-HD  . doxycycline  100 mg Oral Q12H  . ferric gluconate (FERRLECIT/NULECIT) IV  125 mg Intravenous Daily    have reviewed scheduled and prn medications.  Physical Exam: General: NAD Heart: RRR  Lungs: mostly clear Abdomen: soft, non tender Extremities: minimal edema Dialysis Access: left upper arm AVF- arm swollen   10/05/2015,12:51 PM  LOS: 4 days

## 2015-10-05 NOTE — Progress Notes (Signed)
Pt states he wants to get some sleep tonight and requesting to not be bothered for a few hours.

## 2015-10-06 ENCOUNTER — Inpatient Hospital Stay (HOSPITAL_COMMUNITY): Payer: Commercial Managed Care - HMO

## 2015-10-06 LAB — CBC WITH DIFFERENTIAL/PLATELET
BASOS PCT: 0 %
Basophils Absolute: 0 10*3/uL (ref 0.0–0.1)
EOS ABS: 0.2 10*3/uL (ref 0.0–0.7)
EOS PCT: 2 %
HCT: 25.7 % — ABNORMAL LOW (ref 39.0–52.0)
HEMOGLOBIN: 8 g/dL — AB (ref 13.0–17.0)
LYMPHS ABS: 2.1 10*3/uL (ref 0.7–4.0)
Lymphocytes Relative: 22 %
MCH: 26.1 pg (ref 26.0–34.0)
MCHC: 31.1 g/dL (ref 30.0–36.0)
MCV: 84 fL (ref 78.0–100.0)
MONOS PCT: 15 %
Monocytes Absolute: 1.5 10*3/uL — ABNORMAL HIGH (ref 0.1–1.0)
NEUTROS PCT: 61 %
Neutro Abs: 6 10*3/uL (ref 1.7–7.7)
PLATELETS: 210 10*3/uL (ref 150–400)
RBC: 3.06 MIL/uL — AB (ref 4.22–5.81)
RDW: 15.6 % — ABNORMAL HIGH (ref 11.5–15.5)
WBC: 9.8 10*3/uL (ref 4.0–10.5)

## 2015-10-06 LAB — LACTIC ACID, PLASMA
Lactic Acid, Venous: 0.9 mmol/L (ref 0.5–2.0)
Lactic Acid, Venous: 1 mmol/L (ref 0.5–2.0)

## 2015-10-06 LAB — CULTURE, BLOOD (ROUTINE X 2)
Culture: NO GROWTH
Culture: NO GROWTH

## 2015-10-06 LAB — RENAL FUNCTION PANEL
ALBUMIN: 2.9 g/dL — AB (ref 3.5–5.0)
ANION GAP: 14 (ref 5–15)
Albumin: 2.9 g/dL — ABNORMAL LOW (ref 3.5–5.0)
Anion gap: 15 (ref 5–15)
BUN: 57 mg/dL — ABNORMAL HIGH (ref 6–20)
BUN: 60 mg/dL — AB (ref 6–20)
CALCIUM: 8.7 mg/dL — AB (ref 8.9–10.3)
CALCIUM: 8.6 mg/dL — AB (ref 8.9–10.3)
CO2: 24 mmol/L (ref 22–32)
CO2: 23 mmol/L (ref 22–32)
CREATININE: 13.52 mg/dL — AB (ref 0.61–1.24)
Chloride: 102 mmol/L (ref 101–111)
Chloride: 101 mmol/L (ref 101–111)
Creatinine, Ser: 13.71 mg/dL — ABNORMAL HIGH (ref 0.61–1.24)
GFR calc Af Amer: 4 mL/min — ABNORMAL LOW (ref >60)
GFR calc Af Amer: 4 mL/min — ABNORMAL LOW (ref >60)
GFR calc non Af Amer: 4 mL/min — ABNORMAL LOW (ref >60)
GFR calc non Af Amer: 3 mL/min — ABNORMAL LOW (ref >60)
GLUCOSE: 98 mg/dL (ref 65–99)
GLUCOSE: 142 mg/dL — AB (ref 65–99)
PHOSPHORUS: 4.9 mg/dL — AB (ref 2.5–4.6)
PHOSPHORUS: 4.4 mg/dL (ref 2.5–4.6)
Potassium: 4.1 mmol/L (ref 3.5–5.1)
Potassium: 3.9 mmol/L (ref 3.5–5.1)
SODIUM: 140 mmol/L (ref 135–145)
SODIUM: 139 mmol/L (ref 135–145)

## 2015-10-06 LAB — CBC
HEMATOCRIT: 25.3 % — AB (ref 39.0–52.0)
Hemoglobin: 7.8 g/dL — ABNORMAL LOW (ref 13.0–17.0)
MCH: 25.7 pg — ABNORMAL LOW (ref 26.0–34.0)
MCHC: 30.8 g/dL (ref 30.0–36.0)
MCV: 83.5 fL (ref 78.0–100.0)
Platelets: 235 10*3/uL (ref 150–400)
RBC: 3.03 MIL/uL — ABNORMAL LOW (ref 4.22–5.81)
RDW: 15.5 % (ref 11.5–15.5)
WBC: 10.2 10*3/uL (ref 4.0–10.5)

## 2015-10-06 LAB — GLUCOSE, CAPILLARY: GLUCOSE-CAPILLARY: 113 mg/dL — AB (ref 65–99)

## 2015-10-06 LAB — PROCALCITONIN: PROCALCITONIN: 0.48 ng/mL

## 2015-10-06 MED ORDER — SODIUM CHLORIDE 0.9 % IV SOLN: 100.0000 mL | INTRAVENOUS | Status: DC | PRN

## 2015-10-06 MED ORDER — PENTAFLUOROPROP-TETRAFLUOROETH EX AERO: 1.0000 "application " | INHALATION_SPRAY | CUTANEOUS | Status: DC | PRN

## 2015-10-06 MED ORDER — ALTEPLASE 2 MG IJ SOLR: 2.0000 mg | Freq: Once | INTRAMUSCULAR | Status: DC | PRN

## 2015-10-06 MED ORDER — HEPARIN SODIUM (PORCINE) 1000 UNIT/ML DIALYSIS
1000.0000 [IU] | INTRAMUSCULAR | Status: DC | PRN
  Filled 2015-10-06: qty 1

## 2015-10-06 MED ORDER — LIDOCAINE HCL (PF) 1 % IJ SOLN: 5.0000 mL | INTRAMUSCULAR | Status: DC | PRN

## 2015-10-06 MED ORDER — LIDOCAINE-PRILOCAINE 2.5-2.5 % EX CREA: 1.0000 "application " | TOPICAL_CREAM | CUTANEOUS | Status: DC | PRN

## 2015-10-06 MED ORDER — MORPHINE SULFATE (PF) 2 MG/ML IV SOLN
INTRAVENOUS | Status: AC
  Administered 2015-10-06: 1 mg via INTRAVENOUS
  Filled 2015-10-06: qty 1

## 2015-10-06 MED ORDER — PROCHLORPERAZINE EDISYLATE 5 MG/ML IJ SOLN
10.0000 mg | Freq: Four times a day (QID) | INTRAMUSCULAR | Status: DC | PRN
  Administered 2015-10-07: 10 mg via INTRAVENOUS
  Filled 2015-10-06 (×2): qty 2

## 2015-10-06 NOTE — Progress Notes (Signed)
Patient ID: [name], male   DOB: 06/29/1960, 55 y.o.   MRN: [id number]    PROGRESS NOTE    KAIKOA Trevino  [id number] DOB: 01-03-1961 DOA: 10/01/2015  PCP: Myriam Jacobson, MD   Outpatient Specialists: none so far but will be set up with nephrologist and pulmonologist if needed   Brief Narrative:  55 y.o. male the CKD, s/p recent placement of AV fistula, had first HD 4/14, HTN, sleep apnea, presented to [facility] ED for evaluation of hemoptysis associated with several weeks duration of progressively worsening productive cough,  decreased appetite and a 15 pound unintentional weight loss over the last 4-6 weeks.  Assessment & Plan:   Principal Problem:   Fever overnight 4/17, Tmax 100.6 F - unclear etiology - currently on doxycycline, will continue same regimen for now  - repeat CXR, blood cultures requested, procalcitonin and lactic acid - may need broader coverage  - LUE swelling, requested doppler to rule out DVT    Hemoptysis - unclear etiology but seems to be resolved at this point  - pt started on doxycycline, will continue for now, day #6/7 - no plan for FOB at this time   Active Problems:   ESRD - appreciate nephrology team following  - pt tolerating well so far     Essential HTN - reasonably stable since admission  - continue with home regimen Norvasc and Coreg    Anemia of chronic disease, ? Kidney dz - Hg 8.0, iron stores low, supplement     Morbid obesity due to excess calories  - pt meets criteria with BMI > 35 and underlying HTN, kidney disease  - Body mass index is 36.53 kg/(m^2).  DVT prophylaxis: SCD's Code Status: Full  Family Communication: Patient and daughter at bedside  Disposition Plan: likely by 4/20  Consultants:   Nephrology  PCCM  Procedures:   None  Antimicrobials:   Doxycycline 4/14 --> 4/20   Subjective: Reports feeling sluggish, chills and sweats.   Objective: Filed Vitals:   10/05/15 0640 10/05/15 1141  10/05/15 2257 10/06/15 0500  BP:  137/72 156/85 156/91  Pulse:  73 85 92  Temp:  98.5 F (36.9 C) 100.6 F (38.1 C) 99.9 F (37.7 C)  TempSrc:  Oral Oral Oral  Resp:  18 19 20   Height:      Weight: 116.2 kg (256 lb 2.8 oz)   115.758 kg (255 lb 3.2 oz)  SpO2:  100% 100% 100%    Intake/Output Summary (Last 24 hours) at 10/06/15 0736 Last data filed at 10/06/15 0559  Gross per 24 hour  Intake    360 ml  Output    225 ml  Net    135 ml   Filed Weights   10/04/15 1107 10/05/15 0640 10/06/15 0500  Weight: 117.3 kg (258 lb 9.6 oz) 116.2 kg (256 lb 2.8 oz) 115.758 kg (255 lb 3.2 oz)    Examination:  General exam: Appears calm and comfortable  Respiratory system: Clear to auscultation. Respiratory effort normal. Cardiovascular system: S1 & S2 heard, no rubs, gallops or clicks. No pedal edema. Gastrointestinal system: Abdomen is nondistended, soft and nontender.  Extremities: Symmetric 5 x 5 power. LUE swelling   Data Reviewed: I have personally reviewed following labs and imaging studies  CBC:  Recent Labs Lab 10/01/15 0418 10/01/15 0931  10/01/15 1350 10/02/15 0213 10/03/15 0226 10/04/15 0208 10/05/15 0422  WBC 5.9 5.7  --   --  5.1 5.3 6.8 8.4  NEUTROABS  3.3  --   --   --   --   --   --   --   HGB 8.2* 7.7*  < > 7.6* 7.7* 7.5* 7.5* 8.1*  HCT 25.9* 24.9*  < > 24.3* 25.0* 24.1* 24.2* 26.1*  MCV 82.0 81.9  --   --  82.2 82.0 82.3 83.4  PLT 206 190  --   --  178 182 191 197  < > = values in this interval not displayed. Basic Metabolic Panel:  Recent Labs Lab 10/01/15 0418 10/01/15 0931 10/02/15 0213 10/03/15 0226 10/04/15 0215 10/05/15 0422  NA 141  --  141 140 142 141  K 3.8  --  3.6 3.7 3.8 3.9  CL 105  --  106 104 105 103  CO2 19*  --  20* 23 24 26   GLUCOSE 103*  --  96 102* 97 98  BUN 85*  --  62* 67* 70* 50*  CREATININE 16.90* 16.47* 13.19* 14.51* 15.05* 12.18*  CALCIUM 7.0*  --  7.3* 7.4* 7.9* 8.3*  MG  --  2.2  --   --   --   --   PHOS  --  7.0*  5.1* 5.5* 5.5* 4.6   Liver Function Tests:  Recent Labs Lab 10/01/15 0931 10/02/15 0213 10/03/15 0226 10/04/15 0215 10/05/15 0422  ALBUMIN 3.1* 2.8* 2.8* 2.8* 2.9*   Coagulation Profile:  Recent Labs Lab 10/01/15 0606  INR 1.14   Urine analysis:    Component Value Date/Time   COLORURINE YELLOW 10/01/2015 1256   APPEARANCEUR CLEAR 10/01/2015 1256   LABSPEC 1.010 10/01/2015 1256   PHURINE 6.5 10/01/2015 1256   GLUCOSEU NEGATIVE 10/01/2015 1256   HGBUR MODERATE* 10/01/2015 1256   BILIRUBINUR NEGATIVE 10/01/2015 1256   KETONESUR NEGATIVE 10/01/2015 1256   PROTEINUR 100* 10/01/2015 1256   UROBILINOGEN 0.2 09/22/2014 1346   NITRITE NEGATIVE 10/01/2015 1256   LEUKOCYTESUR NEGATIVE 10/01/2015 1256   Radiology Studies: Dg Chest 2 View 10/01/2015  Patchy opacities in both lung bases, possibly infectious infiltrate versus hemorrhage. Unchanged cardiomegaly.   Ct Chest Wo Contrast 10/01/2015  Bibasilar airspace disease, at least partially alveolar hemorrhage given the history. Superimposed pneumonia cannot be excluded. No definitive cause of hemoptysis is seen. Given renal failure question pulmonary renal syndrome.   US Renal 10/01/2015  Echogenic kidneys are again noted consistent chronic medical renal disease. 2. No evidence of hydronephrosis or bladder distention. No acute abnormality identified.  Scheduled Meds: . amLODipine  5 mg Oral q morning - 10a  . calcitRIOL  0.5 mcg Oral Daily  . calcium acetate  1,334 mg Oral TID WC  . carvedilol  12.5 mg Oral Daily  . darbepoetin (ARANESP) injection - DIALYSIS  150 mcg Intravenous Q Sat-HD  . doxycycline  100 mg Oral Q12H  . ferric gluconate (FERRLECIT/NULECIT) IV  125 mg Intravenous Daily   Continuous Infusions:    LOS: 5 days   Time spent: 20 minutes   Faye Ramsay, MD [facility] Pager [phone]  If 7PM-7AM, please contact night-coverage [email or web address] Password [facility] 10/06/2015, 7:36 AM

## 2015-10-06 NOTE — Progress Notes (Signed)
Subjective:  Planning for HD today. Feeling a little better. Cough improved  Objective Vital signs in last 24 hours: Filed Vitals:   10/05/15 0640 10/05/15 1141 10/05/15 2257 10/06/15 0500  BP:  137/72 156/85 156/91  Pulse:  73 85 92  Temp:  98.5 F (36.9 C) 100.6 F (38.1 C) 99.9 F (37.7 C)  TempSrc:  Oral Oral Oral  Resp:  18 19 20   Height:      Weight: 256 lb 2.8 oz (116.2 kg)   255 lb 3.2 oz (115.758 kg)  SpO2:  100% 100% 100%   Weight change: -5 lb 2.6 oz (-2.342 kg)  Intake/Output Summary (Last 24 hours) at 10/06/15 1131 Last data filed at 10/06/15 0855  Gross per 24 hour  Intake    480 ml  Output      0 ml  Net    480 ml     Assessment/Plan: 55 year old black male with progressive renal insufficiency now with uremic symptoms  1.Renal- advanced and progressive chronic kidney disease. He is now at ESRD. Will look for outpatient dialysisarrangements; Tolerating sessions well. Plan for next HD today 4/19. 2.Hemoptysis - Workup per primary team - placed on doxycycline  3. HTN/volume-  BP well controled. UPO poor 0.2L in 24 hrs. Possibly presented slightly volume overloaded. Continue Norvasc and Coreg  4. Anemia - doesn't appear to have been on ESA as outpatient. Hemoglobin improved at 8.1. Iron stores low- replacing and ESA started 5. Bones- PTH over 500-  cacitriol and phoslo    Georges Lynch    Labs: Basic Metabolic Panel:  Recent Labs Lab 10/04/15 0215 10/05/15 0422 10/06/15 0750  NA 142 141 140  K 3.8 3.9 4.1  CL 105 103 102  CO2 24 26 24   GLUCOSE 97 98 98  BUN 70* 50* 57*  CREATININE 15.05* 12.18* 13.52*  CALCIUM 7.9* 8.3* 8.7*  PHOS 5.5* 4.6 4.9*   Liver Function Tests:  Recent Labs Lab 10/04/15 0215 10/05/15 0422 10/06/15 0750  ALBUMIN 2.8* 2.9* 2.9*   No results for input(s): LIPASE, AMYLASE in the last 168 hours. No results for input(s): AMMONIA in the last 168 hours. CBC:  Recent Labs Lab 10/01/15 0418  10/02/15 0213  10/03/15 0226 10/04/15 0208 10/05/15 0422 10/06/15 0750  WBC 5.9  < > 5.1 5.3 6.8 8.4 9.8  NEUTROABS 3.3  --   --   --   --   --  6.0  HGB 8.2*  < > 7.7* 7.5* 7.5* 8.1* 8.0*  HCT 25.9*  < > 25.0* 24.1* 24.2* 26.1* 25.7*  MCV 82.0  < > 82.2 82.0 82.3 83.4 84.0  PLT 206  < > 178 182 191 197 210  < > = values in this interval not displayed. Cardiac Enzymes: No results for input(s): CKTOTAL, CKMB, CKMBINDEX, TROPONINI in the last 168 hours. CBG: No results for input(s): GLUCAP in the last 168 hours.  Iron Studies:  No results for input(s): IRON, TIBC, TRANSFERRIN, FERRITIN in the last 72 hours. Studies/Results: No results found. Medications: Infusions:    Scheduled Medications: . amLODipine  5 mg Oral q morning - 10a  . calcitRIOL  0.5 mcg Oral Daily  . calcium acetate  1,334 mg Oral TID WC  . carvedilol  12.5 mg Oral Daily  . darbepoetin (ARANESP) injection - DIALYSIS  150 mcg Intravenous Q Sat-HD  . doxycycline  100 mg Oral Q12H  . ferric gluconate (FERRLECIT/NULECIT) IV  125 mg Intravenous Daily    have reviewed  scheduled and prn medications.  Physical Exam: General: NAD Heart: RRR Lungs: mostly clear Abdomen: soft, non tender Extremities: minimal edema Dialysis Access: left upper arm AVF- arm swollen   10/06/2015,11:31 AM  LOS: 5 days

## 2015-10-06 NOTE — Progress Notes (Signed)
Pt c/o pain in R AC where periph IV was located, hard area under skin, PRN pain med given as ordered, ice applied, MD notified

## 2015-10-07 ENCOUNTER — Inpatient Hospital Stay (HOSPITAL_COMMUNITY): Payer: Commercial Managed Care - HMO

## 2015-10-07 ENCOUNTER — Ambulatory Visit (HOSPITAL_COMMUNITY): Payer: Commercial Managed Care - HMO

## 2015-10-07 DIAGNOSIS — R52 Pain, unspecified: Secondary | ICD-10-CM

## 2015-10-07 LAB — RENAL FUNCTION PANEL
Albumin: 2.9 g/dL — ABNORMAL LOW (ref 3.5–5.0)
Anion gap: 16 — ABNORMAL HIGH (ref 5–15)
BUN: 40 mg/dL — ABNORMAL HIGH (ref 6–20)
CALCIUM: 8.7 mg/dL — AB (ref 8.9–10.3)
CHLORIDE: 100 mmol/L — AB (ref 101–111)
CO2: 24 mmol/L (ref 22–32)
CREATININE: 10.82 mg/dL — AB (ref 0.61–1.24)
GFR calc Af Amer: 5 mL/min — ABNORMAL LOW (ref >60)
GFR calc non Af Amer: 5 mL/min — ABNORMAL LOW (ref >60)
GLUCOSE: 116 mg/dL — AB (ref 65–99)
Phosphorus: 4.2 mg/dL (ref 2.5–4.6)
Potassium: 4 mmol/L (ref 3.5–5.1)
SODIUM: 140 mmol/L (ref 135–145)

## 2015-10-07 LAB — URIC ACID: URIC ACID, SERUM: 5.7 mg/dL (ref 4.4–7.6)

## 2015-10-07 LAB — CBC
HCT: 27.7 % — ABNORMAL LOW (ref 39.0–52.0)
HEMOGLOBIN: 8.3 g/dL — AB (ref 13.0–17.0)
MCH: 25.2 pg — AB (ref 26.0–34.0)
MCHC: 30 g/dL (ref 30.0–36.0)
MCV: 84.2 fL (ref 78.0–100.0)
Platelets: 228 10*3/uL (ref 150–400)
RBC: 3.29 MIL/uL — AB (ref 4.22–5.81)
RDW: 15.6 % — ABNORMAL HIGH (ref 11.5–15.5)
WBC: 12.3 10*3/uL — ABNORMAL HIGH (ref 4.0–10.5)

## 2015-10-07 MED ORDER — METHYLPREDNISOLONE 4 MG PO TBPK
8.0000 mg | ORAL_TABLET | Freq: Every morning | ORAL | Status: AC
  Administered 2015-10-07: 8 mg via ORAL
  Filled 2015-10-07: qty 21

## 2015-10-07 MED ORDER — METHYLPREDNISOLONE 4 MG PO TBPK: 4.0000 mg | ORAL_TABLET | Freq: Four times a day (QID) | ORAL | Status: DC

## 2015-10-07 MED ORDER — METHYLPREDNISOLONE 4 MG PO TBPK
4.0000 mg | ORAL_TABLET | ORAL | Status: AC
  Administered 2015-10-07: 4 mg via ORAL

## 2015-10-07 MED ORDER — METHYLPREDNISOLONE 4 MG PO TBPK
4.0000 mg | ORAL_TABLET | Freq: Three times a day (TID) | ORAL | Status: AC
  Administered 2015-10-08 (×3): 4 mg via ORAL

## 2015-10-07 MED ORDER — METHYLPREDNISOLONE 4 MG PO TBPK
8.0000 mg | ORAL_TABLET | Freq: Every evening | ORAL | Status: AC
  Administered 2015-10-08: 8 mg via ORAL

## 2015-10-07 MED ORDER — METHYLPREDNISOLONE 4 MG PO TBPK
8.0000 mg | ORAL_TABLET | Freq: Every evening | ORAL | Status: AC
  Administered 2015-10-07: 8 mg via ORAL

## 2015-10-07 NOTE — Progress Notes (Addendum)
Patient ID: [name], male   DOB: [birth year], 55 y.o.   MRN: [id number]    PROGRESS NOTE    [name]  [id number] DOB: Mar 11, 1961 DOA: 10/01/2015  PCP: Myriam Jacobson, MD   Outpatient Specialists: none so far but will be set up with nephrologist and pulmonologist if needed   Brief Narrative:  55 y.o. male the CKD, s/p recent placement of AV fistula, had first HD 4/14, HTN, sleep apnea, presented to [facility] ED for evaluation of hemoptysis associated with several weeks duration of progressively worsening productive cough,  decreased appetite and a 15 pound unintentional weight loss over the last 4-6 weeks.  Assessment & Plan:   Principal Problem:   Fever overnight 4/17 and 4/18, Tmax 100.6 F - no fevers this AM but WBC is up from yesterday: 10 --> 12  - unclear etiology, pt already on doxycycline which will continue for now - requested CT of the upper extremity as flexure area tender and warm to touch, ? Gout but unable to rule out cellulitis, doxy should be adequate in that case  - will continue to monitor vital signs, repeat CBC In AM and if WBC up, would have low threshold for broader ABX  - pt started on Solumedrol for ? gout    Hemoptysis - unclear etiology but seems to be resolved at this point  - pt started on doxycycline, will continue for now, day #7 - no plan for FOB at this time per PCCM   Active Problems:   ESRD - appreciate nephrology team following  - pt tolerating HD well so far     Essential HTN - continue with home regimen Norvasc and Coreg - added hydralazine as needed     Anemia of chronic disease, ? Kidney dz - Hg 8.3, iron stores low, supplement  - CBC in AM    Morbid obesity due to excess calories  - pt meets criteria with BMI > 35 and underlying HTN, kidney disease  - Body mass index is 36.53 kg  DVT prophylaxis: SCD's Code Status: Full  Family Communication: Patient and daughter at bedside  Disposition Plan: likely by 4/21 or 4/22,  when pt with no fevers and WBC trending down, nephrology team also needs to clear pt for discharge   Consultants:   Nephrology  PCCM  Procedures:   None  Antimicrobials:   Doxycycline 4/14 -->   Subjective: Reports feeling sluggish, chills and sweats at night, RUE swelling and tenderness in the flexure area.   Objective: Filed Vitals:   10/06/15 1844 10/06/15 2106 10/07/15 0026 10/07/15 0649  BP: 188/100 196/87 196/81 184/85  Pulse: 84 86 87 89  Temp: 99.1 F (37.3 C) 97.5 F (36.4 C)  97.9 F (36.6 C)  TempSrc: Oral Oral  Oral  Resp: 11 15  16   Height:      Weight: 114.3 kg (251 lb 15.8 oz)   112.674 kg (248 lb 6.4 oz)  SpO2:  98%  95%    Intake/Output Summary (Last 24 hours) at 10/07/15 1654 Last data filed at 10/07/15 1457  Gross per 24 hour  Intake    660 ml  Output   2500 ml  Net  -1840 ml   Filed Weights   10/06/15 1503 10/06/15 1844 10/07/15 0649  Weight: 116.3 kg (256 lb 6.3 oz) 114.3 kg (251 lb 15.8 oz) 112.674 kg (248 lb 6.4 oz)    Examination:  General exam: Appears calm and comfortable  Respiratory system: Clear  to auscultation. Respiratory effort normal. Cardiovascular system: S1 & S2 heard, no rubs, gallops or clicks. No pedal edema. Gastrointestinal system: Abdomen is nondistended, soft and nontender.  Extremities: Symmetric 5 x 5 power. RUE flexure area tender and warm to touch  Data Reviewed: I have personally reviewed following labs and imaging studies  CBC:  Recent Labs Lab 10/01/15 0418  10/04/15 0208 10/05/15 0422 10/06/15 0750 10/06/15 1702 10/07/15 0202  WBC 5.9  < > 6.8 8.4 9.8 10.2 12.3*  NEUTROABS 3.3  --   --   --  6.0  --   --   HGB 8.2*  < > 7.5* 8.1* 8.0* 7.8* 8.3*  HCT 25.9*  < > 24.2* 26.1* 25.7* 25.3* 27.7*  MCV 82.0  < > 82.3 83.4 84.0 83.5 84.2  PLT 206  < > 191 197 210 235 228  < > = values in this interval not displayed. Basic Metabolic Panel:  Recent Labs Lab 10/01/15 0931  10/04/15 0215  10/05/15 0422 10/06/15 0750 10/06/15 1701 10/07/15 0203  NA  --   < > 142 141 140 139 140  K  --   < > 3.8 3.9 4.1 3.9 4.0  CL  --   < > 105 103 102 101 100*  CO2  --   < > 24 26 24 23 24   GLUCOSE  --   < > 97 98 98 142* 116*  BUN  --   < > 70* 50* 57* 60* 40*  CREATININE 16.47*  < > 15.05* 12.18* 13.52* 13.71* 10.82*  CALCIUM  --   < > 7.9* 8.3* 8.7* 8.6* 8.7*  MG 2.2  --   --   --   --   --   --   PHOS 7.0*  < > 5.5* 4.6 4.9* 4.4 4.2  < > = values in this interval not displayed. Liver Function Tests:  Recent Labs Lab 10/04/15 0215 10/05/15 0422 10/06/15 0750 10/06/15 1701 10/07/15 0203  ALBUMIN 2.8* 2.9* 2.9* 2.9* 2.9*   Coagulation Profile:  Recent Labs Lab 10/01/15 0606  INR 1.14   Urine analysis:    Component Value Date/Time   COLORURINE YELLOW 10/01/2015 1256   APPEARANCEUR CLEAR 10/01/2015 1256   LABSPEC 1.010 10/01/2015 1256   PHURINE 6.5 10/01/2015 1256   GLUCOSEU NEGATIVE 10/01/2015 1256   HGBUR MODERATE* 10/01/2015 1256   BILIRUBINUR NEGATIVE 10/01/2015 1256   KETONESUR NEGATIVE 10/01/2015 1256   PROTEINUR 100* 10/01/2015 1256   UROBILINOGEN 0.2 09/22/2014 1346   NITRITE NEGATIVE 10/01/2015 1256   LEUKOCYTESUR NEGATIVE 10/01/2015 1256   Radiology Studies: Dg Chest 2 View 10/01/2015  Patchy opacities in both lung bases, possibly infectious infiltrate versus hemorrhage. Unchanged cardiomegaly.   Ct Chest Wo Contrast 10/01/2015  Bibasilar airspace disease, at least partially alveolar hemorrhage given the history. Superimposed pneumonia cannot be excluded. No definitive cause of hemoptysis is seen. Given renal failure question pulmonary renal syndrome.   US Renal 10/01/2015  Echogenic kidneys are again noted consistent chronic medical renal disease. 2. No evidence of hydronephrosis or bladder distention. No acute abnormality identified.  Scheduled Meds: . amLODipine  5 mg Oral q morning - 10a  . calcitRIOL  0.5 mcg Oral Daily  . calcium acetate   1,334 mg Oral TID WC  . carvedilol  12.5 mg Oral Daily  . darbepoetin (ARANESP) injection - DIALYSIS  150 mcg Intravenous Q Sat-HD  . doxycycline  100 mg Oral Q12H  . ferric gluconate (FERRLECIT/NULECIT) IV  125 mg  Intravenous Daily  . methylPREDNISolone  4 mg Oral PC supper  . [START ON 10/08/2015] methylPREDNISolone  4 mg Oral 3 x daily with food  . [START ON 10/09/2015] methylPREDNISolone  4 mg Oral 4X daily taper  . methylPREDNISolone  8 mg Oral Nightly  . [START ON 10/08/2015] methylPREDNISolone  8 mg Oral Nightly   Continuous Infusions:    LOS: 6 days   Time spent: 20 minutes   Faye Ramsay, MD [facility] Pager [phone]  If 7PM-7AM, please contact night-coverage [email or web address] Password [facility] 10/07/2015, 4:54 PM

## 2015-10-07 NOTE — Progress Notes (Signed)
Subjective:  HD yesterday. Cough improved. C/o Rt elbow pain, concerning w/   Objective Vital signs in last 24 hours: Filed Vitals:   10/06/15 1844 10/06/15 2106 10/07/15 0026 10/07/15 0649  BP: 188/100 196/87 196/81 184/85  Pulse: 84 86 87 89  Temp: 99.1 F (37.3 C) 97.5 F (36.4 C)  97.9 F (36.6 C)  TempSrc: Oral Oral  Oral  Resp: 11 15  16   Height:      Weight: 251 lb 15.8 oz (114.3 kg)   248 lb 6.4 oz (112.674 kg)  SpO2:  98%  95%   Weight change: 1 lb 3.1 oz (0.542 kg)  Intake/Output Summary (Last 24 hours) at 10/07/15 1218 Last data filed at 10/07/15 0948  Gross per 24 hour  Intake    360 ml  Output   2500 ml  Net  -2140 ml     Assessment/Plan: 55 year old black male with progressive renal insufficiency now with uremic symptoms  1.Renal- advanced and progressive chronic kidney disease. He is now at ESRD. Will look for outpatient dialysisarrangements; Tolerating sessions well. HD yesterday, plan for HD tomorrow. 2.Hemoptysis - Workup per primary team - placed on doxycycline  3. HTN/volume-  BP well controled. UPO poor 0.2L in 24 hrs. Possibly presented slightly volume overloaded. Continue Norvasc and Coreg  4. Anemia - doesn't appear to have been on ESA as outpatient. Hemoglobin improved at 8.1. Iron stores low- replacing and ESA started 5. Bones- PTH over 500-  cacitriol and phoslo  6. Elbow pain - concern for gout. Cannot r/o septic arthritis at this time but VERY unlikely w/ history. Starting Medrol dose pack now as NSAIDs are contraindicated, monitor closely. Uric acid 5.7 (but not always high in acute cases)   Georges Lynch    Labs: Basic Metabolic Panel:  Recent Labs Lab 10/06/15 0750 10/06/15 1701 10/07/15 0203  NA 140 139 140  K 4.1 3.9 4.0  CL 102 101 100*  CO2 24 23 24   GLUCOSE 98 142* 116*  BUN 57* 60* 40*  CREATININE 13.52* 13.71* 10.82*  CALCIUM 8.7* 8.6* 8.7*  PHOS 4.9* 4.4 4.2   Liver Function Tests:  Recent Labs Lab 10/06/15 0750  10/06/15 1701 10/07/15 0203  ALBUMIN 2.9* 2.9* 2.9*   No results for input(s): LIPASE, AMYLASE in the last 168 hours. No results for input(s): AMMONIA in the last 168 hours. CBC:  Recent Labs Lab 10/01/15 0418  10/04/15 0208 10/05/15 0422 10/06/15 0750 10/06/15 1702 10/07/15 0202  WBC 5.9  < > 6.8 8.4 9.8 10.2 12.3*  NEUTROABS 3.3  --   --   --  6.0  --   --   HGB 8.2*  < > 7.5* 8.1* 8.0* 7.8* 8.3*  HCT 25.9*  < > 24.2* 26.1* 25.7* 25.3* 27.7*  MCV 82.0  < > 82.3 83.4 84.0 83.5 84.2  PLT 206  < > 191 197 210 235 228  < > = values in this interval not displayed. Cardiac Enzymes: No results for input(s): CKTOTAL, CKMB, CKMBINDEX, TROPONINI in the last 168 hours. CBG:  Recent Labs Lab 10/06/15 2317  GLUCAP 113*    Iron Studies:  No results for input(s): IRON, TIBC, TRANSFERRIN, FERRITIN in the last 72 hours. Studies/Results: Dg Chest 2 View  10/06/2015  CLINICAL DATA:  Fever and weakness today. Chest discomfort for 2 days. EXAM: CHEST  2 VIEW COMPARISON:  Chest CT 10/01/2015. FINDINGS: The heart is enlarged but stable. Stable tortuosity and mild ectasia of the thoracic aorta. Persistent  bilateral lower lobe infiltrates. No pleural effusion. The bony thorax is intact. IMPRESSION: Persistent bilateral lower lobe infiltrates. Electronically Signed   By: Marijo Sanes M.D.   On: 10/06/2015 12:34   Medications: Infusions:    Scheduled Medications: . amLODipine  5 mg Oral q morning - 10a  . calcitRIOL  0.5 mcg Oral Daily  . calcium acetate  1,334 mg Oral TID WC  . carvedilol  12.5 mg Oral Daily  . darbepoetin (ARANESP) injection - DIALYSIS  150 mcg Intravenous Q Sat-HD  . doxycycline  100 mg Oral Q12H  . ferric gluconate (FERRLECIT/NULECIT) IV  125 mg Intravenous Daily  . methylPREDNISolone  4 mg Oral PC lunch  . methylPREDNISolone  4 mg Oral PC supper  . [START ON 10/08/2015] methylPREDNISolone  4 mg Oral 3 x daily with food  . [START ON 10/09/2015] methylPREDNISolone  4  mg Oral 4X daily taper  . methylPREDNISolone  8 mg Oral Nightly  . [START ON 10/08/2015] methylPREDNISolone  8 mg Oral Nightly    have reviewed scheduled and prn medications.  Physical Exam: General: NAD Heart: RRR Lungs: mostly clear Abdomen: soft, non tender Extremities: minimal edema. Rt elbow tenderness and warmth. ROM limited by pain Dialysis Access: left upper arm AVF- arm swollen   10/07/2015,12:18 PM  LOS: 6 days

## 2015-10-07 NOTE — Progress Notes (Deleted)
    Postoperative Access Visit   History of Present Illness  [name] is a 55 y.o. year old male who presents for postoperative follow-up for: L BC AVF (Date: 08/17/15).  The patient's wounds are *** healed.  The patient notes *** steal symptoms.  The patient is *** able to complete their activities of daily living.  The patient's current symptoms are: ***.  For VQI Use Only  PRE-ADM LIVING: {VQI Pre-admission Living:20973}  AMB STATUS: {VQI Ambulatory Status:20974}  Physical Examination There were no vitals filed for this visit.  LUE: Incision is *** healed, skin feels ***, hand grip is ***/5, sensation in digits is *** intact, ***palpable thrill, bruit can *** be auscultated   Medical Decision Making  [name] is a 55 y.o. year old male who presents s/p L BC AVF.   The patient's access is *** ready for use.  Thank you for allowing [place] to participate in this patient's care.  Adele Barthel, MD [facility] Office: [phone] Pager: [phone]  10/07/2015, 9:28 PM

## 2015-10-07 NOTE — Progress Notes (Addendum)
VASCULAR LAB PRELIMINARY  PRELIMINARY  PRELIMINARY  PRELIMINARY  Right upper extremity venous duplex completed.    Preliminary report:  There is no DVT noted in the right upper extremity.  There is acute superficial thrombosis noted in the right basilic and cephalic veins from forearm to above the [facility].  Called report to Graham, RN 12:00.  Yulanda Diggs, RVT 10/07/2015, 11:59 AM

## 2015-10-08 ENCOUNTER — Encounter: Payer: Commercial Managed Care - HMO | Admitting: Vascular Surgery

## 2015-10-08 ENCOUNTER — Encounter: Payer: Self-pay | Admitting: Vascular Surgery

## 2015-10-08 DIAGNOSIS — D538 Other specified nutritional anemias: Secondary | ICD-10-CM

## 2015-10-08 DIAGNOSIS — N185 Chronic kidney disease, stage 5: Secondary | ICD-10-CM

## 2015-10-08 LAB — RENAL FUNCTION PANEL
Albumin: 2.9 g/dL — ABNORMAL LOW (ref 3.5–5.0)
Anion gap: 17 — ABNORMAL HIGH (ref 5–15)
BUN: 64 mg/dL — ABNORMAL HIGH (ref 6–20)
CHLORIDE: 97 mmol/L — AB (ref 101–111)
CO2: 23 mmol/L (ref 22–32)
Calcium: 8.9 mg/dL (ref 8.9–10.3)
Creatinine, Ser: 13.51 mg/dL — ABNORMAL HIGH (ref 0.61–1.24)
GFR, EST AFRICAN AMERICAN: 4 mL/min — AB (ref >60)
GFR, EST NON AFRICAN AMERICAN: 4 mL/min — AB (ref >60)
Glucose, Bld: 136 mg/dL — ABNORMAL HIGH (ref 65–99)
POTASSIUM: 4.6 mmol/L (ref 3.5–5.1)
Phosphorus: 6.7 mg/dL — ABNORMAL HIGH (ref 2.5–4.6)
Sodium: 137 mmol/L (ref 135–145)

## 2015-10-08 LAB — PROCALCITONIN: Procalcitonin: 0.88 ng/mL

## 2015-10-08 LAB — CBC
HEMATOCRIT: 27.9 % — AB (ref 39.0–52.0)
HEMOGLOBIN: 8.6 g/dL — AB (ref 13.0–17.0)
MCH: 25.7 pg — ABNORMAL LOW (ref 26.0–34.0)
MCHC: 30.8 g/dL (ref 30.0–36.0)
MCV: 83.5 fL (ref 78.0–100.0)
Platelets: 247 10*3/uL (ref 150–400)
RBC: 3.34 MIL/uL — AB (ref 4.22–5.81)
RDW: 15.6 % — ABNORMAL HIGH (ref 11.5–15.5)
WBC: 11.8 10*3/uL — AB (ref 4.0–10.5)

## 2015-10-08 MED ORDER — HEPARIN SODIUM (PORCINE) 1000 UNIT/ML DIALYSIS
1000.0000 [IU] | INTRAMUSCULAR | Status: DC | PRN
Start: 2015-10-08 — End: 2015-10-08

## 2015-10-08 MED ORDER — AMOXICILLIN-POT CLAVULANATE 500-125 MG PO TABS: 500.0000 mg | ORAL_TABLET | Freq: Three times a day (TID) | ORAL | Status: DC

## 2015-10-08 MED ORDER — LIDOCAINE-PRILOCAINE 2.5-2.5 % EX CREA: 1.0000 "application " | TOPICAL_CREAM | CUTANEOUS | Status: DC | PRN

## 2015-10-08 MED ORDER — SODIUM CHLORIDE 0.9 % IV SOLN: 100.0000 mL | INTRAVENOUS | Status: DC | PRN

## 2015-10-08 MED ORDER — ALTEPLASE 2 MG IJ SOLR: 2.0000 mg | Freq: Once | INTRAMUSCULAR | Status: DC | PRN

## 2015-10-08 MED ORDER — LIDOCAINE HCL (PF) 1 % IJ SOLN: 5.0000 mL | INTRAMUSCULAR | Status: DC | PRN

## 2015-10-08 MED ORDER — PENTAFLUOROPROP-TETRAFLUOROETH EX AERO: 1.0000 "application " | INHALATION_SPRAY | CUTANEOUS | Status: DC | PRN

## 2015-10-08 MED ORDER — CLINDAMYCIN HCL 300 MG PO CAPS
300.0000 mg | ORAL_CAPSULE | Freq: Four times a day (QID) | ORAL | Status: DC
  Administered 2015-10-08 – 2015-10-09 (×5): 300 mg via ORAL
  Filled 2015-10-08 (×5): qty 1

## 2015-10-08 NOTE — Progress Notes (Signed)
Subjective:  HD scheduled for today. Rt elbow pain improved. No other concerns   Objective Vital signs in last 24 hours: Filed Vitals:   10/07/15 0026 10/07/15 0649 10/07/15 2139 10/08/15 0651  BP: 196/81 184/85 136/74 140/70  Pulse: 87 89 83 75  Temp:  97.9 F (36.6 C) 98.2 F (36.8 C) 98.5 F (36.9 C)  TempSrc:  Oral Oral Oral  Resp:  16 16 16   Height:      Weight:  248 lb 6.4 oz (112.674 kg)  249 lb 6.4 oz (113.127 kg)  SpO2:  95% 98% 95%   Weight change: -6 lb 15.9 oz (-3.173 kg)  Intake/Output Summary (Last 24 hours) at 10/08/15 1206 Last data filed at 10/08/15 1049  Gross per 24 hour  Intake    580 ml  Output      0 ml  Net    580 ml     Assessment/Plan: 55 year old black male with progressive renal insufficiency now with uremic symptoms  1.Renal- advanced and progressive chronic kidney disease. He is now at ESRD. Will look for outpatient dialysisarrangements; Tolerating sessions well. HD planned for today. Of Note: Patient is surrounded by food and drink brought from outside the hospital. None of which appears very suitable for a renal patient. Would consider Nutrition Consult to discuss proper diet choices, but will leave to primary team to decide.  2.Hemoptysis - Workup per primary team  3. HTN/volume-  BP well controled. UPO poor 0.3L in 24 hrs. Possibly presented slightly volume overloaded. Continue Norvasc and Coreg  4. Anemia - doesn't appear to have been on ESA as outpatient. Hemoglobin improved at 8.6. Iron stores low- replacing and ESA started 5. Bones- PTH over 500-  cacitriol and phoslo  6. Elbow pain - concern for gout. Cannot r/o septic arthritis at this time but VERY unlikely w/ history. Starting Medrol dose pack now as NSAIDs are contraindicated, monitor closely. Uric acid 5.7 (but not always high in acute cases). Seems improved today.   Georges Lynch    Labs: Basic Metabolic Panel:  Recent Labs Lab 10/06/15 1701 10/07/15 0203 10/08/15 0648  NA  139 140 137  K 3.9 4.0 4.6  CL 101 100* 97*  CO2 23 24 23   GLUCOSE 142* 116* 136*  BUN 60* 40* 64*  CREATININE 13.71* 10.82* 13.51*  CALCIUM 8.6* 8.7* 8.9  PHOS 4.4 4.2 6.7*   Liver Function Tests:  Recent Labs Lab 10/06/15 1701 10/07/15 0203 10/08/15 0648  ALBUMIN 2.9* 2.9* 2.9*   No results for input(s): LIPASE, AMYLASE in the last 168 hours. No results for input(s): AMMONIA in the last 168 hours. CBC:  Recent Labs Lab 10/05/15 0422 10/06/15 0750 10/06/15 1702 10/07/15 0202 10/08/15 0647  WBC 8.4 9.8 10.2 12.3* 11.8*  NEUTROABS  --  6.0  --   --   --   HGB 8.1* 8.0* 7.8* 8.3* 8.6*  HCT 26.1* 25.7* 25.3* 27.7* 27.9*  MCV 83.4 84.0 83.5 84.2 83.5  PLT 197 210 235 228 247   Cardiac Enzymes: No results for input(s): CKTOTAL, CKMB, CKMBINDEX, TROPONINI in the last 168 hours. CBG:  Recent Labs Lab 10/06/15 2317  GLUCAP 113*    Iron Studies:  No results for input(s): IRON, TIBC, TRANSFERRIN, FERRITIN in the last 72 hours. Studies/Results: Dg Chest 2 View  10/06/2015  CLINICAL DATA:  Fever and weakness today. Chest discomfort for 2 days. EXAM: CHEST  2 VIEW COMPARISON:  Chest CT 10/01/2015. FINDINGS: The heart is enlarged but stable.  Stable tortuosity and mild ectasia of the thoracic aorta. Persistent bilateral lower lobe infiltrates. No pleural effusion. The bony thorax is intact. IMPRESSION: Persistent bilateral lower lobe infiltrates. Electronically Signed   By: Marijo Sanes M.D.   On: 10/06/2015 12:34   Ct Elbow Right Wo Contrast  10/07/2015  CLINICAL DATA:  Right elbow pain and swelling. Pain with blood pressure cuff inflation. Prior antecubital IV in this arm. EXAM: CT OF THE RIGHT ELBOW WITHOUT CONTRAST TECHNIQUE: Multidetector CT imaging was performed according to the standard protocol. Multiplanar CT image reconstructions were also generated. COMPARISON:  None. 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 Degenerative subcortical cystic lesions are present along the radial head and capitellum, as shown on images 49 through 62 series 10. These lesions are somewhat confluent. I do not observe a fracture.  There is an elbow joint effusion. Subcutaneous stranding is present along the elbow especially volar and medial. There is also subcutaneous edema tracking along the superficial fascia margin in the proximal forearm, particularly along the humeral head of the pronator teres and flexor carpi radialis, and to a lesser degree along the brachioradialis. I do not see a discrete abscess. There is some expansion of the cephalic vein in the antecubital region, and thrombosis of the cephalic vein in this vicinity is not excluded. No obvious cubital tunnel impingement. Biceps and triceps tendons appear intact distally. IMPRESSION: 1. Expansion and some increased density within the distal cephalic vein in the antecubital region, possibly thrombosed. 2. There is some surrounding edema in the subcutaneous tissues of the antecubital region and tracking along the superficial fascia margins of the proximal forearm musculature. I do not see a discrete abscess. 3. 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 4. Confluent degenerative subcortical cystic lesions in the radio-capitellar articulation. Electronically Signed   By: Van Clines M.D.   On: 10/07/2015 21:28   Medications: Infusions:    Scheduled Medications: . amLODipine  5 mg Oral q morning - 10a  . calcitRIOL  0.5 mcg Oral Daily  . calcium acetate  1,334 mg Oral TID WC  . carvedilol  12.5 mg Oral Daily  . clindamycin  300 mg Oral Q6H  . darbepoetin (ARANESP) injection - DIALYSIS  150 mcg Intravenous Q Sat-HD  . methylPREDNISolone  4 mg Oral 3 x daily with food  . [START ON 10/09/2015] methylPREDNISolone  4 mg Oral 4X daily taper  . methylPREDNISolone  8 mg  Oral Nightly    have reviewed scheduled and prn medications.  Physical Exam: General: NAD Heart: RRR Lungs: mostly clear Abdomen: soft, non tender Extremities: minimal edema. Rt elbow tenderness and warmth. ROM limited by pain Dialysis Access: left upper arm AVF- arm swollen   10/08/2015,12:06 PM  LOS: 7 days

## 2015-10-08 NOTE — Care Management Note (Signed)
Case Management Note  Patient Details  Name: [name] MRN: [id number] Date of Birth: 1960-07-14  Subjective/Objective:  Hemoptysis, CKD (new dialysis), anemia, receiving dialysis this admission                  Action/Plan: Discharge Planning: 10/08/2015 CM verified with HD that pt has successfully been clipped at the center and schedule listed below  Chart reviewed. Lives at home with wife, [name] # [phone]. Pt's outpatient dialysis Center, [facility] on MWF. Plans to apply for his disability. Will continue to follow for dc needs.   PCP- Lorene Dy MD  Expected Discharge Date:                  Expected Discharge Plan:  Home/Self Care  In-House Referral:  NA  Discharge planning Services  CM Consult  Post Acute Care Choice:  NA Choice offered to:  NA  DME Arranged:  N/A DME Agency:  NA  HH Arranged:  NA HH Agency:  NA  Status of Service:  In process, will continue to follow  Medicare Important Message Given:    Date Medicare IM Given:    Medicare IM give by:    Date Additional Medicare IM Given:    Additional Medicare Important Message give by:     If discussed at [place] of Stay Meetings, dates discussed:    Additional Comments:  Maryclare Labrador, RN 10/08/2015, 10:41 AM

## 2015-10-08 NOTE — Progress Notes (Addendum)
Patient ID: [name], male   DOB: 04/12/1961, 55 y.o.   MRN: [id number]    PROGRESS NOTE    MIKIAS NEUBER  [id number] DOB: July 05, 1960 DOA: 10/01/2015  PCP: Myriam Jacobson, MD   Outpatient Specialists: none so far but will be set up with nephrologist and pulmonologist if needed   Brief Narrative:  55 y.o. male the CKD, s/p recent placement of AV fistula, had first HD 4/14, HTN, sleep apnea, presented to [facility] ED for evaluation of hemoptysis associated with several weeks duration of progressively worsening productive cough,  decreased appetite and a 15 pound unintentional weight loss over the last 4-6 weeks.  Assessment & Plan:   Principal Problem:   Fever   4/17 and 4/18, resolved small volume hemoptysis on 4/14. CT chest shows bilateral basilar scattered mixed interstitial and alveolar opacities without any evidence for mass, endobronchial lesion. His UA has blood and protein, pulmonary suspect that the patient has Wegener's or Goodpasture's, however ANCA negative - no fevers , white blood cell improving - unclear etiology, pt already on doxycycline , Switched to clindamycin 3 days for possible aspiration, DC doxycycline - To further investigate the patient's  febrile episodes, CT of the upper extremity was performed, as flexure area tender and warm to touch, ? Found to have distal cephalic vein thrombosis confirmed on venous Doppler, no discrete abscess Superficial thrombosis likely causing patient's fever and pain - pt started on Solumedrol for ? Gout , currently on prednisone taper  Superficial thrombosis acute superficial thrombosis noted in the right basilic  Warm compresses    Hemoptysis - unclear etiology but seems to be resolved at this point , no indication for inpatient bronchoscopy as per pulmonary - pt started on doxycycline, will continue for now, day #7 He will need follow-up imaging in 2-3 weeks to ensure resolution of infiltrates on chest x-ray If he has  further episodes of hemoptysis we'll pursue bronchoscopy, follow-up with pulmonary Switched to clindamycin 3 days for possible aspiration, DC doxycycline  Cannot use fluoroquinolones due to QTC prolongation Cannot use Augmentin secondary to allergy to penicillin     ESRD - appreciate nephrology team following  He is now at ESRD. Will look for outpatient dialysisarrangements    Essential HTN - continue with home regimen Norvasc and Coreg - added hydralazine as needed     Anemia of chronic disease, ? Kidney dz - Hg 8.3, iron stores low, supplement  - CBC in AM    Morbid obesity due to excess calories  - pt meets criteria with BMI > 35 and underlying HTN, kidney disease  - Body mass index is 36.53 kg  DVT prophylaxis: SCD's Code Status: Full  Family Communication: Patient and daughter at bedside  Disposition Plan:  C pending LIP   Consultants:   Nephrology  PCCM  Procedures:   None  Antimicrobials:   Doxycycline 4/14 -->  4/21  Augmentin 4/217 days   Subjective:    Reports feeling well, denies any pain in his left upper extremity  Objective: Filed Vitals:   10/07/15 0026 10/07/15 0649 10/07/15 2139 10/08/15 0651  BP: 196/81 184/85 136/74 140/70  Pulse: 87 89 83 75  Temp:  97.9 F (36.6 C) 98.2 F (36.8 C) 98.5 F (36.9 C)  TempSrc:  Oral Oral Oral  Resp:  16 16 16   Height:      Weight:  112.674 kg (248 lb 6.4 oz)  113.127 kg (249 lb 6.4 oz)  SpO2:  95% 98%  95%    Intake/Output Summary (Last 24 hours) at 10/08/15 0846 Last data filed at 10/07/15 1457  Gross per 24 hour  Intake    660 ml  Output      0 ml  Net    660 ml   Filed Weights   10/06/15 1844 10/07/15 0649 10/08/15 0651  Weight: 114.3 kg (251 lb 15.8 oz) 112.674 kg (248 lb 6.4 oz) 113.127 kg (249 lb 6.4 oz)    Examination:  General exam: Appears calm and comfortable  Respiratory system: Clear to auscultation. Respiratory effort normal. Cardiovascular system: S1 & S2 heard, no rubs,  gallops or clicks. No pedal edema. Gastrointestinal system: Abdomen is nondistended, soft and nontender.  Extremities: Symmetric 5 x 5 power. RUE flexure area tender and warm to touch  Data Reviewed: I have personally reviewed following labs and imaging studies  CBC:  Recent Labs Lab 10/05/15 0422 10/06/15 0750 10/06/15 1702 10/07/15 0202 10/08/15 0647  WBC 8.4 9.8 10.2 12.3* 11.8*  NEUTROABS  --  6.0  --   --   --   HGB 8.1* 8.0* 7.8* 8.3* 8.6*  HCT 26.1* 25.7* 25.3* 27.7* 27.9*  MCV 83.4 84.0 83.5 84.2 83.5  PLT 197 210 235 228 [id number]   Basic Metabolic Panel:  Recent Labs Lab 10/01/15 0931  10/05/15 0422 10/06/15 0750 10/06/15 1701 10/07/15 0203 10/08/15 0648  NA  --   < > 141 140 139 140 137  K  --   < > 3.9 4.1 3.9 4.0 4.6  CL  --   < > 103 102 101 100* 97*  CO2  --   < > 26 24 23 24 23   GLUCOSE  --   < > 98 98 142* 116* 136*  BUN  --   < > 50* 57* 60* 40* 64*  CREATININE 16.47*  < > 12.18* 13.52* 13.71* 10.82* 13.51*  CALCIUM  --   < > 8.3* 8.7* 8.6* 8.7* 8.9  MG 2.2  --   --   --   --   --   --   PHOS 7.0*  < > 4.6 4.9* 4.4 4.2 6.7*  < > = values in this interval not displayed. Liver Function Tests:  Recent Labs Lab 10/05/15 0422 10/06/15 0750 10/06/15 1701 10/07/15 0203 10/08/15 0648  ALBUMIN 2.9* 2.9* 2.9* 2.9* 2.9*   Coagulation Profile: No results for input(s): INR, PROTIME in the last 168 hours. Urine analysis:    Component Value Date/Time   COLORURINE YELLOW 10/01/2015 [place] 10/01/2015 1256   LABSPEC 1.010 10/01/2015 1256   PHURINE 6.5 10/01/2015 1256   GLUCOSEU NEGATIVE 10/01/2015 1256   HGBUR MODERATE* 10/01/2015 1256   BILIRUBINUR NEGATIVE 10/01/2015 1256   KETONESUR NEGATIVE 10/01/2015 1256   PROTEINUR 100* 10/01/2015 1256   UROBILINOGEN 0.2 09/22/2014 1346   NITRITE NEGATIVE 10/01/2015 1256   LEUKOCYTESUR NEGATIVE 10/01/2015 1256   Radiology Studies: Dg Chest 2 View 10/01/2015  Patchy opacities in both lung  bases, possibly infectious infiltrate versus hemorrhage. Unchanged cardiomegaly.   Ct Chest Wo Contrast 10/01/2015  Bibasilar airspace disease, at least partially alveolar hemorrhage given the history. Superimposed pneumonia cannot be excluded. No definitive cause of hemoptysis is seen. Given renal failure question pulmonary renal syndrome.   US Renal 10/01/2015  Echogenic kidneys are again noted consistent chronic medical renal disease. 2. No evidence of hydronephrosis or bladder distention. No acute abnormality identified.  Scheduled Meds: . amLODipine  5 mg Oral q morning -  10a  . calcitRIOL  0.5 mcg Oral Daily  . calcium acetate  1,334 mg Oral TID WC  . carvedilol  12.5 mg Oral Daily  . darbepoetin (ARANESP) injection - DIALYSIS  150 mcg Intravenous Q Sat-HD  . doxycycline  100 mg Oral Q12H  . ferric gluconate (FERRLECIT/NULECIT) IV  125 mg Intravenous Daily  . methylPREDNISolone  4 mg Oral 3 x daily with food  . [START ON 10/09/2015] methylPREDNISolone  4 mg Oral 4X daily taper  . methylPREDNISolone  8 mg Oral Nightly   Continuous Infusions:    LOS: 7 days   Time spent: 20 minutes   Reyne Dumas, MD [facility] Pager [phone]  If 7PM-7AM, please contact night-coverage [email or web address] Password [facility] 10/08/2015, 8:46 AM

## 2015-10-08 NOTE — Progress Notes (Signed)
This encounter was created in error - please disregard.

## 2015-10-09 DIAGNOSIS — N189 Chronic kidney disease, unspecified: Secondary | ICD-10-CM

## 2015-10-09 DIAGNOSIS — I10 Essential (primary) hypertension: Secondary | ICD-10-CM

## 2015-10-09 LAB — COMPREHENSIVE METABOLIC PANEL
ALBUMIN: 2.9 g/dL — AB (ref 3.5–5.0)
ALT: 11 U/L — ABNORMAL LOW (ref 17–63)
ANION GAP: 17 — AB (ref 5–15)
AST: 15 U/L (ref 15–41)
Alkaline Phosphatase: 66 U/L (ref 38–126)
BUN: 48 mg/dL — ABNORMAL HIGH (ref 6–20)
CHLORIDE: 95 mmol/L — AB (ref 101–111)
CO2: 24 mmol/L (ref 22–32)
Calcium: 9.1 mg/dL (ref 8.9–10.3)
Creatinine, Ser: 10.44 mg/dL — ABNORMAL HIGH (ref 0.61–1.24)
GFR calc non Af Amer: 5 mL/min — ABNORMAL LOW (ref >60)
GFR, EST AFRICAN AMERICAN: 6 mL/min — AB (ref >60)
GLUCOSE: 143 mg/dL — AB (ref 65–99)
POTASSIUM: 4.1 mmol/L (ref 3.5–5.1)
SODIUM: 136 mmol/L (ref 135–145)
Total Bilirubin: 0.3 mg/dL (ref 0.3–1.2)
Total Protein: 7 g/dL (ref 6.5–8.1)

## 2015-10-09 LAB — CBC
HCT: 29.8 % — ABNORMAL LOW (ref 39.0–52.0)
HEMOGLOBIN: 9 g/dL — AB (ref 13.0–17.0)
MCH: 25.4 pg — AB (ref 26.0–34.0)
MCHC: 30.2 g/dL (ref 30.0–36.0)
MCV: 84.2 fL (ref 78.0–100.0)
PLATELETS: 267 10*3/uL (ref 150–400)
RBC: 3.54 MIL/uL — ABNORMAL LOW (ref 4.22–5.81)
RDW: 15.7 % — ABNORMAL HIGH (ref 11.5–15.5)
WBC: 16 10*3/uL — ABNORMAL HIGH (ref 4.0–10.5)

## 2015-10-09 MED ORDER — PROCHLORPERAZINE MALEATE 10 MG PO TABS: 10.0000 mg | ORAL_TABLET | Freq: Four times a day (QID) | ORAL | Status: DC | PRN

## 2015-10-09 MED ORDER — CALCIUM ACETATE (PHOS BINDER) 667 MG PO CAPS: 1334.0000 mg | ORAL_CAPSULE | Freq: Three times a day (TID) | ORAL | Status: DC

## 2015-10-09 MED ORDER — DIAZEPAM 5 MG PO TABS: 5.0000 mg | ORAL_TABLET | Freq: Every day | ORAL | Status: DC | PRN

## 2015-10-09 MED ORDER — CALCITRIOL 0.5 MCG PO CAPS: 0.5000 ug | ORAL_CAPSULE | Freq: Every day | ORAL | Status: DC

## 2015-10-09 NOTE — Progress Notes (Signed)
[facility] ROUNDING NOTE   Subjective:   Interval History: to be discharged today  Objective:  Vital signs in last 24 hours:  Temp:  [97.5 F (36.4 C)-98.4 F (36.9 C)] 98.4 F (36.9 C) (04/22 0930) Pulse Rate:  [70-83] 77 (04/22 0930) Resp:  [16-20] 18 (04/22 0645) BP: (100-143)/(65-83) 114/76 mmHg (04/22 0934) SpO2:  [96 %-98 %] 98 % (04/22 0930) Weight:  [110.9 kg (244 lb 7.8 oz)-114.2 kg (251 lb 12.3 oz)] 110.9 kg (244 lb 7.8 oz) (04/21 1807)  Weight change: 1.073 kg (2 lb 5.9 oz) Filed Weights   10/08/15 0651 10/08/15 1420 10/08/15 1807  Weight: 113.127 kg (249 lb 6.4 oz) 114.2 kg (251 lb 12.3 oz) 110.9 kg (244 lb 7.8 oz)    Intake/Output: I/O last 3 completed shifts: In: 520 [P.O.:420; IV Piggyback:100] Out: 2968 [Other:2968]   Intake/Output this shift:  Total I/O In: 240 [P.O.:240] Out: -   CVS- RRR RS- CTA ABD- BS present soft non-distended EXT- no edema   Basic Metabolic Panel:  Recent Labs Lab 10/05/15 0422 10/06/15 0750 10/06/15 1701 10/07/15 0203 10/08/15 0648 10/09/15 0252  NA 141 140 139 140 137 136  K 3.9 4.1 3.9 4.0 4.6 4.1  CL 103 102 101 100* 97* 95*  CO2 26 24 23 24 23 24   GLUCOSE 98 98 142* 116* 136* 143*  BUN 50* 57* 60* 40* 64* 48*  CREATININE 12.18* 13.52* 13.71* 10.82* 13.51* 10.44*  CALCIUM 8.3* 8.7* 8.6* 8.7* 8.9 9.1  PHOS 4.6 4.9* 4.4 4.2 6.7*  --     Liver Function Tests:  Recent Labs Lab 10/06/15 0750 10/06/15 1701 10/07/15 0203 10/08/15 0648 10/09/15 0252  AST  --   --   --   --  15  ALT  --   --   --   --  11*  ALKPHOS  --   --   --   --  66  BILITOT  --   --   --   --  0.3  PROT  --   --   --   --  7.0  ALBUMIN 2.9* 2.9* 2.9* 2.9* 2.9*   No results for input(s): LIPASE, AMYLASE in the last 168 hours. No results for input(s): AMMONIA in the last 168 hours.  CBC:  Recent Labs Lab 10/06/15 0750 10/06/15 1702 10/07/15 0202 10/08/15 0647 10/09/15 0252  WBC 9.8 10.2 12.3* 11.8* 16.0*   NEUTROABS 6.0  --   --   --   --   HGB 8.0* 7.8* 8.3* 8.6* 9.0*  HCT 25.7* 25.3* 27.7* 27.9* 29.8*  MCV 84.0 83.5 84.2 83.5 84.2  PLT 210 235 228 247 267    Cardiac Enzymes: No results for input(s): CKTOTAL, CKMB, CKMBINDEX, TROPONINI in the last 168 hours.  BNP: Invalid input(s): POCBNP  CBG:  Recent Labs Lab 10/06/15 2317  GLUCAP 113*    Microbiology: Results for orders placed or performed during the hospital encounter of 10/01/15  Culture, blood (routine x 2)     Status: None   Collection Time: 10/01/15 11:37 AM  Result Value Ref Range Status   Specimen Description BLOOD RIGHT HAND  Final   Special Requests BOTTLES DRAWN AEROBIC AND ANAEROBIC 5CC  Final   Culture NO GROWTH 5 DAYS  Final   Report Status 10/06/2015 FINAL  Final  Culture, blood (routine x 2)     Status: None   Collection Time: 10/01/15 11:40 AM  Result Value Ref Range Status  Specimen Description BLOOD RIGHT HAND  Final   Special Requests BOTTLES DRAWN AEROBIC ONLY [place]  Final   Culture NO GROWTH 5 DAYS  Final   Report Status 10/06/2015 FINAL  Final  Culture, blood (routine x 2)     Status: None (Preliminary result)   Collection Time: 10/06/15 11:45 AM  Result Value Ref Range Status   Specimen Description BLOOD RIGHT HAND  Final   Special Requests BOTTLES DRAWN AEROBIC AND ANAEROBIC 8CC  Final   Culture NO GROWTH 3 DAYS  Final   Report Status PENDING  Incomplete  Culture, blood (routine x 2)     Status: None (Preliminary result)   Collection Time: 10/06/15 12:00 PM  Result Value Ref Range Status   Specimen Description BLOOD RIGHT ARM  Final   Special Requests IN PEDIATRIC BOTTLE 4CC  Final   Culture NO GROWTH 3 DAYS  Final   Report Status PENDING  Incomplete  Culture, blood (routine x 2)     Status: None (Preliminary result)   Collection Time: 10/07/15 10:12 AM  Result Value Ref Range Status   Specimen Description BLOOD RIGHT HAND  Final   Special Requests BOTTLES DRAWN AEROBIC AND ANAEROBIC  5CC  Final   Culture NO GROWTH 2 DAYS  Final   Report Status PENDING  Incomplete  Culture, blood (routine x 2)     Status: None (Preliminary result)   Collection Time: 10/07/15 10:20 AM  Result Value Ref Range Status   Specimen Description BLOOD RIGHT ARM  Final   Special Requests IN PEDIATRIC BOTTLE 1CC  Final   Culture NO GROWTH 2 DAYS  Final   Report Status PENDING  Incomplete    Coagulation Studies: No results for input(s): LABPROT, INR in the last 72 hours.  Urinalysis: No results for input(s): COLORURINE, LABSPEC, PHURINE, GLUCOSEU, HGBUR, BILIRUBINUR, KETONESUR, PROTEINUR, UROBILINOGEN, NITRITE, LEUKOCYTESUR in the last 72 hours.  Invalid input(s): APPERANCEUR    Imaging: Ct Elbow Right Wo Contrast  10/07/2015  CLINICAL DATA:  Right elbow pain and swelling. Pain with blood pressure cuff inflation. Prior antecubital IV in this arm. EXAM: CT OF THE RIGHT ELBOW WITHOUT CONTRAST TECHNIQUE: Multidetector CT imaging was performed according to the standard protocol. Multiplanar CT image reconstructions were also generated. COMPARISON:  None. FINDINGS: Considerable osteoarthritis with prominent spurring of the coronoid process, olecranon, humeral condyles, capitellum, and mild spurring of the radial head. Degenerative subcortical cystic lesions are present along the radial head and capitellum, as shown on images 49 through 62 series 10. These lesions are somewhat confluent. I do not observe a fracture.  There is an elbow joint effusion. Subcutaneous stranding is present along the elbow especially volar and medial. There is also subcutaneous edema tracking along the superficial fascia margin in the proximal forearm, particularly along the humeral head of the pronator teres and flexor carpi radialis, and to a lesser degree along the brachioradialis. I do not see a discrete abscess. There is some expansion of the cephalic vein in the antecubital region, and thrombosis of the cephalic vein in this  vicinity is not excluded. No obvious cubital tunnel impingement. Biceps and triceps tendons appear intact distally. IMPRESSION: 1. Expansion and some increased density within the distal cephalic vein in the antecubital region, possibly thrombosed. 2. There is some surrounding edema in the subcutaneous tissues of the antecubital region and tracking along the superficial fascia margins of the proximal forearm musculature. I do not see a discrete abscess. 3. Prominent degenerative arthropathy in the elbow  with an elbow joint effusion. 4. Confluent degenerative subcortical cystic lesions in the radio-capitellar articulation. Electronically Signed   By: Van Clines M.D.   On: 10/07/2015 21:28     Medications:     . amLODipine  5 mg Oral q morning - 10a  . calcitRIOL  0.5 mcg Oral Daily  . calcium acetate  1,334 mg Oral TID WC  . carvedilol  12.5 mg Oral Daily  . clindamycin  300 mg Oral Q6H  . darbepoetin (ARANESP) injection - DIALYSIS  150 mcg Intravenous Q Sat-HD  . methylPREDNISolone  4 mg Oral 4X daily taper   acetaminophen **OR** acetaminophen, diazepam, hydrALAZINE, metoprolol, morphine injection, polyethylene glycol, prochlorperazine  Assessment/ Plan:  Assessment/Plan: 55 year old black male with progressive renal insufficiency now with uremic symptoms  1.Renal- advanced and progressive chronic kidney disease. He is now at ESRD.  MWF second shift  2.Hemoptysis - Workup per primary team  3. HTN/volume- BP well controled. UPO poor 0.3L in 24 hrs. Possibly presented slightly volume overloaded. Continue Norvasc and Coreg  4. Anemia - doesn't appear to have been on ESA as outpatient. Hemoglobin improved at 8.6. Iron stores low- replacing and ESA started 5. Bones- PTH over 500- cacitriol and phoslo  6. Elbow pain - concern for gout.  Improved with medrol dose    LOS: 8 [place] Fines W @TODAY @11 :45 AM

## 2015-10-09 NOTE — Evaluation (Signed)
Clinical/Bedside Swallow Evaluation Patient Details  Name: [name] MRN: [id number] Date of Birth: December 07, 1960  Today's Date: 10/09/2015 Time: SLP Start Time (ACUTE ONLY): 41 SLP Stop Time (ACUTE ONLY): 1125 SLP Time Calculation (min) (ACUTE ONLY): 23 min  Past Medical History:  Past Medical History  Diagnosis Date  . Hyperlipidemia   . Hypertension   . Gout     STABLE  PER PT 10-22-2013  . BPH (benign prostatic hypertrophy)   . Elevated PSA   . Microhematuria   . Borderline diabetes mellitus   . Nocturia   . Arthritis     SPINE  . Wears glasses   . CKD (chronic kidney disease), stage III   . OSA (obstructive sleep apnea)     PER PT STUDY DONE 2005 (APPROX).  NON- COMPLIANT CPAP  . H/O hiatal hernia   . Diabetes mellitus without complication (HCC)     borderline on no meds   . Cancer ([place])   . Anemia 10/01/2015   Past Surgical History:  Past Surgical History  Procedure Laterality Date  . Transthoracic echocardiogram  07-03-2006    MILD - MODERATE LVH/  EF 55-60%/  GRADE I DIASTOIC DYSFUNCTION/  TRIVIAL  TR /  TRIVIAL PERICARDIAL EFFUSION  . Right hand surgery  YRS AGO  . Cardiac catheterization  10-04-2006   DR [facility]    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%/  2ndDIAGONAL 30%/  RCA 40%/  EF 60%  . Cardiac catheterization  04-07-2002  DR [facility]    NON-CRITICAL CAD/  PRESERVED LV  . Prostate biopsy N/A 10/27/2013    Procedure: BIOPSY TRANSRECTAL ULTRASONIC PROSTATE (TUBP);  Surgeon: Molli Hazard, MD;  Location: [facility];  Service: Urology;  Laterality: N/A;  . Cystoscopy w/ retrogrades Bilateral 10/27/2013    Procedure: CYSTOSCOPY WITH RETROGRADE PYELOGRAM;  Surgeon: Molli Hazard, MD;  Location: [facility];  Service: Urology;  Laterality: Bilateral;  . Robot assisted laparoscopic radical prostatectomy N/A 12/18/2013    Procedure: ROBOTIC ASSISTED LAPAROSCOPIC RADICAL PROSTATECTOMY AND INDOCYANINE GREEN DYE;  Surgeon:  Alexis Frock, MD;  Location: [facility];  Service: Urology;  Laterality: N/A;  . Lymphadenectomy Bilateral 12/18/2013    Procedure: PELVIC LYMPH NODE DISSECTION;  Surgeon: Alexis Frock, MD;  Location: [facility];  Service: Urology;  Laterality: Bilateral;  . Prostate surgery    . Av fistula placement Left 08/17/2015    Procedure: BRACHIOCEPHALIC ARTERIOVENOUS (AV) FISTULA CREATION  left arm;  Surgeon: Conrad Woonsocket, MD;  Location: [facility] OR;  Service: Vascular;  Laterality: Left;   HPI:  55 y.o. male with a Past Medical History of of chronic kidney disease stage III status post recent placement of AV fistula but not on dialysis, hypertension, sleep apnea noncompliant with sleep apnea, borderline diabetes, anxiety presents to the emergency Department chief complaint hemoptysis; CXR on 10/06/15 indicated Persistent bilateral lower lobe infiltrates; pt c/o initial globus sensation when hospitalized, but indicated this has resolved at present time and he is experiencing no dysphagia symptoms.    Assessment / Plan / Recommendation Clinical Impression   Pt's swallowing appears within functional limits without limitations; pt consumed thin-solids without overt s/s of aspiration and oropharyngeal swallow normal at this time; no f/u recommendations for ST; con't regular/thin renal diet    Aspiration Risk  No limitations    Diet Recommendation  Regular renal diet/thin liquids   Medication Administration: Whole meds with liquid    Other  Recommendations Oral Care Recommendations: Oral care BID  Follow up Recommendations  None    Frequency and Duration   n/a         Prognosis Prognosis for Safe Diet Advancement: Good      Swallow Study   General Date of Onset: 10/01/15 HPI: 55 y.o. male with a Past Medical History of of chronic kidney disease stage III status post recent placement of AV fistula but not on dialysis, hypertension, sleep apnea noncompliant with sleep apnea, borderline diabetes, anxiety  presents to the emergency Department chief complaint hemoptysis Type of Study: Bedside Swallow Evaluation Diet Prior to this Study: Regular;Thin liquids Temperature Spikes Noted: No Respiratory Status: Room air Behavior/Cognition: Alert;Cooperative;Pleasant mood Oral Cavity Assessment: Within Functional Limits Oral Care Completed by SLP: No Oral Cavity - Dentition: Adequate natural dentition Vision: Functional for self-feeding Self-Feeding Abilities: Able to feed self Patient Positioning: Upright in bed Baseline Vocal Quality: Normal Volitional Cough: Strong Volitional Swallow: Able to elicit    Oral/Motor/Sensory Function Overall Oral Motor/Sensory Function: Within functional limits   Ice Chips   n/a  Thin Liquid Thin Liquid: Within functional limits Presentation: Cup;Straw    Nectar Thick Nectar Thick Liquid: Not tested   Honey Thick Honey Thick Liquid: Not tested   Puree Puree: Within functional limits Presentation: Self Fed   Solid      Solid: Within functional limits Presentation: Self Fed        ADAMS,PAT, M.S., CCC-SLP 10/09/2015,11:44 AM

## 2015-10-09 NOTE — Discharge Instructions (Signed)
Pt's outpatient dialysis Center, [facility] on MWF.

## 2015-10-09 NOTE — Discharge Summary (Signed)
Physician Discharge Summary  Jonathan MCCLANAHAN MRN: [id number] DOB/AGE: [birth year] 55 y.o.  PCP: Myriam Jacobson, MD   Admit date: 10/01/2015 Discharge date: 10/09/2015  Discharge Diagnoses:     Principal Problem:   Hemoptysis Active Problems:   Weakness generalized   Essential hypertension, benign   CKD (chronic kidney disease) stage 3, GFR 30-59 ml/min   Prostate cancer (HCC)   Anemia   Unintentional weight loss   CKD (chronic kidney disease)   Symptomatic anemia    Follow-up recommendations Follow-up with PCP in 3-5 days , including all  additional recommended appointments as below Follow-up CBC, CMP in 3-5 days Pt's outpatient dialysis Center, [facility] on MWF Patient would need follow-up chest x-ray in 2-3 weeks to ensure resolution of pulmonary infiltrates     Current Discharge Medication List    START taking these medications   Details  calcitRIOL (ROCALTROL) 0.5 MCG capsule Take 1 capsule (0.5 mcg total) by mouth daily. Qty: 30 capsule, Refills: 0    calcium acetate (PHOSLO) 667 MG capsule Take 2 capsules (1,334 mg total) by mouth 3 (three) times daily with meals. Qty: 120 capsule, Refills: 1      CONTINUE these medications which have CHANGED   Details  diazepam (VALIUM) 5 MG tablet Take 1 tablet (5 mg total) by mouth daily as needed for anxiety. Qty: 30 tablet, Refills: 0      CONTINUE these medications which have NOT CHANGED   Details  acetaminophen (TYLENOL) 325 MG tablet Take 2 tablets (650 mg total) by mouth every 6 (six) hours as needed for mild pain (or Fever >/= 101).    amLODipine (NORVASC) 10 MG tablet Take 10 mg by mouth every morning. High Blood prussure    carvedilol (COREG) 12.5 MG tablet Take 12.5 mg by mouth daily.  Refills: 4      STOP taking these medications     VIAGRA 100 MG tablet          Discharge Condition:   Stable    Discharge Instructions Get Medicines reviewed and adjusted: Please take all your  medications with you for your next visit with your Primary MD  Please request your Primary MD to go over all hospital tests and procedure/radiological results at the follow up, please ask your Primary MD to get all Hospital records sent to his/her office.  If you experience worsening of your admission symptoms, develop shortness of breath, life threatening emergency, suicidal or homicidal thoughts you must seek medical attention immediately by calling 911 or calling your MD immediately if symptoms less severe.  You must read complete instructions/literature along with all the possible adverse reactions/side effects for all the Medicines you take and that have been prescribed to you. Take any new Medicines after you have completely understood and accpet all the possible adverse reactions/side effects.   Do not drive when taking Pain medications.   Do not take more than prescribed Pain, Sleep and Anxiety Medications  Special Instructions: If you have smoked or chewed Tobacco in the last 2 yrs please stop smoking, stop any regular Alcohol and or any Recreational drug use.  Wear Seat belts while driving.  Please note  You were cared for by a hospitalist during your hospital stay. Once you are discharged, your primary care physician will handle any further medical issues. Please note that NO REFILLS for any discharge medications will be authorized once you are discharged, as it is imperative that you return to your primary care physician (or  establish a relationship with a primary care physician if you do not have one) for your aftercare needs so that they can reassess your need for medications and monitor your lab values.     Allergies  Allergen Reactions  . Penicillins Nausea And Vomiting and Rash    Has patient had a PCN reaction causing immediate rash, facial/tongue/throat swelling, SOB or lightheadedness with hypotension: Yes      Disposition: 01-Home or Self Care   Consults:   Vascular surgery Nephrology   Significant Diagnostic Studies:  Dg Chest 2 View  10/06/2015  CLINICAL DATA:  Fever and weakness today. Chest discomfort for 2 days. EXAM: CHEST  2 VIEW COMPARISON:  Chest CT 10/01/2015. FINDINGS: The heart is enlarged but stable. Stable tortuosity and mild ectasia of the thoracic aorta. Persistent bilateral lower lobe infiltrates. No pleural effusion. The bony thorax is intact. IMPRESSION: Persistent bilateral lower lobe infiltrates. Electronically Signed   By: Marijo Sanes M.D.   On: 10/06/2015 12:34   Dg Chest 2 View  10/01/2015  CLINICAL DATA:  Hemoptysis for 5 days EXAM: CHEST  2 VIEW COMPARISON:  11/01/2014 FINDINGS: There is patchy opacity in both bases which may represent infectious infiltrate or atelectasis. Pulmonary hemorrhage can also produce this appearance. There is moderate cardiomegaly and aortic tortuosity, unchanged. There are no large effusions. Pulmonary vasculature is normal. IMPRESSION: Patchy opacities in both lung bases, possibly infectious infiltrate versus hemorrhage. Unchanged cardiomegaly. Electronically Signed   By: Andreas Newport M.D.   On: 10/01/2015 04:59   Ct Chest Wo Contrast  10/01/2015  CLINICAL DATA:  Diffuse weakness.  Hemoptysis EXAM: CT CHEST WITHOUT CONTRAST TECHNIQUE: Multidetector CT imaging of the chest was performed following the standard protocol without IV contrast. COMPARISON:  None FINDINGS: THORACIC INLET/BODY WALL: Gynecomastia.  No acute finding. MEDIASTINUM: Cardiomegaly. No pericardial effusion. No acute vascular abnormality. No adenopathy. LUNG WINDOWS: Symmetric ground-glass and consolidative opacities in the dependent lungs with airway and septal thickening associated. No generalized septal thickening suggestive of edema. No pleural effusion. No primary cause of hemoptysis is seen, including bronchiectasis, cavitary lesion, or endobronchial lesion. UPPER ABDOMEN: Symmetric renal atrophy.  No acute finding  OSSEOUS: No acute fracture.  No suspicious lytic or blastic lesions. IMPRESSION: Bibasilar airspace disease, at least partially alveolar hemorrhage given the history. Superimposed pneumonia cannot be excluded. No definitive cause of hemoptysis is seen. Given renal failure question pulmonary renal syndrome. Electronically Signed   By: Monte Fantasia M.D.   On: 10/01/2015 10:16   US Renal  10/01/2015  CLINICAL DATA:  Chronic renal disease. EXAM: RENAL / URINARY TRACT ULTRASOUND COMPLETE COMPARISON:  Ultrasound 01/12/2015. FINDINGS: Right Kidney: Length: 8.7 cm. The kidney is again noted to be echogenic consistent chronic medical renal disease. No mass or hydronephrosis visualized. Left Kidney: Length: 9.1 cm. The kidney is again noted to be echogenic consistent chronic medical renal disease. No mass or hydronephrosis visualized. Bladder: Appears normal for degree of bladder distention. IMPRESSION: 1. Echogenic kidneys are again noted consistent chronic medical renal disease. 2. No evidence of hydronephrosis or bladder distention. No acute abnormality identified. Electronically Signed   By: Marcello Moores  Register   On: 10/01/2015 16:00   Ct Elbow Right Wo Contrast  10/07/2015  CLINICAL DATA:  Right elbow pain and swelling. Pain with blood pressure cuff inflation. Prior antecubital IV in this arm. EXAM: CT OF THE RIGHT ELBOW WITHOUT CONTRAST TECHNIQUE: Multidetector CT imaging was performed according to the standard protocol. Multiplanar CT image reconstructions were also generated.  COMPARISON:  None. FINDINGS: Considerable osteoarthritis with prominent spurring of the coronoid process, olecranon, humeral condyles, capitellum, and mild spurring of the radial head. Degenerative subcortical cystic lesions are present along the radial head and capitellum, as shown on images 49 through 62 series 10. These lesions are somewhat confluent. I do not observe a fracture.  There is an elbow joint effusion. Subcutaneous stranding  is present along the elbow especially volar and medial. There is also subcutaneous edema tracking along the superficial fascia margin in the proximal forearm, particularly along the humeral head of the pronator teres and flexor carpi radialis, and to a lesser degree along the brachioradialis. I do not see a discrete abscess. There is some expansion of the cephalic vein in the antecubital region, and thrombosis of the cephalic vein in this vicinity is not excluded. No obvious cubital tunnel impingement. Biceps and triceps tendons appear intact distally. IMPRESSION: 1. Expansion and some increased density within the distal cephalic vein in the antecubital region, possibly thrombosed. 2. There is some surrounding edema in the subcutaneous tissues of the antecubital region and tracking along the superficial fascia margins of the proximal forearm musculature. I do not see a discrete abscess. 3. 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 4. Confluent degenerative subcortical cystic lesions in the radio-capitellar articulation. Electronically Signed   By: Van Clines M.D.   On: 10/07/2015 21:28         Filed Weights   10/08/15 0651 10/08/15 1420 10/08/15 1807  Weight: 113.127 kg (249 lb 6.4 oz) 114.2 kg (251 lb 12.3 oz) 110.9 kg (244 lb 7.8 oz)     Microbiology: Recent Results (from the past 240 hour(s))  Culture, blood (routine x 2)     Status: None   Collection Time: 10/01/15 11:37 AM  Result Value Ref Range Status   Specimen Description BLOOD RIGHT HAND  Final   Special Requests BOTTLES DRAWN AEROBIC AND ANAEROBIC 5CC  Final   Culture NO GROWTH 5 DAYS  Final   Report Status 10/06/2015 FINAL  Final  Culture, blood (routine x 2)     Status: None   Collection Time: 10/01/15 11:40 AM  Result Value Ref Range Status   Specimen Description BLOOD RIGHT HAND  Final   Special Requests BOTTLES DRAWN AEROBIC ONLY [place]  Final   Culture NO GROWTH 5 DAYS  Final    Report Status 10/06/2015 FINAL  Final  Culture, blood (routine x 2)     Status: None (Preliminary result)   Collection Time: 10/06/15 11:45 AM  Result Value Ref Range Status   Specimen Description BLOOD RIGHT HAND  Final   Special Requests BOTTLES DRAWN AEROBIC AND ANAEROBIC 8CC  Final   Culture NO GROWTH 2 DAYS  Final   Report Status PENDING  Incomplete  Culture, blood (routine x 2)     Status: None (Preliminary result)   Collection Time: 10/06/15 12:00 PM  Result Value Ref Range Status   Specimen Description BLOOD RIGHT ARM  Final   Special Requests IN PEDIATRIC BOTTLE 4CC  Final   Culture NO GROWTH 2 DAYS  Final   Report Status PENDING  Incomplete  Culture, blood (routine x 2)     Status: None (Preliminary result)   Collection Time: 10/07/15 10:12 AM  Result Value Ref Range Status   Specimen Description BLOOD RIGHT HAND  Final   Special Requests BOTTLES DRAWN AEROBIC AND ANAEROBIC 5CC  Final   Culture NO GROWTH 1 DAY  Final  Report Status PENDING  Incomplete  Culture, blood (routine x 2)     Status: None (Preliminary result)   Collection Time: 10/07/15 10:20 AM  Result Value Ref Range Status   Specimen Description BLOOD RIGHT ARM  Final   Special Requests IN PEDIATRIC BOTTLE 1CC  Final   Culture NO GROWTH 1 DAY  Final   Report Status PENDING  Incomplete       Blood Culture    Component Value Date/Time   SDES BLOOD RIGHT ARM 10/07/2015 1020   SPECREQUEST IN PEDIATRIC BOTTLE 1CC 10/07/2015 1020   CULT NO GROWTH 1 DAY 10/07/2015 1020   REPTSTATUS PENDING 10/07/2015 1020      Labs: Results for orders placed or performed during the hospital encounter of 10/01/15 (from the past 48 hour(s))  Culture, blood (routine x 2)     Status: None (Preliminary result)   Collection Time: 10/07/15 10:12 AM  Result Value Ref Range   Specimen Description BLOOD RIGHT HAND    Special Requests BOTTLES DRAWN AEROBIC AND ANAEROBIC 5CC    Culture NO GROWTH 1 DAY    Report Status PENDING    Culture, blood (routine x 2)     Status: None (Preliminary result)   Collection Time: 10/07/15 10:20 AM  Result Value Ref Range   Specimen Description BLOOD RIGHT ARM    Special Requests IN PEDIATRIC BOTTLE 1CC    Culture NO GROWTH 1 DAY    Report Status PENDING   Uric acid     Status: None   Collection Time: 10/07/15 10:32 AM  Result Value Ref Range   Uric Acid, Serum 5.7 4.4 - 7.6 mg/dL  CBC     Status: Abnormal   Collection Time: 10/08/15  6:47 AM  Result Value Ref Range   WBC 11.8 (H) 4.0 - 10.5 K/uL   RBC 3.34 (L) 4.22 - 5.81 MIL/uL   Hemoglobin 8.6 (L) 13.0 - 17.0 g/dL   HCT 27.9 (L) 39.0 - 52.0 %   MCV 83.5 78.0 - 100.0 fL   MCH 25.7 (L) 26.0 - 34.0 pg   MCHC 30.8 30.0 - 36.0 g/dL   RDW 15.6 (H) 11.5 - 15.5 %   Platelets 247 150 - 400 K/uL  Procalcitonin     Status: None   Collection Time: 10/08/15  6:47 AM  Result Value Ref Range   Procalcitonin 0.88 ng/mL    Comment:        Interpretation: PCT > 0.5 ng/mL and <= 2 ng/mL: Systemic infection (sepsis) is possible, but other conditions are known to elevate PCT as well. (NOTE)         ICU PCT Algorithm               Non ICU PCT Algorithm    ----------------------------     ------------------------------         PCT < 0.25 ng/mL                 PCT < 0.1 ng/mL     Stopping of antibiotics            Stopping of antibiotics       strongly encouraged.               strongly encouraged.    ----------------------------     ------------------------------       PCT level decrease by               PCT < 0.25 ng/mL       >=  80% from peak PCT       OR PCT 0.25 - 0.5 ng/mL          Stopping of antibiotics                                             encouraged.     Stopping of antibiotics           encouraged.    ----------------------------     ------------------------------       PCT level decrease by              PCT >= 0.25 ng/mL       < 80% from peak PCT        AND PCT >= 0.5 ng/mL             Continuing antibiotics                                               encouraged.       Continuing antibiotics            encouraged.    ----------------------------     ------------------------------     PCT level increase compared          PCT > 0.5 ng/mL         with peak PCT AND          PCT >= 0.5 ng/mL             Escalation of antibiotics                                          strongly encouraged.      Escalation of antibiotics        strongly encouraged.   Renal function panel     Status: Abnormal   Collection Time: 10/08/15  6:48 AM  Result Value Ref Range   Sodium 137 135 - 145 mmol/L   Potassium 4.6 3.5 - 5.1 mmol/L   Chloride 97 (L) 101 - 111 mmol/L   CO2 23 22 - 32 mmol/L   Glucose, Bld 136 (H) 65 - 99 mg/dL   BUN 64 (H) 6 - 20 mg/dL   Creatinine, Ser 13.51 (H) 0.61 - 1.24 mg/dL   Calcium 8.9 8.9 - 10.3 mg/dL   Phosphorus 6.7 (H) 2.5 - 4.6 mg/dL   Albumin 2.9 (L) 3.5 - 5.0 g/dL   GFR calc non Af Amer 4 (L) >60 mL/min   GFR calc Af Amer 4 (L) >60 mL/min    Comment: (NOTE) The eGFR has been calculated using the CKD EPI equation. This calculation has not been validated in all clinical situations. eGFR's persistently <60 mL/min signify possible Chronic Kidney Disease.    Anion gap 17 (H) 5 - 15  CBC     Status: Abnormal   Collection Time: 10/09/15  2:52 AM  Result Value Ref Range   WBC 16.0 (H) 4.0 - 10.5 K/uL   RBC 3.54 (L) 4.22 - 5.81 MIL/uL   Hemoglobin 9.0 (L) 13.0 - 17.0 g/dL   HCT 29.8 (L) 39.0 - 52.0 %   MCV 84.2 78.0 -  100.0 fL   MCH 25.4 (L) 26.0 - 34.0 pg   MCHC 30.2 30.0 - 36.0 g/dL   RDW 15.7 (H) 11.5 - 15.5 %   Platelets 267 150 - 400 K/uL  Comprehensive metabolic panel     Status: Abnormal   Collection Time: 10/09/15  2:52 AM  Result Value Ref Range   Sodium 136 135 - 145 mmol/L   Potassium 4.1 3.5 - 5.1 mmol/L   Chloride 95 (L) 101 - 111 mmol/L   CO2 24 22 - 32 mmol/L   Glucose, Bld 143 (H) 65 - 99 mg/dL   BUN 48 (H) 6 - 20 mg/dL   Creatinine, Ser 10.44 (H) 0.61 -  1.24 mg/dL   Calcium 9.1 8.9 - 10.3 mg/dL   Total Protein 7.0 6.5 - 8.1 g/dL   Albumin 2.9 (L) 3.5 - 5.0 g/dL   AST 15 15 - 41 U/L   ALT 11 (L) 17 - 63 U/L   Alkaline Phosphatase 66 38 - 126 U/L   Total Bilirubin 0.3 0.3 - 1.2 mg/dL   GFR calc non Af Amer 5 (L) >60 mL/min   GFR calc Af Amer 6 (L) >60 mL/min    Comment: (NOTE) The eGFR has been calculated using the CKD EPI equation. This calculation has not been validated in all clinical situations. eGFR's persistently <60 mL/min signify possible Chronic Kidney Disease.    Anion gap 17 (H) 5 - 15     Lipid Panel  No results found for: CHOL, TRIG, HDL, CHOLHDL, VLDL, LDLCALC, LDLDIRECT   Lab Results  Component Value Date   HGBA1C 6.4* 09/21/2014     Lab Results  Component Value Date   CREATININE 10.44* 10/09/2015     HPI : 55 y.o. male with past medical history significant for hypertension, OSA, borderline diabetes mellitus, hyperlipidemia, gout, also status post prostatectomy. He is known to have progressive renal insufficiency followed by Dr. Florene Glen at [facility]. Creatinine most recently has been in the fours. However, there were labs done on March 20 revealing a creatinine of 11.6. Patient has been hesitant to participate in any predialysis planning. However, he did get an AV fistula placed in his left upper arm on February 28. He now presents to the hospital mostly because he had an episode of coughing up some blood. However, upon further investigation patient states that he's been very fatigued, has very little appetite, has had a weight loss and is nauseated in the mornings as well as itching. Labs showed creatinine of 16 with a BUN of 85    HOSPITAL COURSE:   Feversecondary to HCAP? small volume hemoptysis on 4/14. CT chest shows bilateral basilar scattered mixed interstitial and alveolar opacities without any evidence for mass, endobronchial lesion. His UA has blood and protein, pulmonary suspect  that the patient has Wegener's or Goodpasture's, however ANCA negative - unclear etiology, pt treated with doxycycline ,  which has been completed. Seen by pulmonary who did not recommend any broad-spectrum antibiotics during this admission. - To further investigate the patient's febrile episodes, CT of the upper extremity was performed, as flexure area tender and warm to touch, ? Found to have distal cephalic vein thrombosis confirmed on venous Doppler, no discrete abscess Superficial thrombosis likely causing patient's fever and pain Patient treated with Solu-Medrol and subsequently prednisone     Superficial thrombosis acute superficial thrombosis noted in the right basilic  Warm compresses, no anticoagulation indicated at this time   Hemoptysis - unclear etiology  but seems to be resolved at this point , no indication for inpatient bronchoscopy as per pulmonary - pt started on doxycycline, which was continued for 7 days He will need follow-up imaging in 2-3 weeks to ensure resolution of infiltrates on chest x-ray If he has further episodes of hemoptysis we'll pursue bronchoscopy, follow-up with pulmonary Cannot use fluoroquinolones due to QTC prolongation Cannot use Augmentin secondary to allergy to penicillin    ESRD - appreciate nephrology team following  He is now at ESRD. Patient has been clipped   Essential HTN - continue with home regimen Norvasc and Coreg - added hydralazine as needed    Anemia of chronic disease, ? Kidney dz - Hg 8.3, iron stores low, supplement  CBC with dialysis    Morbid obesity due to excess calories  - pt meets criteria with BMI > 35 and underlying HTN, kidney disease  - Body mass index is 36.53 kg   Discharge Exam:    Blood pressure 108/65, pulse 75, temperature 97.9 F (36.6 C), temperature source Oral, resp. rate 18, height '5\' 11"'  (1.803 m), weight 110.9 kg (244 lb 7.8 oz), SpO2 96 %.   General exam: Appears calm and  comfortable  Respiratory system: Clear to auscultation. Respiratory effort normal. Cardiovascular system: S1 & S2 heard, no rubs, gallops or clicks. No pedal edema. Gastrointestinal system: Abdomen is nondistended, soft and nontender.  Extremities: Symmetric 5 x 5 power. RUE flexure area tender and warm to touch   Follow-up Information    Follow up with ROBERTS, Sharol Given, MD. Schedule an appointment as soon as possible for a visit in 3 days.   Specialty:  Internal Medicine   Contact information:   [address] [place] [address] [phone]       Signed: Reyne Dumas 10/09/2015, 8:33 AM        Time spent >45 mins

## 2015-10-09 NOTE — Progress Notes (Signed)
Patient alert and oriented, denies pain, tele and iv d/c. D/c instruction explain and given to the patient, pt. Verbalized  Understanding. Pt. D/c per order.

## 2015-10-11 LAB — CULTURE, BLOOD (ROUTINE X 2)
Culture: NO GROWTH
Culture: NO GROWTH

## 2015-10-12 LAB — CULTURE, BLOOD (ROUTINE X 2)
CULTURE: NO GROWTH
CULTURE: NO GROWTH

## 2015-11-02 ENCOUNTER — Telehealth: Payer: Self-pay | Admitting: Pulmonary Disease

## 2015-11-02 NOTE — Telephone Encounter (Signed)
Patient returned call.  Scheduled an appointment for him with Eric Form on Thursday, 05/18 at 10:30.

## 2015-11-02 NOTE — Telephone Encounter (Signed)
After reviewing records I see that RA did see pt in the hospital- 10/05/15 and rec that he f/u here in 3-4 wks with CXR  He needs to be seen this wk with cxr- can see NP  LMTCB

## 2015-11-04 ENCOUNTER — Inpatient Hospital Stay: Payer: Commercial Managed Care - HMO | Admitting: Acute Care

## 2015-12-13 ENCOUNTER — Emergency Department (HOSPITAL_COMMUNITY): Payer: Commercial Managed Care - HMO

## 2015-12-13 ENCOUNTER — Encounter (HOSPITAL_COMMUNITY): Payer: Self-pay | Admitting: *Deleted

## 2015-12-13 ENCOUNTER — Emergency Department (HOSPITAL_COMMUNITY)
Admission: EM | Admit: 2015-12-13 | Discharge: 2015-12-13 | Disposition: A | Discharge status: Home / Self Care | Payer: Commercial Managed Care - HMO | Attending: Emergency Medicine | Admitting: Emergency Medicine

## 2015-12-13 DIAGNOSIS — N186 End stage renal disease: Secondary | ICD-10-CM | POA: Insufficient documentation

## 2015-12-13 DIAGNOSIS — Z992 Dependence on renal dialysis: Secondary | ICD-10-CM | POA: Diagnosis not present

## 2015-12-13 DIAGNOSIS — M10061 Idiopathic gout, right knee: Secondary | ICD-10-CM

## 2015-12-13 DIAGNOSIS — Z859 Personal history of malignant neoplasm, unspecified: Secondary | ICD-10-CM | POA: Diagnosis not present

## 2015-12-13 DIAGNOSIS — I120 Hypertensive chronic kidney disease with stage 5 chronic kidney disease or end stage renal disease: Secondary | ICD-10-CM | POA: Insufficient documentation

## 2015-12-13 DIAGNOSIS — Z79899 Other long term (current) drug therapy: Secondary | ICD-10-CM | POA: Diagnosis not present

## 2015-12-13 DIAGNOSIS — M25561 Pain in right knee: Secondary | ICD-10-CM | POA: Diagnosis present

## 2015-12-13 MED ORDER — PREDNISONE 5 MG (48) PO TBPK: 5.0000 mg | ORAL_TABLET | Freq: Every day | ORAL | Status: DC

## 2015-12-13 MED ORDER — HYDROMORPHONE HCL 1 MG/ML IJ SOLN
1.0000 mg | Freq: Once | INTRAMUSCULAR | Status: AC
Start: 2015-12-13 — End: 2015-12-13
  Administered 2015-12-13: 1 mg via INTRAMUSCULAR
  Filled 2015-12-13: qty 1

## 2015-12-13 MED ORDER — OXYCODONE-ACETAMINOPHEN 5-325 MG PO TABS: 1.0000 | ORAL_TABLET | Freq: Four times a day (QID) | ORAL | Status: DC | PRN

## 2015-12-13 MED ORDER — METHYLPREDNISOLONE SODIUM SUCC 125 MG IJ SOLR
125.0000 mg | Freq: Once | INTRAMUSCULAR | Status: AC
  Administered 2015-12-13: 125 mg via INTRAMUSCULAR
  Filled 2015-12-13: qty 2

## 2015-12-13 NOTE — ED Provider Notes (Signed)
TIME SEEN: By signing my name below, I, Dyke Brackett, attest that this documentation has been prepared under the direction and in the presence of [place], DO . Electronically Signed: Dyke Brackett, Scribe. 12/13/2015. 2:41 AM.   CHIEF COMPLAINT:  Chief Complaint  Patient presents with  . Leg Pain    HPI: HPI Comments:  [name] is a 55 y.o. male with PMHx of gout, HTN, HLD, End-stage renal disease on hemodialysis who was last dialyzed Friday, June 24 who presents to the Emergency Department complaining of moderate, aching right knee pain onset 4 days ago. Pt states pain is worsened by movement. Per pt, he is unable to ambulate due to the pain. No alleviating factors noted. He notes associated swelling in right knee. Pt states he has had gout in his right knee before and describes symptoms as similar. No calf tenderness or calf swelling. Per pt, he was given prednisone in past with relief.  He denies any injury, fever. He is not a diabetic. No history of PE or DVT.  ROS: See HPI Constitutional: no fever  Eyes: no drainage  ENT: no runny nose   Cardiovascular:  no chest pain  Resp: no SOB  GI: no vomiting GU: no dysuria Integumentary: no rash  Allergy: no hives  Musculoskeletal: leg swelling  Neurological: no slurred speech ROS otherwise negative  PAST MEDICAL HISTORY/PAST SURGICAL HISTORY:  Past Medical History  Diagnosis Date  . Hyperlipidemia   . Hypertension   . Gout     STABLE  PER PT 10-22-2013  . BPH (benign prostatic hypertrophy)   . Elevated PSA   . Microhematuria   . Borderline diabetes mellitus   . Nocturia   . Arthritis     SPINE  . Wears glasses   . CKD (chronic kidney disease), stage III   . OSA (obstructive sleep apnea)     PER PT STUDY DONE 2005 (APPROX).  NON- COMPLIANT CPAP  . H/O hiatal hernia   . Diabetes mellitus without complication (HCC)     borderline on no meds   . Cancer ([place])   . Anemia 10/01/2015    MEDICATIONS:  Prior to  Admission medications   Medication Sig Start Date End Date Taking? Authorizing Provider  acetaminophen (TYLENOL) 325 MG tablet Take 2 tablets (650 mg total) by mouth every 6 (six) hours as needed for mild pain (or Fever >/= 101). Patient taking differently: Take 650 mg by mouth daily as needed for mild pain (or Fever >/= 101).  09/24/14  Yes Delfina Redwood, MD  calcitRIOL (ROCALTROL) 0.5 MCG capsule Take 1 capsule (0.5 mcg total) by mouth daily. 10/09/15  Yes Reyne Dumas, MD  calcium acetate (PHOSLO) 667 MG capsule Take 2 capsules (1,334 mg total) by mouth 3 (three) times daily with meals. 10/09/15  Yes Reyne Dumas, MD  diazepam (VALIUM) 5 MG tablet Take 1 tablet (5 mg total) by mouth daily as needed for anxiety. 10/09/15  Yes Reyne Dumas, MD  multivitamin (RENA-VIT) TABS tablet Take 1 tablet by mouth daily.   Yes Historical Provider, MD  PRESCRIPTION MEDICATION Take 1 tablet by mouth daily. Tablet for bones   Yes Historical Provider, MD  prochlorperazine (COMPAZINE) 10 MG tablet Take 1 tablet (10 mg total) by mouth every 6 (six) hours as needed for nausea or vomiting. 10/09/15  Yes Reyne Dumas, MD    ALLERGIES:  Allergies  Allergen Reactions  . Penicillins Nausea And Vomiting and Rash    Has patient  had a PCN reaction causing immediate rash, facial/tongue/throat swelling, SOB or lightheadedness with hypotension: Yes    SOCIAL HISTORY:  Social History  Substance Use Topics  . Smoking status: Never Smoker   . Smokeless tobacco: Never Used  . Alcohol Use: No    FAMILY HISTORY: Family History  Problem Relation Age of Onset  . Cirrhosis Father   . Other Mother     shot and killed    EXAM: BP 118/85 mmHg  Pulse 92  Temp(Src) 98.5 F (36.9 C) (Oral)  Resp 20  Ht 5\' 11"  (1.803 m)  Wt 233 lb (105.688 kg)  BMI 32.51 kg/m2  SpO2 100% CONSTITUTIONAL: Alert and oriented and responds appropriately to questions. Well-appearing; well-nourished HEAD: Normocephalic EYES: Conjunctivae  clear, PERRL ENT: normal nose; no rhinorrhea; moist mucous membranes NECK: Supple, no meningismus, no LAD  CARD: RRR; S1 and S2 appreciated; no murmurs, no clicks, no rubs, no gallops RESP: Normal chest excursion without splinting or tachypnea; breath sounds clear and equal bilaterally; no wheezes, no rhonchi, no rales, no hypoxia or respiratory distress, speaking full sentences ABD/GI: Normal bowel sounds; non-distended; soft, non-tender, no rebound, no guarding, no peritoneal signs BACK:  The back appears normal and is non-tender to palpation, there is no CVA tenderness EXT: Tender to palpation diffusely over the right knee without large joint effusion, erythema or warmth. No calf tenderness or swelling on exam. Compartments are soft. 2+ DP pulses bilaterally. No bony deformity on exam. Decreased range of motion in the right knee secondary to pain. Otherwise Normal ROM in all joints; otherwise x-rays are non-tender to palpation; no edema; normal capillary refill; AV fistula and left upper extremity with good thrill and no surrounding erythema or warmth or tenderness SKIN: Normal color for age and race; warm; no rash NEURO: Moves all extremities equally, sensation to light touch intact diffusely, cranial nerves II through XII intact PSYCH: The patient's mood and manner are appropriate. Grooming and personal hygiene are appropriate.  MEDICAL DECISION MAKING: Patient here with what appears to be gout in the right knee. No sign of septic arthritis on exam. No calf tenderness or swelling to suggest DVT. His Wells score would be -2.  No history of injury. X-ray shows no fracture, effusion. He states he has only been on dialysis for 3 months. We'll stay away from colchicine, allopurinol and NSAIDs because of the possibility of preserving renal function. Will discharge on steroid taper. We'll give IM Dilaudid and Solu-Medrol in the emergency department. We'll discharge with prescription for Percocet, crutches  to use as needed. He does have a PCP for follow-up. States he has dialysis scheduled in the morning. Discussed return precautions. He verbalizes understanding and is comfortable with this plan.    At this time, I do not feel there is any life-threatening condition present. I have reviewed and discussed all results (EKG, imaging, lab, urine as appropriate), exam findings with patient. I have reviewed nursing notes and appropriate previous records.  I feel the patient is safe to be discharged home without further emergent workup. Discussed usual and customary return precautions. Patient and family (if present) verbalize understanding and are comfortable with this plan.  Patient will follow-up with their primary care provider. If they do not have a primary care provider, information for follow-up has been provided to them. All questions have been answered.     [place], DO 12/13/15 [phone]

## 2015-12-13 NOTE — ED Notes (Signed)
The pt is c/o rt knee pain for 4 days  nno known injury  His rt knee is painful and swollen and  His rt calf is painful  Dialysis pt due this am    Fistula lt arm

## 2015-12-13 NOTE — Discharge Instructions (Signed)

## 2016-01-18 ENCOUNTER — Encounter: Payer: Self-pay | Admitting: Pulmonary Disease

## 2016-01-18 ENCOUNTER — Telehealth: Payer: Self-pay | Admitting: Pulmonary Disease

## 2016-01-18 ENCOUNTER — Ambulatory Visit (INDEPENDENT_AMBULATORY_CARE_PROVIDER_SITE_OTHER): Payer: Commercial Managed Care - HMO | Admitting: Pulmonary Disease

## 2016-01-18 ENCOUNTER — Ambulatory Visit (INDEPENDENT_AMBULATORY_CARE_PROVIDER_SITE_OTHER)
Admission: RE | Admit: 2016-01-18 | Discharge: 2016-01-18 | Disposition: A | Discharge status: Home / Self Care | Payer: Commercial Managed Care - HMO | Source: Ambulatory Visit | Attending: Pulmonary Disease | Admitting: Pulmonary Disease

## 2016-01-18 VITALS — BP 110/82 | HR 93 | Ht 70.0 in | Wt 235.8 lb

## 2016-01-18 DIAGNOSIS — R042 Hemoptysis: Secondary | ICD-10-CM

## 2016-01-18 DIAGNOSIS — G4733 Obstructive sleep apnea (adult) (pediatric): Secondary | ICD-10-CM | POA: Diagnosis not present

## 2016-01-18 NOTE — Telephone Encounter (Signed)
Rigoberto Noel, MD  Glean Hess, CMA        Clear lungs    I spoke with patient about results and he verbalized understanding and had no questions

## 2016-01-18 NOTE — Assessment & Plan Note (Signed)
Has resolved-likely related to fluid overload in the setting of kidney disease  Follow-up chest x-ray today for resolution of infiltrates noted on hospital admission

## 2016-01-18 NOTE — Patient Instructions (Signed)
CXR today.  

## 2016-01-18 NOTE — Assessment & Plan Note (Signed)
Must have resolved with his weight loss -Consider repeat sleep testing if he has symptoms

## 2016-01-18 NOTE — Telephone Encounter (Signed)
Pt calling again to speak to nurse.[name] ° °

## 2016-01-18 NOTE — Progress Notes (Signed)
   Subjective:    Patient ID: [name], male    DOB: [birth year], 55 y.o.   MRN: [id number]  HPI  55 yo ,obese(270 lbs) ESRD, adm 09/2015  with one episode of 20 cc of bright red hemoptysis 4/14  He has never smoked or had hemoptysis in the past He worked as a Retail buyer at [employer] chest shows bilateral basilar scattered mixed interstitial and alveolar opacities   Chief Complaint  Patient presents with  . Hospitalization Follow-up    pt feels better from hospital visit.  taking dialysis now.  wants to make sure his lungs are okay.    Hemoptysis has not recurred He complains of generalized weakness, otherwise tolerating dialysis well No cough, wheezing or dyspnea Wife reports food getting stuck in his throat, no problems with liquids He has lost about 20-30 pounds of the starting dialysis  Significant tests/ events  CT spine 10/2015 >Multilevel disc disease with narrowing of the spinal canal  Review of Systems Patient denies significant dyspnea,cough, hemoptysis,  chest pain, palpitations, pedal edema, orthopnea, paroxysmal nocturnal dyspnea, lightheadedness, nausea, vomiting, abdominal or  leg pains      Objective:   Physical Exam   Gen. Pleasant, obese, in no distress ENT - no lesions, no post nasal drip Neck: No JVD, no thyromegaly, no carotid bruits Lungs: no use of accessory muscles, no dullness to percussion, decreased without rales or rhonchi  Cardiovascular: Rhythm regular, heart sounds  normal, no murmurs or gallops, no peripheral edema Musculoskeletal: No deformities, no cyanosis or clubbing , no tremors        Assessment & Plan:

## 2016-02-22 ENCOUNTER — Emergency Department (HOSPITAL_COMMUNITY)
Admission: EM | Admit: 2016-02-22 | Discharge: 2016-02-22 | Disposition: A | Discharge status: Home / Self Care | Payer: Commercial Managed Care - HMO | Attending: Emergency Medicine | Admitting: Emergency Medicine

## 2016-02-22 ENCOUNTER — Encounter (HOSPITAL_COMMUNITY): Payer: Self-pay

## 2016-02-22 ENCOUNTER — Emergency Department (HOSPITAL_COMMUNITY): Payer: Commercial Managed Care - HMO

## 2016-02-22 DIAGNOSIS — E119 Type 2 diabetes mellitus without complications: Secondary | ICD-10-CM | POA: Insufficient documentation

## 2016-02-22 DIAGNOSIS — N186 End stage renal disease: Secondary | ICD-10-CM | POA: Insufficient documentation

## 2016-02-22 DIAGNOSIS — Z79899 Other long term (current) drug therapy: Secondary | ICD-10-CM | POA: Diagnosis not present

## 2016-02-22 DIAGNOSIS — M545 Low back pain, unspecified: Secondary | ICD-10-CM

## 2016-02-22 DIAGNOSIS — Z992 Dependence on renal dialysis: Secondary | ICD-10-CM | POA: Diagnosis not present

## 2016-02-22 DIAGNOSIS — Z8546 Personal history of malignant neoplasm of prostate: Secondary | ICD-10-CM | POA: Diagnosis not present

## 2016-02-22 DIAGNOSIS — I120 Hypertensive chronic kidney disease with stage 5 chronic kidney disease or end stage renal disease: Secondary | ICD-10-CM | POA: Diagnosis not present

## 2016-02-22 DIAGNOSIS — M25461 Effusion, right knee: Secondary | ICD-10-CM | POA: Diagnosis not present

## 2016-02-22 DIAGNOSIS — M160 Bilateral primary osteoarthritis of hip: Secondary | ICD-10-CM

## 2016-02-22 MED ORDER — PREDNISONE 20 MG PO TABS: 40.0000 mg | ORAL_TABLET | Freq: Every day | ORAL | 0 refills | Status: DC

## 2016-02-22 MED ORDER — HYDROMORPHONE HCL 1 MG/ML IJ SOLN
1.0000 mg | Freq: Once | INTRAMUSCULAR | Status: AC
  Administered 2016-02-22: 1 mg via INTRAVENOUS
  Filled 2016-02-22: qty 1

## 2016-02-22 MED ORDER — OXYCODONE-ACETAMINOPHEN 5-325 MG PO TABS: 1.0000 | ORAL_TABLET | Freq: Three times a day (TID) | ORAL | 0 refills | Status: DC | PRN

## 2016-02-22 MED ORDER — KETOROLAC TROMETHAMINE 60 MG/2ML IM SOLN: 60.0000 mg | Freq: Once | INTRAMUSCULAR | Status: DC

## 2016-02-22 MED ORDER — OXYCODONE-ACETAMINOPHEN 5-325 MG PO TABS: 1.0000 | ORAL_TABLET | Freq: Once | ORAL | Status: DC

## 2016-02-22 MED ORDER — OXYCODONE-ACETAMINOPHEN 5-325 MG PO TABS: 2.0000 | ORAL_TABLET | Freq: Once | ORAL | Status: DC

## 2016-02-22 MED ORDER — PREDNISONE 20 MG PO TABS
60.0000 mg | ORAL_TABLET | Freq: Once | ORAL | Status: AC
  Administered 2016-02-22: 60 mg via ORAL
  Filled 2016-02-22: qty 3

## 2016-02-22 NOTE — ED Notes (Signed)
Pt wheeled to waiting room to wait for ride home. Pt was given d/c instructions by PA [place]. Verbalizes understanding, denies further questions. NAD. Ambulatory with crutches.

## 2016-02-22 NOTE — ED Notes (Signed)
Pt still at imaging.

## 2016-02-22 NOTE — ED Notes (Signed)
Two RN's attempted IV access, not successful.

## 2016-02-22 NOTE — ED Provider Notes (Signed)
[facility] DEPT Provider Note   CSN: [id number] Arrival date & time: 02/22/16  0231     History   Chief Complaint Chief Complaint  Patient presents with  . Back Pain    HPI [name] is a 55 y.o. male.  HPI   Pt is a 55 y/o male with pmhx of ESRD on HD, DM, HTN, HLD, arthritis, gout, He presented to the emergency room complaining of 2 days of rapidly worsening low back pain and right knee pain. He states that his low back pain is severe, described as shooting, radiates from his low back around bilateral hips to his groin.  He has not have any worsening pain when rotation of his leg is joint however has worsening pain with movement and bending at the waist, or attempting to sit down or stand up.  He denies any injury or strain preceded his worsening pain. It is rated 10/10, making it hard for him to move at all, and tonight he was unable to sleep because of pain.  He also has right knee pain with swelling.  Unable to bear weight or flex leg secondary to pain and swelling.  He reports a history of gout and right knee effusion, this is similar to past flares and swelling.  It is not red or hot, and the skin is not sensitive to touch, but pain is worse with standing or movement of his knee.  Denies chest pain, shortness of breath, abdominal pain, urinary or bowel incontinence.  No lower extremity numbness or weakness.  No fever, chills, sweats, SOB, near syncope.  Past Medical History:  Diagnosis Date  . Anemia 10/01/2015  . Arthritis    SPINE  . Borderline diabetes mellitus   . BPH (benign prostatic hypertrophy)   . Cancer ([place])   . CKD (chronic kidney disease), stage III   . Diabetes mellitus without complication (HCC)    borderline on no meds   . Elevated PSA   . Gout    STABLE  PER PT 10-22-2013  . H/O hiatal hernia   . Hyperlipidemia   . Hypertension   . Microhematuria   . Nocturia   . OSA (obstructive sleep apnea)    PER PT STUDY DONE 2005 (APPROX).  NON- COMPLIANT  CPAP  . Wears glasses     Patient Active Problem List   Diagnosis Date Noted  . Symptomatic anemia   . CKD (chronic kidney disease)   . Hemoptysis 10/01/2015  . Anemia 10/01/2015  . Unintentional weight loss 10/01/2015  . OSA (obstructive sleep apnea)   . Baker's cyst of knee   . Joint effusion, knee   . Effusion of right knee 09/21/2014  . Epididymitis 09/21/2014  . Prostate cancer ([place]) 12/18/2013  . Weakness generalized 05/30/2013  . Prolonged QT interval 05/30/2013  . Other and unspecified hyperlipidemia 05/30/2013  . Essential hypertension, benign 05/30/2013  . BPH (benign prostatic hypertrophy) 05/30/2013  . Acute gout 05/30/2013  . CKD (chronic kidney disease) stage 3, GFR 30-59 ml/min 05/30/2013    Past Surgical History:  Procedure Laterality Date  . AV FISTULA PLACEMENT Left 08/17/2015   Procedure: BRACHIOCEPHALIC ARTERIOVENOUS (AV) FISTULA CREATION  left arm;  Surgeon: Conrad Bobtown, MD;  Location: [facility];  Service: Vascular;  Laterality: Left;  . 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 CARDIAC CATHETERIZATION  04-07-2002  DR [facility]   NON-CRITICAL CAD/  PRESERVED LV  . CYSTOSCOPY W/ RETROGRADES Bilateral 10/27/2013   Procedure: CYSTOSCOPY WITH RETROGRADE PYELOGRAM;  Surgeon: Molli Hazard, MD;  Location[facility];  Service: Urology;  Laterality: Bilateral;  . LYMPHADENECTOMY Bilateral 12/18/2013   Procedure: PELVIC LYMPH NODE DISSECTION;  Surgeon: Alexis Frock, MD;  Location: [facility];  Service: Urology;  Laterality: Bilateral;  . PROSTATE BIOPSY N/A 10/27/2013   Procedure: BIOPSY TRANSRECTAL ULTRASONIC PROSTATE (TUBP);  Surgeon: Molli Hazard, MD;  Location[facility];  Service: Urology;  Laterality: N/A;  . PROSTATE SURGERY    . RIGHT HAND SURGERY  YRS AGO  . ROBOT ASSISTED LAPAROSCOPIC RADICAL PROSTATECTOMY N/A 12/18/2013   Procedure:  ROBOTIC ASSISTED LAPAROSCOPIC RADICAL PROSTATECTOMY AND INDOCYANINE GREEN DYE;  Surgeon: Alexis Frock, MD;  Location: [facility];  Service: Urology;  Laterality: N/A;  . TRANSTHORACIC ECHOCARDIOGRAM  07-03-2006   MILD - MODERATE LVH/  EF 55-60%/  GRADE I DIASTOIC DYSFUNCTION/  TRIVIAL  TR /  TRIVIAL PERICARDIAL EFFUSION       Home Medications    Prior to Admission medications   Medication Sig Start Date End Date Taking? Authorizing Provider  calcitRIOL (ROCALTROL) 0.5 MCG capsule Take 1 capsule (0.5 mcg total) by mouth daily. 10/09/15  Yes Reyne Dumas, MD  calcium acetate (PHOSLO) 667 MG capsule Take 2 capsules (1,334 mg total) by mouth 3 (three) times daily with meals. 10/09/15  Yes Reyne Dumas, MD  multivitamin (RENA-VIT) TABS tablet Take 1 tablet by mouth daily.   Yes Historical Provider, MD  oxyCODONE-acetaminophen (PERCOCET/ROXICET) 5-325 MG tablet Take 1-2 tablets by mouth every 6 (six) hours as needed. Patient taking differently: Take 1-2 tablets by mouth every 6 (six) hours as needed.  12/13/15  Yes Kristen N Ward, DO  SENSIPAR 30 MG tablet Take 1 capsule by mouth daily. 12/08/15  Yes Historical Provider, MD    Family History Family History  Problem Relation Age of Onset  . Other Mother     shot and killed  . Cirrhosis Father     Social History Social History  Substance Use Topics  . Smoking status: Never Smoker  . Smokeless tobacco: Never Used  . Alcohol use No     Allergies   Penicillins   Review of Systems Review of Systems  All other systems reviewed and are negative.    Physical Exam Updated Vital Signs BP 143/90   Pulse 95   Temp 98.8 F (37.1 C) (Oral)   Resp 18   Ht 5\' 10"  (1.778 m)   Wt 104.3 kg   SpO2 100%   BMI 33.00 kg/m   Physical Exam  Constitutional: He is oriented to person, place, and time. He appears well-developed and well-nourished. He is cooperative.  Non-toxic appearance. He does not have a sickly appearance. No distress.  HENT:    Head: Normocephalic and atraumatic.  Right Ear: External ear normal.  Left Ear: External ear normal.  Nose: Nose normal.  Mouth/Throat: Oropharynx is clear and moist. No oropharyngeal exudate.  Eyes: Conjunctivae and EOM are normal. Pupils are equal, round, and reactive to light. Right eye exhibits no discharge. Left eye exhibits no discharge. No scleral icterus.  Neck: Normal range of motion. Neck supple. No JVD present. No tracheal deviation present.  Cardiovascular: Normal rate, regular rhythm and normal heart sounds.   Pulmonary/Chest: Effort normal and breath sounds normal. No stridor. No respiratory distress.  Abdominal: Soft. Normal appearance and bowel sounds are normal. He exhibits no distension and no  pulsatile midline mass. There is no tenderness.  Musculoskeletal: He exhibits edema and tenderness.       Right knee: He exhibits decreased range of motion, swelling and effusion. He exhibits no ecchymosis, no deformity, no laceration, no erythema and normal patellar mobility.       Cervical back: Normal.       Thoracic back: Normal.       Lumbar back: He exhibits tenderness. He exhibits normal range of motion, no bony tenderness, no swelling, no edema, no deformity and no laceration.  Right knee swollen, no erythema, no warmth, large effusion, diffusely tender with palpation, light touch of skin non-tender, decreased flexion secondary to pain and swelling, able to extend fully, and flex to 90 degrees.  Lymphadenopathy:    He has no cervical adenopathy.  Neurological: He is alert and oriented to person, place, and time. He exhibits normal muscle tone. Coordination normal.  Skin: Skin is warm and dry. No rash noted. He is not diaphoretic. No erythema. No pallor.  Psychiatric: He has a normal mood and affect. His behavior is normal. Judgment and thought content normal.  Nursing note and vitals reviewed.    ED Treatments / Results  Labs (all labs ordered are listed, but only abnormal  results are displayed) Labs Reviewed - No data to display  EKG  EKG Interpretation None       Radiology No results found.  Procedures Procedures (including critical care time)  Medications Ordered in ED Medications  predniSONE (DELTASONE) tablet 60 mg (not administered)  HYDROmorphone (DILAUDID) injection 1 mg (not administered)     Initial Impression / Assessment and Plan / ED Course  I have reviewed the triage vital signs and the nursing notes.  Pertinent labs & imaging results that were available during my care of the patient were reviewed by me and considered in my medical decision making (see chart for details).  Clinical Course   ESRD HD pt with hx of gout, right knee effusion, and arthritis presents to the ER with severe gradual onset low back pain with radiation to legs, worse with movement, and right knee pain with swelling, similar to past knee pain.   No neurological deficits and normal neuro exam.  Patient can walk but states is painful.  No loss of bowel or bladder control.  No concern for cauda equina.  No fever, night sweats, weight loss, h/o cancer, IVDU.  Imagine obtained to r/o acute pathology in lumbar spine, although suspect sciatica.  Right knee with large effusion, no erythema, no warmth, decreased ROM secondary to pain. Chart reviewed and past synovial fluid testing was negative for gout.  Suspect arthritis flare, does not appear septic with lack of erythema, warmth, pt is afebrile, he has good ROM of knee, limited flexion slightly because of large effusion.  Will give steroids, apply ace wrap, give crutches and pain meds.  May have therapeutic benefit is drained, given ortho referral.  L-spine plain films negative for actue abnormality, has not neuro deficit or incontinence, do not feel emergent MRI indicated.  Knee films negative for acute osseous abnormality, + for degenerative osteoarthritis with joint effusion.  Discharged home in good condition, pain  meds, steroids and ortho and PCP follow up.    RICE protocol and pain medicine indicated and discussed with patient.    Final Clinical Impressions(s) / ED Diagnoses   Final diagnoses:  Effusion of right knee  Bilateral low back pain without sciatica  Osteoarthritis of both hips, unspecified osteoarthritis type  New Prescriptions Discharge Medication List as of 02/22/2016  6:25 AM    START taking these medications   Details  !! oxyCODONE-acetaminophen (PERCOCET) 5-325 MG tablet Take 1 tablet by mouth every 8 (eight) hours as needed for severe pain., Starting Tue 02/22/2016, Print    predniSONE (DELTASONE) 20 MG tablet Take 2 tablets (40 mg total) by mouth daily., Starting Tue 02/22/2016, Print     !! - Potential duplicate medications found. Please discuss with provider.       Delsa Grana, PA-C 02/25/16 [place], DO 02/27/16 2312

## 2016-02-22 NOTE — ED Notes (Signed)
Applied Ace Wrap to pt's knee.

## 2016-02-22 NOTE — ED Triage Notes (Signed)
Pt states that he has a spinal injury from years ago, back pain has been worse over the past three days. Pain shoots down his legs. Pt also c/o of r knee pain, states he has a hx of gout and thinks it is acting up.

## 2016-02-24 ENCOUNTER — Other Ambulatory Visit (HOSPITAL_COMMUNITY): Payer: Self-pay | Admitting: Internal Medicine

## 2016-02-24 DIAGNOSIS — M7989 Other specified soft tissue disorders: Principal | ICD-10-CM

## 2016-02-24 DIAGNOSIS — M79605 Pain in left leg: Secondary | ICD-10-CM

## 2016-02-28 ENCOUNTER — Encounter (HOSPITAL_COMMUNITY): Payer: Self-pay

## 2016-02-28 ENCOUNTER — Ambulatory Visit (HOSPITAL_COMMUNITY): Payer: Commercial Managed Care - HMO | Attending: Internal Medicine

## 2016-07-16 ENCOUNTER — Emergency Department (HOSPITAL_COMMUNITY)
Admission: EM | Admit: 2016-07-16 | Discharge: 2016-07-16 | Disposition: A | Discharge status: Home / Self Care | Payer: Commercial Managed Care - HMO | Attending: Emergency Medicine | Admitting: Emergency Medicine

## 2016-07-16 ENCOUNTER — Encounter (HOSPITAL_COMMUNITY): Payer: Self-pay | Admitting: Emergency Medicine

## 2016-07-16 ENCOUNTER — Emergency Department (HOSPITAL_BASED_OUTPATIENT_CLINIC_OR_DEPARTMENT_OTHER)
Admit: 2016-07-16 | Discharge: 2016-07-16 | Disposition: A | Discharge status: Home / Self Care | Payer: Commercial Managed Care - HMO | Attending: Emergency Medicine | Admitting: Emergency Medicine

## 2016-07-16 DIAGNOSIS — M7122 Synovial cyst of popliteal space [Baker], left knee: Secondary | ICD-10-CM | POA: Insufficient documentation

## 2016-07-16 DIAGNOSIS — I129 Hypertensive chronic kidney disease with stage 1 through stage 4 chronic kidney disease, or unspecified chronic kidney disease: Secondary | ICD-10-CM | POA: Diagnosis not present

## 2016-07-16 DIAGNOSIS — Z8546 Personal history of malignant neoplasm of prostate: Secondary | ICD-10-CM | POA: Insufficient documentation

## 2016-07-16 DIAGNOSIS — N183 Chronic kidney disease, stage 3 (moderate): Secondary | ICD-10-CM | POA: Insufficient documentation

## 2016-07-16 DIAGNOSIS — E1122 Type 2 diabetes mellitus with diabetic chronic kidney disease: Secondary | ICD-10-CM | POA: Insufficient documentation

## 2016-07-16 DIAGNOSIS — M79609 Pain in unspecified limb: Secondary | ICD-10-CM | POA: Diagnosis not present

## 2016-07-16 DIAGNOSIS — M7989 Other specified soft tissue disorders: Secondary | ICD-10-CM | POA: Diagnosis present

## 2016-07-16 DIAGNOSIS — M25562 Pain in left knee: Secondary | ICD-10-CM

## 2016-07-16 HISTORY — DX: Dependence on renal dialysis: Z99.2

## 2016-07-16 LAB — CBC WITH DIFFERENTIAL/PLATELET
Basophils Absolute: 0 10*3/uL (ref 0.0–0.1)
Basophils Relative: 0 %
EOS ABS: 0.2 10*3/uL (ref 0.0–0.7)
Eosinophils Relative: 2 %
HEMATOCRIT: 39.4 % (ref 39.0–52.0)
HEMOGLOBIN: 12.7 g/dL — AB (ref 13.0–17.0)
LYMPHS ABS: 1.9 10*3/uL (ref 0.7–4.0)
Lymphocytes Relative: 25 %
MCH: 30 pg (ref 26.0–34.0)
MCHC: 32.2 g/dL (ref 30.0–36.0)
MCV: 92.9 fL (ref 78.0–100.0)
Monocytes Absolute: 0.8 10*3/uL (ref 0.1–1.0)
Monocytes Relative: 11 %
NEUTROS PCT: 62 %
Neutro Abs: 4.7 10*3/uL (ref 1.7–7.7)
Platelets: 173 10*3/uL (ref 150–400)
RBC: 4.24 MIL/uL (ref 4.22–5.81)
RDW: 14.5 % (ref 11.5–15.5)
WBC: 7.6 10*3/uL (ref 4.0–10.5)

## 2016-07-16 LAB — SYNOVIAL CELL COUNT + DIFF, W/ CRYSTALS
CRYSTALS FLUID: NONE SEEN
EOSINOPHILS-SYNOVIAL: 0 % (ref 0–1)
Lymphocytes-Synovial Fld: 22 % — ABNORMAL HIGH (ref 0–20)
MONOCYTE-MACROPHAGE-SYNOVIAL FLUID: 19 % — AB (ref 50–90)
Neutrophil, Synovial: 59 % — ABNORMAL HIGH (ref 0–25)
WBC, Synovial: 2040 /mm3 — ABNORMAL HIGH (ref 0–200)

## 2016-07-16 LAB — BASIC METABOLIC PANEL
ANION GAP: 14 (ref 5–15)
BUN: 43 mg/dL — ABNORMAL HIGH (ref 6–20)
CHLORIDE: 99 mmol/L — AB (ref 101–111)
CO2: 26 mmol/L (ref 22–32)
CREATININE: 11.81 mg/dL — AB (ref 0.61–1.24)
Calcium: 9.4 mg/dL (ref 8.9–10.3)
GFR calc non Af Amer: 4 mL/min — ABNORMAL LOW (ref >60)
GFR, EST AFRICAN AMERICAN: 5 mL/min — AB (ref >60)
Glucose, Bld: 87 mg/dL (ref 65–99)
POTASSIUM: 4 mmol/L (ref 3.5–5.1)
SODIUM: 139 mmol/L (ref 135–145)

## 2016-07-16 LAB — GLUCOSE, SYNOVIAL FLUID: Glucose, Synovial Fluid: 89 mg/dL

## 2016-07-16 LAB — CBG MONITORING, ED: GLUCOSE-CAPILLARY: 80 mg/dL (ref 65–99)

## 2016-07-16 MED ORDER — LIDOCAINE HCL 2 % IJ SOLN
10.0000 mL | Freq: Once | INTRAMUSCULAR | Status: DC
  Filled 2016-07-16: qty 20

## 2016-07-16 MED ORDER — OXYCODONE-ACETAMINOPHEN 5-325 MG PO TABS: 1.0000 | ORAL_TABLET | ORAL | 0 refills | Status: DC | PRN

## 2016-07-16 MED ORDER — HYDROCODONE-ACETAMINOPHEN 5-325 MG PO TABS
1.0000 | ORAL_TABLET | Freq: Once | ORAL | Status: AC
  Administered 2016-07-16: 1 via ORAL
  Filled 2016-07-16: qty 1

## 2016-07-16 NOTE — ED Triage Notes (Signed)
Pt reports 4 day hx of increasing l/leg pain and swelling. Pt described pain and tenderness on r/side of l/knee an radiates to calf..Unable to bend knee.  Pt stated that he "bumped" his knee on a trailer hitch 4 days ago. Pt was in dialysi on Friday. Did not report incident. Pt called PCP today, Directed by PCP to come to ED

## 2016-07-16 NOTE — Progress Notes (Signed)
*  PRELIMINARY RESULTS* Vascular Ultrasound Left lower extremity venous duplex has been completed.  Preliminary findings: no evidence of DVT. Fluid collection noted in left popliteal fossa, possibly a baker's cyst. A separate fluid collection noted left medial knee- area of pain.    Landry Mellow, RDMS, RVT  07/16/2016, 11:47 AM

## 2016-07-16 NOTE — Discharge Instructions (Signed)
We believe that your symptoms are caused by injury to the knee and a baker's cyst. Keep the knee wrapped for comfort and use crutches as needed.  Please read through the included information about additional care such as heating pads, over-the-counter pain medicine.  If you were provided a prescription please use it only as needed and as instructed.  Remember that early mobility and using the affected part of your body is actually better than keeping it immobile.  Follow-up with the doctor listed as recommended or return to the emergency department with new or worsening symptoms that concern you.

## 2016-07-16 NOTE — ED Provider Notes (Signed)
Emergency Department Provider Note   I have reviewed the triage vital signs and the nursing notes.   HISTORY  Chief Complaint Leg Pain and Leg Swelling   HPI [name] is a 56 y.o. male with PMH of ESRD on HD, HLD, HTN, and OSA presents to the emergency department for evaluation of left leg pain and swelling over the past 2 days. The patient describes hitting his leg on a piece of furniture 2 days prior when the pain began. He has since had worsening swelling of the left knee and pain that extends posteriorly and inferiorly to the calf on the same side. He notes a history of both Baker's cyst and gout. Pain is made significantly worse with any movement of the leg. He describes it as feeling warm but denies any fever or shaking chills. No injury to other parts of the body. Patient receives heparin during dialysis but otherwise is not anticoagulated.    Past Medical History:  Diagnosis Date  . Anemia 10/01/2015  . Arthritis    SPINE  . Borderline diabetes mellitus   . BPH (benign prostatic hypertrophy)   . Cancer ([place])   . CKD (chronic kidney disease), stage III   . Diabetes mellitus without complication (HCC)    borderline on no meds   . Dialysis patient ([place])   . Elevated PSA   . Gout    STABLE  PER PT 10-22-2013  . H/O hiatal hernia   . Hyperlipidemia   . Hypertension   . Microhematuria   . Nocturia   . OSA (obstructive sleep apnea)    PER PT STUDY DONE 2005 (APPROX).  NON- COMPLIANT CPAP  . Wears glasses     Patient Active Problem List   Diagnosis Date Noted  . Symptomatic anemia   . CKD (chronic kidney disease)   . Hemoptysis 10/01/2015  . Anemia 10/01/2015  . Unintentional weight loss 10/01/2015  . OSA (obstructive sleep apnea)   . Baker's cyst of knee   . Joint effusion, knee   . Effusion of right knee 09/21/2014  . Epididymitis 09/21/2014  . Prostate cancer ([place]) 12/18/2013  . Weakness generalized 05/30/2013  . Prolonged QT interval 05/30/2013  .  Other and unspecified hyperlipidemia 05/30/2013  . Essential hypertension, benign 05/30/2013  . BPH (benign prostatic hypertrophy) 05/30/2013  . Acute gout 05/30/2013  . CKD (chronic kidney disease) stage 3, GFR 30-59 ml/min 05/30/2013    Past Surgical History:  Procedure Laterality Date  . AV FISTULA PLACEMENT Left 08/17/2015   Procedure: BRACHIOCEPHALIC ARTERIOVENOUS (AV) FISTULA CREATION  left arm;  Surgeon: Conrad Queens, MD;  Location: [facility];  Service: Vascular;  Laterality: Left;  . 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 CARDIAC CATHETERIZATION  04-07-2002  DR [facility]   NON-CRITICAL CAD/  PRESERVED LV  . CYSTOSCOPY W/ RETROGRADES Bilateral 10/27/2013   Procedure: CYSTOSCOPY WITH RETROGRADE PYELOGRAM;  Surgeon: Molli Hazard, MD;  Location: [facility];  Service: Urology;  Laterality: Bilateral;  . LYMPHADENECTOMY Bilateral 12/18/2013   Procedure: PELVIC LYMPH NODE DISSECTION;  Surgeon: Alexis Frock, MD;  Location: [facility];  Service: Urology;  Laterality: Bilateral;  . PROSTATE BIOPSY N/A 10/27/2013   Procedure: BIOPSY TRANSRECTAL ULTRASONIC PROSTATE (TUBP);  Surgeon: Molli Hazard, MD;  Location[facility];  Service: Urology;  Laterality: N/A;  . PROSTATE SURGERY    . RIGHT  HAND SURGERY  YRS AGO  . ROBOT ASSISTED LAPAROSCOPIC RADICAL PROSTATECTOMY N/A 12/18/2013   Procedure: ROBOTIC ASSISTED LAPAROSCOPIC RADICAL PROSTATECTOMY AND INDOCYANINE GREEN DYE;  Surgeon: Alexis Frock, MD;  Location: [facility];  Service: Urology;  Laterality: N/A;  . TRANSTHORACIC ECHOCARDIOGRAM  07-03-2006   MILD - MODERATE LVH/  EF 55-60%/  GRADE I DIASTOIC DYSFUNCTION/  TRIVIAL  TR /  TRIVIAL PERICARDIAL EFFUSION    Current Outpatient Rx  . Order #: [id number] Class: Normal  . Order #: [id number] Class: Normal  . Order #: [id number] Class: Historical Med  . Order #: [id number] Class:  Historical Med  . Order #: [id number] Class: Historical Med  . Order #: [id number] Class: Historical Med  . Order #: [id number] Class: Print  . Order #: [id number] Class: Print  . Order #: [id number] Class: Print  . Order #: [id number] Class: Print    Allergies Penicillins  Family History  Problem Relation Age of Onset  . Other Mother     shot and killed  . Cirrhosis Father     Social History Social History  Substance Use Topics  . Smoking status: Never Smoker  . Smokeless tobacco: Never Used  . Alcohol use No    Review of Systems  Constitutional: No fever/chills Eyes: No visual changes. ENT: No sore throat. Cardiovascular: Denies chest pain. Respiratory: Denies shortness of breath. Gastrointestinal: No abdominal pain.  No nausea, no vomiting.  No diarrhea.  No constipation. Genitourinary: Negative for dysuria. Musculoskeletal: Negative for back pain. Positive knee pain and swelling. Skin: Negative for rash. Neurological: Negative for headaches, focal weakness or numbness.  10-point ROS otherwise negative.  ____________________________________________   PHYSICAL EXAM:  VITAL SIGNS: ED Triage Vitals  Enc Vitals Group     BP 07/16/16 1059 145/97     Pulse Rate 07/16/16 1059 91     Resp 07/16/16 1059 20     Temp 07/16/16 1059 98.7 F (37.1 C)     Temp Source 07/16/16 1059 Oral     SpO2 07/16/16 1059 99 %     Pain Score 07/16/16 1101 10    Constitutional: Alert and oriented. Well appearing and in no acute distress. Eyes: Conjunctivae are normal.  Head: Atraumatic. Nose: No congestion/rhinnorhea. Mouth/Throat: Mucous membranes are moist.   Neck: No stridor.  Cardiovascular: Normal rate, regular rhythm. Good peripheral circulation. Grossly normal heart sounds.   Respiratory: Normal respiratory effort.  No retractions. Lungs CTAB. Gastrointestinal: Soft and nontender. No distention.  Musculoskeletal: Positive left knee pain and swelling. No erythema. Mild warmth. No  gross deformities of extremities. Neurologic:  Normal speech and language. No gross focal neurologic deficits are appreciated.  Skin:  Skin is warm, dry and intact. No rash noted. Psychiatric: Mood and affect are normal. Speech and behavior are normal.  ____________________________________________   LABS (all labs ordered are listed, but only abnormal results are displayed)  Labs Reviewed  BASIC METABOLIC PANEL - Abnormal; Notable for the following:       Result Value   Chloride 99 (*)    BUN 43 (*)    Creatinine, Ser 11.81 (*)    GFR calc non Af Amer 4 (*)    GFR calc Af Amer 5 (*)    All other components within normal limits  CBC WITH DIFFERENTIAL/PLATELET - Abnormal; Notable for the following:    Hemoglobin 12.7 (*)    All other components within normal limits  SYNOVIAL CELL COUNT + DIFF, W/ CRYSTALS - Abnormal; Notable for the following:    Appearance-Synovial CLOUDY (*)  WBC, Synovial 2,040 (*)    Neutrophil, Synovial 59 (*)    Lymphocytes-Synovial Fld 22 (*)    Monocyte-Macrophage-Synovial Fluid 19 (*)    All other components within normal limits  BODY FLUID CULTURE  GLUCOSE, SYNOVIAL FLUID  CBG MONITORING, ED   ____________________________________________  RADIOLOGY  LLE Doppler US negative for DVT.  ____________________________________________   PROCEDURES  Procedure(s) performed:   .Joint Aspiration/Arthrocentesis Date/Time: 07/16/2016 6:37 PM Performed by: Margette Fast Authorized by: Margette Fast   Consent:    Consent obtained:  Verbal   Consent given by:  Patient   Risks discussed:  Bleeding, infection, pain, nerve damage and incomplete drainage   Alternatives discussed:  No treatment Universal protocol:    Procedure explained and questions answered to patient or proxy's satisfaction: yes     Test results available and properly labeled: yes     Imaging studies available: yes     Required blood products, implants, devices, and special  equipment available: yes     Site/side marked: yes     Patient identity confirmed:  Verbally with patient and hospital-assigned identification number Location:    Location:  Knee   Knee:  L knee Anesthesia (see MAR for exact dosages):    Anesthesia method:  Local infiltration   Local anesthetic:  Lidocaine 2% w/o epi Procedure details:    Preparation: Patient was prepped and draped in usual sterile fashion     Needle gauge:  18 G   Ultrasound guidance: yes     Approach:  Superior   Aspirate amount:  15 ml   Aspirate characteristics:  Yellow   Steroid injected: no     Specimen collected: yes   Post-procedure details:    Dressing:  Gauze roll   Patient tolerance of procedure:  Tolerated well, no immediate complications   ULTRASOUND LIMITED SOFT TISSUE/ MUSCULOSKELETAL:  Indication: Left knee swelling and arthrocentesis planning Linear probe used to evaluate area of interest in two planes. Findings:  Mild/moderate superior and lateral effusion.  Performed by: Dr Laverta Baltimore Images saved electronically   ____________________________________________   INITIAL IMPRESSION / [place] / ED COURSE  Pertinent labs & imaging results that were available during my care of the patient were reviewed by me and considered in my medical decision making (see chart for details).  Patient with left knee swelling with pain both anterior and posterior to the knee with some calf pain as well. Ultrasound of the left lower extremity shows no evidence of DVT. Patient does have a Baker's cyst in that area which is likely leading to some of the discomfort. Patient also with left knee effusion. No overlying erythema joint is slightly warm. Plan for arthrocentesis to evaluate for possible septic joint but my clinical suspicion for this is lower initially with a history of prior gout. Discussed the risks and benefits with the patient in detail.   12:36 PM Successful aspiration of fluid from the left  knee. I withdrew 15 ML's of yellow, non-cloudy, nonbloody fluid. Patient tolerated well. Sending for Gram stain, cell count, culture.  No evidence of septic joint. No organisms on gram stain. Negative crystals. Suspect inflammatory process after trauma. Sample sent for cx. Wrapped the knee and provided pain medication and crutches. Provided information for orthopedic f/u. Discussed return precautions for septic joint in detail with the patient.   At this time, I do not feel there is any life-threatening condition present. I have reviewed and discussed all results (EKG, imaging, lab, urine  as appropriate), exam findings with patient. I have reviewed nursing notes and appropriate previous records.  I feel the patient is safe to be discharged home without further emergent workup. Discussed usual and customary return precautions. Patient and family (if present) verbalize understanding and are comfortable with this plan.  Patient will follow-up with their primary care provider. If they do not have a primary care provider, information for follow-up has been provided to them. All questions have been answered.  ____________________________________________  FINAL CLINICAL IMPRESSION(S) / ED DIAGNOSES  Final diagnoses:  Acute pain of left knee  Synovial cyst of left popliteal space     MEDICATIONS GIVEN DURING THIS VISIT:  Medications  lidocaine (XYLOCAINE) 2 % (with pres) injection 200 mg (not administered)  HYDROcodone-acetaminophen (NORCO/VICODIN) 5-325 MG per tablet 1 tablet (1 tablet Oral Given 07/16/16 1251)     NEW OUTPATIENT MEDICATIONS STARTED DURING THIS VISIT:  Discharge Medication List as of 07/16/2016  3:26 PM    START taking these medications   Details  !! oxyCODONE-acetaminophen (PERCOCET/ROXICET) 5-325 MG tablet Take 1 tablet by mouth every 4 (four) hours as needed for severe pain., Starting Sun 07/16/2016, Print     !! - Potential duplicate medications found. Please discuss with  provider.        Note:  This document was prepared using Dragon voice recognition software and may include unintentional dictation errors.  Nanda Quinton, MD Emergency Medicine   Margette Fast, MD 07/16/16 [phone]

## 2016-07-19 LAB — BODY FLUID CULTURE: CULTURE: NO GROWTH

## 2016-08-31 ENCOUNTER — Emergency Department (HOSPITAL_COMMUNITY): Payer: Commercial Managed Care - HMO

## 2016-08-31 ENCOUNTER — Emergency Department (HOSPITAL_COMMUNITY)
Admission: EM | Admit: 2016-08-31 | Discharge: 2016-08-31 | Disposition: A | Discharge status: Home / Self Care | Payer: Commercial Managed Care - HMO | Attending: Emergency Medicine | Admitting: Emergency Medicine

## 2016-08-31 ENCOUNTER — Encounter (HOSPITAL_COMMUNITY): Payer: Self-pay | Admitting: Emergency Medicine

## 2016-08-31 DIAGNOSIS — E1122 Type 2 diabetes mellitus with diabetic chronic kidney disease: Secondary | ICD-10-CM | POA: Diagnosis not present

## 2016-08-31 DIAGNOSIS — N186 End stage renal disease: Secondary | ICD-10-CM | POA: Diagnosis not present

## 2016-08-31 DIAGNOSIS — M25562 Pain in left knee: Secondary | ICD-10-CM | POA: Insufficient documentation

## 2016-08-31 DIAGNOSIS — Z8546 Personal history of malignant neoplasm of prostate: Secondary | ICD-10-CM | POA: Insufficient documentation

## 2016-08-31 DIAGNOSIS — I120 Hypertensive chronic kidney disease with stage 5 chronic kidney disease or end stage renal disease: Secondary | ICD-10-CM | POA: Diagnosis not present

## 2016-08-31 MED ORDER — OXYCODONE-ACETAMINOPHEN 5-325 MG PO TABS: 2.0000 | ORAL_TABLET | ORAL | 0 refills | Status: DC | PRN

## 2016-08-31 MED ORDER — OXYCODONE-ACETAMINOPHEN 5-325 MG PO TABS
1.0000 | ORAL_TABLET | Freq: Once | ORAL | Status: AC
  Administered 2016-08-31: 1 via ORAL
  Filled 2016-08-31: qty 1

## 2016-08-31 MED ORDER — OXYCODONE-ACETAMINOPHEN 5-325 MG PO TABS
1.0000 | ORAL_TABLET | Freq: Once | ORAL | Status: AC
  Administered 2016-08-31: 1 via ORAL

## 2016-08-31 MED ORDER — OXYCODONE-ACETAMINOPHEN 5-325 MG PO TABS
ORAL_TABLET | ORAL | Status: AC
  Filled 2016-08-31: qty 1

## 2016-08-31 MED ORDER — PREDNISONE 20 MG PO TABS
60.0000 mg | ORAL_TABLET | Freq: Once | ORAL | Status: AC
  Administered 2016-08-31: 60 mg via ORAL
  Filled 2016-08-31: qty 3

## 2016-08-31 MED ORDER — PREDNISONE 20 MG PO TABS: 40.0000 mg | ORAL_TABLET | Freq: Every day | ORAL | 0 refills | Status: DC

## 2016-08-31 NOTE — ED Notes (Signed)
See EDP assessment 

## 2016-08-31 NOTE — ED Notes (Signed)
Patient transported to X-ray 

## 2016-08-31 NOTE — ED Triage Notes (Signed)
Pt reports L leg pain and knee pain for two days, states similar to hx of gout. Pt reports hasn't had anything at home to manage pain. Pt reports that sharp pains radiating from bottom of foot up leg. Difficulty with position changes and ambulating.

## 2016-08-31 NOTE — ED Provider Notes (Signed)
[facility] DEPT Provider Note   CSN: [id number] Arrival date & time: 08/31/16  0018     History   Chief Complaint Chief Complaint  Patient presents with  . Knee Pain  . Leg Pain    HPI [name] is a 56 y.o. male.  HPI   Patient is a 55 year old male with history of end-stage renal disease on hemodialysis, hypertension, hyperlipidemia, OSA and gout who presents to the ED with complaint of left knee pain, onset 3 days. Patient reports having constant gradually worsening pain and swelling to his left knee over the past 3 days. He reports symptoms are consistent with gout flares he has had in the past. Patient reports pain is worse with movement or when ambulating. Denies taking any medications for his symptoms. Denies any recent fall, trauma or injury. Denies fever, redness, warmth, numbness, weakness. Patient states he has been treated with prednisone in the past for similar gout flares. Denies any calf swelling or history of DVT/PE. Patient reports he has been going to his dialysis treatment as scheduled (Monday/Wednesday/Friday) and reports having most recent tx earlier today.  Past Medical History:  Diagnosis Date  . Anemia 10/01/2015  . Arthritis    SPINE  . Borderline diabetes mellitus   . BPH (benign prostatic hypertrophy)   . Cancer ([place])   . CKD (chronic kidney disease), stage III   . Diabetes mellitus without complication (HCC)    borderline on no meds   . Dialysis patient ([place])   . Elevated PSA   . Gout    STABLE  PER PT 10-22-2013  . H/O hiatal hernia   . Hyperlipidemia   . Hypertension   . Microhematuria   . Nocturia   . OSA (obstructive sleep apnea)    PER PT STUDY DONE 2005 (APPROX).  NON- COMPLIANT CPAP  . Wears glasses     Patient Active Problem List   Diagnosis Date Noted  . Symptomatic anemia   . CKD (chronic kidney disease)   . Hemoptysis 10/01/2015  . Anemia 10/01/2015  . Unintentional weight loss 10/01/2015  . OSA (obstructive sleep  apnea)   . Baker's cyst of knee   . Joint effusion, knee   . Effusion of right knee 09/21/2014  . Epididymitis 09/21/2014  . Prostate cancer ([place]) 12/18/2013  . Weakness generalized 05/30/2013  . Prolonged QT interval 05/30/2013  . Other and unspecified hyperlipidemia 05/30/2013  . Essential hypertension, benign 05/30/2013  . BPH (benign prostatic hypertrophy) 05/30/2013  . Acute gout 05/30/2013  . CKD (chronic kidney disease) stage 3, GFR 30-59 ml/min 05/30/2013    Past Surgical History:  Procedure Laterality Date  . AV FISTULA PLACEMENT Left 08/17/2015   Procedure: BRACHIOCEPHALIC ARTERIOVENOUS (AV) FISTULA CREATION  left arm;  Surgeon: Conrad Berryville, MD;  Location: [facility];  Service: Vascular;  Laterality: Left;  . 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 CARDIAC CATHETERIZATION  04-07-2002  DR [facility]   NON-CRITICAL CAD/  PRESERVED LV  . CYSTOSCOPY W/ RETROGRADES Bilateral 10/27/2013   Procedure: CYSTOSCOPY WITH RETROGRADE PYELOGRAM;  Surgeon: Molli Hazard, MD;  Location[facility];  Service: Urology;  Laterality: Bilateral;  . LYMPHADENECTOMY Bilateral 12/18/2013   Procedure: PELVIC LYMPH NODE DISSECTION;  Surgeon: Alexis Frock, MD;  [facility];  Service: Urology;  Laterality: Bilateral;  . PROSTATE BIOPSY N/A 10/27/2013   Procedure: BIOPSY TRANSRECTAL ULTRASONIC PROSTATE (  TUBP);  Surgeon: Molli Hazard, MD;  Location: [facility];  Service: Urology;  Laterality: N/A;  . PROSTATE SURGERY    . RIGHT HAND SURGERY  YRS AGO  . ROBOT ASSISTED LAPAROSCOPIC RADICAL PROSTATECTOMY N/A 12/18/2013   Procedure: ROBOTIC ASSISTED LAPAROSCOPIC RADICAL PROSTATECTOMY AND INDOCYANINE GREEN DYE;  Surgeon: Alexis Frock, MD;  [facility];  Service: Urology;  Laterality: N/A;  . TRANSTHORACIC ECHOCARDIOGRAM  07-03-2006   MILD - MODERATE LVH/  EF 55-60%/  GRADE I  DIASTOIC DYSFUNCTION/  TRIVIAL  TR /  TRIVIAL PERICARDIAL EFFUSION       Home Medications    Prior to Admission medications   Medication Sig Start Date End Date Taking? Authorizing Provider  calcitRIOL (ROCALTROL) 0.5 MCG capsule Take 1 capsule (0.5 mcg total) by mouth daily. 10/09/15   Reyne Dumas, MD  calcium acetate (PHOSLO) 667 MG capsule Take 2 capsules (1,334 mg total) by mouth 3 (three) times daily with meals. 10/09/15   Reyne Dumas, MD  multivitamin (RENA-VIT) TABS tablet Take 1 tablet by mouth daily.    Historical Provider, MD  oxyCODONE-acetaminophen (PERCOCET/ROXICET) 5-325 MG tablet Take 2 tablets by mouth every 4 (four) hours as needed for severe pain. 08/31/16   Nona Dell, PA-C  predniSONE (DELTASONE) 20 MG tablet Take 2 tablets (40 mg total) by mouth daily. 08/31/16   Chesley Noon Merit Maybee, PA-C  SENSIPAR 30 MG tablet Take 1 capsule by mouth daily. 12/08/15   Historical Provider, MD  traZODone (DESYREL) 50 MG tablet  06/26/16   Historical Provider, MD  Vitamin D, Ergocalciferol, (DRISDOL) 50000 units CAPS capsule Take 50,000 Units by mouth 3 (three) times a week.    Historical Provider, MD    Family History Family History  Problem Relation Age of Onset  . Other Mother     shot and killed  . Cirrhosis Father     Social History Social History  Substance Use Topics  . Smoking status: Never Smoker  . Smokeless tobacco: Never Used  . Alcohol use No     Allergies   Penicillins   Review of Systems Review of Systems  Constitutional: Negative for fever.  Musculoskeletal: Positive for arthralgias (left knee) and joint swelling.  Skin: Negative for wound.  Neurological: Negative for weakness and numbness.     Physical Exam Updated Vital Signs BP (!) 146/111 (BP Location: Right Arm)   Pulse 107   Temp 98.2 F (36.8 C) (Oral)   Resp 20   Ht 5\' 9"  (1.753 m)   Wt 99.3 kg   SpO2 100%   BMI 32.34 kg/m   Physical Exam  Constitutional: He is  oriented to person, place, and time. He appears well-developed and well-nourished. No distress.  HENT:  Head: Normocephalic and atraumatic.  Eyes: Conjunctivae and EOM are normal. Right eye exhibits no discharge. Left eye exhibits no discharge. No scleral icterus.  Neck: Normal range of motion. Neck supple.  Cardiovascular: Normal rate and intact distal pulses.   Pulmonary/Chest: Effort normal.  Musculoskeletal: He exhibits tenderness. He exhibits no deformity.       Left knee: He exhibits decreased range of motion (due to pain/swelling), swelling and effusion. He exhibits no ecchymosis, no deformity, no laceration, no erythema, normal alignment, no LCL laxity, normal patellar mobility and no MCL laxity. Tenderness (diffuse) found.  Mild swelling, effusion and tenderness present to left knee. Patient only able to actively flex left knee 45 due to reported pain. No erythema or warmth present. Sensation  grossly intact. Full range of motion of left hip, ankle and foot with 5 out of 5 strength. 2+ PT pulse. No abrasion, ecchymosis or laceration present. No deformity noted. No calf swelling or tenderness.  Neurological: He is alert and oriented to person, place, and time.  Skin: Skin is warm and dry. Capillary refill takes less than 2 seconds. He is not diaphoretic.  Nursing note and vitals reviewed.    ED Treatments / Results  Labs (all labs ordered are listed, but only abnormal results are displayed) Labs Reviewed - No data to display  EKG  EKG Interpretation None       Radiology Dg Knee Complete 4 Views Left  Result Date: 08/31/2016 CLINICAL DATA:  56 year old male with left knee pain and swelling. EXAM: LEFT KNEE - COMPLETE 4+ VIEW COMPARISON:  None. FINDINGS: There is no acute fracture or dislocation. The bones are well mineralized. No arthritic changes. Trace suprapatellar effusion may be present. The soft tissues are unremarkable. IMPRESSION: Negative. Electronically Signed   By:  Anner Crete M.D.   On: 08/31/2016 02:28    Procedures Procedures (including critical care time)  Medications Ordered in ED Medications  oxyCODONE-acetaminophen (PERCOCET/ROXICET) 5-325 MG per tablet 1 tablet (not administered)  predniSONE (DELTASONE) tablet 60 mg (not administered)  oxyCODONE-acetaminophen (PERCOCET/ROXICET) 5-325 MG per tablet 1 tablet (1 tablet Oral Given 08/31/16 0034)     Initial Impression / Assessment and Plan / ED Course  I have reviewed the triage vital signs and the nursing notes.  Pertinent labs & imaging results that were available during my care of the patient were reviewed by me and considered in my medical decision making (see chart for details).    Patient presents with left knee pain and swelling consistent with gout flares he has had in the past. Pain is worse with flexion or ambulation. Denies any recent fall, trauma or injury. VSS. Exam revealed mild tenderness and swelling to left knee diffusely. No signs of septic joint on exam. No calf tenderness or swelling concerning for DVT. Patient with history of end-stage renal disease on hemodialysis. Chart review shows patient has been seen in the ED multiple times before for similar gout flares/OA and discharged home with prednisone. Left knee xray negative. Plan to discharge patient home with Percocet and steroids. Advised patient to follow up with PCP within the next 3-4 days. Discussed strict return precautions.  Final Clinical Impressions(s) / ED Diagnoses   Final diagnoses:  Acute pain of left knee    New Prescriptions New Prescriptions   OXYCODONE-ACETAMINOPHEN (PERCOCET/ROXICET) 5-325 MG TABLET    Take 2 tablets by mouth every 4 (four) hours as needed for severe pain.   PREDNISONE (DELTASONE) 20 MG TABLET    Take 2 tablets (40 mg total) by mouth daily.     Chesley Noon [place], [place] 08/31/16 0962    Ripley Fraise, MD 09/01/16 1002

## 2016-08-31 NOTE — Discharge Instructions (Signed)
Take your medications as prescribed. I also recommend resting in a plane ice for 15-20 minutes 3-4 times daily to help with pain and swelling. Follow-up with your primary care provider in the next 4-5 days for follow-up evaluation and further management of your gout. Return to the emergency department if symptoms worsen or new onset of fever, worsening redness, warmth, numbness, decreased range of motion, weakness, calf swelling/tenderness.

## 2016-09-01 ENCOUNTER — Emergency Department (HOSPITAL_COMMUNITY)
Admission: EM | Admit: 2016-09-01 | Discharge: 2016-09-01 | Disposition: A | Discharge status: Home / Self Care | Payer: Commercial Managed Care - HMO | Attending: Emergency Medicine | Admitting: Emergency Medicine

## 2016-09-01 ENCOUNTER — Emergency Department (HOSPITAL_COMMUNITY): Payer: Commercial Managed Care - HMO

## 2016-09-01 ENCOUNTER — Encounter (HOSPITAL_COMMUNITY): Payer: Self-pay

## 2016-09-01 DIAGNOSIS — N183 Chronic kidney disease, stage 3 (moderate): Secondary | ICD-10-CM | POA: Diagnosis not present

## 2016-09-01 DIAGNOSIS — I129 Hypertensive chronic kidney disease with stage 1 through stage 4 chronic kidney disease, or unspecified chronic kidney disease: Secondary | ICD-10-CM | POA: Diagnosis not present

## 2016-09-01 DIAGNOSIS — Z8546 Personal history of malignant neoplasm of prostate: Secondary | ICD-10-CM | POA: Insufficient documentation

## 2016-09-01 DIAGNOSIS — R079 Chest pain, unspecified: Secondary | ICD-10-CM | POA: Diagnosis present

## 2016-09-01 DIAGNOSIS — E1122 Type 2 diabetes mellitus with diabetic chronic kidney disease: Secondary | ICD-10-CM | POA: Insufficient documentation

## 2016-09-01 DIAGNOSIS — I471 Supraventricular tachycardia: Secondary | ICD-10-CM | POA: Insufficient documentation

## 2016-09-01 DIAGNOSIS — Z79899 Other long term (current) drug therapy: Secondary | ICD-10-CM | POA: Insufficient documentation

## 2016-09-01 LAB — CBC
HCT: 39.1 % (ref 39.0–52.0)
HEMOGLOBIN: 12.9 g/dL — AB (ref 13.0–17.0)
MCH: 30.4 pg (ref 26.0–34.0)
MCHC: 33 g/dL (ref 30.0–36.0)
MCV: 92.2 fL (ref 78.0–100.0)
Platelets: 191 10*3/uL (ref 150–400)
RBC: 4.24 MIL/uL (ref 4.22–5.81)
RDW: 14.4 % (ref 11.5–15.5)
WBC: 13.4 10*3/uL — AB (ref 4.0–10.5)

## 2016-09-01 LAB — I-STAT TROPONIN, ED: Troponin i, poc: 0 ng/mL (ref 0.00–0.08)

## 2016-09-01 LAB — BASIC METABOLIC PANEL
Anion gap: 11 (ref 5–15)
BUN: 21 mg/dL — AB (ref 6–20)
CHLORIDE: 99 mmol/L — AB (ref 101–111)
CO2: 31 mmol/L (ref 22–32)
Calcium: 8.9 mg/dL (ref 8.9–10.3)
Creatinine, Ser: 6.57 mg/dL — ABNORMAL HIGH (ref 0.61–1.24)
GFR calc Af Amer: 10 mL/min — ABNORMAL LOW (ref >60)
GFR calc non Af Amer: 8 mL/min — ABNORMAL LOW (ref >60)
Glucose, Bld: 143 mg/dL — ABNORMAL HIGH (ref 65–99)
POTASSIUM: 3.3 mmol/L — AB (ref 3.5–5.1)
SODIUM: 141 mmol/L (ref 135–145)

## 2016-09-01 MED ORDER — SODIUM CHLORIDE 0.9 % IV BOLUS (SEPSIS)
500.0000 mL | Freq: Once | INTRAVENOUS | Status: AC
  Administered 2016-09-01: 500 mL via INTRAVENOUS

## 2016-09-01 MED ORDER — ADENOSINE 6 MG/2ML IV SOLN
INTRAVENOUS | Status: AC
  Filled 2016-09-01: qty 6

## 2016-09-01 MED ORDER — ADENOSINE 6 MG/2ML IV SOLN
6.0000 mg | Freq: Once | INTRAVENOUS | Status: AC
  Administered 2016-09-01: 6 mg via INTRAVENOUS

## 2016-09-01 MED ORDER — ADENOSINE 6 MG/2ML IV SOLN
12.0000 mg | Freq: Once | INTRAVENOUS | Status: AC
  Administered 2016-09-01: 12 mg via INTRAVENOUS

## 2016-09-01 NOTE — ED Triage Notes (Addendum)
Pt presents for central CP starting today while at dialysis. Pt reports he received approx 2 hours of his treatment. Pt reports slight SOB. HR is 177 in traige. Pt AxO x4. Pt denies pain in triage.

## 2016-09-01 NOTE — Discharge Instructions (Signed)
You have been evaluated for fast heart rate.  Please follow up closely with your doctor and with cardiologist for further evaluation. Return to the ER if your symptoms return.

## 2016-09-01 NOTE — ED Provider Notes (Signed)
[facility] DEPT Provider Note   CSN: [id number] Arrival date & time: 09/01/16  4496     History   Chief Complaint Chief Complaint  Patient presents with  . Chest Pain    HPI [name] is a 56 y.o. male.  HPI   56 year old male with history of prostate cancer, borderline diabetes, chronic kidney disease currently a dialysis patient, history of gout, obstructive sleep apnea presenting today with complaint of elevated heart rate.  Patient is a Monday Wednesday Friday dialysis patient. Today he was receiving his dialysis and 2 hours into his heart rate begins to race. He did notice some fluttering sensation in his chest but denies any associated chest pain or shortness of breath. No lightheadedness of dizziness no abdominal pain or back pain. He has never had this problem before. He denies any recent change in activities. He did report having knee pain was seen in the ED yesterday and subsequently discharge with pain medication Percocet as well as prednisone. He has taken these in the past. No history of atrial fibrillation. No prior history of PE or DVT, no recent surgery, prolonged bed rest.  Past Medical History:  Diagnosis Date  . Anemia 10/01/2015  . Arthritis    SPINE  . Borderline diabetes mellitus   . BPH (benign prostatic hypertrophy)   . Cancer ([place])   . CKD (chronic kidney disease), stage III   . Diabetes mellitus without complication (HCC)    borderline on no meds   . Dialysis patient ([place])   . Elevated PSA   . Gout    STABLE  PER PT 10-22-2013  . H/O hiatal hernia   . Hyperlipidemia   . Hypertension   . Microhematuria   . Nocturia   . OSA (obstructive sleep apnea)    PER PT STUDY DONE 2005 (APPROX).  NON- COMPLIANT CPAP  . Wears glasses     Patient Active Problem List   Diagnosis Date Noted  . Symptomatic anemia   . CKD (chronic kidney disease)   . Hemoptysis 10/01/2015  . Anemia 10/01/2015  . Unintentional weight loss 10/01/2015  . OSA  (obstructive sleep apnea)   . Baker's cyst of knee   . Joint effusion, knee   . Effusion of right knee 09/21/2014  . Epididymitis 09/21/2014  . Prostate cancer ([place]) 12/18/2013  . Weakness generalized 05/30/2013  . Prolonged QT interval 05/30/2013  . Other and unspecified hyperlipidemia 05/30/2013  . Essential hypertension, benign 05/30/2013  . BPH (benign prostatic hypertrophy) 05/30/2013  . Acute gout 05/30/2013  . CKD (chronic kidney disease) stage 3, GFR 30-59 ml/min 05/30/2013    Past Surgical History:  Procedure Laterality Date  . AV FISTULA PLACEMENT Left 08/17/2015   Procedure: BRACHIOCEPHALIC ARTERIOVENOUS (AV) FISTULA CREATION  left arm;  Surgeon: Conrad Raytown, MD;  Location: [facility];  Service: Vascular;  Laterality: Left;  . 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 CARDIAC CATHETERIZATION  04-07-2002  DR [facility]   NON-CRITICAL CAD/  PRESERVED LV  . CYSTOSCOPY W/ RETROGRADES Bilateral 10/27/2013   Procedure: CYSTOSCOPY WITH RETROGRADE PYELOGRAM;  Surgeon: Molli Hazard, MD;  Location[facility];  Service: Urology;  Laterality: Bilateral;  . LYMPHADENECTOMY Bilateral 12/18/2013   Procedure: PELVIC LYMPH NODE DISSECTION;  Surgeon: Alexis Frock, MD;  Location: [facility];  Service: Urology;  Laterality: Bilateral;  . PROSTATE BIOPSY N/A 10/27/2013   Procedure:  BIOPSY TRANSRECTAL ULTRASONIC PROSTATE (TUBP);  Surgeon: Molli Hazard, MD;  Location[facility];  Service: Urology;  Laterality: N/A;  . PROSTATE SURGERY    . RIGHT HAND SURGERY  YRS AGO  . ROBOT ASSISTED LAPAROSCOPIC RADICAL PROSTATECTOMY N/A 12/18/2013   Procedure: ROBOTIC ASSISTED LAPAROSCOPIC RADICAL PROSTATECTOMY AND INDOCYANINE GREEN DYE;  Surgeon: Alexis Frock, MD;  Location: [facility];  Service: Urology;  Laterality: N/A;  . TRANSTHORACIC ECHOCARDIOGRAM  07-03-2006   MILD - MODERATE LVH/  EF  55-60%/  GRADE I DIASTOIC DYSFUNCTION/  TRIVIAL  TR /  TRIVIAL PERICARDIAL EFFUSION       Home Medications    Prior to Admission medications   Medication Sig Start Date End Date Taking? Authorizing Provider  calcitRIOL (ROCALTROL) 0.5 MCG capsule Take 1 capsule (0.5 mcg total) by mouth daily. 10/09/15   Reyne Dumas, MD  calcium acetate (PHOSLO) 667 MG capsule Take 2 capsules (1,334 mg total) by mouth 3 (three) times daily with meals. 10/09/15   Reyne Dumas, MD  multivitamin (RENA-VIT) TABS tablet Take 1 tablet by mouth daily.    Historical Provider, MD  oxyCODONE-acetaminophen (PERCOCET/ROXICET) 5-325 MG tablet Take 2 tablets by mouth every 4 (four) hours as needed for severe pain. 08/31/16   Nona Dell, PA-C  predniSONE (DELTASONE) 20 MG tablet Take 2 tablets (40 mg total) by mouth daily. 08/31/16   Chesley Noon Nadeau, PA-C  SENSIPAR 30 MG tablet Take 1 capsule by mouth daily. 12/08/15   Historical Provider, MD  traZODone (DESYREL) 50 MG tablet  06/26/16   Historical Provider, MD  Vitamin D, Ergocalciferol, (DRISDOL) 50000 units CAPS capsule Take 50,000 Units by mouth 3 (three) times a week.    Historical Provider, MD    Family History Family History  Problem Relation Age of Onset  . Other Mother     shot and killed  . Cirrhosis Father     Social History Social History  Substance Use Topics  . Smoking status: Never Smoker  . Smokeless tobacco: Never Used  . Alcohol use No     Allergies   Penicillins   Review of Systems Review of Systems  All other systems reviewed and are negative.    Physical Exam Updated Vital Signs BP (!) 122/95 (BP Location: Right Arm)   Pulse (!) 174   Temp 97.6 F (36.4 C) (Oral)   Resp 18   SpO2 98%   Physical Exam  Constitutional: He appears well-developed and well-nourished. No distress.  HENT:  Head: Atraumatic.  Eyes: Conjunctivae are normal.  Neck: Neck supple.  Cardiovascular:  Tachycardia without murmurs rubs  gallops  Pulmonary/Chest: Effort normal and breath sounds normal.  Abdominal: Soft. There is no tenderness.  Musculoskeletal: He exhibits no edema.  Neurological: He is alert. No cranial nerve deficit or sensory deficit. GCS eye subscore is 4. GCS verbal subscore is 5. GCS motor subscore is 6.  Functional left AV fistula.  Skin: No rash noted.  Psychiatric: He has a normal mood and affect.  Nursing note and vitals reviewed.    ED Treatments / Results  Labs (all labs ordered are listed, but only abnormal results are displayed) Labs Reviewed  BASIC METABOLIC PANEL - Abnormal; Notable for the following:       Result Value   Potassium 3.3 (*)    Chloride 99 (*)    Glucose, Bld 143 (*)    BUN 21 (*)    Creatinine, Ser 6.57 (*)    GFR calc  non Af Amer 8 (*)    GFR calc Af Amer 10 (*)    All other components within normal limits  CBC - Abnormal; Notable for the following:    WBC 13.4 (*)    Hemoglobin 12.9 (*)    All other components within normal limits  I-STAT TROPOININ, ED    EKG  EKG Interpretation  Date/Time:  Friday September 01 2016 10:17:59 EDT Ventricular Rate:  152 PR Interval:    QRS Duration: 91 QT Interval:  360 QTC Calculation: 573 R Axis:   11 Text Interpretation:  Supraventricular tachycardia Abnormal R-wave progression, early transition Left ventricular hypertrophy Abnormal T, consider ischemia, diffuse leads Prolonged QT interval Baseline wander in lead(s) V3 Confirmed by BELFI  MD, MELANIE ([id number]) on 09/01/2016 10:27:55 AM       Radiology Dg Knee Complete 4 Views Left  Result Date: 08/31/2016 CLINICAL DATA:  56 year old male with left knee pain and swelling. EXAM: LEFT KNEE - COMPLETE 4+ VIEW COMPARISON:  None. FINDINGS: There is no acute fracture or dislocation. The bones are well mineralized. No arthritic changes. Trace suprapatellar effusion may be present. The soft tissues are unremarkable. IMPRESSION: Negative. Electronically Signed   By: Anner Crete M.D.   On: 08/31/2016 02:28    Procedures .Cardioversion Date/Time: 09/01/2016 10:56 AM Performed by: Domenic Moras Authorized by: Domenic Moras   Consent:    Consent obtained:  Verbal   Consent given by:  Patient   Risks discussed:  Induced arrhythmia, death and pain   Alternatives discussed:  No treatment, alternative treatment, delayed treatment and observation Pre-procedure details:    Cardioversion basis:  Emergent   Rhythm:  Supraventricular tachycardia   Electrode placement:  Anterior-posterior Attempt one:    Cardioversion mode attempt one: Adenosine 6mg .   Shock outcome:  No change in rhythm Attempt two:    Cardioversion mode attempt two: adenosine 12mg .   Shock outcome:  Conversion to normal sinus rhythm Post-procedure details:    Patient status:  Alert   Patient tolerance of procedure:  Tolerated well, no immediate complications   (including critical care time)    Medications Ordered in ED Medications  adenosine (ADENOCARD) 6 MG/2ML injection 6 mg (6 mg Intravenous Given 09/01/16 1051)  sodium chloride 0.9 % bolus 500 mL (0 mLs Intravenous Stopped 09/01/16 1105)  adenosine (ADENOCARD) 6 MG/2ML injection 12 mg (12 mg Intravenous Given 09/01/16 1056)     Initial Impression / Assessment and Plan / ED Course  I have reviewed the triage vital signs and the nursing notes.  Pertinent labs & imaging results that were available during my care of the patient were reviewed by me and considered in my medical decision making (see chart for details).     BP (!) 126/97   Pulse 83   Temp 97.6 F (36.4 C) (Oral)   Resp (!) 23   Ht 5\' 9"  (1.753 m)   Wt 99.3 kg   SpO2 98%   BMI 32.34 kg/m    Final Clinical Impressions(s) / ED Diagnoses   Final diagnoses:  Paroxysmal SVT (supraventricular tachycardia) (HCC)    New Prescriptions New Prescriptions   No medications on file   10:09 AM Patient without any prior history of atrial fibrillation or SVT who  presents with complaints of tachycardia, heart rate in the 160/170. This started prior to arrival while he was at his dialysis center receiving dialysis. Aside from heart palpitation he denies any active chest pain lightheadedness of dizziness. He is well-appearing.  Patient was placed in the reverse Trendelenburg position, Valsalva maneuver was performed for approximately 12 minutes without any success. Heart rate maintained in the 160-170.  Care discussed with Dr. Tamera Punt  10:59 AM Successful conversion of SVT using adenosine (6mg , then 12mg ).  Pt tolerates well.  Will continue to monitor.    11:50 AM Pt remains in sinus rhythm, resting comfortably  Stable for discharge.  He will f/u closely with cardiology for further care.  Return precaution discussed.    Domenic Moras, PA-C 09/01/16 [place], MD 09/01/16 [phone]

## 2016-09-08 ENCOUNTER — Emergency Department (HOSPITAL_COMMUNITY)
Admission: EM | Admit: 2016-09-08 | Discharge: 2016-09-08 | Disposition: A | Discharge status: Home / Self Care | Payer: Commercial Managed Care - HMO | Attending: Emergency Medicine | Admitting: Emergency Medicine

## 2016-09-08 ENCOUNTER — Encounter (HOSPITAL_COMMUNITY): Payer: Self-pay | Admitting: *Deleted

## 2016-09-08 DIAGNOSIS — I129 Hypertensive chronic kidney disease with stage 1 through stage 4 chronic kidney disease, or unspecified chronic kidney disease: Secondary | ICD-10-CM | POA: Insufficient documentation

## 2016-09-08 DIAGNOSIS — I471 Supraventricular tachycardia: Secondary | ICD-10-CM

## 2016-09-08 DIAGNOSIS — N183 Chronic kidney disease, stage 3 (moderate): Secondary | ICD-10-CM | POA: Diagnosis not present

## 2016-09-08 DIAGNOSIS — R000 Tachycardia, unspecified: Secondary | ICD-10-CM | POA: Diagnosis present

## 2016-09-08 DIAGNOSIS — Z79899 Other long term (current) drug therapy: Secondary | ICD-10-CM | POA: Insufficient documentation

## 2016-09-08 DIAGNOSIS — Z8546 Personal history of malignant neoplasm of prostate: Secondary | ICD-10-CM | POA: Diagnosis not present

## 2016-09-08 HISTORY — DX: Supraventricular tachycardia, unspecified: I47.10

## 2016-09-08 HISTORY — DX: Supraventricular tachycardia: I47.1

## 2016-09-08 NOTE — Discharge Instructions (Signed)
Take your medication as prescribed.  Follow up with cards.  Return for worsening symptoms or recurrance.

## 2016-09-08 NOTE — ED Provider Notes (Signed)
[facility] DEPT Provider Note   CSN: [id number] Arrival date & time: 09/08/16  1140     History   Chief Complaint Chief Complaint  Patient presents with  . Tachycardia    HPI [name] is a 56 y.o. male.  56 yo M with a chief complaint of palpitations. The started while he was at dialysis. Patient has a history of SVT and felt like this was the same. Found to be an SVT in triage and was taken back to the room. Prior to arrival he converted spontaneously. Patient denies any other complaints. Denies chest pain during the event denies lower extremity edema. He did finish his entire session of dialysis today. He has a prescription for metoprolol that he has not yet filled. Has an appointment upcoming with cardiology.   The history is provided by the patient.  Illness  This is a recurrent problem. The current episode started less than 1 hour ago. The problem occurs constantly. The problem has been resolved. Pertinent negatives include no chest pain, no abdominal pain, no headaches and no shortness of breath. Nothing aggravates the symptoms. Nothing relieves the symptoms. He has tried nothing for the symptoms. The treatment provided no relief.    Past Medical History:  Diagnosis Date  . Anemia 10/01/2015  . Arthritis    SPINE  . Borderline diabetes mellitus   . BPH (benign prostatic hypertrophy)   . Cancer ([place])   . CKD (chronic kidney disease), stage III   . Diabetes mellitus without complication (HCC)    borderline on no meds   . Dialysis patient ([place])   . Elevated PSA   . Gout    STABLE  PER PT 10-22-2013  . H/O hiatal hernia   . Hyperlipidemia   . Hypertension   . Microhematuria   . Nocturia   . OSA (obstructive sleep apnea)    PER PT STUDY DONE 2005 (APPROX).  NON- COMPLIANT CPAP  . SVT (supraventricular tachycardia) ([place])   . Wears glasses     Patient Active Problem List   Diagnosis Date Noted  . Symptomatic anemia   . CKD (chronic kidney disease)   .  Hemoptysis 10/01/2015  . Anemia 10/01/2015  . Unintentional weight loss 10/01/2015  . OSA (obstructive sleep apnea)   . Baker's cyst of knee   . Joint effusion, knee   . Effusion of right knee 09/21/2014  . Epididymitis 09/21/2014  . Prostate cancer ([place]) 12/18/2013  . Weakness generalized 05/30/2013  . Prolonged QT interval 05/30/2013  . Other and unspecified hyperlipidemia 05/30/2013  . Essential hypertension, benign 05/30/2013  . BPH (benign prostatic hypertrophy) 05/30/2013  . Acute gout 05/30/2013  . CKD (chronic kidney disease) stage 3, GFR 30-59 ml/min 05/30/2013    Past Surgical History:  Procedure Laterality Date  . AV FISTULA PLACEMENT Left 08/17/2015   Procedure: BRACHIOCEPHALIC ARTERIOVENOUS (AV) FISTULA CREATION  left arm;  Surgeon: Conrad Center Line, MD;  Location: [facility];  Service: Vascular;  Laterality: Left;  . 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 CARDIAC CATHETERIZATION  04-07-2002  DR [facility]   NON-CRITICAL CAD/  PRESERVED LV  . CYSTOSCOPY W/ RETROGRADES Bilateral 10/27/2013   Procedure: CYSTOSCOPY WITH RETROGRADE PYELOGRAM;  Surgeon: Molli Hazard, MD;  [facility];  Service: Urology;  Laterality: Bilateral;  . LYMPHADENECTOMY Bilateral 12/18/2013   Procedure: PELVIC LYMPH NODE DISSECTION;  Surgeon: Hubbard Robinson  Tresa Moore, MD;  Location[facility];  Service: Urology;  Laterality: Bilateral;  . PROSTATE BIOPSY N/A 10/27/2013   Procedure: BIOPSY TRANSRECTAL ULTRASONIC PROSTATE (TUBP);  Surgeon: Molli Hazard, MD;  Location: [facility];  Service: Urology;  Laterality: N/A;  . PROSTATE SURGERY    . RIGHT HAND SURGERY  YRS AGO  . ROBOT ASSISTED LAPAROSCOPIC RADICAL PROSTATECTOMY N/A 12/18/2013   Procedure: ROBOTIC ASSISTED LAPAROSCOPIC RADICAL PROSTATECTOMY AND INDOCYANINE GREEN DYE;  Surgeon: Alexis Frock, MD;  [facility];  Service: Urology;   Laterality: N/A;  . TRANSTHORACIC ECHOCARDIOGRAM  07-03-2006   MILD - MODERATE LVH/  EF 55-60%/  GRADE I DIASTOIC DYSFUNCTION/  TRIVIAL  TR /  TRIVIAL PERICARDIAL EFFUSION       Home Medications    Prior to Admission medications   Medication Sig Start Date End Date Taking? Authorizing Provider  calcitRIOL (ROCALTROL) 0.5 MCG capsule Take 1 capsule (0.5 mcg total) by mouth daily. 10/09/15   Reyne Dumas, MD  calcium acetate (PHOSLO) 667 MG capsule Take 2 capsules (1,334 mg total) by mouth 3 (three) times daily with meals. 10/09/15   Reyne Dumas, MD  multivitamin (RENA-VIT) TABS tablet Take 1 tablet by mouth daily.    Historical Provider, MD  oxyCODONE-acetaminophen (PERCOCET/ROXICET) 5-325 MG tablet Take 2 tablets by mouth every 4 (four) hours as needed for severe pain. 08/31/16   Nona Dell, PA-C  predniSONE (DELTASONE) 20 MG tablet Take 2 tablets (40 mg total) by mouth daily. 08/31/16   Chesley Noon Nadeau, PA-C  SENSIPAR 30 MG tablet Take 1 capsule by mouth daily. 12/08/15   Historical Provider, MD  traZODone (DESYREL) 50 MG tablet  06/26/16   Historical Provider, MD  Vitamin D, Ergocalciferol, (DRISDOL) 50000 units CAPS capsule Take 50,000 Units by mouth 3 (three) times a week.    Historical Provider, MD    Family History Family History  Problem Relation Age of Onset  . Other Mother     shot and killed  . Cirrhosis Father     Social History Social History  Substance Use Topics  . Smoking status: Never Smoker  . Smokeless tobacco: Never Used  . Alcohol use No     Allergies   Penicillins   Review of Systems Review of Systems  Constitutional: Negative for chills and fever.  HENT: Negative for congestion and facial swelling.   Eyes: Negative for discharge and visual disturbance.  Respiratory: Negative for shortness of breath.   Cardiovascular: Positive for palpitations. Negative for chest pain.  Gastrointestinal: Negative for abdominal pain, diarrhea and  vomiting.  Musculoskeletal: Negative for arthralgias and myalgias.  Skin: Negative for color change and rash.  Neurological: Negative for tremors, syncope and headaches.  Psychiatric/Behavioral: Negative for confusion and dysphoric mood.     Physical Exam Updated Vital Signs BP (!) 123/92 (BP Location: Right Arm)   Pulse 91   Temp 98.4 F (36.9 C) (Oral)   Resp 18   Ht 5\' 9"  (1.753 m)   Wt 219 lb (99.3 kg)   SpO2 94%   BMI 32.34 kg/m   Physical Exam  Constitutional: He is oriented to person, place, and time. He appears well-developed and well-nourished.  HENT:  Head: Normocephalic and atraumatic.  Eyes: EOM are normal. Pupils are equal, round, and reactive to light.  Neck: Normal range of motion. Neck supple. No JVD present.  Cardiovascular: Normal rate and regular rhythm.  Exam reveals no gallop and no friction rub.   No murmur heard.  Pulmonary/Chest: No respiratory distress. He has no wheezes.  Abdominal: He exhibits no distension. There is no rebound and no guarding.  Musculoskeletal: Normal range of motion.  Neurological: He is alert and oriented to person, place, and time.  Skin: No rash noted. No pallor.  Psychiatric: He has a normal mood and affect. His behavior is normal.  Nursing note and vitals reviewed.    ED Treatments / Results  Labs (all labs ordered are listed, but only abnormal results are displayed) Labs Reviewed - No data to display  EKG  EKG Interpretation  Date/Time:  Friday September 08 2016 11:54:17 EDT Ventricular Rate:  92 PR Interval:    QRS Duration: 99 QT Interval:  397 QTC Calculation: 492 R Axis:   -12 Text Interpretation:  Sinus rhythm Probable left atrial enlargement Abnormal R-wave progression, early transition Borderline prolonged QT interval No significant change since last tracing Confirmed by Shantese Raven MD, DANIEL ([id number]) on 09/08/2016 11:57:30 AM       Radiology No results found.  Procedures Procedures (including critical care  time)  Medications Ordered in ED Medications - No data to display   Initial Impression / Assessment and Plan / ED Course  I have reviewed the triage vital signs and the nursing notes.  Pertinent labs & imaging results that were available during my care of the patient were reviewed by me and considered in my medical decision making (see chart for details).     56 yo M with a chief complaint of SVT. Results spontaneously prior to arrival to the room. See no need for further evaluation as the patient had finished his dialysis is no other clinical signs or symptoms. Suggested that he fill his medicine. Follow-up with cardiology.  2:09 PM:  I have discussed the diagnosis/risks/treatment options with the patient and family and believe the pt to be eligible for discharge home to follow-up with Cards. We also discussed returning to the ED immediately if new or worsening sx occur. We discussed the sx which are most concerning (e.g., sudden worsening pain, fever, inability to tolerate by mouth) that necessitate immediate return. Medications administered to the patient during their visit and any new prescriptions provided to the patient are listed below.  Medications given during this visit Medications - No data to display   The patient appears reasonably screen and/or stabilized for discharge and I doubt any other medical condition or other [facility] requiring further screening, evaluation, or treatment in the ED at this time prior to discharge.    Final Clinical Impressions(s) / ED Diagnoses   Final diagnoses:  SVT (supraventricular tachycardia) [facility])    New Prescriptions Discharge Medication List as of 09/08/2016 12:11 PM       Deno Etienne, DO 09/08/16 1409

## 2016-09-08 NOTE — ED Triage Notes (Signed)
Pt here from dialysis via [facility] for hr of 200 that has now converted to 96 nsr.  Pt was completing his 4th hr of dialysis when staff noticed hr on monitor.  Pt denies sob, chest pain.  VS stable.

## 2016-09-19 ENCOUNTER — Ambulatory Visit: Payer: Commercial Managed Care - HMO | Admitting: Cardiology

## 2016-09-26 ENCOUNTER — Encounter: Payer: Self-pay | Admitting: Cardiology

## 2017-01-31 ENCOUNTER — Encounter (HOSPITAL_COMMUNITY): Payer: Self-pay | Admitting: Emergency Medicine

## 2017-01-31 ENCOUNTER — Emergency Department (HOSPITAL_COMMUNITY)
Admission: EM | Admit: 2017-01-31 | Discharge: 2017-01-31 | Disposition: A | Discharge status: Home / Self Care | Payer: Commercial Managed Care - HMO | Attending: Physician Assistant | Admitting: Physician Assistant

## 2017-01-31 DIAGNOSIS — I471 Supraventricular tachycardia: Secondary | ICD-10-CM | POA: Insufficient documentation

## 2017-01-31 DIAGNOSIS — N183 Chronic kidney disease, stage 3 (moderate): Secondary | ICD-10-CM | POA: Insufficient documentation

## 2017-01-31 DIAGNOSIS — E785 Hyperlipidemia, unspecified: Secondary | ICD-10-CM | POA: Insufficient documentation

## 2017-01-31 DIAGNOSIS — Z79899 Other long term (current) drug therapy: Secondary | ICD-10-CM | POA: Insufficient documentation

## 2017-01-31 DIAGNOSIS — E119 Type 2 diabetes mellitus without complications: Secondary | ICD-10-CM | POA: Insufficient documentation

## 2017-01-31 DIAGNOSIS — I129 Hypertensive chronic kidney disease with stage 1 through stage 4 chronic kidney disease, or unspecified chronic kidney disease: Secondary | ICD-10-CM | POA: Insufficient documentation

## 2017-01-31 DIAGNOSIS — R000 Tachycardia, unspecified: Secondary | ICD-10-CM | POA: Diagnosis present

## 2017-01-31 LAB — I-STAT TROPONIN, ED: TROPONIN I, POC: 0.02 ng/mL (ref 0.00–0.08)

## 2017-01-31 LAB — BASIC METABOLIC PANEL
ANION GAP: 17 — AB (ref 5–15)
BUN: 29 mg/dL — ABNORMAL HIGH (ref 6–20)
CALCIUM: 8.5 mg/dL — AB (ref 8.9–10.3)
CHLORIDE: 97 mmol/L — AB (ref 101–111)
CO2: 26 mmol/L (ref 22–32)
Creatinine, Ser: 10.91 mg/dL — ABNORMAL HIGH (ref 0.61–1.24)
GFR calc Af Amer: 5 mL/min — ABNORMAL LOW (ref >60)
GFR calc non Af Amer: 5 mL/min — ABNORMAL LOW (ref >60)
Glucose, Bld: 90 mg/dL (ref 65–99)
POTASSIUM: 3.5 mmol/L (ref 3.5–5.1)
Sodium: 140 mmol/L (ref 135–145)

## 2017-01-31 LAB — CBC
HEMATOCRIT: 41.8 % (ref 39.0–52.0)
HEMOGLOBIN: 13.5 g/dL (ref 13.0–17.0)
MCH: 30.7 pg (ref 26.0–34.0)
MCHC: 32.3 g/dL (ref 30.0–36.0)
MCV: 95 fL (ref 78.0–100.0)
Platelets: 160 10*3/uL (ref 150–400)
RBC: 4.4 MIL/uL (ref 4.22–5.81)
RDW: 14.9 % (ref 11.5–15.5)
WBC: 6.2 10*3/uL (ref 4.0–10.5)

## 2017-01-31 NOTE — ED Triage Notes (Addendum)
Pt arrives by [facility] after receiving half of his dialysis treatment he started having heart palpations and staff noted his heart rate to be in 160's. On [facility] arrival pt has heart 160 SVT.  [facility] states pts heart rate converted itself to a NSR without intervention. Pt arrives with left arm dialysis [place] accessed. Pt denies any pain or sob. Pt states he feels "completely normal" at this time.

## 2017-01-31 NOTE — ED Provider Notes (Signed)
[facility] DEPT Provider Note   CSN: [id number] Arrival date & time: 01/31/17  6301     History   Chief Complaint Chief Complaint  Patient presents with  . Tachycardia    HPI [name] is a 56 y.o. male.  Patient is a 56 yo male who presents to ED with fast heartrate. He was about halfway through regularly scheduled dialysis when was noted to have HR in 160s SVT, was asymptomatic. Per chart, this self resolved to NSR per EMS. He denies CP, palpitations, SOB, syncope, lightheadedness. States this has occurred in the past, was supposed to take metoprolol but is noncompliant and was not able to establish with cardiology as previously planned.      Past Medical History:  Diagnosis Date  . Anemia 10/01/2015  . Arthritis    SPINE  . Borderline diabetes mellitus   . BPH (benign prostatic hypertrophy)   . Cancer ([place])   . CKD (chronic kidney disease), stage III   . Diabetes mellitus without complication (HCC)    borderline on no meds   . Dialysis patient ([place])   . Elevated PSA   . Gout    STABLE  PER PT 10-22-2013  . H/O hiatal hernia   . Hyperlipidemia   . Hypertension   . Microhematuria   . Nocturia   . OSA (obstructive sleep apnea)    PER PT STUDY DONE 2005 (APPROX).  NON- COMPLIANT CPAP  . SVT (supraventricular tachycardia) ([place])   . Wears glasses     Patient Active Problem List   Diagnosis Date Noted  . Symptomatic anemia   . CKD (chronic kidney disease)   . Hemoptysis 10/01/2015  . Anemia 10/01/2015  . Unintentional weight loss 10/01/2015  . OSA (obstructive sleep apnea)   . Baker's cyst of knee   . Joint effusion, knee   . Effusion of right knee 09/21/2014  . Epididymitis 09/21/2014  . Prostate cancer ([place]) 12/18/2013  . Weakness generalized 05/30/2013  . Prolonged QT interval 05/30/2013  . Other and unspecified hyperlipidemia 05/30/2013  . Essential hypertension, benign 05/30/2013  . BPH (benign prostatic hypertrophy) 05/30/2013  . Acute gout  05/30/2013  . CKD (chronic kidney disease) stage 3, GFR 30-59 ml/min 05/30/2013    Past Surgical History:  Procedure Laterality Date  . AV FISTULA PLACEMENT Left 08/17/2015   Procedure: BRACHIOCEPHALIC ARTERIOVENOUS (AV) FISTULA CREATION  left arm;  Surgeon: Conrad Oneida, MD;  Location[facility];  Service: Vascular;  Laterality: Left;  . 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 CARDIAC CATHETERIZATION  04-07-2002  DR [facility]   NON-CRITICAL CAD/  PRESERVED LV  . CYSTOSCOPY W/ RETROGRADES Bilateral 10/27/2013   Procedure: CYSTOSCOPY WITH RETROGRADE PYELOGRAM;  Surgeon: Molli Hazard, MD;  [facility]  Service: Urology;  Laterality: Bilateral;  . LYMPHADENECTOMY Bilateral 12/18/2013   Procedure: PELVIC LYMPH NODE DISSECTION;  Surgeon: Alexis Frock, MD;  Location: [facility];  Service: Urology;  Laterality: Bilateral;  . PROSTATE BIOPSY N/A 10/27/2013   Procedure: BIOPSY TRANSRECTAL ULTRASONIC PROSTATE (TUBP);  Surgeon: Molli Hazard, MD;  Location: [facility];  Service: Urology;  Laterality: N/A;  . PROSTATE SURGERY    . RIGHT HAND SURGERY  YRS AGO  . ROBOT ASSISTED LAPAROSCOPIC RADICAL PROSTATECTOMY N/A 12/18/2013   Procedure: ROBOTIC ASSISTED LAPAROSCOPIC RADICAL PROSTATECTOMY AND INDOCYANINE GREEN DYE;  Surgeon: Alexis Frock, MD;  Location:  [facility];  Service: Urology;  Laterality: N/A;  . TRANSTHORACIC ECHOCARDIOGRAM  07-03-2006   MILD - MODERATE LVH/  EF 55-60%/  GRADE I DIASTOIC DYSFUNCTION/  TRIVIAL  TR /  TRIVIAL PERICARDIAL EFFUSION       Home Medications    Prior to Admission medications   Medication Sig Start Date End Date Taking? Authorizing Provider  calcitRIOL (ROCALTROL) 0.5 MCG capsule Take 1 capsule (0.5 mcg total) by mouth daily. 10/09/15   Reyne Dumas, MD  calcium acetate (PHOSLO) 667 MG capsule Take 2 capsules (1,334 mg total) by mouth 3  (three) times daily with meals. 10/09/15   Reyne Dumas, MD  multivitamin (RENA-VIT) TABS tablet Take 1 tablet by mouth daily.    [provider]  oxyCODONE-acetaminophen (PERCOCET/ROXICET) 5-325 MG tablet Take 2 tablets by mouth every 4 (four) hours as needed for severe pain. 08/31/16   Nona Dell, PA-C  predniSONE (DELTASONE) 20 MG tablet Take 2 tablets (40 mg total) by mouth daily. 08/31/16   Nona Dell, PA-C  SENSIPAR 30 MG tablet Take 1 capsule by mouth daily. 12/08/15   [provider]  traZODone (DESYREL) 50 MG tablet  06/26/16   [provider]  Vitamin D, Ergocalciferol, (DRISDOL) 50000 units CAPS capsule Take 50,000 Units by mouth 3 (three) times a week.    [provider]    Family History Family History  Problem Relation Age of Onset  . Other Mother        shot and killed  . Cirrhosis Father     Social History Social History  Substance Use Topics  . Smoking status: Never Smoker  . Smokeless tobacco: Never Used  . Alcohol use No     Allergies   Penicillins   Review of Systems Review of Systems  Constitutional: Negative for chills and fever.  Respiratory: Negative for chest tightness and shortness of breath.   Cardiovascular: Negative for chest pain and palpitations.  Gastrointestinal: Negative for abdominal pain, nausea and vomiting.  Neurological: Negative for weakness, light-headedness and headaches.     Physical Exam Updated Vital Signs There were no vitals taken for this visit.  Physical Exam  Constitutional: He is oriented to person, place, and time. He appears well-developed and well-nourished. No distress.  HENT:  Head: Normocephalic and atraumatic.  Eyes: Conjunctivae and EOM are normal.  Neck: Normal range of motion. Neck supple.  Cardiovascular: Normal rate, regular rhythm, normal heart sounds and intact distal pulses.   No murmur heard. Pulmonary/Chest: Effort normal and breath sounds  normal. No respiratory distress. He has no wheezes.  Abdominal: Soft. Bowel sounds are normal. He exhibits no distension. There is no tenderness. There is no rebound and no guarding.  Musculoskeletal: Normal range of motion.  Neurological: He is alert and oriented to person, place, and time.  Skin: Skin is warm and dry. Capillary refill takes less than 2 seconds.  Psychiatric: He has a normal mood and affect.     ED Treatments / Results  Labs (all labs ordered are listed, but only abnormal results are displayed) Labs Reviewed  CBC  BASIC METABOLIC PANEL  I-STAT TROPONIN, ED    EKG  EKG Interpretation  Date/Time:  Wednesday January 31 2017 08:32:01 EDT Ventricular Rate:  90 PR Interval:    QRS Duration: 93 QT Interval:  373 QTC Calculation: 454 R Axis:   2 Text Interpretation:  Sinus rhythm Prolonged PR interval LAE, consider biatrial enlargement Abnormal R-wave progression, early transition Artifact in lead(s)  I II aVR No significant change since last tracing Confirmed by [facility], [place] [phone]) on 01/31/2017 10:02:09 AM       Radiology No results found.  Procedures Procedures (including critical care time)  Medications Ordered in ED Medications - No data to display   Initial Impression / Assessment and Plan / ED Course  I have reviewed the triage vital signs and the nursing notes.  Pertinent labs & imaging results that were available during my care of the patient were reviewed by me and considered in my medical decision making (see chart for details).   Patient is a 56 yo male who had asymptomatic SVT with HR in 160s during regularly scheduled dialysis today. Spontaneously went back into NSR and has been asymptomatic during the entirety of this episode. Per chart review, this is not new for him. No EKG changes today. Instructed patient to resume his metoprolol which he confirmed that he has at home and establish with cardiology. Patient voiced good understanding of  this. Does not appear fluid overloaded and can resume his regularly scheduled dialysis.  Final Clinical Impressions(s) / ED Diagnoses   Final diagnoses:  SVT (supraventricular tachycardia) [facility])    New Prescriptions New Prescriptions   No medications on file     Bufford Lope, DO 01/31/17 1019    [place], Fredia Sorrow, MD 02/01/17 1516

## 2017-01-31 NOTE — Discharge Instructions (Signed)
Please call and make appointment to be seen by cardiology, contact information is below. Please restart your home metoprolol like we talked about today. You can resume your regularly scheduled dialysis.

## 2017-08-13 ENCOUNTER — Emergency Department (HOSPITAL_COMMUNITY): Payer: 59

## 2017-08-13 ENCOUNTER — Encounter (HOSPITAL_COMMUNITY): Payer: Self-pay

## 2017-08-13 ENCOUNTER — Emergency Department (HOSPITAL_COMMUNITY)
Admission: EM | Admit: 2017-08-13 | Discharge: 2017-08-13 | Disposition: A | Discharge status: Home / Self Care | Payer: 59 | Attending: Emergency Medicine | Admitting: Emergency Medicine

## 2017-08-13 ENCOUNTER — Other Ambulatory Visit: Payer: Self-pay

## 2017-08-13 DIAGNOSIS — Y9389 Activity, other specified: Secondary | ICD-10-CM | POA: Insufficient documentation

## 2017-08-13 DIAGNOSIS — W0110XA Fall on same level from slipping, tripping and stumbling with subsequent striking against unspecified object, initial encounter: Secondary | ICD-10-CM | POA: Insufficient documentation

## 2017-08-13 DIAGNOSIS — T148XXA Other injury of unspecified body region, initial encounter: Secondary | ICD-10-CM

## 2017-08-13 DIAGNOSIS — Z8546 Personal history of malignant neoplasm of prostate: Secondary | ICD-10-CM | POA: Diagnosis not present

## 2017-08-13 DIAGNOSIS — Z992 Dependence on renal dialysis: Secondary | ICD-10-CM | POA: Insufficient documentation

## 2017-08-13 DIAGNOSIS — W19XXXA Unspecified fall, initial encounter: Secondary | ICD-10-CM

## 2017-08-13 DIAGNOSIS — N183 Chronic kidney disease, stage 3 (moderate): Secondary | ICD-10-CM | POA: Diagnosis not present

## 2017-08-13 DIAGNOSIS — I129 Hypertensive chronic kidney disease with stage 1 through stage 4 chronic kidney disease, or unspecified chronic kidney disease: Secondary | ICD-10-CM | POA: Diagnosis not present

## 2017-08-13 DIAGNOSIS — Y92003 Bedroom of unspecified non-institutional (private) residence as the place of occurrence of the external cause: Secondary | ICD-10-CM | POA: Diagnosis not present

## 2017-08-13 DIAGNOSIS — S8991XA Unspecified injury of right lower leg, initial encounter: Secondary | ICD-10-CM | POA: Diagnosis present

## 2017-08-13 DIAGNOSIS — Z79899 Other long term (current) drug therapy: Secondary | ICD-10-CM | POA: Insufficient documentation

## 2017-08-13 DIAGNOSIS — Y998 Other external cause status: Secondary | ICD-10-CM | POA: Diagnosis not present

## 2017-08-13 DIAGNOSIS — S80211A Abrasion, right knee, initial encounter: Secondary | ICD-10-CM | POA: Insufficient documentation

## 2017-08-13 DIAGNOSIS — M25511 Pain in right shoulder: Secondary | ICD-10-CM | POA: Diagnosis not present

## 2017-08-13 MED ORDER — DICLOFENAC SODIUM 1 % TD GEL: 4.0000 g | Freq: Four times a day (QID) | TRANSDERMAL | 0 refills | Status: DC

## 2017-08-13 MED ORDER — ACETAMINOPHEN 500 MG PO TABS
500.0000 mg | ORAL_TABLET | Freq: Once | ORAL | Status: AC
  Administered 2017-08-13: 500 mg via ORAL
  Filled 2017-08-13: qty 1

## 2017-08-13 NOTE — ED Notes (Signed)
Patient verbalized understanding of discharge instructions and denies any further needs or questions at this time. VS stable. Patient ambulatory with steady gait.  

## 2017-08-13 NOTE — ED Triage Notes (Signed)
Pt presents to the ed after falling today landing on his right arm, complains of pain in his right arm and right knee. Pt is ambulatory. Denies head trauma. Pt is dialysis pt and went today.

## 2017-08-13 NOTE — ED Provider Notes (Signed)
  Patient placed in Quick Look pathway, seen and evaluated for chief complaint of trip and fall with injury to right shoulder. He denies LOC. Last had dialysis today. Tdap up to date.  Pertinent H&P findings include TTP to right anterior shoulder. Good ROM. Speech is clear and coherent, non-toxic appearing, no respiratory distress.  .  Tdap up to date. Based on initial evaluation, labs are not indicated and radiology studies are indicated.  Patient counseled on process, plan, and necessity for staying for completing the evaluation.    Waynetta Pean, PA-C 08/13/17 [place], MD 08/13/17 [phone]

## 2017-08-13 NOTE — Discharge Instructions (Signed)
Please read and follow all provided instructions.  You have been seen today for Right shoulder pain  Tests performed today include: An x-ray of the affected area - does NOT show any broken bones or dislocations.  Vital signs. See below for your results today.   Home care instructions: -- *PRICE in the first 24-48 hours after injury: Protect (with brace, splint, sling), if given by your provider - please take your shoulder out of sling and perform shoulder range of motion exercises daily.  Rest Ice- Do not apply ice pack directly to your skin, place towel or similar between your skin and ice/ice pack. Apply ice for 20 min, then remove for 40 min while awake Compression- Wear brace, elastic bandage, splint as directed by your provider Elevate affected extremity above the level of your heart when not walking around for the first 24-48 hours   Please avoid NSAIDs such as ibuprofen due to your chronic kidney disease.  I am prescribing Voltaren Gel.  Please take Tylenol 650 mg every 6 hours otherwise as needed for pain.  Follow-up instructions: Please follow-up with your primary care provider or the provided orthopedic physician (bone specialist) if you continue to have significant pain in 1 week. In this case you may have a more severe injury that requires further care.   Return instructions:  Please return if your toes or feet are numb or tingling, appear gray or blue, or you have severe pain (also elevate the leg and loosen splint or wrap if you were given one) Please return to the Emergency Department if you experience worsening symptoms.  Please return if you have any other emergent concerns. Additional Information:  Your vital signs today were: BP 126/87    Pulse 89    Temp 98.7 F (37.1 C)    Resp 18    Wt 99.3 kg (219 lb)    SpO2 100%    BMI 32.34 kg/m  If your blood pressure (BP) was elevated above 135/85 this visit, please have this repeated by your doctor within one  month. ---------------

## 2017-08-13 NOTE — ED Provider Notes (Signed)
[facility] EMERGENCY DEPARTMENT Provider Note   CSN: [id number] Arrival date & time: 08/13/17  1306     History   Chief Complaint Chief Complaint  Patient presents with  . Fall    HPI [name] is a 57 y.o. male past medical history of CKD (completed dialysis today) who presents the emergency room today for follow-up.  Patient states that he was wearing his "bedroom slippers" when he was shoveling into his bedroom that caught the carpet and caused him to trip and fall onto his right shoulder.  He denies head trauma or loss of consciousness.  He notes that he also scraped his right knee.  Complaining of pain with movement of the right shoulder in the anterior aspect.  He has not taken anything for this.  He denies any numbness/tingling/weakness. Tetanus up to date.   HPI  Past Medical History:  Diagnosis Date  . Anemia 10/01/2015  . Arthritis    SPINE  . Borderline diabetes mellitus   . BPH (benign prostatic hypertrophy)   . Cancer ([place])   . CKD (chronic kidney disease), stage III ([place])   . Diabetes mellitus without complication (HCC)    borderline on no meds   . Dialysis patient ([place])   . Elevated PSA   . Gout    STABLE  PER PT 10-22-2013  . H/O hiatal hernia   . Hyperlipidemia   . Hypertension   . Microhematuria   . Nocturia   . OSA (obstructive sleep apnea)    PER PT STUDY DONE 2005 (APPROX).  NON- COMPLIANT CPAP  . SVT (supraventricular tachycardia) ([place])   . Wears glasses     Patient Active Problem List   Diagnosis Date Noted  . Symptomatic anemia   . CKD (chronic kidney disease)   . Hemoptysis 10/01/2015  . Anemia 10/01/2015  . Unintentional weight loss 10/01/2015  . OSA (obstructive sleep apnea)   . Baker's cyst of knee   . Joint effusion, knee   . Effusion of right knee 09/21/2014  . Epididymitis 09/21/2014  . Prostate cancer ([place]) 12/18/2013  . Weakness generalized 05/30/2013  . Prolonged QT interval 05/30/2013  . Other and  unspecified hyperlipidemia 05/30/2013  . Essential hypertension, benign 05/30/2013  . BPH (benign prostatic hypertrophy) 05/30/2013  . Acute gout 05/30/2013  . CKD (chronic kidney disease) stage 3, GFR 30-59 ml/min ([place]) 05/30/2013    Past Surgical History:  Procedure Laterality Date  . AV FISTULA PLACEMENT Left 08/17/2015   Procedure: BRACHIOCEPHALIC ARTERIOVENOUS (AV) FISTULA CREATION  left arm;  Surgeon: Conrad Casco, MD;  Location: [facility];  Service: Vascular;  Laterality: Left;  . 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 CARDIAC CATHETERIZATION  04-07-2002  DR [facility]   NON-CRITICAL CAD/  PRESERVED LV  . CYSTOSCOPY W/ RETROGRADES Bilateral 10/27/2013   Procedure: CYSTOSCOPY WITH RETROGRADE PYELOGRAM;  Surgeon: Molli Hazard, MD;  Location: [facility];  Service: Urology;  Laterality: Bilateral;  . LYMPHADENECTOMY Bilateral 12/18/2013   Procedure: PELVIC LYMPH NODE DISSECTION;  Surgeon: Alexis Frock, MD;  [facility];  Service: Urology;  Laterality: Bilateral;  . PROSTATE BIOPSY N/A 10/27/2013   Procedure: BIOPSY TRANSRECTAL ULTRASONIC PROSTATE (TUBP);  Surgeon: Molli Hazard, MD;  Location: [facility];  Service: Urology;  Laterality: N/A;  . PROSTATE SURGERY    . RIGHT HAND SURGERY  YRS AGO  .  ROBOT ASSISTED LAPAROSCOPIC RADICAL PROSTATECTOMY N/A 12/18/2013   Procedure: ROBOTIC ASSISTED LAPAROSCOPIC RADICAL PROSTATECTOMY AND INDOCYANINE GREEN DYE;  Surgeon: Alexis Frock, MD;  [facility];  Service: Urology;  Laterality: N/A;  . TRANSTHORACIC ECHOCARDIOGRAM  07-03-2006   MILD - MODERATE LVH/  EF 55-60%/  GRADE I DIASTOIC DYSFUNCTION/  TRIVIAL  TR /  TRIVIAL PERICARDIAL EFFUSION       Home Medications    Prior to Admission medications   Medication Sig Start Date End Date Taking? Authorizing Provider  calcitRIOL (ROCALTROL) 0.5 MCG capsule Take 1  capsule (0.5 mcg total) by mouth daily. 10/09/15   Reyne Dumas, MD  calcium acetate (PHOSLO) 667 MG capsule Take 2 capsules (1,334 mg total) by mouth 3 (three) times daily with meals. 10/09/15   Reyne Dumas, MD  LORazepam (ATIVAN) 0.5 MG tablet Take 0.5 mg by mouth at bedtime as needed. 12/31/16   [provider]  multivitamin (RENA-VIT) TABS tablet Take 1 tablet by mouth daily.    [provider]  oxyCODONE-acetaminophen (PERCOCET/ROXICET) 5-325 MG tablet Take 2 tablets by mouth every 4 (four) hours as needed for severe pain. Patient not taking: Reported on 01/31/2017 08/31/16   Nona Dell, PA-C  predniSONE (DELTASONE) 20 MG tablet Take 2 tablets (40 mg total) by mouth daily. Patient not taking: Reported on 01/31/2017 08/31/16   Nona Dell, PA-C  SENSIPAR 30 MG tablet Take 1 capsule by mouth daily. 12/08/15   [provider]  traZODone (DESYREL) 50 MG tablet Take 50 mg by mouth at bedtime as needed for sleep.  06/26/16   [provider]  Vitamin D, Ergocalciferol, (DRISDOL) 50000 units CAPS capsule Take 50,000 Units by mouth 3 (three) times a week.    [provider]    Family History Family History  Problem Relation Age of Onset  . Other Mother        shot and killed  . Cirrhosis Father     Social History Social History   Tobacco Use  . Smoking status: Never Smoker  . Smokeless tobacco: Never Used  Substance Use Topics  . Alcohol use: No  . Drug use: No     Allergies   Penicillins   Review of Systems Review of Systems  Constitutional: Negative for fever.  Musculoskeletal: Positive for arthralgias. Negative for joint swelling.  Skin: Negative for color change and wound.  Neurological: Negative for numbness.     Physical Exam Updated Vital Signs BP 126/87   Pulse 89   Temp 98.7 F (37.1 C)   Resp 18   Wt 99.3 kg (219 lb)   SpO2 100%   BMI 32.34 kg/m   Physical Exam  Constitutional: He appears  well-developed and well-nourished.  HENT:  Head: Normocephalic and atraumatic.  Right Ear: External ear normal.  Left Ear: External ear normal.  Eyes: Conjunctivae are normal. Right eye exhibits no discharge. Left eye exhibits no discharge. No scleral icterus.  Cardiovascular:  Pulses:      Radial pulses are 2+ on the right side, and 2+ on the left side.       Dorsalis pedis pulses are 2+ on the right side.       Posterior tibial pulses are 2+ on the right side.  Pulmonary/Chest: Effort normal. No respiratory distress.  Musculoskeletal:       Right shoulder: He exhibits decreased range of motion.       Right elbow: Normal.      Right wrist: Normal.  Right knee: He exhibits normal range of motion, no swelling, no effusion, no erythema and no LCL laxity. No tenderness found. No medial joint line, no lateral joint line, no MCL, no LCL and no patellar tendon tenderness noted.  Right shoulder: Appearance normal. No obvious bony deformity. No skin swelling, erythema, heat, fluctuance or break of the skin. No clavicular deformity or TTP. TTP over anterior shoulder. ROM able but limited 2/2 to pain. Strength for flexion, extension, abduction, adduction, and internal/external rotation intact and appropriate for age. Negative drop arm test.   Right Elbow: Appearance normal. No obvious bony deformity. No skin swelling, erythema, heat, fluctuance or break of the skin. No TTP over joint. Active flexion, extension, supination and pronation full and intact without pain. Strength able and appropriate for age for flexion and extension.  No TTP of the wrist. Normal ROM for flexion, extension, inversion and eversion.  No snuffbox TTP.  Radial Pulse 2+. Cap refill <2 seconds. SILT for M/U/R distributions. Compartments soft.    Neurological: He is alert. He has normal strength. No sensory deficit. He exhibits normal muscle tone. Gait normal.  Skin: Skin is warm and dry. Capillary refill takes less than 2  seconds. Abrasion (right knee) noted. No pallor.  Psychiatric: He has a normal mood and affect.  Nursing note and vitals reviewed.    ED Treatments / Results  Labs (all labs ordered are listed, but only abnormal results are displayed) Labs Reviewed - No data to display  EKG  EKG Interpretation None       Radiology Dg Shoulder Right  Result Date: 08/13/2017 CLINICAL DATA:  Right anterior and posterior shoulder since falling this morning. History of diabetes and hypertension. EXAM: RIGHT SHOULDER - 2+ VIEW COMPARISON:  None. FINDINGS: The mineralization and alignment are normal. There is no evidence of acute fracture or dislocation. 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 Mild acromioclavicular degenerative changes are present. There is mild spurring at the humeral greater tuberosity. The subacromial space appears adequately preserved. IMPRESSION: No acute osseous findings. Unfused os acromiale may contribute to rotator cuff impingement. Electronically Signed   By: Richardean Sale M.D.   On: 08/13/2017 14:25    Procedures Procedures (including critical care time) SPLINT APPLICATION Date/Time: 6:30 PM Authorized by: Jillyn Ledger Consent: Verbal consent obtained. Risks and benefits: risks, benefits and alternatives were discussed Consent given by: patient Splint applied by: orthopedic technician Location details: right shoulder Splint type: sling Supplies used: sling Post-procedure: The splinted body part was neurovascularly unchanged following the procedure. Patient tolerance: Patient tolerated the procedure well with no immediate complications.  Medications Ordered in ED Medications  acetaminophen (TYLENOL) tablet 500 mg (not administered)     Initial Impression / Assessment and Plan / ED Course  I have reviewed the triage vital signs and the nursing notes.  Pertinent labs & imaging results that were available during my care of the patient  were reviewed by me and considered in my medical decision making (see chart for details).     57 y.o. male with mechanical fall today now complaining of right shoulder pain worse with range of motion. Patient is NVI. Patient X-Ray negative for obvious fracture or dislocation. Pain managed in ED. Pt advised to follow up with orthopedics if symptoms persist for possibility of missed fracture diagnosis. Patient given shoulder sling while in ED. Advised patient to take shoulder out of sling 1-2 times per day and perform shoulder range of motion exercises  in order to prevent frozen shoulder. Conservative therapy recommended and discussed. Patient also noted to have right knee abrasion. Normal ROM and no body TTP. Do not suspect fx. Wound cleansed in the ED and dressed. Will provide wound care instructions. Tetanus is up to date. Return precautions discussed. Patient will be dc home & is agreeable with above plan.   Final Clinical Impressions(s) / ED Diagnoses   Final diagnoses:  Fall, initial encounter  Acute pain of right shoulder  Abrasion    ED Discharge Orders        Ordered    diclofenac sodium (VOLTAREN) 1 % GEL  4 times daily     08/13/17 1553       Lorelle Gibbs 08/13/17 1616    Drenda Freeze, MD 08/14/17 [phone]

## 2017-09-07 ENCOUNTER — Emergency Department (HOSPITAL_COMMUNITY)
Admission: EM | Admit: 2017-09-07 | Discharge: 2017-09-07 | Disposition: A | Discharge status: Home / Self Care | Payer: 59 | Attending: Emergency Medicine | Admitting: Emergency Medicine

## 2017-09-07 ENCOUNTER — Emergency Department (HOSPITAL_COMMUNITY): Payer: 59

## 2017-09-07 ENCOUNTER — Other Ambulatory Visit: Payer: Self-pay

## 2017-09-07 ENCOUNTER — Encounter (HOSPITAL_COMMUNITY): Payer: Self-pay | Admitting: *Deleted

## 2017-09-07 DIAGNOSIS — R000 Tachycardia, unspecified: Secondary | ICD-10-CM | POA: Insufficient documentation

## 2017-09-07 DIAGNOSIS — N183 Chronic kidney disease, stage 3 (moderate): Secondary | ICD-10-CM | POA: Insufficient documentation

## 2017-09-07 DIAGNOSIS — E1122 Type 2 diabetes mellitus with diabetic chronic kidney disease: Secondary | ICD-10-CM | POA: Insufficient documentation

## 2017-09-07 DIAGNOSIS — Z859 Personal history of malignant neoplasm, unspecified: Secondary | ICD-10-CM | POA: Insufficient documentation

## 2017-09-07 DIAGNOSIS — Z79899 Other long term (current) drug therapy: Secondary | ICD-10-CM | POA: Diagnosis not present

## 2017-09-07 DIAGNOSIS — I129 Hypertensive chronic kidney disease with stage 1 through stage 4 chronic kidney disease, or unspecified chronic kidney disease: Secondary | ICD-10-CM | POA: Insufficient documentation

## 2017-09-07 DIAGNOSIS — Z7982 Long term (current) use of aspirin: Secondary | ICD-10-CM | POA: Insufficient documentation

## 2017-09-07 DIAGNOSIS — Z9289 Personal history of other medical treatment: Secondary | ICD-10-CM | POA: Insufficient documentation

## 2017-09-07 LAB — BASIC METABOLIC PANEL
ANION GAP: 13 (ref 5–15)
BUN: 10 mg/dL (ref 6–20)
CO2: 29 mmol/L (ref 22–32)
Calcium: 8.2 mg/dL — ABNORMAL LOW (ref 8.9–10.3)
Chloride: 97 mmol/L — ABNORMAL LOW (ref 101–111)
Creatinine, Ser: 5.63 mg/dL — ABNORMAL HIGH (ref 0.61–1.24)
GFR, EST AFRICAN AMERICAN: 12 mL/min — AB (ref >60)
GFR, EST NON AFRICAN AMERICAN: 10 mL/min — AB (ref >60)
Glucose, Bld: 81 mg/dL (ref 65–99)
Potassium: 3.2 mmol/L — ABNORMAL LOW (ref 3.5–5.1)
SODIUM: 139 mmol/L (ref 135–145)

## 2017-09-07 LAB — I-STAT TROPONIN, ED
TROPONIN I, POC: 0.01 ng/mL (ref 0.00–0.08)
Troponin i, poc: 0.01 ng/mL (ref 0.00–0.08)

## 2017-09-07 LAB — CBC
HCT: 41.6 % (ref 39.0–52.0)
HEMOGLOBIN: 13.4 g/dL (ref 13.0–17.0)
MCH: 31.8 pg (ref 26.0–34.0)
MCHC: 32.2 g/dL (ref 30.0–36.0)
MCV: 98.6 fL (ref 78.0–100.0)
Platelets: 174 10*3/uL (ref 150–400)
RBC: 4.22 MIL/uL (ref 4.22–5.81)
RDW: 14.2 % (ref 11.5–15.5)
WBC: 6.1 10*3/uL (ref 4.0–10.5)

## 2017-09-07 NOTE — Discharge Instructions (Addendum)
Continue your current meds.   See cardiology for follow up   Return to ER if you have chest pain, trouble breathing, palpitations

## 2017-09-07 NOTE — ED Notes (Addendum)
Restricted extremity band placed on left arm

## 2017-09-07 NOTE — ED Notes (Signed)
Patient verbalizes understanding of discharge instructions. Opportunity for questioning and answers were provided. Armband removed by staff, pt discharged from ED ambulatory.   

## 2017-09-07 NOTE — ED Provider Notes (Signed)
[place] EMERGENCY DEPARTMENT Provider Note   CSN: [id number] Arrival date & time: 09/07/17  1110     History   Chief Complaint Chief Complaint  Patient presents with  . Tachycardia    HPI [name] is a 57 y.o. male history of CKD on dialysis, hyperlipidemia, hypertension here presenting with tachycardia.  Patient was at dialysis today and had sudden onset of tachycardia.  He also had an episode of vomiting at that time.  Denies any chest pain or shortness of breath.  He did end up finishing dialysis was sent here for evaluation. Per chart review, patient does have a history of SVT and is taking metoprolol.  Patient does not have a history of atrial fibrillation.  Denies any fever or chills.   The history is provided by the patient.    Past Medical History:  Diagnosis Date  . Anemia 10/01/2015  . Arthritis    SPINE  . Borderline diabetes mellitus   . BPH (benign prostatic hypertrophy)   . Cancer ([place])   . CKD (chronic kidney disease), stage III ([place])   . Diabetes mellitus without complication (HCC)    borderline on no meds   . Dialysis patient ([place])   . Elevated PSA   . Gout    STABLE  PER PT 10-22-2013  . H/O hiatal hernia   . Hyperlipidemia   . Hypertension   . Microhematuria   . Nocturia   . OSA (obstructive sleep apnea)    PER PT STUDY DONE 2005 (APPROX).  NON- COMPLIANT CPAP  . SVT (supraventricular tachycardia) ([place])   . Wears glasses     Patient Active Problem List   Diagnosis Date Noted  . Symptomatic anemia   . CKD (chronic kidney disease)   . Hemoptysis 10/01/2015  . Anemia 10/01/2015  . Unintentional weight loss 10/01/2015  . OSA (obstructive sleep apnea)   . Baker's cyst of knee   . Joint effusion, knee   . Effusion of right knee 09/21/2014  . Epididymitis 09/21/2014  . Prostate cancer ([place]) 12/18/2013  . Weakness generalized 05/30/2013  . Prolonged QT interval 05/30/2013  . Other and unspecified hyperlipidemia  05/30/2013  . Essential hypertension, benign 05/30/2013  . BPH (benign prostatic hypertrophy) 05/30/2013  . Acute gout 05/30/2013  . CKD (chronic kidney disease) stage 3, GFR 30-59 ml/min ([place]) 05/30/2013    Past Surgical History:  Procedure Laterality Date  . AV FISTULA PLACEMENT Left 08/17/2015   Procedure: BRACHIOCEPHALIC ARTERIOVENOUS (AV) FISTULA CREATION  left arm;  Surgeon: Conrad Elkton, MD;  Location: [place];  Service: Vascular;  Laterality: Left;  . 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 CARDIAC CATHETERIZATION  04-07-2002  DR [facility]   NON-CRITICAL CAD/  PRESERVED LV  . CYSTOSCOPY W/ RETROGRADES Bilateral 10/27/2013   Procedure: CYSTOSCOPY WITH RETROGRADE PYELOGRAM;  Surgeon: Molli Hazard, MD;  Location: [facility];  Service: Urology;  Laterality: Bilateral;  . LYMPHADENECTOMY Bilateral 12/18/2013   Procedure: PELVIC LYMPH NODE DISSECTION;  Surgeon: Alexis Frock, MD;  Location: [facility];  Service: Urology;  Laterality: Bilateral;  . PROSTATE BIOPSY N/A 10/27/2013   Procedure: BIOPSY TRANSRECTAL ULTRASONIC PROSTATE (TUBP);  Surgeon: Molli Hazard, MD;  Location[facility];  Service: Urology;  Laterality: N/A;  . PROSTATE SURGERY    . RIGHT HAND SURGERY  YRS AGO  . ROBOT ASSISTED LAPAROSCOPIC  RADICAL PROSTATECTOMY N/A 12/18/2013   Procedure: ROBOTIC ASSISTED LAPAROSCOPIC RADICAL PROSTATECTOMY AND INDOCYANINE GREEN DYE;  Surgeon: Alexis Frock, MD;  Location: [facility];  Service: Urology;  Laterality: N/A;  . TRANSTHORACIC ECHOCARDIOGRAM  07-03-2006   MILD - MODERATE LVH/  EF 55-60%/  GRADE I DIASTOIC DYSFUNCTION/  TRIVIAL  TR /  TRIVIAL PERICARDIAL EFFUSION        Home Medications    Prior to Admission medications   Medication Sig Start Date End Date Taking? Authorizing Provider  amLODipine (NORVASC) 5 MG tablet Take 5 mg by mouth daily. 08/23/17  Yes  [provider]  aspirin EC 81 MG tablet Take 81 mg by mouth daily. 04/16/17  Yes [provider]  atorvastatin (LIPITOR) 40 MG tablet Take 40 mg by mouth every other day. 04/17/17  Yes [provider]  calcitRIOL (ROCALTROL) 0.5 MCG capsule Take 1 capsule (0.5 mcg total) by mouth daily. 10/09/15  Yes Reyne Dumas, MD  calcium acetate (PHOSLO) 667 MG capsule Take 2 capsules (1,334 mg total) by mouth 3 (three) times daily with meals. Patient taking differently: Take 2,668 mg by mouth 3 (three) times daily with meals.  10/09/15  Yes Reyne Dumas, MD  diclofenac sodium (VOLTAREN) 1 % GEL Apply 4 g topically 4 (four) times daily. 08/13/17  Yes Maczis, Barth Kirks, PA-C  LORazepam (ATIVAN) 0.5 MG tablet Take 0.5 mg by mouth at bedtime as needed. 12/31/16  Yes [provider]  multivitamin (RENA-VIT) TABS tablet Take 1 tablet by mouth daily.   Yes [provider]  SENSIPAR 30 MG tablet Take 1 capsule by mouth daily. 12/08/15  Yes [provider]  Vitamin D, Ergocalciferol, (DRISDOL) 50000 units CAPS capsule Take 50,000 Units by mouth 3 (three) times a week.   Yes [provider]  oxyCODONE-acetaminophen (PERCOCET/ROXICET) 5-325 MG tablet Take 2 tablets by mouth every 4 (four) hours as needed for severe pain. Patient not taking: Reported on 01/31/2017 08/31/16   Nona Dell, PA-C    Family History Family History  Problem Relation Age of Onset  . Other Mother        shot and killed  . Cirrhosis Father     Social History Social History   Tobacco Use  . Smoking status: Never Smoker  . Smokeless tobacco: Never Used  Substance Use Topics  . Alcohol use: No  . Drug use: No     Allergies   Penicillins   Review of Systems Review of Systems  Cardiovascular: Positive for palpitations.  All other systems reviewed and are negative.    Physical Exam Updated Vital Signs BP 110/75 (BP Location: Right Arm)   Pulse 75    Temp 98.7 F (37.1 C) (Oral)   Resp (!) 22   Ht 5\' 10"  (1.778 m)   Wt 99.8 kg (220 lb)   SpO2 99%   BMI 31.57 kg/m   Physical Exam  Constitutional: He is oriented to person, place, and time.  Chronically ill, NAD   HENT:  Head: Normocephalic.  Eyes: Pupils are equal, round, and reactive to light. Conjunctivae and EOM are normal.  Neck: Normal range of motion. Neck supple.  Cardiovascular: Normal rate, regular rhythm and normal heart sounds.  Pulmonary/Chest: Effort normal and breath sounds normal. No stridor. No respiratory distress. He has no wheezes.  Abdominal: Soft. Bowel sounds are normal. He exhibits no distension. There is no tenderness.  Musculoskeletal: Normal range of motion.  Neurological: He is alert and oriented to person, place,  and time.  Skin: Skin is warm.  Psychiatric: He has a normal mood and affect.  Nursing note and vitals reviewed.    ED Treatments / Results  Labs (all labs ordered are listed, but only abnormal results are displayed) Labs Reviewed  BASIC METABOLIC PANEL - Abnormal; Notable for the following components:      Result Value   Potassium 3.2 (*)    Chloride 97 (*)    Creatinine, Ser 5.63 (*)    Calcium 8.2 (*)    GFR calc non Af Amer 10 (*)    GFR calc Af Amer 12 (*)    All other components within normal limits  CBC  I-STAT TROPONIN, ED  I-STAT TROPONIN, ED    EKG EKG Interpretation  Date/Time:  Friday September 07 2017 11:26:19 EDT Ventricular Rate:  101 PR Interval:  194 QRS Duration: 90 QT Interval:  368 QTC Calculation: 477 R Axis:   -8 Text Interpretation:  Sinus tachycardia Left ventricular hypertrophy Nonspecific T wave abnormality Abnormal ECG No significant change since last tracing Confirmed by Wandra Arthurs [phone]) on 09/07/2017 3:20:33 PM   EKG Interpretation  Date/Time:  Friday September 07 2017 15:44:05 EDT Ventricular Rate:  70 PR Interval:  194 QRS Duration: 99 QT Interval:  464 QTC Calculation: 501 R  Axis:   -9 Text Interpretation:  Sinus rhythm Prolonged PR interval Abnormal R-wave progression, early transition Left ventricular hypertrophy Prolonged QT interval No significant change since last tracing Confirmed by Wandra Arthurs [phone]) on 09/07/2017 3:54:27 PM        Radiology Dg Chest 2 View  Result Date: 09/07/2017 CLINICAL DATA:  Tachycardia.  Renal failure. EXAM: CHEST - 2 VIEW COMPARISON:  September 01, 2016 FINDINGS: There is slight right base atelectasis. Lungs elsewhere are clear. Heart is upper normal in size with pulmonary vascularity within normal limits. No adenopathy. No bone lesions. IMPRESSION: Mild right base atelectasis. No edema or consolidation. Stable cardiac silhouette. Electronically Signed   By: Lowella Grip III M.D.   On: 09/07/2017 11:55    Procedures Procedures (including critical care time)  Medications Ordered in ED Medications - No data to display   Initial Impression / Assessment and Plan / ED Course  I have reviewed the triage vital signs and the nursing notes.  Pertinent labs & imaging results that were available during my care of the patient were reviewed by me and considered in my medical decision making (see chart for details).     CORDELRO GAUTREAU is a 57 y.o. male here with tachycardia during dialysis. Has hx of SVT and probably went into SVT. HR in the 70s in the ED. Well appearing. No hx of Afib. Will put on cardiac monitor. Will get trop x 2, CXR. Will check electrolytes since he just finished dialysis.   5:25 PM HR remained in the 70s. Delta trop neg. Cr baseline, K 3.2. CXR stable. Stable for discharge. Has metoprolol at home, recommend outpatient cardiology follow up.    Final Clinical Impressions(s) / ED Diagnoses   Final diagnoses:  None    ED Discharge Orders    None       Drenda Freeze, MD 09/07/17 1739

## 2017-09-07 NOTE — ED Triage Notes (Signed)
Pt reports going to dialysis this am and finished entire treatment but pt was sent here due to HR being elevated > 170. HR is 102 at triage. Pt reports n/v/d since leaving dialysis. ekg done at triage and no acute distress is noted at this time.

## 2017-12-24 ENCOUNTER — Encounter (HOSPITAL_COMMUNITY): Payer: Self-pay

## 2017-12-24 ENCOUNTER — Emergency Department (HOSPITAL_COMMUNITY)
Admission: EM | Admit: 2017-12-24 | Discharge: 2017-12-25 | Disposition: A | Discharge status: Home / Self Care | Payer: 59 | Attending: Emergency Medicine | Admitting: Emergency Medicine

## 2017-12-24 ENCOUNTER — Other Ambulatory Visit: Payer: Self-pay

## 2017-12-24 DIAGNOSIS — R252 Cramp and spasm: Secondary | ICD-10-CM | POA: Insufficient documentation

## 2017-12-24 DIAGNOSIS — N183 Chronic kidney disease, stage 3 (moderate): Secondary | ICD-10-CM | POA: Diagnosis not present

## 2017-12-24 DIAGNOSIS — E119 Type 2 diabetes mellitus without complications: Secondary | ICD-10-CM | POA: Diagnosis not present

## 2017-12-24 DIAGNOSIS — I129 Hypertensive chronic kidney disease with stage 1 through stage 4 chronic kidney disease, or unspecified chronic kidney disease: Secondary | ICD-10-CM | POA: Diagnosis not present

## 2017-12-24 DIAGNOSIS — Z79899 Other long term (current) drug therapy: Secondary | ICD-10-CM | POA: Diagnosis not present

## 2017-12-24 DIAGNOSIS — M545 Low back pain, unspecified: Secondary | ICD-10-CM

## 2017-12-24 LAB — COMPREHENSIVE METABOLIC PANEL
ALT: 8 U/L (ref 0–44)
ANION GAP: 10 (ref 5–15)
AST: 24 U/L (ref 15–41)
Albumin: 4 g/dL (ref 3.5–5.0)
Alkaline Phosphatase: 126 U/L (ref 38–126)
BUN: 17 mg/dL (ref 6–20)
CHLORIDE: 98 mmol/L (ref 98–111)
CO2: 32 mmol/L (ref 22–32)
Calcium: 8.2 mg/dL — ABNORMAL LOW (ref 8.9–10.3)
Creatinine, Ser: 9.22 mg/dL — ABNORMAL HIGH (ref 0.61–1.24)
GFR calc Af Amer: 6 mL/min — ABNORMAL LOW (ref >60)
GFR, EST NON AFRICAN AMERICAN: 6 mL/min — AB (ref >60)
Glucose, Bld: 118 mg/dL — ABNORMAL HIGH (ref 70–99)
POTASSIUM: 4.2 mmol/L (ref 3.5–5.1)
Sodium: 140 mmol/L (ref 135–145)
TOTAL PROTEIN: 7.2 g/dL (ref 6.5–8.1)
Total Bilirubin: 1.4 mg/dL — ABNORMAL HIGH (ref 0.3–1.2)

## 2017-12-24 LAB — CBC WITH DIFFERENTIAL/PLATELET
ABS IMMATURE GRANULOCYTES: 0 10*3/uL (ref 0.0–0.1)
BASOS ABS: 0 10*3/uL (ref 0.0–0.1)
Basophils Relative: 0 %
Eosinophils Absolute: 0.2 10*3/uL (ref 0.0–0.7)
Eosinophils Relative: 2 %
HEMATOCRIT: 49.6 % (ref 39.0–52.0)
HEMOGLOBIN: 15.2 g/dL (ref 13.0–17.0)
IMMATURE GRANULOCYTES: 0 %
LYMPHS ABS: 1.6 10*3/uL (ref 0.7–4.0)
LYMPHS PCT: 23 %
MCH: 30.5 pg (ref 26.0–34.0)
MCHC: 30.6 g/dL (ref 30.0–36.0)
MCV: 99.4 fL (ref 78.0–100.0)
Monocytes Absolute: 0.7 10*3/uL (ref 0.1–1.0)
Monocytes Relative: 9 %
NEUTROS PCT: 66 %
Neutro Abs: 4.5 10*3/uL (ref 1.7–7.7)
Platelets: 198 10*3/uL (ref 150–400)
RBC: 4.99 MIL/uL (ref 4.22–5.81)
RDW: 15.4 % (ref 11.5–15.5)
WBC: 6.9 10*3/uL (ref 4.0–10.5)

## 2017-12-24 LAB — I-STAT CG4 LACTIC ACID, ED: LACTIC ACID, VENOUS: 2.18 mmol/L — AB (ref 0.5–1.9)

## 2017-12-24 NOTE — ED Notes (Signed)
Attempted IV x2. 

## 2017-12-24 NOTE — ED Provider Notes (Signed)
[place] EMERGENCY DEPARTMENT Provider Note   CSN: [id number] Arrival date & time: 12/24/17  1642     History   Chief Complaint Chief Complaint  Patient presents with  . cramping after dialysis  . Back Pain  . Vascular Access Problem    HPI JERRI HARGADON is a 57 y.o. male.  Patient presents to the emergency department with multiple complaints.  Patient reports that for the last 3 days he has been having severe low back pain.  Pain worsens if he tries to move.  He is having trouble standing up and changing positions because of the pain.  Pain does not radiate to the legs, has not noticed weakness or change in sensation.  He does have some radiation of the pain to the left testicle. He denies injury.  Patient is a dialysis patient.  He went to dialysis today.  After leaving dialysis he started having severe pain and cramping of his upper extremities.     Past Medical History:  Diagnosis Date  . Anemia 10/01/2015  . Arthritis    SPINE  . Borderline diabetes mellitus   . BPH (benign prostatic hypertrophy)   . Cancer ([place])   . CKD (chronic kidney disease), stage III ([place])   . Diabetes mellitus without complication (HCC)    borderline on no meds   . Dialysis patient ([place])   . Elevated PSA   . Gout    STABLE  PER PT 10-22-2013  . H/O hiatal hernia   . Hyperlipidemia   . Hypertension   . Microhematuria   . Nocturia   . OSA (obstructive sleep apnea)    PER PT STUDY DONE 2005 (APPROX).  NON- COMPLIANT CPAP  . SVT (supraventricular tachycardia) ([place])   . Wears glasses     Patient Active Problem List   Diagnosis Date Noted  . Symptomatic anemia   . CKD (chronic kidney disease)   . Hemoptysis 10/01/2015  . Anemia 10/01/2015  . Unintentional weight loss 10/01/2015  . OSA (obstructive sleep apnea)   . Baker's cyst of knee   . Joint effusion, knee   . Effusion of right knee 09/21/2014  . Epididymitis 09/21/2014  . Prostate cancer ([place]) 12/18/2013    . Weakness generalized 05/30/2013  . Prolonged QT interval 05/30/2013  . Other and unspecified hyperlipidemia 05/30/2013  . Essential hypertension, benign 05/30/2013  . BPH (benign prostatic hypertrophy) 05/30/2013  . Acute gout 05/30/2013  . CKD (chronic kidney disease) stage 3, GFR 30-59 ml/min ([place]) 05/30/2013    Past Surgical History:  Procedure Laterality Date  . AV FISTULA PLACEMENT Left 08/17/2015   Procedure: BRACHIOCEPHALIC ARTERIOVENOUS (AV) FISTULA CREATION  left arm;  Surgeon: Conrad Branch, MD;  Location: [place];  Service: Vascular;  Laterality: Left;  . 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 CARDIAC CATHETERIZATION  04-07-2002  DR [facility]   NON-CRITICAL CAD/  PRESERVED LV  . CYSTOSCOPY W/ RETROGRADES Bilateral 10/27/2013   Procedure: CYSTOSCOPY WITH RETROGRADE PYELOGRAM;  Surgeon: Molli Hazard, MD;  Location: [facility];  Service: Urology;  Laterality: Bilateral;  . LYMPHADENECTOMY Bilateral 12/18/2013   Procedure: PELVIC LYMPH NODE DISSECTION;  Surgeon: Alexis Frock, MD;  [facility];  Service: Urology;  Laterality: Bilateral;  . PROSTATE BIOPSY N/A 10/27/2013   Procedure: BIOPSY TRANSRECTAL ULTRASONIC PROSTATE (TUBP);  Surgeon: Molli Hazard, MD;  Location: [facility];  Service: Urology;  Laterality: N/A;  . PROSTATE SURGERY    . RIGHT HAND SURGERY  YRS AGO  . ROBOT ASSISTED LAPAROSCOPIC RADICAL PROSTATECTOMY N/A 12/18/2013   Procedure: ROBOTIC ASSISTED LAPAROSCOPIC RADICAL PROSTATECTOMY AND INDOCYANINE GREEN DYE;  Surgeon: Alexis Frock, MD;  [facility];  Service: Urology;  Laterality: N/A;  . TRANSTHORACIC ECHOCARDIOGRAM  07-03-2006   MILD - MODERATE LVH/  EF 55-60%/  GRADE I DIASTOIC DYSFUNCTION/  TRIVIAL  TR /  TRIVIAL PERICARDIAL EFFUSION        Home Medications    Prior to Admission medications   Medication Sig Start  Date End Date Taking? Authorizing Provider  amLODipine (NORVASC) 5 MG tablet Take 5 mg by mouth daily. 08/23/17   [provider]  aspirin EC 81 MG tablet Take 81 mg by mouth daily. 04/16/17   [provider]  atorvastatin (LIPITOR) 40 MG tablet Take 40 mg by mouth every other day. 04/17/17   [provider]  calcitRIOL (ROCALTROL) 0.5 MCG capsule Take 1 capsule (0.5 mcg total) by mouth daily. 10/09/15   Reyne Dumas, MD  calcium acetate (PHOSLO) 667 MG capsule Take 2 capsules (1,334 mg total) by mouth 3 (three) times daily with meals. Patient taking differently: Take 2,668 mg by mouth 3 (three) times daily with meals.  10/09/15   Reyne Dumas, MD  diclofenac sodium (VOLTAREN) 1 % GEL Apply 4 g topically 4 (four) times daily. 08/13/17   Maczis, Barth Kirks, PA-C  LORazepam (ATIVAN) 0.5 MG tablet Take 0.5 mg by mouth at bedtime as needed. 12/31/16   [provider]  multivitamin (RENA-VIT) TABS tablet Take 1 tablet by mouth daily.    [provider]  oxyCODONE-acetaminophen (PERCOCET/ROXICET) 5-325 MG tablet Take 2 tablets by mouth every 4 (four) hours as needed for severe pain. Patient not taking: Reported on 01/31/2017 08/31/16   Nona Dell, PA-C  SENSIPAR 30 MG tablet Take 1 capsule by mouth daily. 12/08/15   [provider]  Vitamin D, Ergocalciferol, (DRISDOL) 50000 units CAPS capsule Take 50,000 Units by mouth 3 (three) times a week.    [provider]    Family History Family History  Problem Relation Age of Onset  . Other Mother        shot and killed  . Cirrhosis Father     Social History Social History   Tobacco Use  . Smoking status: Never Smoker  . Smokeless tobacco: Never Used  Substance Use Topics  . Alcohol use: No  . Drug use: No     Allergies   Penicillins   Review of Systems Review of Systems  Musculoskeletal: Positive for back pain and myalgias.  All other systems reviewed and are  negative.    Physical Exam Updated Vital Signs BP 111/84   Pulse 72   Temp 98.6 F (37 C) (Oral)   Resp 18   Ht 5' 10.5" (1.791 m)   Wt 97.1 kg (214 lb)   SpO2 95%   BMI 30.27 kg/m   Physical Exam  Constitutional: He is oriented to person, place, and time. He appears well-developed and well-nourished. No distress.  HENT:  Head: Normocephalic and atraumatic.  Right Ear: Hearing normal.  Left Ear: Hearing normal.  Nose: Nose normal.  Mouth/Throat: Oropharynx is clear and moist and mucous membranes are normal.  Eyes: Pupils are equal, round, and reactive to light. Conjunctivae and EOM are normal.  Neck: Normal range of motion. Neck supple.  Cardiovascular: Regular  rhythm, S1 normal and S2 normal. Exam reveals no gallop and no friction rub.  No murmur heard. Pulmonary/Chest: Effort normal and breath sounds normal. No respiratory distress. He exhibits no tenderness.  Abdominal: Soft. Normal appearance and bowel sounds are normal. There is no hepatosplenomegaly. There is no tenderness. There is no rebound, no guarding, no tenderness at McBurney's point and negative Murphy's sign. No hernia.  Musculoskeletal: Normal range of motion.       Lumbar back: He exhibits tenderness.  Neurological: He is alert and oriented to person, place, and time. He has normal strength. No cranial nerve deficit or sensory deficit. Coordination normal. GCS eye subscore is 4. GCS verbal subscore is 5. GCS motor subscore is 6.  Skin: Skin is warm, dry and intact. No rash noted. No cyanosis.  Psychiatric: He has a normal mood and affect. His speech is normal and behavior is normal. Thought content normal.  Nursing note and vitals reviewed.    ED Treatments / Results  Labs (all labs ordered are listed, but only abnormal results are displayed) Labs Reviewed  COMPREHENSIVE METABOLIC PANEL - Abnormal; Notable for the following components:      Result Value   Glucose, Bld 118 (*)    Creatinine, Ser 9.22 (*)     Calcium 8.2 (*)    Total Bilirubin 1.4 (*)    GFR calc non Af Amer 6 (*)    GFR calc Af Amer 6 (*)    All other components within normal limits  I-STAT CG4 LACTIC ACID, ED - Abnormal; Notable for the following components:   Lactic Acid, Venous 2.18 (*)    All other components within normal limits  I-STAT CG4 LACTIC ACID, ED - Abnormal; Notable for the following components:   Lactic Acid, Venous 2.23 (*)    All other components within normal limits  CBC WITH DIFFERENTIAL/PLATELET  CK  URINALYSIS, ROUTINE W REFLEX MICROSCOPIC    EKG None  Radiology Mr Lumbar Spine Wo Contrast  Result Date: 12/25/2017 CLINICAL DATA:  Initial evaluation for acute exacerbation of chronic low back pain. EXAM: MRI LUMBAR SPINE WITHOUT CONTRAST TECHNIQUE: Multiplanar, multisequence MR imaging of the lumbar spine was performed. No intravenous contrast was administered. COMPARISON:  Prior radiograph from 02/22/2016. FINDINGS: Segmentation: Normal segmentation. Lowest well-formed disc labeled the L5-S1 level. Alignment: Vertebral bodies normally aligned with preservation of the normal lumbar lordosis. No listhesis. Vertebrae: Vertebral body height maintained without evidence for acute or chronic fracture. Prominent Schmorl's nodes noted at the superior endplate of L4 as well as about the L4-5 interspace. Bone marrow signal intensity diffusely decreased on T1 weighted imaging, suspected to be related to body habitus. 2.2 cm T2/STIR hyperintense lesion within the L1 vertebral body favored to reflect an atypical hemangioma. Additional subcentimeter hemangioma noted within the central aspect of L3. No other worrisome osseous lesions. Conus medullaris and cauda equina: Conus extends to the L1-2 level. Conus and cauda equina appear normal. Paraspinal and other soft tissues: Paraspinous soft tissues demonstrate no acute finding. Disc levels: L1-2:  Mild facet hypertrophy.  No stenosis. L2-3: Diffuse disc bulge with disc  desiccation and intervertebral disc space narrowing. Superimposed shallow right foraminal/extraforaminal disc protrusion with associated annular fissure (series 9, image 4). Protruding disc closely approximates the exiting right L2 nerve root without neural impingement. Mild facet and ligament flavum hypertrophy. Resultant mild spinal stenosis. Foramina remain patent. L3-4: Mild diffuse disc bulge with disc desiccation. Superimposed shallow right foraminal disc protrusion with associated annular fissure contacts the exiting  right L3 nerve root in the right neural foramen (series 9, image 4). Mild to moderate facet and ligament flavum hypertrophy, slightly worse on the right. Associated trace joint effusions. Mild spinal stenosis. Mild right L3 foraminal narrowing. L4-5: Diffuse disc bulge with disc desiccation and intervertebral disc space narrowing. Disc bulging slightly asymmetric to the left. Mild to moderate facet and ligament flavum hypertrophy. Resultant mild spinal stenosis. Moderate left L4 foraminal narrowing. L5-S1: Disc bulge with disc desiccation and intervertebral disc space narrowing. Superimposed shallow right subarticular disc protrusion with associated annular fissure. Protruding disc closely approximates the descending right S1 nerve root without frank neural impingement or displacement (series 12, image 26). Mild facet hypertrophy. No significant stenosis. IMPRESSION: 1. Shallow right foraminal/extraforaminal disc protrusions at L2-3 and L3-4, closely approximating and potentially irritating the exiting right L2 and L3 nerve roots respectively. 2. Disc bulge with facet hypertrophy at L4-5 with resultant moderate left L4 foraminal stenosis. 3. Small right subarticular disc protrusion at L5-S1, closely approximating and potentially affecting the descending right S1 nerve root. Electronically Signed   By: Jeannine Boga M.D.   On: 12/25/2017 06:12    Procedures Procedures (including  critical care time)  Medications Ordered in ED Medications  HYDROmorphone (DILAUDID) injection 1 mg (1 mg Intramuscular Given 12/25/17 0247)     Initial Impression / Assessment and Plan / ED Course  I have reviewed the triage vital signs and the nursing notes.  Pertinent labs & imaging results that were available during my care of the patient were reviewed by me and considered in my medical decision making (see chart for details).     Patient presents to the emergency department for evaluation of cramping of his upper extremity as well as low back pain.  These complaints appear to be separate planes.  His low back pain started several days ago and has progressively worsened.  He reports that the pain severely worsens if he tries to stand and walk.  He does not have any neurologic deficit.  Patient is a dialysis patient, however, is at risk for blood-borne infection.  He therefore underwent MRI to rule out infection as well as other pathology causing his back pain.  He does have evidence of disc protrusion that is likely causing his pain, no infection noted.  Remainder patient's blood work is unremarkable.  He does have some mild hypocalcemia, likely causing some of the cramping he was experiencing previously.  This is related to his chronic renal disease.  He is on calcitriol.  Patient administered analgesia, will be discharged with continued allergies and follow-up with PCP.  Final Clinical Impressions(s) / ED Diagnoses   Final diagnoses:  Muscle cramps  Acute midline low back pain without sciatica    ED Discharge Orders    None       Orpah Greek, MD 12/25/17 [phone]

## 2017-12-24 NOTE — ED Triage Notes (Signed)
Pt endorses having cramps in hands and arms after dialysis today and has back pain. VSS.

## 2017-12-24 NOTE — ED Notes (Signed)
Mandy at [facility] notified of elevated CG-4

## 2017-12-25 ENCOUNTER — Emergency Department (HOSPITAL_COMMUNITY): Payer: 59

## 2017-12-25 LAB — I-STAT CG4 LACTIC ACID, ED: Lactic Acid, Venous: 2.23 mmol/L (ref 0.5–1.9)

## 2017-12-25 LAB — CK: CK TOTAL: 105 U/L (ref 49–397)

## 2017-12-25 LAB — CBG MONITORING, ED: GLUCOSE-CAPILLARY: 87 mg/dL (ref 70–99)

## 2017-12-25 MED ORDER — MECLIZINE HCL 25 MG PO TABS
25.0000 mg | ORAL_TABLET | Freq: Once | ORAL | Status: AC
  Administered 2017-12-25: 25 mg via ORAL
  Filled 2017-12-25: qty 1

## 2017-12-25 MED ORDER — OXYCODONE-ACETAMINOPHEN 5-325 MG PO TABS: 1.0000 | ORAL_TABLET | ORAL | 0 refills | Status: DC | PRN

## 2017-12-25 MED ORDER — PREDNISONE 10 MG (21) PO TBPK: ORAL_TABLET | Freq: Every day | ORAL | 0 refills | Status: DC

## 2017-12-25 MED ORDER — HYDROMORPHONE HCL 1 MG/ML IJ SOLN
1.0000 mg | Freq: Once | INTRAMUSCULAR | Status: DC
  Filled 2017-12-25: qty 1

## 2017-12-25 MED ORDER — HYDROMORPHONE HCL 1 MG/ML IJ SOLN
1.0000 mg | Freq: Once | INTRAMUSCULAR | Status: AC
  Administered 2017-12-25: 1 mg via INTRAMUSCULAR

## 2017-12-25 NOTE — ED Notes (Signed)
Lactic results given to Dr. Betsey Holiday

## 2017-12-25 NOTE — ED Notes (Signed)
Pt still endorsing dizziness at this time. Vitals stable. PA spoke to patient. We will give him a dose of meclizine.

## 2017-12-25 NOTE — ED Provider Notes (Signed)
Pt seen by Dr. Betsey Holiday for back pain, discharged home. As he was being discharge patient became dizzy, states that he felt like everything was spinning and was unable to walk.  He thought that it was due to not eating anything since yesterday afternoon.  He did also get Dilaudid IM earlier and states that may be that on empty stomach made him sick.  Patient was given several crackers, which did not make a difference.  He states he still hungry.  I will give him a sandwich.  Also ordered a dose of meclizine.   9:40 AM Patient ate sandwich, drink some fluids.  He also received 25 mg of meclizine.  He is feeling much better.  He states he has no dizziness and would like to go home.  Will discharge home with close outpatient follow-up. VS normal.   Vitals:   12/25/17 0700 12/25/17 0803 12/25/17 0907 12/25/17 0914  BP: 128/90 124/88 119/85   Pulse: 73 67 71   Resp: 18 16 16    Temp:    98.6 F (37 C)  TempSrc:      SpO2: 98% 100% 100%   Weight:      Height:          Jeannett Senior, PA-C 12/25/17 0940    Sherwood Gambler, MD 12/26/17 1656

## 2017-12-25 NOTE — ED Notes (Signed)
Pt up into wheelchair. Endorses some dizziness but states he is fine and just feels tired and maybe hungry. Once into the hallway patient begins dry heaving. Pt brought back to room and cbg checked. CBG 87. Pt has been here 15hrs without eating. Pt given graham crackers and orange juice. EDP who treated patient is now off shift. Spoke to [place] who will see the patient prior to discharging him.

## 2017-12-25 NOTE — ED Notes (Signed)
Pt has eaten graham crackers and drank orange juice, states he is feeling better and dizziness has subsided.

## 2017-12-25 NOTE — ED Notes (Signed)
Attempted in and out cath with no urine returned.

## 2017-12-25 NOTE — ED Notes (Signed)
Pt resting on stretcher with eyes closed, RR even and unlabored, NAD 

## 2018-01-11 ENCOUNTER — Other Ambulatory Visit (HOSPITAL_COMMUNITY): Payer: Self-pay | Admitting: Nephrology

## 2018-01-11 DIAGNOSIS — R319 Hematuria, unspecified: Secondary | ICD-10-CM

## 2018-01-15 ENCOUNTER — Ambulatory Visit (HOSPITAL_COMMUNITY)
Admission: RE | Admit: 2018-01-15 | Discharge: 2018-01-15 | Disposition: A | Discharge status: Home / Self Care | Payer: 59 | Source: Ambulatory Visit | Attending: Nephrology | Admitting: Nephrology

## 2018-01-15 DIAGNOSIS — N2889 Other specified disorders of kidney and ureter: Secondary | ICD-10-CM | POA: Diagnosis not present

## 2018-01-15 DIAGNOSIS — R319 Hematuria, unspecified: Secondary | ICD-10-CM | POA: Insufficient documentation

## 2018-03-30 ENCOUNTER — Emergency Department (HOSPITAL_COMMUNITY)
Admission: EM | Admit: 2018-03-30 | Discharge: 2018-03-30 | Disposition: A | Discharge status: Home / Self Care | Payer: 59 | Attending: Emergency Medicine | Admitting: Emergency Medicine

## 2018-03-30 ENCOUNTER — Encounter (HOSPITAL_COMMUNITY): Payer: Self-pay | Admitting: *Deleted

## 2018-03-30 DIAGNOSIS — T5891XA Toxic effect of carbon monoxide from unspecified source, accidental (unintentional), initial encounter: Secondary | ICD-10-CM | POA: Diagnosis not present

## 2018-03-30 DIAGNOSIS — N186 End stage renal disease: Secondary | ICD-10-CM | POA: Insufficient documentation

## 2018-03-30 DIAGNOSIS — Z8546 Personal history of malignant neoplasm of prostate: Secondary | ICD-10-CM | POA: Diagnosis not present

## 2018-03-30 DIAGNOSIS — Z79899 Other long term (current) drug therapy: Secondary | ICD-10-CM | POA: Diagnosis not present

## 2018-03-30 DIAGNOSIS — Z992 Dependence on renal dialysis: Secondary | ICD-10-CM | POA: Diagnosis not present

## 2018-03-30 DIAGNOSIS — Z7729 Contact with and (suspected ) exposure to other hazardous substances: Secondary | ICD-10-CM

## 2018-03-30 DIAGNOSIS — R51 Headache: Secondary | ICD-10-CM | POA: Diagnosis present

## 2018-03-30 DIAGNOSIS — E119 Type 2 diabetes mellitus without complications: Secondary | ICD-10-CM | POA: Insufficient documentation

## 2018-03-30 DIAGNOSIS — I120 Hypertensive chronic kidney disease with stage 5 chronic kidney disease or end stage renal disease: Secondary | ICD-10-CM | POA: Insufficient documentation

## 2018-03-30 LAB — BASIC METABOLIC PANEL
ANION GAP: 16 — AB (ref 5–15)
BUN: 24 mg/dL — ABNORMAL HIGH (ref 6–20)
CO2: 31 mmol/L (ref 22–32)
Calcium: 8.8 mg/dL — ABNORMAL LOW (ref 8.9–10.3)
Chloride: 97 mmol/L — ABNORMAL LOW (ref 98–111)
Creatinine, Ser: 9.66 mg/dL — ABNORMAL HIGH (ref 0.61–1.24)
GFR, EST AFRICAN AMERICAN: 6 mL/min — AB (ref >60)
GFR, EST NON AFRICAN AMERICAN: 5 mL/min — AB (ref >60)
Glucose, Bld: 90 mg/dL (ref 70–99)
POTASSIUM: 3.6 mmol/L (ref 3.5–5.1)
SODIUM: 144 mmol/L (ref 135–145)

## 2018-03-30 LAB — CBC WITH DIFFERENTIAL/PLATELET
Abs Immature Granulocytes: 0.02 10*3/uL (ref 0.00–0.07)
Basophils Absolute: 0 10*3/uL (ref 0.0–0.1)
Basophils Relative: 1 %
EOS PCT: 4 %
Eosinophils Absolute: 0.2 10*3/uL (ref 0.0–0.5)
HEMATOCRIT: 44.9 % (ref 39.0–52.0)
HEMOGLOBIN: 13.6 g/dL (ref 13.0–17.0)
Immature Granulocytes: 0 %
LYMPHS PCT: 33 %
Lymphs Abs: 1.9 10*3/uL (ref 0.7–4.0)
MCH: 29.4 pg (ref 26.0–34.0)
MCHC: 30.3 g/dL (ref 30.0–36.0)
MCV: 97.2 fL (ref 80.0–100.0)
MONO ABS: 0.7 10*3/uL (ref 0.1–1.0)
MONOS PCT: 12 %
Neutro Abs: 2.8 10*3/uL (ref 1.7–7.7)
Neutrophils Relative %: 50 %
Platelets: 152 10*3/uL (ref 150–400)
RBC: 4.62 MIL/uL (ref 4.22–5.81)
RDW: 14.8 % (ref 11.5–15.5)
WBC: 5.6 10*3/uL (ref 4.0–10.5)
nRBC: 0 % (ref 0.0–0.2)

## 2018-03-30 LAB — COOXEMETRY PANEL
Carboxyhemoglobin: 2.5 % — ABNORMAL HIGH (ref 0.5–1.5)
METHEMOGLOBIN: 0.9 % (ref 0.0–1.5)
O2 Saturation: 63 %
Total hemoglobin: 14 g/dL (ref 12.0–16.0)

## 2018-03-30 LAB — BLOOD GAS, VENOUS
Acid-Base Excess: 8.1 mmol/L — ABNORMAL HIGH (ref 0.0–2.0)
Bicarbonate: 34.2 mmol/L — ABNORMAL HIGH (ref 20.0–28.0)
O2 CONTENT: 15 L/min
O2 SAT: 61.2 %
Patient temperature: 98.6
pCO2, Ven: 55.1 mmHg (ref 44.0–60.0)
pH, Ven: 7.41 (ref 7.250–7.430)
pO2, Ven: 34 mmHg (ref 32.0–45.0)

## 2018-03-30 NOTE — ED Triage Notes (Signed)
Pt states his carbon monoxide alarm went off this morning. Pt has headache and dizziness since this morning. Pt states his daughter turned on the heat but the pilot light was not on. Pt is on dialysis.

## 2018-03-30 NOTE — ED Provider Notes (Signed)
[facility] DEPT Provider Note   CSN: [id number] Arrival date & time: 03/30/18  0741     History   Chief Complaint Chief Complaint  Patient presents with  . Headache  . Dizziness  . Toxic Inhalation    HPI ANGELES PAOLUCCI is a 57 y.o. male.  HPI  57 year old male with a history of diabetes, ESRD on dialysis, hypertension, hyperlipidemia presents with concern for possible carbon monoxide exposure.  Patient reports that the alarm was going off at the home this morning, after his daughter had turned on the heat last night.  Reports he has a headache, which is dull, rated 5-6 out of 10.  Notes mild dizziness.  Denies numbness, weakness, visual changes, chest pain, shortness of breath, syncope or other concerns.  Past Medical History:  Diagnosis Date  . Anemia 10/01/2015  . Arthritis    SPINE  . Borderline diabetes mellitus   . BPH (benign prostatic hypertrophy)   . Cancer ([place])   . CKD (chronic kidney disease), stage III ([place])   . Diabetes mellitus without complication (HCC)    borderline on no meds   . Dialysis patient ([place])   . Elevated PSA   . Gout    STABLE  PER PT 10-22-2013  . H/O hiatal hernia   . Hyperlipidemia   . Hypertension   . Microhematuria   . Nocturia   . OSA (obstructive sleep apnea)    PER PT STUDY DONE 2005 (APPROX).  NON- COMPLIANT CPAP  . SVT (supraventricular tachycardia) ([place])   . Wears glasses     Patient Active Problem List   Diagnosis Date Noted  . Symptomatic anemia   . CKD (chronic kidney disease)   . Hemoptysis 10/01/2015  . Anemia 10/01/2015  . Unintentional weight loss 10/01/2015  . OSA (obstructive sleep apnea)   . Baker's cyst of knee   . Joint effusion, knee   . Effusion of right knee 09/21/2014  . Epididymitis 09/21/2014  . Prostate cancer ([place]) 12/18/2013  . Weakness generalized 05/30/2013  . Prolonged QT interval 05/30/2013  . Other and unspecified hyperlipidemia 05/30/2013  . Essential  hypertension, benign 05/30/2013  . BPH (benign prostatic hypertrophy) 05/30/2013  . Acute gout 05/30/2013  . CKD (chronic kidney disease) stage 3, GFR 30-59 ml/min ([place]) 05/30/2013    Past Surgical History:  Procedure Laterality Date  . AV FISTULA PLACEMENT Left 08/17/2015   Procedure: BRACHIOCEPHALIC ARTERIOVENOUS (AV) FISTULA CREATION  left arm;  Surgeon: Conrad , MD;  Location: [facility];  Service: Vascular;  Laterality: Left;  . 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 CARDIAC CATHETERIZATION  04-07-2002  DR [facility]   NON-CRITICAL CAD/  PRESERVED LV  . CYSTOSCOPY W/ RETROGRADES Bilateral 10/27/2013   Procedure: CYSTOSCOPY WITH RETROGRADE PYELOGRAM;  Surgeon: Molli Hazard, MD;  [facility];  Service: Urology;  Laterality: Bilateral;  . LYMPHADENECTOMY Bilateral 12/18/2013   Procedure: PELVIC LYMPH NODE DISSECTION;  Surgeon: Alexis Frock, MD;  Location: [facility];  Service: Urology;  Laterality: Bilateral;  . PROSTATE BIOPSY N/A 10/27/2013   Procedure: BIOPSY TRANSRECTAL ULTRASONIC PROSTATE (TUBP);  Surgeon: Molli Hazard, MD;  Location: [facility];  Service: Urology;  Laterality: N/A;  . PROSTATE SURGERY    . RIGHT HAND SURGERY  YRS AGO  . ROBOT ASSISTED LAPAROSCOPIC RADICAL PROSTATECTOMY N/A 12/18/2013   Procedure: ROBOTIC ASSISTED LAPAROSCOPIC  RADICAL PROSTATECTOMY AND INDOCYANINE GREEN DYE;  Surgeon: Alexis Frock, MD;  Location[facility];  Service: Urology;  Laterality: N/A;  . TRANSTHORACIC ECHOCARDIOGRAM  07-03-2006   MILD - MODERATE LVH/  EF 55-60%/  GRADE I DIASTOIC DYSFUNCTION/  TRIVIAL  TR /  TRIVIAL PERICARDIAL EFFUSION        Home Medications    Prior to Admission medications   Medication Sig Start Date End Date Taking? Authorizing Provider  amLODipine (NORVASC) 5 MG tablet Take 5 mg by mouth daily. 08/23/17  Yes [provider]    atorvastatin (LIPITOR) 40 MG tablet Take 40 mg by mouth every other day. 04/17/17  Yes [provider]  AURYXIA 1 GM 210 MG(Fe) tablet Take 840 mg by mouth 3 (three) times daily as needed for indigestion. 02/24/18  Yes [provider]  calcitRIOL (ROCALTROL) 0.5 MCG capsule Take 1 capsule (0.5 mcg total) by mouth daily. 10/09/15  Yes Reyne Dumas, MD  calcium acetate (PHOSLO) 667 MG capsule Take 2 capsules (1,334 mg total) by mouth 3 (three) times daily with meals. Patient taking differently: Take 2,668 mg by mouth 3 (three) times daily with meals.  10/09/15  Yes Reyne Dumas, MD  cinacalcet (SENSIPAR) 60 MG tablet Take 60 mg by mouth daily with supper. 03/15/18  Yes [provider]  LORazepam (ATIVAN) 0.5 MG tablet Take 0.5 mg by mouth at bedtime as needed for anxiety or sleep.  12/31/16  Yes [provider]  multivitamin (RENA-VIT) TABS tablet Take 1 tablet by mouth daily.   Yes [provider]  multivitamin (RENA-VIT) TABS tablet Take 1 tablet by mouth daily.   Yes [provider]  sildenafil (VIAGRA) 50 MG tablet Take 50 mg by mouth daily as needed for erectile dysfunction. 01/25/18  Yes [provider]  triamcinolone cream (KENALOG) 0.1 % Apply 1 application topically daily as needed (itchy skin).  03/20/18  Yes [provider]  Vitamin D, Ergocalciferol, (DRISDOL) 50000 units CAPS capsule Take 100,000 Units by mouth 3 (three) times a week.    Yes [provider]  oxyCODONE-acetaminophen (PERCOCET) 5-325 MG tablet Take 1 tablet by mouth every 4 (four) hours as needed. Patient not taking: Reported on 03/30/2018 12/25/17   Orpah Greek, MD  predniSONE (STERAPRED UNI-PAK 21 TAB) 10 MG (21) TBPK tablet Take by mouth daily. As directed Patient not taking: Reported on 03/30/2018 12/25/17   Orpah Greek, MD    Family History Family History  Problem Relation Age of Onset  . Other Mother        shot and  killed  . Cirrhosis Father     Social History Social History   Tobacco Use  . Smoking status: Never Smoker  . Smokeless tobacco: Never Used  Substance Use Topics  . Alcohol use: No  . Drug use: No     Allergies   Penicillins   Review of Systems Review of Systems  Constitutional: Negative for fever.  Eyes: Negative for visual disturbance.  Respiratory: Negative for shortness of breath.   Cardiovascular: Negative for chest pain.  Gastrointestinal: Negative for abdominal pain.  Skin: Negative for rash.  Neurological: Positive for dizziness and headaches. Negative for syncope, speech difficulty, weakness and numbness.     Physical Exam Updated Vital Signs BP (!) 137/96 (BP Location: Right Arm)   Pulse 65   Temp 98.3 F (36.8 C) (Oral)   Resp 18   SpO2 100%   Physical Exam  Constitutional: He is oriented to  person, place, and time. He appears well-developed and well-nourished. No distress.  HENT:  Head: Normocephalic and atraumatic.  Eyes: Conjunctivae and EOM are normal.  Neck: Normal range of motion.  Cardiovascular: Normal rate, regular rhythm, normal heart sounds and intact distal pulses. Exam reveals no gallop and no friction rub.  No murmur heard. Pulmonary/Chest: Effort normal and breath sounds normal. No respiratory distress. He has no wheezes. He has no rales.  Abdominal: Soft. He exhibits no distension. There is no tenderness. There is no guarding.  Musculoskeletal: He exhibits no edema.  Neurological: He is alert and oriented to person, place, and time.  Skin: Skin is warm and dry. He is not diaphoretic.  Nursing note and vitals reviewed.    ED Treatments / Results  Labs (all labs ordered are listed, but only abnormal results are displayed) Labs Reviewed  COOXEMETRY PANEL - Abnormal; Notable for the following components:      Result Value   Carboxyhemoglobin 2.5 (*)    All other components within normal limits  BASIC METABOLIC PANEL - Abnormal;  Notable for the following components:   Chloride 97 (*)    BUN 24 (*)    Creatinine, Ser 9.66 (*)    Calcium 8.8 (*)    GFR calc non Af Amer 5 (*)    GFR calc Af Amer 6 (*)    Anion gap 16 (*)    All other components within normal limits  BLOOD GAS, VENOUS - Abnormal; Notable for the following components:   Bicarbonate 34.2 (*)    Acid-Base Excess 8.1 (*)    All other components within normal limits  CBC WITH DIFFERENTIAL/PLATELET    EKG None  Radiology No results found.  Procedures Procedures (including critical care time)  Medications Ordered in ED Medications - No data to display   Initial Impression / Assessment and Plan / ED Course  I have reviewed the triage vital signs and the nursing notes.  Pertinent labs & imaging results that were available during my care of the patient were reviewed by me and considered in my medical decision making (see chart for details).     57 year old male with a history of diabetes, ESRD on dialysis, hypertension, hyperlipidemia presents with concern for possible carbon monoxide exposure.  Placed on NRB on arrival. Coox shows carboxyhemoglobin of 2.5. Other labs without significant findings. No sign of CO poisoning. No sign of emergent etiology of headache.  Recommend PCP follow up.  Final Clinical Impressions(s) / ED Diagnoses   Final diagnoses:  Carbon monoxide exposure    ED Discharge Orders    None       Gareth Morgan, MD 03/30/18 0930

## 2018-03-30 NOTE — ED Notes (Signed)
Pt placed on non-rebreather per MD verbal order for tx of CO exposure.

## 2018-04-24 ENCOUNTER — Telehealth (INDEPENDENT_AMBULATORY_CARE_PROVIDER_SITE_OTHER): Payer: Self-pay | Admitting: Specialist

## 2018-04-24 NOTE — Telephone Encounter (Signed)
Please add patient to cancellation list in new patient slot, has been a while since he has seen Dr. Louanne Skye for back pain but he is needing an appointment as soon as possible. Patients # [phone]

## 2018-04-25 NOTE — Telephone Encounter (Signed)
I put him on the cancellation list 

## 2018-04-26 ENCOUNTER — Ambulatory Visit (INDEPENDENT_AMBULATORY_CARE_PROVIDER_SITE_OTHER): Payer: Self-pay | Admitting: Specialist

## 2018-04-29 ENCOUNTER — Encounter (HOSPITAL_COMMUNITY): Payer: Self-pay | Admitting: Emergency Medicine

## 2018-04-29 ENCOUNTER — Emergency Department (HOSPITAL_COMMUNITY)
Admission: EM | Admit: 2018-04-29 | Discharge: 2018-04-29 | Disposition: A | Discharge status: Home / Self Care | Payer: 59 | Attending: Emergency Medicine | Admitting: Emergency Medicine

## 2018-04-29 DIAGNOSIS — I129 Hypertensive chronic kidney disease with stage 1 through stage 4 chronic kidney disease, or unspecified chronic kidney disease: Secondary | ICD-10-CM | POA: Diagnosis not present

## 2018-04-29 DIAGNOSIS — M62838 Other muscle spasm: Secondary | ICD-10-CM | POA: Insufficient documentation

## 2018-04-29 DIAGNOSIS — R252 Cramp and spasm: Secondary | ICD-10-CM

## 2018-04-29 DIAGNOSIS — N183 Chronic kidney disease, stage 3 (moderate): Secondary | ICD-10-CM | POA: Diagnosis not present

## 2018-04-29 DIAGNOSIS — M25542 Pain in joints of left hand: Secondary | ICD-10-CM | POA: Diagnosis present

## 2018-04-29 DIAGNOSIS — Z79899 Other long term (current) drug therapy: Secondary | ICD-10-CM | POA: Insufficient documentation

## 2018-04-29 LAB — CBC WITH DIFFERENTIAL/PLATELET
ABS IMMATURE GRANULOCYTES: 0.02 10*3/uL (ref 0.00–0.07)
BASOS ABS: 0 10*3/uL (ref 0.0–0.1)
Basophils Relative: 1 %
Eosinophils Absolute: 0.2 10*3/uL (ref 0.0–0.5)
Eosinophils Relative: 3 %
HEMATOCRIT: 45.6 % (ref 39.0–52.0)
Hemoglobin: 14.4 g/dL (ref 13.0–17.0)
IMMATURE GRANULOCYTES: 0 %
Lymphocytes Relative: 26 %
Lymphs Abs: 1.5 10*3/uL (ref 0.7–4.0)
MCH: 29.7 pg (ref 26.0–34.0)
MCHC: 31.6 g/dL (ref 30.0–36.0)
MCV: 94 fL (ref 80.0–100.0)
MONO ABS: 0.6 10*3/uL (ref 0.1–1.0)
MONOS PCT: 11 %
Neutro Abs: 3.4 10*3/uL (ref 1.7–7.7)
Neutrophils Relative %: 59 %
Platelets: 182 10*3/uL (ref 150–400)
RBC: 4.85 MIL/uL (ref 4.22–5.81)
RDW: 15 % (ref 11.5–15.5)
WBC: 5.7 10*3/uL (ref 4.0–10.5)
nRBC: 0 % (ref 0.0–0.2)

## 2018-04-29 LAB — BASIC METABOLIC PANEL
Anion gap: 13 (ref 5–15)
BUN: 22 mg/dL — AB (ref 6–20)
CO2: 29 mmol/L (ref 22–32)
CREATININE: 8.26 mg/dL — AB (ref 0.61–1.24)
Calcium: 8.5 mg/dL — ABNORMAL LOW (ref 8.9–10.3)
Chloride: 97 mmol/L — ABNORMAL LOW (ref 98–111)
GFR calc Af Amer: 7 mL/min — ABNORMAL LOW (ref >60)
GFR, EST NON AFRICAN AMERICAN: 6 mL/min — AB (ref >60)
Glucose, Bld: 129 mg/dL — ABNORMAL HIGH (ref 70–99)
Potassium: 3 mmol/L — ABNORMAL LOW (ref 3.5–5.1)
SODIUM: 139 mmol/L (ref 135–145)

## 2018-04-29 LAB — MAGNESIUM: MAGNESIUM: 2.3 mg/dL (ref 1.7–2.4)

## 2018-04-29 MED ORDER — ACETAMINOPHEN 325 MG PO TABS
325.0000 mg | ORAL_TABLET | Freq: Once | ORAL | Status: AC
  Administered 2018-04-29: 325 mg via ORAL
  Filled 2018-04-29: qty 1

## 2018-04-29 MED ORDER — SODIUM CHLORIDE 0.9 % IV BOLUS: 500.0000 mL | Freq: Once | INTRAVENOUS | Status: DC

## 2018-04-29 NOTE — Discharge Instructions (Addendum)
Follow up with your primary care provider.  Hold your Norvasc for the next 2 days.

## 2018-04-29 NOTE — ED Notes (Signed)
Patient left at this time with all belongings. 

## 2018-04-29 NOTE — ED Triage Notes (Signed)
Pt arrives after dialysis this morning, approx 1 hour ago he started having bilateral hand pain and tingling radiating up to the arms.

## 2018-04-29 NOTE — ED Provider Notes (Signed)
[facility] EMERGENCY DEPARTMENT Provider Note   CSN: [id number] Arrival date & time: 04/29/18  1208     History   Chief Complaint Chief Complaint  Patient presents with  . Hand Pain    bilateral hand tingling/pain since dialysis     HPI RUSHI CHASEN is a 57 y.o. male.  57 year old male presents with complaint of cramping in both of his hands.  Patient states his pain started after completing dialysis today.  Patient denies any cramping in his legs or forearms.  Patient states this happened one time previously several years ago, improved with IV fluids.  Patient attends dialysis Monday, Wednesday, Friday.  Denies any other complaints or concerns.     Past Medical History:  Diagnosis Date  . Anemia 10/01/2015  . Arthritis    SPINE  . Borderline diabetes mellitus   . BPH (benign prostatic hypertrophy)   . Cancer ([place])   . CKD (chronic kidney disease), stage III ([place])   . Diabetes mellitus without complication (HCC)    borderline on no meds   . Dialysis patient ([place])   . Elevated PSA   . Gout    STABLE  PER PT 10-22-2013  . H/O hiatal hernia   . Hyperlipidemia   . Hypertension   . Microhematuria   . Nocturia   . OSA (obstructive sleep apnea)    PER PT STUDY DONE 2005 (APPROX).  NON- COMPLIANT CPAP  . SVT (supraventricular tachycardia) ([place])   . Wears glasses     Patient Active Problem List   Diagnosis Date Noted  . Symptomatic anemia   . CKD (chronic kidney disease)   . Hemoptysis 10/01/2015  . Anemia 10/01/2015  . Unintentional weight loss 10/01/2015  . OSA (obstructive sleep apnea)   . Baker's cyst of knee   . Joint effusion, knee   . Effusion of right knee 09/21/2014  . Epididymitis 09/21/2014  . Prostate cancer ([place]) 12/18/2013  . Weakness generalized 05/30/2013  . Prolonged QT interval 05/30/2013  . Other and unspecified hyperlipidemia 05/30/2013  . Essential hypertension, benign 05/30/2013  . BPH (benign prostatic hypertrophy)  05/30/2013  . Acute gout 05/30/2013  . CKD (chronic kidney disease) stage 3, GFR 30-59 ml/min ([place]) 05/30/2013    Past Surgical History:  Procedure Laterality Date  . AV FISTULA PLACEMENT Left 08/17/2015   Procedure: BRACHIOCEPHALIC ARTERIOVENOUS (AV) FISTULA CREATION  left arm;  Surgeon: Conrad Pickerington, MD;  Location: [facility];  Service: Vascular;  Laterality: Left;  . 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 CARDIAC CATHETERIZATION  04-07-2002  DR [facility]   NON-CRITICAL CAD/  PRESERVED LV  . CYSTOSCOPY W/ RETROGRADES Bilateral 10/27/2013   Procedure: CYSTOSCOPY WITH RETROGRADE PYELOGRAM;  Surgeon: Molli Hazard, MD;  Location: [facility];  Service: Urology;  Laterality: Bilateral;  . LYMPHADENECTOMY Bilateral 12/18/2013   Procedure: PELVIC LYMPH NODE DISSECTION;  Surgeon: Alexis Frock, MD;  [facility];  Service: Urology;  Laterality: Bilateral;  . PROSTATE BIOPSY N/A 10/27/2013   Procedure: BIOPSY TRANSRECTAL ULTRASONIC PROSTATE (TUBP);  Surgeon: Molli Hazard, MD;  Location: [facility];  Service: Urology;  Laterality: N/A;  . PROSTATE SURGERY    . RIGHT HAND SURGERY  YRS AGO  . ROBOT ASSISTED LAPAROSCOPIC RADICAL PROSTATECTOMY N/A 12/18/2013   Procedure: ROBOTIC ASSISTED LAPAROSCOPIC RADICAL PROSTATECTOMY AND INDOCYANINE GREEN DYE;  Surgeon: Alexis Frock, MD;  [facility];  Service: Urology;  Laterality: N/A;  . TRANSTHORACIC ECHOCARDIOGRAM  07-03-2006   MILD - MODERATE LVH/  EF 55-60%/  GRADE I DIASTOIC DYSFUNCTION/  TRIVIAL  TR /  TRIVIAL PERICARDIAL EFFUSION        Home Medications    Prior to Admission medications   Medication Sig Start Date End Date Taking? Authorizing Provider  amLODipine (NORVASC) 5 MG tablet Take 5 mg by mouth as needed.  08/23/17  Yes [provider]  calcium acetate (PHOSLO) 667 MG capsule Take 2 capsules (1,334 mg  total) by mouth 3 (three) times daily with meals. Patient taking differently: Take 2,668 mg by mouth 3 (three) times daily with meals.  10/09/15  Yes Reyne Dumas, MD  cinacalcet (SENSIPAR) 60 MG tablet Take 60 mg by mouth daily with supper. 03/15/18  Yes [provider]  LORazepam (ATIVAN) 0.5 MG tablet Take 0.5 mg by mouth at bedtime as needed for anxiety or sleep.  12/31/16  Yes [provider]  multivitamin (RENA-VIT) TABS tablet Take 1 tablet by mouth daily.   Yes [provider]  sildenafil (VIAGRA) 50 MG tablet Take 50 mg by mouth daily as needed for erectile dysfunction. 01/25/18  Yes [provider]  triamcinolone cream (KENALOG) 0.1 % Apply 1 application topically daily as needed (itchy skin).  03/20/18  Yes [provider]  Vitamin D, Ergocalciferol, (DRISDOL) 50000 units CAPS capsule Take 100,000 Units by mouth 3 (three) times a week.    Yes [provider]  calcitRIOL (ROCALTROL) 0.5 MCG capsule Take 1 capsule (0.5 mcg total) by mouth daily. Patient not taking: Reported on 04/29/2018 10/09/15   Reyne Dumas, MD  oxyCODONE-acetaminophen (PERCOCET) 5-325 MG tablet Take 1 tablet by mouth every 4 (four) hours as needed. Patient not taking: Reported on 03/30/2018 12/25/17   Orpah Greek, MD  predniSONE (STERAPRED UNI-PAK 21 TAB) 10 MG (21) TBPK tablet Take by mouth daily. As directed Patient not taking: Reported on 03/30/2018 12/25/17   Orpah Greek, MD    Family History Family History  Problem Relation Age of Onset  . Other Mother        shot and killed  . Cirrhosis Father     Social History Social History   Tobacco Use  . Smoking status: Never Smoker  . Smokeless tobacco: Never Used  Substance Use Topics  . Alcohol use: No  . Drug use: No     Allergies   Penicillins   Review of Systems Review of Systems  Constitutional: Negative for chills and fever.  Respiratory: Negative for shortness of breath.     Cardiovascular: Negative for chest pain.  Musculoskeletal: Positive for arthralgias and myalgias.  Skin: Negative for rash and wound.  Allergic/Immunologic: Positive for immunocompromised state.  Neurological: Negative for dizziness, weakness and numbness.  Psychiatric/Behavioral: Negative for confusion.  All other systems reviewed and are negative.    Physical Exam Updated Vital Signs BP (!) 98/55   Pulse 83   Temp (!) 97.3 F (36.3 C) (Oral)   Resp 16   Ht 5\' 11"  (1.803 m)   Wt 97.1 kg   SpO2 100%   BMI 29.85 kg/m   Physical Exam  Constitutional: He is oriented to person, place, and time. He appears well-developed and well-nourished. No distress.  Appears anxious/uncomfortable   HENT:  Head: Normocephalic and atraumatic.  Cardiovascular: Normal rate, regular rhythm, normal heart sounds and intact distal pulses.  No murmur heard. Pulmonary/Chest: Effort normal and  breath sounds normal. No respiratory distress.  Musculoskeletal: He exhibits no tenderness or deformity.  Visible cramping bilateral fingers/hands  Neurological: He is alert and oriented to person, place, and time. No sensory deficit.  Skin: Skin is warm and dry. No rash noted. He is not diaphoretic.  Psychiatric: He has a normal mood and affect. His behavior is normal.  Nursing note and vitals reviewed.    ED Treatments / Results  Labs (all labs ordered are listed, but only abnormal results are displayed) Labs Reviewed  BASIC METABOLIC PANEL - Abnormal; Notable for the following components:      Result Value   Potassium 3.0 (*)    Chloride 97 (*)    Glucose, Bld 129 (*)    BUN 22 (*)    Creatinine, Ser 8.26 (*)    Calcium 8.5 (*)    GFR calc non Af Amer 6 (*)    GFR calc Af Amer 7 (*)    All other components within normal limits  CBC WITH DIFFERENTIAL/PLATELET  MAGNESIUM    EKG EKG Interpretation  Date/Time:  Monday April 29 2018 12:19:32 EST Ventricular Rate:  92 PR Interval:    QRS  Duration: 98 QT Interval:  396 QTC Calculation: 490 R Axis:   18 Text Interpretation:  Sinus rhythm Left atrial enlargement Abnormal R-wave progression, early transition Borderline T abnormalities, anterior leads Borderline prolonged QT interval Confirmed by Quintella Reichert [phone]) on 04/29/2018 1:51:30 PM   Radiology No results found.  Procedures Procedures (including critical care time)  Medications Ordered in ED Medications  acetaminophen (TYLENOL) tablet 325 mg (325 mg Oral Given 04/29/18 1444)     Initial Impression / Assessment and Plan / ED Course  I have reviewed the triage vital signs and the nursing notes.  Pertinent labs & imaging results that were available during my care of the patient were reviewed by me and considered in my medical decision making (see chart for details).  Clinical Course as of Apr 30 1451  Mon Apr 30, 9055  6232 57 year old male presents with complaint of cramping in his hands after dialysis today.  No other complaints, states this is happened previously.  CBC is unremarkable, BMP similar to previous, magnesium within normal limits.  Patient was given Tylenol and Gatorade for the cramping in his hands, advised to hold on his Norvasc for the next 2 days and follow-up with his primary care provider.  Case discussed with Dr. Ralene Bathe, ER attending, agrees with plan of care.   [LM]    Clinical Course User Index [LM] Tacy Learn, PA-C   Final Clinical Impressions(s) / ED Diagnoses   Final diagnoses:  Spasms of the hands or feet    ED Discharge Orders    None       Tacy Learn, PA-C 04/29/18 1452    Quintella Reichert, MD 04/30/18 [phone]

## 2018-04-29 NOTE — ED Notes (Signed)
Unable to obtain iv access, pa made aware. Offered po fluids, pt refused

## 2018-05-23 ENCOUNTER — Ambulatory Visit (INDEPENDENT_AMBULATORY_CARE_PROVIDER_SITE_OTHER): Payer: Self-pay | Admitting: Specialist

## 2018-11-29 ENCOUNTER — Other Ambulatory Visit: Payer: Self-pay

## 2018-11-29 ENCOUNTER — Emergency Department (HOSPITAL_COMMUNITY)
Admission: EM | Admit: 2018-11-29 | Discharge: 2018-11-29 | Disposition: A | Discharge status: Home / Self Care | Payer: 59 | Attending: Emergency Medicine | Admitting: Emergency Medicine

## 2018-11-29 ENCOUNTER — Emergency Department (HOSPITAL_COMMUNITY): Payer: 59

## 2018-11-29 ENCOUNTER — Encounter (HOSPITAL_COMMUNITY): Payer: Self-pay | Admitting: Emergency Medicine

## 2018-11-29 DIAGNOSIS — Z20828 Contact with and (suspected) exposure to other viral communicable diseases: Secondary | ICD-10-CM | POA: Insufficient documentation

## 2018-11-29 DIAGNOSIS — I129 Hypertensive chronic kidney disease with stage 1 through stage 4 chronic kidney disease, or unspecified chronic kidney disease: Secondary | ICD-10-CM | POA: Insufficient documentation

## 2018-11-29 DIAGNOSIS — N183 Chronic kidney disease, stage 3 (moderate): Secondary | ICD-10-CM | POA: Diagnosis not present

## 2018-11-29 DIAGNOSIS — R509 Fever, unspecified: Secondary | ICD-10-CM | POA: Insufficient documentation

## 2018-11-29 DIAGNOSIS — R05 Cough: Secondary | ICD-10-CM | POA: Diagnosis not present

## 2018-11-29 DIAGNOSIS — Z8546 Personal history of malignant neoplasm of prostate: Secondary | ICD-10-CM | POA: Diagnosis not present

## 2018-11-29 DIAGNOSIS — R059 Cough, unspecified: Secondary | ICD-10-CM

## 2018-11-29 DIAGNOSIS — Z79899 Other long term (current) drug therapy: Secondary | ICD-10-CM | POA: Diagnosis not present

## 2018-11-29 LAB — SARS CORONAVIRUS 2 BY RT PCR (HOSPITAL ORDER, PERFORMED IN ~~LOC~~ HOSPITAL LAB): SARS Coronavirus 2: NEGATIVE

## 2018-11-29 NOTE — ED Triage Notes (Signed)
Pt in, sent by kidney center for rapid Covid testing. Center states they need negative covid swabs to be able to dialyze by tonight. Informed we do not perform rapids unless pt getting admitted. Per center, pt's just need kidney labs and covid test if we will perform. Pt works at [employer], has had exposure to positive Covid's - is c/o cough x 1.5 wks

## 2018-11-29 NOTE — ED Provider Notes (Signed)
Care assumed from [place], please see her note for full details, but in brief [name] is a 58 y.o. male who presents for evaluation of 1.5 weeks of cough.  Patient receives dialysis and was sent by dialysis center for COVID test, patient will not be able to return for dialysis until he has had test performed.  Patient works at [employer] for [employer] and has had some positive COVID patients but wears protective gear at work.  No fevers, well-appearing.  Chest x-ray is clear.  COVID test pending at signout.  Labs Reviewed  SARS CORONAVIRUS 2 (HOSPITAL ORDER, [facility])   Dg Chest Port 1 View  Result Date: 11/29/2018 CLINICAL DATA:  Recent COVID-19 exposure with dry cough EXAM: PORTABLE CHEST 1 VIEW COMPARISON:  09/07/17 FINDINGS: The heart size and mediastinal contours are within normal limits. Both lungs are clear. The visualized skeletal structures are unremarkable. IMPRESSION: No active disease. Electronically Signed   By: Inez Catalina M.D.   On: 11/29/2018 15:45   Level test is negative and chest x-ray is clear.  Patient will be discharged home, he will be able to return to dialysis as usual.      Jacqlyn Larsen, Hershal Coria 11/29/18 Kathyrn Drown    Daleen Bo, MD 11/29/18 2155

## 2018-11-29 NOTE — Discharge Instructions (Signed)
Your COVID-19 test is negative and chest x-ray is clear.  You can return to dialysis as normal.  Continue to treat cough with over-the-counter medications as needed.  Return for worsening symptoms.

## 2018-11-29 NOTE — ED Provider Notes (Signed)
[facility] EMERGENCY DEPARTMENT Provider Note   CSN: [id number] Arrival date & time: 11/29/18  1349     History   Chief Complaint Chief Complaint  Patient presents with  . Cough    HPI [name] is a 58 y.o. male.     The history is provided by the patient. No language interpreter was used.  Cough Cough characteristics:  Non-productive Sputum characteristics:  Nondescript Severity:  Mild Onset quality:  Unable to specify Progression:  Worsening Chronicity:  New Relieved by:  Nothing Worsened by:  Nothing Ineffective treatments:  None tried Associated symptoms: fever   Associated symptoms: no chest pain and no rhinorrhea   Pt sent here from dialysis center for covid test.  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   Past Medical History:  Diagnosis Date  . Anemia 10/01/2015  . Arthritis    SPINE  . Borderline diabetes mellitus   . BPH (benign prostatic hypertrophy)   . Cancer ([place])   . CKD (chronic kidney disease), stage III ([place])   . Diabetes mellitus without complication (HCC)    borderline on no meds   . Dialysis patient ([place])   . Elevated PSA   . Gout    STABLE  PER PT 10-22-2013  . H/O hiatal hernia   . Hyperlipidemia   . Hypertension   . Microhematuria   . Nocturia   . OSA (obstructive sleep apnea)    PER PT STUDY DONE 2005 (APPROX).  NON- COMPLIANT CPAP  . SVT (supraventricular tachycardia) ([place])   . Wears glasses     Patient Active Problem List   Diagnosis Date Noted  . Symptomatic anemia   . CKD (chronic kidney disease)   . Hemoptysis 10/01/2015  . Anemia 10/01/2015  . Unintentional weight loss 10/01/2015  . OSA (obstructive sleep apnea)   . Baker's cyst of knee   . Joint effusion, knee   . Effusion of right knee 09/21/2014  . Epididymitis 09/21/2014  . Prostate cancer ([place]) 12/18/2013  . Weakness generalized 05/30/2013  . Prolonged QT interval 05/30/2013  . Other and unspecified  hyperlipidemia 05/30/2013  . Essential hypertension, benign 05/30/2013  . BPH (benign prostatic hypertrophy) 05/30/2013  . Acute gout 05/30/2013  . CKD (chronic kidney disease) stage 3, GFR 30-59 ml/min ([place]) 05/30/2013    Past Surgical History:  Procedure Laterality Date  . AV FISTULA PLACEMENT Left 08/17/2015   Procedure: BRACHIOCEPHALIC ARTERIOVENOUS (AV) FISTULA CREATION  left arm;  Surgeon: Conrad , MD;  Location: [facility];  Service: Vascular;  Laterality: Left;  . 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 CARDIAC CATHETERIZATION  04-07-2002  DR [facility]   NON-CRITICAL CAD/  PRESERVED LV  . CYSTOSCOPY W/ RETROGRADES Bilateral 10/27/2013   Procedure: CYSTOSCOPY WITH RETROGRADE PYELOGRAM;  Surgeon: Molli Hazard, MD;  Location: [facility];  Service: Urology;  Laterality: Bilateral;  . LYMPHADENECTOMY Bilateral 12/18/2013   Procedure: PELVIC LYMPH NODE DISSECTION;  Surgeon: Alexis Frock, MD;  Location: [facility];  Service: Urology;  Laterality: Bilateral;  . PROSTATE BIOPSY N/A 10/27/2013   Procedure: BIOPSY TRANSRECTAL ULTRASONIC PROSTATE (TUBP);  Surgeon: Molli Hazard, MD;  Location: [facility];  Service: Urology;  Laterality: N/A;  . PROSTATE SURGERY    . RIGHT HAND SURGERY  YRS AGO  . ROBOT ASSISTED LAPAROSCOPIC RADICAL PROSTATECTOMY  N/A 12/18/2013   Procedure: ROBOTIC ASSISTED LAPAROSCOPIC RADICAL PROSTATECTOMY AND INDOCYANINE GREEN DYE;  Surgeon: Alexis Frock, MD;  Location: [facility];  Service: Urology;  Laterality: N/A;  . TRANSTHORACIC ECHOCARDIOGRAM  07-03-2006   MILD - MODERATE LVH/  EF 55-60%/  GRADE I DIASTOIC DYSFUNCTION/  TRIVIAL  TR /  TRIVIAL PERICARDIAL EFFUSION        Home Medications    Prior to Admission medications   Medication Sig Start Date End Date Taking? Authorizing Provider  amLODipine (NORVASC) 5 MG tablet Take 5 mg by mouth as  needed.  08/23/17   [provider]  calcitRIOL (ROCALTROL) 0.5 MCG capsule Take 1 capsule (0.5 mcg total) by mouth daily. Patient not taking: Reported on 04/29/2018 10/09/15   Reyne Dumas, MD  calcium acetate (PHOSLO) 667 MG capsule Take 2 capsules (1,334 mg total) by mouth 3 (three) times daily with meals. Patient taking differently: Take 2,668 mg by mouth 3 (three) times daily with meals.  10/09/15   Reyne Dumas, MD  cinacalcet (SENSIPAR) 60 MG tablet Take 60 mg by mouth daily with supper. 03/15/18   [provider]  LORazepam (ATIVAN) 0.5 MG tablet Take 0.5 mg by mouth at bedtime as needed for anxiety or sleep.  12/31/16   [provider]  multivitamin (RENA-VIT) TABS tablet Take 1 tablet by mouth daily.    [provider]  oxyCODONE-acetaminophen (PERCOCET) 5-325 MG tablet Take 1 tablet by mouth every 4 (four) hours as needed. Patient not taking: Reported on 03/30/2018 12/25/17   Orpah Greek, MD  predniSONE (STERAPRED UNI-PAK 21 TAB) 10 MG (21) TBPK tablet Take by mouth daily. As directed Patient not taking: Reported on 03/30/2018 12/25/17   Orpah Greek, MD  sildenafil (VIAGRA) 50 MG tablet Take 50 mg by mouth daily as needed for erectile dysfunction. 01/25/18   [provider]  triamcinolone cream (KENALOG) 0.1 % Apply 1 application topically daily as needed (itchy skin).  03/20/18   [provider]  Vitamin D, Ergocalciferol, (DRISDOL) 50000 units CAPS capsule Take 100,000 Units by mouth 3 (three) times a week.     [provider]    Family History Family History  Problem Relation Age of Onset  . Other Mother        shot and killed  . Cirrhosis Father     Social History Social History   Tobacco Use  . Smoking status: Never Smoker  . Smokeless tobacco: Never Used  Substance Use Topics  . Alcohol use: No  . Drug use: No     Allergies   Penicillins   Review of Systems Review of Systems   Constitutional: Positive for fever.  HENT: Negative for rhinorrhea.   Respiratory: Positive for cough.   Cardiovascular: Negative for chest pain.  All other systems reviewed and are negative.    Physical Exam Updated Vital Signs BP 99/70 (BP Location: Left Arm)   Pulse 97   Temp 98.6 F (37 C) (Oral)   Resp 18   Wt 97.1 kg   SpO2 98%   BMI 29.86 kg/m   Physical Exam Vitals signs and nursing note reviewed.  Constitutional:      Appearance: He is well-developed.  HENT:     Head: Normocephalic and atraumatic.     Nose: Nose normal.     Mouth/Throat:     Mouth: Mucous membranes are moist.  Eyes:     Conjunctiva/sclera: Conjunctivae normal.  Neck:     Musculoskeletal: Neck supple.  Cardiovascular:     Rate and Rhythm: Normal rate and regular rhythm.     Heart sounds: No murmur.  Pulmonary:     Effort: Pulmonary effort is normal. No respiratory distress.     Breath sounds: Normal breath sounds.  Abdominal:     Palpations: Abdomen is soft.     Tenderness: There is no abdominal tenderness.  Musculoskeletal: Normal range of motion.  Skin:    General: Skin is warm and dry.  Neurological:     General: No focal deficit present.     Mental Status: He is alert.  Psychiatric:        Mood and Affect: Mood normal.      ED Treatments / Results  Labs (all labs ordered are listed, but only abnormal results are displayed) Labs Reviewed  SARS CORONAVIRUS 2 (HOSPITAL ORDER, [facility])    EKG    Radiology No results found.  Procedures Procedures (including critical care time)  Medications Ordered in ED Medications - No data to display   Initial Impression / Assessment and Plan / ED Course  I have reviewed the triage vital signs and the nursing notes.  Pertinent labs & imaging results that were available during my care of the patient were reviewed by me and considered in my medical decision making (see chart for details).        I  spoke to Dr. Jonnie Finner.   I will obtain chest xray and covid test (2 hour test ordered because pt needs dialysis)  Final Clinical Impressions(s) / ED Diagnoses   Final diagnoses:  Cough    ED Discharge Orders    None     Pt's care turned over to [facility] at [facility].    Fransico Meadow, PA-C 11/30/18 1210    Daleen Bo, MD 12/02/18 1045

## 2019-03-25 ENCOUNTER — Emergency Department (HOSPITAL_COMMUNITY): Payer: 59

## 2019-03-25 ENCOUNTER — Emergency Department (HOSPITAL_COMMUNITY)
Admission: EM | Admit: 2019-03-25 | Discharge: 2019-03-26 | Disposition: A | Discharge status: Home / Self Care | Payer: 59 | Attending: Emergency Medicine | Admitting: Emergency Medicine

## 2019-03-25 DIAGNOSIS — I120 Hypertensive chronic kidney disease with stage 5 chronic kidney disease or end stage renal disease: Secondary | ICD-10-CM | POA: Insufficient documentation

## 2019-03-25 DIAGNOSIS — R002 Palpitations: Secondary | ICD-10-CM | POA: Diagnosis present

## 2019-03-25 DIAGNOSIS — E1122 Type 2 diabetes mellitus with diabetic chronic kidney disease: Secondary | ICD-10-CM | POA: Insufficient documentation

## 2019-03-25 DIAGNOSIS — N186 End stage renal disease: Secondary | ICD-10-CM | POA: Diagnosis not present

## 2019-03-25 DIAGNOSIS — Z79899 Other long term (current) drug therapy: Secondary | ICD-10-CM | POA: Insufficient documentation

## 2019-03-25 DIAGNOSIS — Z992 Dependence on renal dialysis: Secondary | ICD-10-CM | POA: Insufficient documentation

## 2019-03-25 DIAGNOSIS — I471 Supraventricular tachycardia: Secondary | ICD-10-CM | POA: Insufficient documentation

## 2019-03-25 DIAGNOSIS — N183 Chronic kidney disease, stage 3 unspecified: Secondary | ICD-10-CM | POA: Insufficient documentation

## 2019-03-25 LAB — CBC
HCT: 34.6 % — ABNORMAL LOW (ref 39.0–52.0)
Hemoglobin: 11 g/dL — ABNORMAL LOW (ref 13.0–17.0)
MCH: 29.6 pg (ref 26.0–34.0)
MCHC: 31.8 g/dL (ref 30.0–36.0)
MCV: 93 fL (ref 80.0–100.0)
Platelets: 204 10*3/uL (ref 150–400)
RBC: 3.72 MIL/uL — ABNORMAL LOW (ref 4.22–5.81)
RDW: 15.5 % (ref 11.5–15.5)
WBC: 6.3 10*3/uL (ref 4.0–10.5)
nRBC: 0 % (ref 0.0–0.2)

## 2019-03-25 MED ORDER — SODIUM CHLORIDE 0.9% FLUSH
3.0000 mL | Freq: Once | INTRAVENOUS | Status: AC
  Administered 2019-03-26: 3 mL via INTRAVENOUS

## 2019-03-25 MED ORDER — SODIUM CHLORIDE 0.9 % IV BOLUS (SEPSIS)
1000.0000 mL | Freq: Once | INTRAVENOUS | Status: AC
  Administered 2019-03-26: 1000 mL via INTRAVENOUS

## 2019-03-25 MED ORDER — ADENOSINE 6 MG/2ML IV SOLN
6.0000 mg | Freq: Once | INTRAVENOUS | Status: AC
  Administered 2019-03-25: 6 mg via INTRAVENOUS
  Filled 2019-03-25: qty 2

## 2019-03-25 MED ORDER — ADENOSINE 6 MG/2ML IV SOLN
12.0000 mg | Freq: Once | INTRAVENOUS | Status: AC
  Administered 2019-03-25: 12 mg via INTRAVENOUS

## 2019-03-25 NOTE — ED Triage Notes (Signed)
Pt states his "heart has been beating out of his chest for an hour."  Dialysis pt, went to dialysis on Monday, Schedule to go tomorrow.

## 2019-03-25 NOTE — Discharge Instructions (Addendum)
We have discussed your case with the cardiologist on-call who would like to see you in the office.  They will call to schedule an appointment.  If you have not heard from them in the next couple of days, please call their office to schedule an appointment.  Cardiologist would like for [place] to start you on metoprolol 25 mg twice daily to help control your heart rate.  If you have return of chest pain, shortness of breath, sudden sweating, feel like you might pass out or you do pass out, rapid heartbeat that does not improve, please call 911 and return to the ER.

## 2019-03-25 NOTE — ED Provider Notes (Signed)
TIME SEEN: 11:33 PM  CHIEF COMPLAINT: Palpitations, chest pain  HPI: Patient is a 58 year old male with history of hypertension, hyperlipidemia, borderline diabetes, end-stage renal disease on hemodialysis Monday, Wednesday and Friday who was last dialyzed October 5 who presents to the emergency department with palpitations that started an hour prior to arrival.  He states that he has had some numbness in his left jaw and left chest with some tightness.  No shortness of breath, nausea, vomiting, diaphoresis or dizziness.  He states that his heart is racing so much it is making his "head balance".  He has had SVT several times in the past but has never seen cardiology per his report.  Does appear he saw cardiologist at [facility] in 2018.  He is not on any medications to control his heart rate.  No recent fevers, cough, vomiting, diarrhea.  No stimulant intake.  No illicit drug use.  ROS: See HPI Constitutional: no fever  Eyes: no drainage  ENT: no runny nose   Cardiovascular:   chest pain  Resp: no SOB  GI: no vomiting GU: no dysuria Integumentary: no rash  Allergy: no hives  Musculoskeletal: no leg swelling  Neurological: no slurred speech ROS otherwise negative  PAST MEDICAL HISTORY/PAST SURGICAL HISTORY:  Past Medical History:  Diagnosis Date  . Anemia 10/01/2015  . Arthritis    SPINE  . Borderline diabetes mellitus   . BPH (benign prostatic hypertrophy)   . Cancer ([place])   . CKD (chronic kidney disease), stage III ([place])   . Diabetes mellitus without complication (HCC)    borderline on no meds   . Dialysis patient ([place])   . Elevated PSA   . Gout    STABLE  PER PT 10-22-2013  . H/O hiatal hernia   . Hyperlipidemia   . Hypertension   . Microhematuria   . Nocturia   . OSA (obstructive sleep apnea)    PER PT STUDY DONE 2005 (APPROX).  NON- COMPLIANT CPAP  . SVT (supraventricular tachycardia) ([place])   . Wears glasses     MEDICATIONS:  Prior to Admission medications    Medication Sig Start Date End Date Taking? Authorizing Provider  amLODipine (NORVASC) 5 MG tablet Take 5 mg by mouth as needed.  08/23/17   [provider]  calcitRIOL (ROCALTROL) 0.5 MCG capsule Take 1 capsule (0.5 mcg total) by mouth daily. Patient not taking: Reported on 04/29/2018 10/09/15   Reyne Dumas, MD  calcium acetate (PHOSLO) 667 MG capsule Take 2 capsules (1,334 mg total) by mouth 3 (three) times daily with meals. Patient taking differently: Take 2,668 mg by mouth 3 (three) times daily with meals.  10/09/15   Reyne Dumas, MD  cinacalcet (SENSIPAR) 60 MG tablet Take 60 mg by mouth daily with supper. 03/15/18   [provider]  LORazepam (ATIVAN) 0.5 MG tablet Take 0.5 mg by mouth at bedtime as needed for anxiety or sleep.  12/31/16   [provider]  multivitamin (RENA-VIT) TABS tablet Take 1 tablet by mouth daily.    [provider]  oxyCODONE-acetaminophen (PERCOCET) 5-325 MG tablet Take 1 tablet by mouth every 4 (four) hours as needed. Patient not taking: Reported on 03/30/2018 12/25/17   Orpah Greek, MD  predniSONE (STERAPRED UNI-PAK 21 TAB) 10 MG (21) TBPK tablet Take by mouth daily. As directed Patient not taking: Reported on 03/30/2018 12/25/17   Orpah Greek, MD  sildenafil (VIAGRA) 50 MG tablet Take 50 mg by mouth daily as needed for erectile  dysfunction. 01/25/18   [provider]  triamcinolone cream (KENALOG) 0.1 % Apply 1 application topically daily as needed (itchy skin).  03/20/18   [provider]  Vitamin D, Ergocalciferol, (DRISDOL) 50000 units CAPS capsule Take 100,000 Units by mouth 3 (three) times a week.     [provider]    ALLERGIES:  Allergies  Allergen Reactions  . Penicillins Nausea And Vomiting and Rash    Has patient had a PCN reaction causing immediate rash, facial/tongue/throat swelling, SOB or lightheadedness with hypotension: yes Has patient had a PCN reaction causing  severe rash involving mucus membranes or skin necrosis: no Has patient had a PCN reaction that required hospitalization: no Has patient had a PCN reaction occurring within the last 10 years: No If all of the above answers are "NO", then may proceed with Cephalosporin use.     SOCIAL HISTORY:  Social History   Tobacco Use  . Smoking status: Never Smoker  . Smokeless tobacco: Never Used  Substance Use Topics  . Alcohol use: No    FAMILY HISTORY: Family History  Problem Relation Age of Onset  . Other Mother        shot and killed  . Cirrhosis Father     EXAM: BP (!) 139/92 (BP Location: Right Arm)   Pulse (!) 58   Temp 98.8 F (37.1 C) (Oral)   Resp 20   SpO2 95%  CONSTITUTIONAL: Alert and oriented and responds appropriately to questions. Well-appearing; well-nourished HEAD: Normocephalic EYES: Conjunctivae clear, pupils appear equal, EOMI ENT: normal nose; moist mucous membranes NECK: Supple, no meningismus, no nuchal rigidity, no LAD  CARD: Regular and tachycardic in the 180s; S1 and S2 appreciated; no murmurs, no clicks, no rubs, no gallops RESP: Normal chest excursion without splinting or tachypnea; breath sounds clear and equal bilaterally; no wheezes, no rhonchi, no rales, no hypoxia or respiratory distress, speaking full sentences ABD/GI: Normal bowel sounds; non-distended; soft, non-tender, no rebound, no guarding, no peritoneal signs, no hepatosplenomegaly BACK:  The back appears normal and is non-tender to palpation, there is no CVA tenderness EXT: Normal ROM in all joints; non-tender to palpation; no edema; normal capillary refill; no cyanosis, no calf tenderness or swelling    SKIN: Normal color for age and race; warm; no rash NEURO: Moves all extremities equally PSYCH: The patient's mood and manner are appropriate. Grooming and personal hygiene are appropriate.  MEDICAL DECISION MAKING: Patient here in SVT.  Reports vagal maneuvers at home have not helped.   Adenosine has helped previously.  He has never needed cardioversion.  He has not followed up with cardiology.  Given 6 mg of IV adenosine without relief.  Given 12 mg of IV adenosine and patient now in a sinus rhythm and reports symptoms have resolved.  Labs, x-ray pending.  We will continue to monitor in the ED.  Will check 2 sets of cardiac enzymes given he has multiple risk factors and did have chest pressure although likely rate related.  ED PROGRESS: 1:25 AM  Pt's potassium is 3.3.  Normal magnesium of 2.4.  First troponin was 17.  Second troponin 30.  May be slightly elevated secondary to his chronic kidney disease but also rate related.  He is not having any further chest discomfort and his heart rate is currently in the 80s.  He is in a sinus rhythm.  Will discuss with cardiology on-call for further recommendations.  1:35 AM  Discussed case with Dr. Einar Gip on-call for cardiology.  He thinks it would be reasonable to discharge patient home with close outpatient follow-up.  Patient would prefer this.  There is no delta change of his troponins of greater than 20.  Second troponin is only very mildly elevated and again this is in the setting of end-stage renal disease and having heart rate in the 180s.  He is chest pain-free currently and states he is feeling great.  Cardiology recommends starting metoprolol 25 mg twice daily.  Will give first dose here in the emergency department.  Plan is to discharge home with close follow-up.   At this time, I do not feel there is any life-threatening condition present. I have reviewed and discussed all results (EKG, imaging, lab, urine as appropriate) and exam findings with patient/family. I have reviewed nursing notes and appropriate previous records.  I feel the patient is safe to be discharged home without further emergent workup and can continue workup as an outpatient as needed. Discussed usual and customary return precautions. Patient/family verbalize  understanding and are comfortable with this plan.  Outpatient follow-up has been provided as needed. All questions have been answered.   EKG Interpretation  Date/Time:  Tuesday March 25 2019 22:53:14 EDT Ventricular Rate:  181 PR Interval:    QRS Duration: 78 QT Interval:  252 QTC Calculation: 437 R Axis:   4 Text Interpretation:  Supraventricular tachycardia Left ventricular hypertrophy with repolarization abnormality ( R in aVL ) Abnormal ECG Confirmed by Pryor Curia [phone]) on 03/25/2019 11:33:38 PM       EKG Interpretation  Date/Time:  Tuesday March 25 2019 23:55:19 EDT Ventricular Rate:  94 PR Interval:    QRS Duration: 91 QT Interval:  408 QTC Calculation: 511 R Axis:   4 Text Interpretation:  Sinus rhythm Prolonged PR interval Abnormal R-wave progression, early transition Left ventricular hypertrophy Borderline T abnormalities, lateral leads Prolonged QT interval SVT has resolved Confirmed by Pryor Curia [phone]) on 03/25/2019 11:58:39 PM         CRITICAL CARE Performed by: Cyril Mourning Winslow Ederer   Total critical care time: 45 minutes  Critical care time was exclusive of separately billable procedures and treating other patients.  Critical care was necessary to treat or prevent imminent or life-threatening deterioration.  Critical care was time spent personally by me on the following activities: development of treatment plan with patient and/or surrogate as well as nursing, discussions with consultants, evaluation of patient's response to treatment, examination of patient, obtaining history from patient or surrogate, ordering and performing treatments and interventions, ordering and review of laboratory studies, ordering and review of radiographic studies, pulse oximetry and re-evaluation of patient's condition.   STEFFEN HASE was evaluated in Emergency Department on 03/25/2019 for the symptoms described in the history of present illness. He was evaluated in the context  of the global COVID-19 pandemic, which necessitated consideration that the patient might be at risk for infection with the SARS-CoV-2 virus that causes COVID-19. Institutional protocols and algorithms that pertain to the evaluation of patients at risk for COVID-19 are in a state of rapid change based on information released by regulatory bodies including the CDC and federal and state organizations. These policies and algorithms were followed during the patient's care in the ED.    Amaira Safley, Delice Bison, DO 03/26/19 [phone]

## 2019-03-26 DIAGNOSIS — I471 Supraventricular tachycardia: Secondary | ICD-10-CM | POA: Diagnosis not present

## 2019-03-26 LAB — BASIC METABOLIC PANEL
Anion gap: 17 — ABNORMAL HIGH (ref 5–15)
BUN: 31 mg/dL — ABNORMAL HIGH (ref 6–20)
CO2: 26 mmol/L (ref 22–32)
Calcium: 8.3 mg/dL — ABNORMAL LOW (ref 8.9–10.3)
Chloride: 98 mmol/L (ref 98–111)
Creatinine, Ser: 11.43 mg/dL — ABNORMAL HIGH (ref 0.61–1.24)
GFR calc Af Amer: 5 mL/min — ABNORMAL LOW (ref >60)
GFR calc non Af Amer: 4 mL/min — ABNORMAL LOW (ref >60)
Glucose, Bld: 101 mg/dL — ABNORMAL HIGH (ref 70–99)
Potassium: 3.3 mmol/L — ABNORMAL LOW (ref 3.5–5.1)
Sodium: 141 mmol/L (ref 135–145)

## 2019-03-26 LAB — TROPONIN I (HIGH SENSITIVITY)
Troponin I (High Sensitivity): 17 ng/L (ref <18)
Troponin I (High Sensitivity): 30 ng/L — ABNORMAL HIGH (ref <18)

## 2019-03-26 LAB — MAGNESIUM: Magnesium: 2.4 mg/dL (ref 1.7–2.4)

## 2019-03-26 MED ORDER — METOPROLOL TARTRATE 25 MG PO TABS: 25.0000 mg | ORAL_TABLET | Freq: Two times a day (BID) | ORAL | 1 refills | Status: DC

## 2019-03-26 MED ORDER — METOPROLOL TARTRATE 25 MG PO TABS
25.0000 mg | ORAL_TABLET | Freq: Once | ORAL | Status: AC
  Administered 2019-03-26: 02:00:00 25 mg via ORAL
  Filled 2019-03-26: qty 1

## 2019-03-26 NOTE — ED Notes (Signed)
Discharge instructions discussed with pt. Pt verbalized understanding. Pt stable and ambulatory. No signature pad available. 

## 2019-04-03 ENCOUNTER — Encounter: Payer: 59 | Admitting: Vascular Surgery

## 2019-04-04 ENCOUNTER — Telehealth: Payer: Self-pay

## 2019-04-23 ENCOUNTER — Other Ambulatory Visit: Payer: Self-pay

## 2019-04-23 ENCOUNTER — Emergency Department (HOSPITAL_BASED_OUTPATIENT_CLINIC_OR_DEPARTMENT_OTHER)
Admission: EM | Admit: 2019-04-23 | Discharge: 2019-04-23 | Disposition: A | Discharge status: Home / Self Care | Payer: 59 | Attending: Emergency Medicine | Admitting: Emergency Medicine

## 2019-04-23 ENCOUNTER — Emergency Department (HOSPITAL_BASED_OUTPATIENT_CLINIC_OR_DEPARTMENT_OTHER): Payer: 59

## 2019-04-23 ENCOUNTER — Encounter (HOSPITAL_BASED_OUTPATIENT_CLINIC_OR_DEPARTMENT_OTHER): Payer: Self-pay

## 2019-04-23 DIAGNOSIS — I120 Hypertensive chronic kidney disease with stage 5 chronic kidney disease or end stage renal disease: Secondary | ICD-10-CM | POA: Diagnosis not present

## 2019-04-23 DIAGNOSIS — N186 End stage renal disease: Secondary | ICD-10-CM | POA: Insufficient documentation

## 2019-04-23 DIAGNOSIS — R1032 Left lower quadrant pain: Secondary | ICD-10-CM | POA: Diagnosis not present

## 2019-04-23 DIAGNOSIS — E1122 Type 2 diabetes mellitus with diabetic chronic kidney disease: Secondary | ICD-10-CM | POA: Diagnosis not present

## 2019-04-23 DIAGNOSIS — G8929 Other chronic pain: Secondary | ICD-10-CM | POA: Diagnosis not present

## 2019-04-23 DIAGNOSIS — Z992 Dependence on renal dialysis: Secondary | ICD-10-CM | POA: Insufficient documentation

## 2019-04-23 DIAGNOSIS — M545 Low back pain: Secondary | ICD-10-CM | POA: Insufficient documentation

## 2019-04-23 LAB — COMPREHENSIVE METABOLIC PANEL
ALT: 6 U/L (ref 0–44)
AST: 17 U/L (ref 15–41)
Albumin: 3.9 g/dL (ref 3.5–5.0)
Alkaline Phosphatase: 59 U/L (ref 38–126)
Anion gap: 12 (ref 5–15)
BUN: 15 mg/dL (ref 6–20)
CO2: 28 mmol/L (ref 22–32)
Calcium: 8.6 mg/dL — ABNORMAL LOW (ref 8.9–10.3)
Chloride: 97 mmol/L — ABNORMAL LOW (ref 98–111)
Creatinine, Ser: 6.5 mg/dL — ABNORMAL HIGH (ref 0.61–1.24)
GFR calc Af Amer: 10 mL/min — ABNORMAL LOW (ref >60)
GFR calc non Af Amer: 9 mL/min — ABNORMAL LOW (ref >60)
Glucose, Bld: 71 mg/dL (ref 70–99)
Potassium: 4.3 mmol/L (ref 3.5–5.1)
Sodium: 137 mmol/L (ref 135–145)
Total Bilirubin: 1.1 mg/dL (ref 0.3–1.2)
Total Protein: 7.4 g/dL (ref 6.5–8.1)

## 2019-04-23 LAB — CBC
HCT: 37.8 % — ABNORMAL LOW (ref 39.0–52.0)
Hemoglobin: 11.6 g/dL — ABNORMAL LOW (ref 13.0–17.0)
MCH: 29.2 pg (ref 26.0–34.0)
MCHC: 30.7 g/dL (ref 30.0–36.0)
MCV: 95.2 fL (ref 80.0–100.0)
Platelets: 147 10*3/uL — ABNORMAL LOW (ref 150–400)
RBC: 3.97 MIL/uL — ABNORMAL LOW (ref 4.22–5.81)
RDW: 16 % — ABNORMAL HIGH (ref 11.5–15.5)
WBC: 4.6 10*3/uL (ref 4.0–10.5)
nRBC: 0 % (ref 0.0–0.2)

## 2019-04-23 MED ORDER — PREDNISONE 50 MG PO TABS
60.0000 mg | ORAL_TABLET | Freq: Once | ORAL | Status: AC
  Administered 2019-04-23: 60 mg via ORAL
  Filled 2019-04-23: qty 1

## 2019-04-23 MED ORDER — PREDNISONE 20 MG PO TABS: 40.0000 mg | ORAL_TABLET | Freq: Every day | ORAL | 0 refills | Status: AC

## 2019-04-23 MED ORDER — HYDROMORPHONE HCL 1 MG/ML IJ SOLN
0.5000 mg | Freq: Once | INTRAMUSCULAR | Status: AC
  Administered 2019-04-23: 0.5 mg via INTRAMUSCULAR
  Filled 2019-04-23: qty 1

## 2019-04-23 MED ORDER — HYDROMORPHONE HCL 1 MG/ML IJ SOLN
0.5000 mg | Freq: Once | INTRAMUSCULAR | Status: DC
  Filled 2019-04-23: qty 1
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
## 2019-04-23 NOTE — Discharge Instructions (Addendum)
You were evaluated in the Emergency Department and after careful evaluation, we did not find any emergent condition requiring admission or further testing in the hospital.  Your exam/testing today is overall reassuring.  We suspect that your pain is related to nerve pain from your back.  It may also be related to your prostate.  Please take the medication provided as directed and follow-up with your primary care doctor.  Please return to the Emergency Department if you experience any worsening of your condition.  We encourage you to follow up with a primary care provider.  Thank you for allowing [place] to be a part of your care.
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
## 2019-04-24 ENCOUNTER — Emergency Department (HOSPITAL_COMMUNITY): Payer: 59

## 2019-04-24 ENCOUNTER — Encounter (HOSPITAL_COMMUNITY): Payer: Self-pay | Admitting: Emergency Medicine

## 2019-04-24 ENCOUNTER — Emergency Department (HOSPITAL_COMMUNITY)
Admission: EM | Admit: 2019-04-24 | Discharge: 2019-04-24 | Disposition: A | Discharge status: Home / Self Care | Payer: 59 | Attending: Emergency Medicine | Admitting: Emergency Medicine

## 2019-04-24 DIAGNOSIS — N183 Chronic kidney disease, stage 3 unspecified: Secondary | ICD-10-CM | POA: Insufficient documentation

## 2019-04-24 DIAGNOSIS — E1122 Type 2 diabetes mellitus with diabetic chronic kidney disease: Secondary | ICD-10-CM | POA: Insufficient documentation

## 2019-04-24 DIAGNOSIS — Z8546 Personal history of malignant neoplasm of prostate: Secondary | ICD-10-CM | POA: Insufficient documentation

## 2019-04-24 DIAGNOSIS — Z79899 Other long term (current) drug therapy: Secondary | ICD-10-CM | POA: Diagnosis not present

## 2019-04-24 DIAGNOSIS — I129 Hypertensive chronic kidney disease with stage 1 through stage 4 chronic kidney disease, or unspecified chronic kidney disease: Secondary | ICD-10-CM | POA: Diagnosis not present

## 2019-04-24 DIAGNOSIS — I471 Supraventricular tachycardia: Secondary | ICD-10-CM | POA: Diagnosis not present

## 2019-04-24 DIAGNOSIS — R072 Precordial pain: Secondary | ICD-10-CM | POA: Diagnosis present

## 2019-04-24 DIAGNOSIS — R079 Chest pain, unspecified: Secondary | ICD-10-CM

## 2019-04-24 LAB — CBC
HCT: 37.6 % — ABNORMAL LOW (ref 39.0–52.0)
Hemoglobin: 11.5 g/dL — ABNORMAL LOW (ref 13.0–17.0)
MCH: 29.3 pg (ref 26.0–34.0)
MCHC: 30.6 g/dL (ref 30.0–36.0)
MCV: 95.9 fL (ref 80.0–100.0)
Platelets: 219 K/uL (ref 150–400)
RBC: 3.92 MIL/uL — ABNORMAL LOW (ref 4.22–5.81)
RDW: 15.9 % — ABNORMAL HIGH (ref 11.5–15.5)
WBC: 9.6 K/uL (ref 4.0–10.5)
nRBC: 0 % (ref 0.0–0.2)

## 2019-04-24 LAB — BASIC METABOLIC PANEL WITH GFR
Anion gap: 20 — ABNORMAL HIGH (ref 5–15)
BUN: 31 mg/dL — ABNORMAL HIGH (ref 6–20)
CO2: 23 mmol/L (ref 22–32)
Calcium: 9.9 mg/dL (ref 8.9–10.3)
Chloride: 95 mmol/L — ABNORMAL LOW (ref 98–111)
Creatinine, Ser: 9.48 mg/dL — ABNORMAL HIGH (ref 0.61–1.24)
GFR calc Af Amer: 6 mL/min — ABNORMAL LOW
GFR calc non Af Amer: 5 mL/min — ABNORMAL LOW
Glucose, Bld: 123 mg/dL — ABNORMAL HIGH (ref 70–99)
Potassium: 3.7 mmol/L (ref 3.5–5.1)
Sodium: 138 mmol/L (ref 135–145)

## 2019-04-24 LAB — TROPONIN I (HIGH SENSITIVITY)
Troponin I (High Sensitivity): 15 ng/L (ref <18)
Troponin I (High Sensitivity): 241 ng/L (ref <18)

## 2019-04-24 LAB — TSH: TSH: 1.077 u[IU]/mL (ref 0.350–4.500)

## 2019-04-24 LAB — MAGNESIUM: Magnesium: 2.2 mg/dL (ref 1.7–2.4)

## 2019-04-24 MED ORDER — DILTIAZEM HCL ER COATED BEADS 180 MG PO CP24: 180.0000 mg | ORAL_CAPSULE | Freq: Every day | ORAL | 0 refills | Status: DC

## 2019-04-24 MED ORDER — SODIUM CHLORIDE 0.9% FLUSH: 3.0000 mL | Freq: Once | INTRAVENOUS | Status: DC

## 2019-04-24 MED ORDER — ADENOSINE 6 MG/2ML IV SOLN
INTRAVENOUS | Status: AC
  Administered 2019-04-24: 6 mg via INTRAVENOUS
  Filled 2019-04-24: qty 2

## 2019-04-24 MED ORDER — ADENOSINE 6 MG/2ML IV SOLN
6.0000 mg | Freq: Once | INTRAVENOUS | Status: AC
  Administered 2019-04-24: 13:00:00 6 mg via INTRAVENOUS

## 2019-04-24 MED ORDER — ADENOSINE 6 MG/2ML IV SOLN
12.0000 mg | Freq: Once | INTRAVENOUS | Status: AC
  Administered 2019-04-24: 13:00:00 12 mg via INTRAVENOUS

## 2019-04-24 NOTE — ED Notes (Signed)
Notified Dr. Melina Copa of pt's troponin
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
## 2019-04-24 NOTE — Discharge Instructions (Signed)
Your seen in the emergency department for palpitations and chest pain.  You were in a rapid heart rhythm called SVT.  Your symptoms improved with medication.  Please follow-up with your cardiologist as scheduled.  Return to the emergency department if any worsening symptoms.

## 2019-05-30 ENCOUNTER — Emergency Department (HOSPITAL_COMMUNITY): Payer: 59

## 2019-05-30 ENCOUNTER — Emergency Department (HOSPITAL_COMMUNITY)
Admission: EM | Admit: 2019-05-30 | Discharge: 2019-05-30 | Disposition: A | Discharge status: Home / Self Care | Payer: 59 | Attending: Emergency Medicine | Admitting: Emergency Medicine

## 2019-05-30 ENCOUNTER — Other Ambulatory Visit: Payer: Self-pay

## 2019-05-30 ENCOUNTER — Encounter (HOSPITAL_COMMUNITY): Payer: Self-pay | Admitting: Emergency Medicine

## 2019-05-30 DIAGNOSIS — I129 Hypertensive chronic kidney disease with stage 1 through stage 4 chronic kidney disease, or unspecified chronic kidney disease: Secondary | ICD-10-CM | POA: Diagnosis not present

## 2019-05-30 DIAGNOSIS — R042 Hemoptysis: Secondary | ICD-10-CM | POA: Diagnosis present

## 2019-05-30 DIAGNOSIS — E1122 Type 2 diabetes mellitus with diabetic chronic kidney disease: Secondary | ICD-10-CM | POA: Diagnosis not present

## 2019-05-30 DIAGNOSIS — Z79899 Other long term (current) drug therapy: Secondary | ICD-10-CM | POA: Diagnosis not present

## 2019-05-30 DIAGNOSIS — M79605 Pain in left leg: Secondary | ICD-10-CM | POA: Diagnosis not present

## 2019-05-30 DIAGNOSIS — Z992 Dependence on renal dialysis: Secondary | ICD-10-CM | POA: Diagnosis not present

## 2019-05-30 DIAGNOSIS — N183 Chronic kidney disease, stage 3 unspecified: Secondary | ICD-10-CM | POA: Insufficient documentation

## 2019-05-30 DIAGNOSIS — N451 Epididymitis: Secondary | ICD-10-CM | POA: Diagnosis not present

## 2019-05-30 LAB — BASIC METABOLIC PANEL
Anion gap: 14 (ref 5–15)
BUN: 51 mg/dL — ABNORMAL HIGH (ref 6–20)
CO2: 27 mmol/L (ref 22–32)
Calcium: 8.3 mg/dL — ABNORMAL LOW (ref 8.9–10.3)
Chloride: 100 mmol/L (ref 98–111)
Creatinine, Ser: 17.54 mg/dL — ABNORMAL HIGH (ref 0.61–1.24)
GFR calc Af Amer: 3 mL/min — ABNORMAL LOW (ref >60)
GFR calc non Af Amer: 3 mL/min — ABNORMAL LOW (ref >60)
Glucose, Bld: 93 mg/dL (ref 70–99)
Potassium: 4.5 mmol/L (ref 3.5–5.1)
Sodium: 141 mmol/L (ref 135–145)

## 2019-05-30 LAB — URINALYSIS, ROUTINE W REFLEX MICROSCOPIC
Bacteria, UA: NONE SEEN
Bilirubin Urine: NEGATIVE
Glucose, UA: 50 mg/dL — AB
Hgb urine dipstick: NEGATIVE
Ketones, ur: NEGATIVE mg/dL
Leukocytes,Ua: NEGATIVE
Nitrite: NEGATIVE
Protein, ur: 100 mg/dL — AB
Specific Gravity, Urine: 1.011 (ref 1.005–1.030)
pH: 9 — ABNORMAL HIGH (ref 5.0–8.0)

## 2019-05-30 LAB — CBC
HCT: 35.8 % — ABNORMAL LOW (ref 39.0–52.0)
Hemoglobin: 10.9 g/dL — ABNORMAL LOW (ref 13.0–17.0)
MCH: 29.1 pg (ref 26.0–34.0)
MCHC: 30.4 g/dL (ref 30.0–36.0)
MCV: 95.7 fL (ref 80.0–100.0)
Platelets: 217 10*3/uL (ref 150–400)
RBC: 3.74 MIL/uL — ABNORMAL LOW (ref 4.22–5.81)
RDW: 15.9 % — ABNORMAL HIGH (ref 11.5–15.5)
WBC: 7.9 10*3/uL (ref 4.0–10.5)
nRBC: 0 % (ref 0.0–0.2)

## 2019-05-30 MED ORDER — OXYCODONE-ACETAMINOPHEN 5-325 MG PO TABS
1.0000 | ORAL_TABLET | Freq: Once | ORAL | Status: AC
  Administered 2019-05-30: 1 via ORAL
  Filled 2019-05-30: qty 1

## 2019-05-30 MED ORDER — SULFAMETHOXAZOLE-TRIMETHOPRIM 800-160 MG PO TABS
1.0000 | ORAL_TABLET | Freq: Once | ORAL | Status: AC
  Administered 2019-05-30: 20:00:00 1 via ORAL
  Filled 2019-05-30: qty 1

## 2019-05-30 MED ORDER — SULFAMETHOXAZOLE-TRIMETHOPRIM 800-160 MG PO TABS: 1.0000 | ORAL_TABLET | Freq: Two times a day (BID) | ORAL | 0 refills | Status: AC

## 2019-05-30 NOTE — ED Triage Notes (Signed)
Patient c/o lower back pain along with coughing up blood onset of yesterday. States he has not had dialysis in 3 days. Last treatment was past Friday. Patient also adds that his left leg has been hurting him for a couple weeks and would like to be checked out for that. Patient ambulatory.

## 2019-05-30 NOTE — Discharge Instructions (Addendum)
Please finish your full course of antibiotics for the infection near your testicles.  You should return to dialysis on Monday as scheduled.  Please also follow up with your primary care doctor if you continue having blood-tinged sputum.  Your doctor may wish to order further tests as an outpatient.

## 2019-05-30 NOTE — ED Provider Notes (Signed)
[name] Provider Note   CSN: [id number] Arrival date & time: 05/30/19  1132     History Chief Complaint  Patient presents with  . Hemoptysis  . Leg Pain  . Missed Dialysis    [name] is a 58 y.o. male with a history of chronic kidney disease on dialysis Monday Wednesday Friday, who does make urine, history of diabetes, history of prostate cancer, presenting to the emergency department with blood-tinged sputum and pain in his groin.  Patient reports that he has not had dialysis since last Friday (7 days ago), because his wife had to fly emergently to [place] for kidney transplant, and he was down there with her.  He normally does not miss any dialysis.  His next session is on Monday.  He denies feeling short of breath or lightheaded or dizzy.  He does report to me that he has had some left-sided lower abdominal and groin pain for several days.  When asked where he gestures towards his scrotum.  He also feels some pain towards the upper side of his left leg.  He says the pain is worse in the morning when he is lying in bed or trying to lie down at night.  The pain gets better after he gets up and begins walking around the daytime.  The pain is also worse when he is coughing.  Does not appear to be associated with having bowel movements.  He is unaware of any history of hernia.  He has never had this kind of pain before.  He denies any history DVT or PE.  He does report to me that he has had a chronic cough and noted some blood-tinged sputum this week.  This is happened him in the past.  He is not a smoker reports no other pulmonary issues.  He does not feel short of breath.  HPI     Past Medical History:  Diagnosis Date  . Anemia 10/01/2015  . Arthritis    SPINE  . Borderline diabetes mellitus   . BPH (benign prostatic hypertrophy)   . Cancer ([place])   . CKD (chronic kidney disease), stage III   . Diabetes mellitus without complication  (HCC)    borderline on no meds   . Dialysis patient ([place])   . Elevated PSA   . Gout    STABLE  PER PT 10-22-2013  . H/O hiatal hernia   . Hyperlipidemia   . Hypertension   . Microhematuria   . Nocturia   . OSA (obstructive sleep apnea)    PER PT STUDY DONE 2005 (APPROX).  NON- COMPLIANT CPAP  . SVT (supraventricular tachycardia) ([place])   . Wears glasses     Patient Active Problem List   Diagnosis Date Noted  . Symptomatic anemia   . CKD (chronic kidney disease)   . Hemoptysis 10/01/2015  . Anemia 10/01/2015  . Unintentional weight loss 10/01/2015  . OSA (obstructive sleep apnea)   . Baker's cyst of knee   . Joint effusion, knee   . Effusion of right knee 09/21/2014  . Epididymitis 09/21/2014  . Prostate cancer ([place]) 12/18/2013  . Weakness generalized 05/30/2013  . Prolonged QT interval 05/30/2013  . Other and unspecified hyperlipidemia 05/30/2013  . Essential hypertension, benign 05/30/2013  . BPH (benign prostatic hypertrophy) 05/30/2013  . Acute gout 05/30/2013  . CKD (chronic kidney disease) stage 3, GFR 30-59 ml/min 05/30/2013    Past Surgical History:  Procedure Laterality Date  .  AV FISTULA PLACEMENT Left 08/17/2015   Procedure: BRACHIOCEPHALIC ARTERIOVENOUS (AV) FISTULA CREATION  left arm;  Surgeon: Jonathan [place], MD;  Location: [facility];  Service: Vascular;  Laterality: Left;  . CARDIAC CATHETERIZATION  10-04-2006   DR [name]   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%/  2ndDIAGONAL 30%/  RCA 40%/  EF 60%  . CARDIAC CATHETERIZATION  04-07-2002  DR [facility]   NON-CRITICAL CAD/  PRESERVED LV  . CYSTOSCOPY W/ RETROGRADES Bilateral 10/27/2013   Procedure: CYSTOSCOPY WITH RETROGRADE PYELOGRAM;  Surgeon: Jonathan Hazard, MD;  Location: [facility];  Service: Urology;  Laterality: Bilateral;  . LYMPHADENECTOMY Bilateral 12/18/2013   Procedure: PELVIC LYMPH NODE DISSECTION;  Surgeon: Jonathan Frock, MD;  Location: [facility];  Service: Urology;  Laterality: Bilateral;    . PROSTATE BIOPSY N/A 10/27/2013   Procedure: BIOPSY TRANSRECTAL ULTRASONIC PROSTATE (TUBP);  Surgeon: Jonathan Hazard, MD;  Location: [facility];  Service: Urology;  Laterality: N/A;  . PROSTATE SURGERY    . RIGHT HAND SURGERY  YRS AGO  . ROBOT ASSISTED LAPAROSCOPIC RADICAL PROSTATECTOMY N/A 12/18/2013   Procedure: ROBOTIC ASSISTED LAPAROSCOPIC RADICAL PROSTATECTOMY AND INDOCYANINE GREEN DYE;  Surgeon: Jonathan Frock, MD;  Location: [facility];  Service: Urology;  Laterality: N/A;  . TRANSTHORACIC ECHOCARDIOGRAM  07-03-2006   MILD - MODERATE LVH/  EF 55-60%/  GRADE I DIASTOIC DYSFUNCTION/  TRIVIAL  TR /  TRIVIAL PERICARDIAL EFFUSION       Family History  Problem Relation Age of Onset  . Other Mother        shot and killed  . Cirrhosis Father     Social History   Tobacco Use  . Smoking status: Never Smoker  . Smokeless tobacco: Never Used  Substance Use Topics  . Alcohol use: No  . Drug use: No    Home Medications Prior to Admission medications   Medication Sig Start Date End Date Taking? Authorizing Provider  AURYXIA 1 GM 210 MG(Fe) tablet Take 840 mg by mouth 3 (three) times daily with meals. 03/03/19   [provider]  cinacalcet (SENSIPAR) 60 MG tablet Take 60 mg by mouth daily with supper. 03/15/18   [provider]  diltiazem (CARDIZEM CD) 180 MG 24 hr capsule Take 1 capsule (180 mg total) by mouth daily. 04/24/19   Jonathan Rasmussen, MD  LORazepam (ATIVAN) 0.5 MG tablet Take 0.5 mg by mouth at bedtime as needed for anxiety or sleep.  12/31/16   [provider]  metoprolol tartrate (LOPRESSOR) 25 MG tablet Take 1 tablet (25 mg total) by mouth 2 (two) times daily. 03/26/19   [name], Jonathan Bison, DO  multivitamin (RENA-VIT) TABS tablet Take 1 tablet by mouth daily.    [provider]  sildenafil (VIAGRA) 50 MG tablet Take 50 mg by mouth daily as needed for erectile dysfunction. 01/25/18   [provider]   sulfamethoxazole-trimethoprim (BACTRIM DS) 800-160 MG tablet Take 1 tablet by mouth 2 (two) times daily for 10 days. 05/31/19 06/10/19  Jonathan Dusky, MD    Allergies    Penicillins  Review of Systems   Review of Systems  Constitutional: Negative for chills and fever.  Eyes: Negative for photophobia and visual disturbance.  Respiratory: Negative for cough and shortness of breath.   Cardiovascular: Negative for chest pain and palpitations.  Gastrointestinal: Positive for abdominal pain. Negative for constipation, diarrhea, nausea and vomiting.  Genitourinary: Positive for testicular pain. Negative for dysuria, flank pain, hematuria, penile swelling and scrotal swelling.  Musculoskeletal: Positive for myalgias. Negative for arthralgias.  Skin: Negative for pallor and rash.  Neurological: Negative for seizures, syncope, light-headedness and headaches.  Psychiatric/Behavioral: Negative for agitation and confusion.  All other systems reviewed and are negative.   Physical Exam Updated Vital Signs BP (!) 166/112 (BP Location: Right Arm)   Pulse 86   Temp 98 F (36.7 C) (Oral)   Resp 18   SpO2 97%   Physical Exam Vitals and nursing note reviewed.  Constitutional:      Appearance: He is well-developed.  HENT:     Head: Normocephalic and atraumatic.  Eyes:     Conjunctiva/sclera: Conjunctivae normal.     Pupils: Pupils are equal, round, and reactive to light.  Cardiovascular:     Rate and Rhythm: Normal rate and regular rhythm.     Pulses: Normal pulses.     Comments: Brisk pedal pulses bilaterally Pulmonary:     Effort: Pulmonary effort is normal. No respiratory distress.     Comments: Diminished breath sounds in left lung base 100% on room air Abdominal:     Palpations: Abdomen is soft.     Tenderness: There is no abdominal tenderness.  Genitourinary:    Comments: circumsized male No testicular enlargement or swelling Normal cremasteric reflex Left sided epididymal  tenderness No palpable hernia Musculoskeletal:     Cervical back: Neck supple.     Comments: Left arm fistula site patent No unilateral leg swelling  Skin:    General: Skin is warm and dry.  Neurological:     General: No focal deficit present.     Mental Status: He is alert and oriented to person, place, and time.     ED Results / Procedures / Treatments   Labs (all labs ordered are listed, but only abnormal results are displayed) Labs Reviewed  CBC - Abnormal; Notable for the following components:      Result Value   RBC 3.74 (*)    Hemoglobin 10.9 (*)    HCT 35.8 (*)    RDW 15.9 (*)    All other components within normal limits  BASIC METABOLIC PANEL - Abnormal; Notable for the following components:   BUN 51 (*)    Creatinine, Ser 17.54 (*)    Calcium 8.3 (*)    GFR calc non Af Amer 3 (*)    GFR calc Af Amer 3 (*)    All other components within normal limits  URINALYSIS, ROUTINE W REFLEX MICROSCOPIC - Abnormal; Notable for the following components:   pH 9.0 (*)    Glucose, UA 50 (*)    Protein, ur 100 (*)    All other components within normal limits  URINE CULTURE    EKG None  Radiology US Scrotum  Result Date: 05/30/2019 CLINICAL DATA:  Evaluate for epididymitis EXAM: ULTRASOUND OF SCROTUM TECHNIQUE: Complete ultrasound examination of the testicles, epididymis, and other scrotal structures was performed. COMPARISON:  Scrotal ultrasound 09/19/2014 FINDINGS: Right testicle Measurements: 3.7 x 2.0 x 2.7 cm. No testicular mass or microlithiasis visualized. Left testicle Measurements: 3.0 x 1.5 x 2.3 cm. No testicular mass or microlithiasis visualized. Right epididymis: Heterogeneously enlarged with increased vascularity. Left epididymis: Heterogeneously enlarged with increased vascularity. Two small 5 mm anechoic simple appearing cysts appear to be associated with the epididymal head. Hydrocele: Small bilateral hydroceles with low level internal echoes. Varicocele:   Increased vascularity without frank varicocele. IMPRESSION: Features compatible with bilateral epididymitis. Few epididymal cysts in the left epididymal head. Small bilateral hydroceles with  low level internal echoes, possibly reactive though sterility is not ascertained on imaging. Increased vascularity in the inguinal canals without frank varicocele. Electronically Signed   By: [name] M.D.   On: 05/30/2019 18:32   DG Chest Port 1 View  Result Date: 05/30/2019 CLINICAL DATA:  58 year old male with hemoptysis since last night. EXAM: PORTABLE CHEST 1 VIEW COMPARISON:  04/24/2019 and earlier. FINDINGS: Portable AP semi upright view at 1629 hours. Improved lung volumes since November. Stable cardiomegaly and mediastinal contours. Visualized tracheal air column is within normal limits. Allowing for portable technique the lungs are clear. No pneumothorax or pleural effusion. No acute osseous abnormality identified. IMPRESSION: Stable cardiomegaly. No acute cardiopulmonary abnormality. Electronically Signed   By: [name] M.D.   On: 05/30/2019 16:51    Procedures Procedures (including critical care time)  Medications Ordered in ED Medications  oxyCODONE-acetaminophen (PERCOCET/ROXICET) 5-325 MG per tablet 1 tablet (1 tablet Oral Given 05/30/19 1729)  sulfamethoxazole-trimethoprim (BACTRIM DS) 800-160 MG per tablet 1 tablet (1 tablet Oral Given 05/30/19 2012)    ED Course  I have reviewed the triage vital signs and the nursing notes.  Pertinent labs & imaging results that were available during my care of the patient were reviewed by me and considered in my medical decision making (see chart for details).  This patient is a very pleasant 58 year old gentleman with a history of end-stage renal disease on dialysis, he does make urine, presenting to the ED with hemoptysis and lower abdominal or scrotal pain.  On exam he does have tenderness of the left epididymis.  This seems to reproduce his  symptoms.  Do not feel any inguinal or abdominal hernia on my exam.  We will obtain a scrotal ultrasound to evaluate for possible epididymitis.  Have a low suspicion for ovarian torsion otherwise.  Clinically he is well-appearing, and I do not suspect there is an incarcerated hernia.  His labs are reassuring.  DVT is lower on my differential, given that his symptoms appear largely scrotal.  He does not have uniform leg swelling, and his symptoms are localized around his groin, not along the vasculature of the leg.  No indication for emergent dialysis.  No sign of volume overload, K+ wnl.  He has missed 3 sessions but does urinate 5-6 times per day.  He can follow up on Monday to resume his regular schedule.  Hemoptysis - appears minor, blood-tinged sputum.  Lungs CTAB and cxr clear.  No risk factors for TB.  Possibly bronchitis.  Less likely PE with no respiratory symptoms or dyspnea.  This note was dictated using dragon dictation software.  Please be aware that there may be minor translation errors as a result of this oral dictation     Clinical Course as of May 29 2136  Fri May 30, 2019  1839 MPRESSION: Features compatible with bilateral epididymitis. Few epididymal cysts in the left epididymal head.   [MT]  1948 Spoke to the patient as well as his daughter on the phone.  I explained his diagnosis of likely epididymitis.  I will start him on a 10-day course of Bactrim, I prefer to try to avoid Levaquin if possible given the QT prolonging agent and given his age and comorbidities.  I do not believe he needs emergent dialysis at this time.  Believe it is reasonable to wait till Monday for him to resume his normal schedule.  He does urinate regularly.  He had multiple questions for me about why he needs dialysis  3 times weekly, or whether he needs dialysis at all, and I advised him to follow-up with his nephrologist about this.  I explained that I am not in a position to make those decisions.    [MT]  1949 Finally, with his blood thinner sputum, most likely suspect that this is hemoptysis related to bronchitis.  There is a minor amount of blood.  Do not suspect pulmonary embolism at this time with no significant tachycardia or hypoxia and no other respiratory complaints.  Advised him to follow-up with his primary care doctor about this.   [MT]    Clinical Course User Index [MT] [name], Carola Rhine, MD    Final Clinical Impression(s) / ED Diagnoses Final diagnoses:  Hemoptysis  Epididymitis    Rx / DC Orders ED Discharge Orders         Ordered    sulfamethoxazole-trimethoprim (BACTRIM DS) 800-160 MG tablet  2 times daily     05/30/19 1948           Jonathan Dusky, MD 05/30/19 2137

## 2019-05-30 NOTE — ED Notes (Signed)
Patient verbalizes understanding of discharge instructions. Opportunity for questioning and answers were provided. Armband removed by staff, pt discharged from ED.  

## 2019-05-31 ENCOUNTER — Encounter (HOSPITAL_COMMUNITY): Payer: Self-pay | Admitting: *Deleted

## 2019-05-31 ENCOUNTER — Emergency Department (HOSPITAL_COMMUNITY): Payer: 59

## 2019-05-31 ENCOUNTER — Non-Acute Institutional Stay (HOSPITAL_COMMUNITY)
Admission: EM | Admit: 2019-05-31 | Discharge: 2019-05-31 | Disposition: A | Discharge status: Home / Self Care | Payer: 59 | Attending: Nephrology | Admitting: Nephrology

## 2019-05-31 ENCOUNTER — Other Ambulatory Visit: Payer: Self-pay

## 2019-05-31 DIAGNOSIS — G4733 Obstructive sleep apnea (adult) (pediatric): Secondary | ICD-10-CM | POA: Insufficient documentation

## 2019-05-31 DIAGNOSIS — I471 Supraventricular tachycardia: Secondary | ICD-10-CM | POA: Insufficient documentation

## 2019-05-31 DIAGNOSIS — Z20828 Contact with and (suspected) exposure to other viral communicable diseases: Secondary | ICD-10-CM | POA: Insufficient documentation

## 2019-05-31 DIAGNOSIS — N400 Enlarged prostate without lower urinary tract symptoms: Secondary | ICD-10-CM | POA: Insufficient documentation

## 2019-05-31 DIAGNOSIS — E785 Hyperlipidemia, unspecified: Secondary | ICD-10-CM | POA: Diagnosis not present

## 2019-05-31 DIAGNOSIS — Z992 Dependence on renal dialysis: Secondary | ICD-10-CM | POA: Diagnosis not present

## 2019-05-31 DIAGNOSIS — E1122 Type 2 diabetes mellitus with diabetic chronic kidney disease: Secondary | ICD-10-CM | POA: Insufficient documentation

## 2019-05-31 DIAGNOSIS — I10 Essential (primary) hypertension: Secondary | ICD-10-CM

## 2019-05-31 DIAGNOSIS — N186 End stage renal disease: Secondary | ICD-10-CM | POA: Diagnosis not present

## 2019-05-31 DIAGNOSIS — Z79899 Other long term (current) drug therapy: Secondary | ICD-10-CM | POA: Diagnosis not present

## 2019-05-31 DIAGNOSIS — Z880 Allergy status to penicillin: Secondary | ICD-10-CM | POA: Insufficient documentation

## 2019-05-31 DIAGNOSIS — I120 Hypertensive chronic kidney disease with stage 5 chronic kidney disease or end stage renal disease: Secondary | ICD-10-CM | POA: Insufficient documentation

## 2019-05-31 DIAGNOSIS — M479 Spondylosis, unspecified: Secondary | ICD-10-CM | POA: Diagnosis not present

## 2019-05-31 DIAGNOSIS — R042 Hemoptysis: Secondary | ICD-10-CM

## 2019-05-31 DIAGNOSIS — J810 Acute pulmonary edema: Secondary | ICD-10-CM

## 2019-05-31 DIAGNOSIS — J811 Chronic pulmonary edema: Secondary | ICD-10-CM | POA: Diagnosis present

## 2019-05-31 LAB — CBC
HCT: 34.6 % — ABNORMAL LOW (ref 39.0–52.0)
Hemoglobin: 10.9 g/dL — ABNORMAL LOW (ref 13.0–17.0)
MCH: 29.6 pg (ref 26.0–34.0)
MCHC: 31.5 g/dL (ref 30.0–36.0)
MCV: 94 fL (ref 80.0–100.0)
Platelets: 223 10*3/uL (ref 150–400)
RBC: 3.68 MIL/uL — ABNORMAL LOW (ref 4.22–5.81)
RDW: 15.9 % — ABNORMAL HIGH (ref 11.5–15.5)
WBC: 8.7 10*3/uL (ref 4.0–10.5)
nRBC: 0 % (ref 0.0–0.2)

## 2019-05-31 LAB — BASIC METABOLIC PANEL
Anion gap: 18 — ABNORMAL HIGH (ref 5–15)
BUN: 58 mg/dL — ABNORMAL HIGH (ref 6–20)
CO2: 22 mmol/L (ref 22–32)
Calcium: 8.5 mg/dL — ABNORMAL LOW (ref 8.9–10.3)
Chloride: 101 mmol/L (ref 98–111)
Creatinine, Ser: 18.67 mg/dL — ABNORMAL HIGH (ref 0.61–1.24)
GFR calc Af Amer: 3 mL/min — ABNORMAL LOW (ref >60)
GFR calc non Af Amer: 2 mL/min — ABNORMAL LOW (ref >60)
Glucose, Bld: 91 mg/dL (ref 70–99)
Potassium: 4.1 mmol/L (ref 3.5–5.1)
Sodium: 141 mmol/L (ref 135–145)

## 2019-05-31 LAB — RESPIRATORY PANEL BY RT PCR (FLU A&B, COVID)
Influenza A by PCR: NEGATIVE
Influenza B by PCR: NEGATIVE
SARS Coronavirus 2 by RT PCR: NEGATIVE

## 2019-05-31 LAB — POC SARS CORONAVIRUS 2 AG -  ED: SARS Coronavirus 2 Ag: NEGATIVE

## 2019-05-31 MED ORDER — CHLORHEXIDINE GLUCONATE CLOTH 2 % EX PADS: 6.0000 | MEDICATED_PAD | Freq: Every day | CUTANEOUS | Status: DC

## 2019-05-31 NOTE — ED Provider Notes (Signed)
[facility] EMERGENCY DEPARTMENT Provider Note   CSN: [id number] Arrival date & time: 05/31/19  3016     History Chief Complaint  Patient presents with   Hemoptysis    [name] is a 58 y.o. male.  Patient w hx esrd/hd, last hd on 12/4, c/o intermittent, productive cough for past 3-4 days. Symptoms acute onset, moderate, episodic, persistent, worse in past 2 days. Denies chest pain or discomfort. No sore throat or runny nose. No fever or chills. No known covid + exposure. Has missed dialysis since 12/4, due to wife having surgery. Denies leg pain or swelling. Cough productive of small amount of sputum that is blood tinged. No other abnormal bruising or bleeding. No anticoag use, other than w dialysis. Was recently seen in ED and placed on abx for ?possible epididymitis and bronchitis. Mild sob, DOE.  The history is provided by the patient and the EMS personnel.       Past Medical History:  Diagnosis Date   Anemia 10/01/2015   Arthritis    SPINE   Borderline diabetes mellitus    BPH (benign prostatic hypertrophy)    Cancer (HCC)    CKD (chronic kidney disease), stage III    Diabetes mellitus without complication ([place])    borderline on no meds    Dialysis patient ([place])    Elevated PSA    Gout    STABLE  PER PT 10-22-2013   H/O hiatal hernia    Hyperlipidemia    Hypertension    Microhematuria    Nocturia    OSA (obstructive sleep apnea)    PER PT STUDY DONE 2005 (APPROX).  NON- COMPLIANT CPAP   SVT (supraventricular tachycardia) (HCC)    Wears glasses     Patient Active Problem List   Diagnosis Date Noted   Symptomatic anemia    CKD (chronic kidney disease)    Hemoptysis 10/01/2015   Anemia 10/01/2015   Unintentional weight loss 10/01/2015   OSA (obstructive sleep apnea)    Baker's cyst of knee    Joint effusion, knee    Effusion of right knee 09/21/2014   Epididymitis 09/21/2014   Prostate cancer ([place])  12/18/2013   Weakness generalized 05/30/2013   Prolonged QT interval 05/30/2013   Other and unspecified hyperlipidemia 05/30/2013   Essential hypertension, benign 05/30/2013   BPH (benign prostatic hypertrophy) 05/30/2013   Acute gout 05/30/2013   CKD (chronic kidney disease) stage 3, GFR 30-59 ml/min 05/30/2013    Past Surgical History:  Procedure Laterality Date   AV FISTULA PLACEMENT Left 08/17/2015   Procedure: BRACHIOCEPHALIC ARTERIOVENOUS (AV) FISTULA CREATION  left arm;  Surgeon: Conrad Lynnwood-Pricedale, MD;  Location: [place];  Service: Vascular;  Laterality: Left;   CARDIAC CATHETERIZATION  10-04-2006   DR Rollene Fare   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%/  2ndDIAGONAL 30%/  RCA 40%/  EF 60%   CARDIAC CATHETERIZATION  04-07-2002  DR [facility]   NON-CRITICAL CAD/  PRESERVED LV   CYSTOSCOPY W/ RETROGRADES Bilateral 10/27/2013   Procedure: CYSTOSCOPY WITH RETROGRADE PYELOGRAM;  Surgeon: Molli Hazard, MD;  Location: [facility];  Service: Urology;  Laterality: Bilateral;   LYMPHADENECTOMY Bilateral 12/18/2013   Procedure: PELVIC LYMPH NODE DISSECTION;  Surgeon: Alexis Frock, MD;  [facility];  Service: Urology;  Laterality: Bilateral;   PROSTATE BIOPSY N/A 10/27/2013   Procedure: BIOPSY TRANSRECTAL ULTRASONIC PROSTATE (TUBP);  Surgeon: Molli Hazard, MD;  Location: [facility];  Service: Urology;  Laterality: N/A;  PROSTATE SURGERY     RIGHT HAND SURGERY  YRS AGO   ROBOT ASSISTED LAPAROSCOPIC RADICAL PROSTATECTOMY N/A 12/18/2013   Procedure: ROBOTIC ASSISTED LAPAROSCOPIC RADICAL PROSTATECTOMY AND INDOCYANINE GREEN DYE;  Surgeon: Alexis Frock, MD;  [facility];  Service: Urology;  Laterality: N/A;   TRANSTHORACIC ECHOCARDIOGRAM  07-03-2006   MILD - MODERATE LVH/  EF 55-60%/  GRADE I DIASTOIC DYSFUNCTION/  TRIVIAL  TR /  TRIVIAL PERICARDIAL EFFUSION       Family History  Problem Relation Age of Onset   Other Mother        shot  and killed   Cirrhosis Father     Social History   Tobacco Use   Smoking status: Never Smoker   Smokeless tobacco: Never Used  Substance Use Topics   Alcohol use: No   Drug use: No    Home Medications Prior to Admission medications   Medication Sig Start Date End Date Taking? Authorizing Provider  AURYXIA 1 GM 210 MG(Fe) tablet Take 840 mg by mouth 3 (three) times daily with meals. 03/03/19   [provider]  cinacalcet (SENSIPAR) 60 MG tablet Take 60 mg by mouth daily with supper. 03/15/18   [provider]  diltiazem (CARDIZEM CD) 180 MG 24 hr capsule Take 1 capsule (180 mg total) by mouth daily. 04/24/19   Hayden Rasmussen, MD  LORazepam (ATIVAN) 0.5 MG tablet Take 0.5 mg by mouth at bedtime as needed for anxiety or sleep.  12/31/16   [provider]  metoprolol tartrate (LOPRESSOR) 25 MG tablet Take 1 tablet (25 mg total) by mouth 2 (two) times daily. 03/26/19   Ward, Delice Bison, DO  multivitamin (RENA-VIT) TABS tablet Take 1 tablet by mouth daily.    [provider]  sildenafil (VIAGRA) 50 MG tablet Take 50 mg by mouth daily as needed for erectile dysfunction. 01/25/18   [provider]  sulfamethoxazole-trimethoprim (BACTRIM DS) 800-160 MG tablet Take 1 tablet by mouth 2 (two) times daily for 10 days. 05/31/19 06/10/19  Wyvonnia Dusky, MD    Allergies    Penicillins  Review of Systems   Review of Systems  Constitutional: Negative for fever.  HENT: Negative for sore throat.   Eyes: Negative for redness.  Respiratory: Positive for cough and shortness of breath.   Cardiovascular: Negative for chest pain and leg swelling.  Gastrointestinal: Negative for abdominal pain, blood in stool, diarrhea and vomiting.  Genitourinary: Negative for dysuria, flank pain and hematuria.  Musculoskeletal: Negative for back pain and neck pain.  Skin: Negative for rash.  Neurological: Negative for headaches.  Hematological: Does not bruise/bleed  easily.  Psychiatric/Behavioral: Negative for confusion.    Physical Exam Updated Vital Signs BP (!) 163/120 (BP Location: Right Arm)    Pulse (!) 110    Temp 98.7 F (37.1 C) (Oral)    Resp 18    Ht 1.803 m (5\' 11" )    Wt 96.2 kg    SpO2 96%    BMI 29.58 kg/m   Physical Exam Vitals and nursing note reviewed.  Constitutional:      Appearance: Normal appearance. He is well-developed.  HENT:     Head: Atraumatic.     Nose: Nose normal.     Mouth/Throat:     Mouth: Mucous membranes are moist.     Pharynx: Oropharynx is clear.  Eyes:     General: No scleral icterus.    Conjunctiva/sclera: Conjunctivae normal.  Neck:     Trachea:  No tracheal deviation.  Cardiovascular:     Rate and Rhythm: Regular rhythm. Tachycardia present.     Pulses: Normal pulses.     Heart sounds: Normal heart sounds. No murmur. No friction rub. No gallop.   Pulmonary:     Effort: Pulmonary effort is normal. No accessory muscle usage or respiratory distress.     Breath sounds: Rhonchi present.  Abdominal:     General: Bowel sounds are normal. There is no distension.     Palpations: Abdomen is soft.     Tenderness: There is no abdominal tenderness. There is no guarding.  Genitourinary:    Comments: No cva tenderness. Normal external gu exam. No testicular swelling or tenderness. No scrotal swelling, induration or abscess. No incarcerated hernia.  Musculoskeletal:        General: No swelling or tenderness.     Cervical back: Normal range of motion and neck supple. No rigidity.     Right lower leg: No edema.     Left lower leg: No edema.  Skin:    General: Skin is warm and dry.     Findings: No rash.  Neurological:     Mental Status: He is alert.     Comments: Alert, speech clear.   Psychiatric:        Mood and Affect: Mood normal.     ED Results / Procedures / Treatments   Labs (all labs ordered are listed, but only abnormal results are displayed) Results for orders placed or performed during  the hospital encounter of 97/02/63  Basic metabolic panel  Result Value Ref Range   Sodium 141 135 - 145 mmol/L   Potassium 4.1 3.5 - 5.1 mmol/L   Chloride 101 98 - 111 mmol/L   CO2 22 22 - 32 mmol/L   Glucose, Bld 91 70 - 99 mg/dL   BUN 58 (H) 6 - 20 mg/dL   Creatinine, Ser 18.67 (H) 0.61 - 1.24 mg/dL   Calcium 8.5 (L) 8.9 - 10.3 mg/dL   GFR calc non Af Amer 2 (L) >60 mL/min   GFR calc Af Amer 3 (L) >60 mL/min   Anion gap 18 (H) 5 - 15  CBC  Result Value Ref Range   WBC 8.7 4.0 - 10.5 K/uL   RBC 3.68 (L) 4.22 - 5.81 MIL/uL   Hemoglobin 10.9 (L) 13.0 - 17.0 g/dL   HCT 34.6 (L) 39.0 - 52.0 %   MCV 94.0 80.0 - 100.0 fL   MCH 29.6 26.0 - 34.0 pg   MCHC 31.5 30.0 - 36.0 g/dL   RDW 15.9 (H) 11.5 - 15.5 %   Platelets 223 150 - 400 K/uL   nRBC 0.0 0.0 - 0.2 %   US Scrotum  Result Date: 05/30/2019 CLINICAL DATA:  Evaluate for epididymitis EXAM: ULTRASOUND OF SCROTUM TECHNIQUE: Complete ultrasound examination of the testicles, epididymis, and other scrotal structures was performed. COMPARISON:  Scrotal ultrasound 09/19/2014 FINDINGS: Right testicle Measurements: 3.7 x 2.0 x 2.7 cm. No testicular mass or microlithiasis visualized. Left testicle Measurements: 3.0 x 1.5 x 2.3 cm. No testicular mass or microlithiasis visualized. Right epididymis: Heterogeneously enlarged with increased vascularity. Left epididymis: Heterogeneously enlarged with increased vascularity. Two small 5 mm anechoic simple appearing cysts appear to be associated with the epididymal head. Hydrocele: Small bilateral hydroceles with low level internal echoes. Varicocele:  Increased vascularity without frank varicocele. IMPRESSION: Features compatible with bilateral epididymitis. Few epididymal cysts in the left epididymal head. Small bilateral hydroceles with low level internal  echoes, possibly reactive though sterility is not ascertained on imaging. Increased vascularity in the inguinal canals without frank varicocele.  Electronically Signed   By: Lovena Le M.D.   On: 05/30/2019 18:32   DG Chest Port 1 View  Result Date: 05/30/2019 CLINICAL DATA:  58 year old male with hemoptysis since last night. EXAM: PORTABLE CHEST 1 VIEW COMPARISON:  04/24/2019 and earlier. FINDINGS: Portable AP semi upright view at 1629 hours. Improved lung volumes since November. Stable cardiomegaly and mediastinal contours. Visualized tracheal air column is within normal limits. Allowing for portable technique the lungs are clear. No pneumothorax or pleural effusion. No acute osseous abnormality identified. IMPRESSION: Stable cardiomegaly. No acute cardiopulmonary abnormality. Electronically Signed   By: Genevie Ann M.D.   On: 05/30/2019 16:51    ED ECG REPORT   Date: 05/31/2019  Rate: 105  Rhythm: sinus tachycardia  QRS Axis: normal  Intervals: normal  ST/T Wave abnormalities: nonspecific ST/T changes  Conduction Disutrbances:none  Narrative Interpretation:   Old EKG Reviewed: changes noted  I have personally reviewed the EKG tracing    Radiology US Scrotum  Result Date: 05/30/2019 CLINICAL DATA:  Evaluate for epididymitis EXAM: ULTRASOUND OF SCROTUM TECHNIQUE: Complete ultrasound examination of the testicles, epididymis, and other scrotal structures was performed. COMPARISON:  Scrotal ultrasound 09/19/2014 FINDINGS: Right testicle Measurements: 3.7 x 2.0 x 2.7 cm. No testicular mass or microlithiasis visualized. Left testicle Measurements: 3.0 x 1.5 x 2.3 cm. No testicular mass or microlithiasis visualized. Right epididymis: Heterogeneously enlarged with increased vascularity. Left epididymis: Heterogeneously enlarged with increased vascularity. Two small 5 mm anechoic simple appearing cysts appear to be associated with the epididymal head. Hydrocele: Small bilateral hydroceles with low level internal echoes. Varicocele:  Increased vascularity without frank varicocele. IMPRESSION: Features compatible with bilateral epididymitis.  Few epididymal cysts in the left epididymal head. Small bilateral hydroceles with low level internal echoes, possibly reactive though sterility is not ascertained on imaging. Increased vascularity in the inguinal canals without frank varicocele. Electronically Signed   By: Lovena Le M.D.   On: 05/30/2019 18:32   DG Chest Portable 1 View  Result Date: 05/31/2019 CLINICAL DATA:  Pt arrived by EMS for pink tinged blood in mucus that has been ongoing all night. Pt was seen here yesterday and discharged. Pt is a MWF dialysis patient, last treatment 8 days ago on (12/4), EXAM: PORTABLE CHEST 1 VIEW COMPARISON:  Radiograph 05/30/2019 FINDINGS: Mild enlarged cardiac silhouette. New airspace disease in the RIGHT lower lobe. LEFT lobe airspace disease to lesser degree. Upper lungs clear. No pleural fluid. IMPRESSION: Bilateral airspace disease greater in the RIGHT lower lobe. Differential includes pneumonia versus asymmetric pulmonary edema. Electronically Signed   By: Suzy Bouchard M.D.   On: 05/31/2019 07:57   DG Chest Port 1 View  Result Date: 05/30/2019 CLINICAL DATA:  58 year old male with hemoptysis since last night. EXAM: PORTABLE CHEST 1 VIEW COMPARISON:  04/24/2019 and earlier. FINDINGS: Portable AP semi upright view at 1629 hours. Improved lung volumes since November. Stable cardiomegaly and mediastinal contours. Visualized tracheal air column is within normal limits. Allowing for portable technique the lungs are clear. No pneumothorax or pleural effusion. No acute osseous abnormality identified. IMPRESSION: Stable cardiomegaly. No acute cardiopulmonary abnormality. Electronically Signed   By: Genevie Ann M.D.   On: 05/30/2019 16:51    Procedures Procedures (including critical care time)  Medications Ordered in ED Medications - No data to display  ED Course  I have reviewed the triage vital signs and the  nursing notes.  Pertinent labs & imaging results that were available during my care of  the patient were reviewed by me and considered in my medical decision making (see chart for details).  Continuous pulse ox and monitor. Iv ns. Stat labs. Cxr.    Hypoxic on room air, sats 87-88% - o2 [place].   covid test sent.   Reviewed nursing notes and prior charts for additional history.   Labs reviewed/interpreted by me - k normal. Cr v high.  CXR reviewed/interpreted by me - new infiltrates/edema,   Given last dialysis 8 days ago, increased sob, room air pulse ox currently 88%, new infiltrates/edema on cxr, coughing up pinkish sputum, will consult nephrology for emergent dialysis.   Additional labs reviewed/interpreted by me - POC covid test neg, will sed confirmatory testing/pcr.   CRITICAL CARE RE: ESRD/HD with several missed dialysis sessions with dyspnea/hpyoxia, pulmonary edema and uncontrolled htn requiring emergent dialysis.  Performed by: Mirna Mires Total critical care time: 35 minutes Critical care time was exclusive of separately billable procedures and treating other patients. Critical care was necessary to treat or prevent imminent or life-threatening deterioration. Critical care was time spent personally by me on the following activities: development of treatment plan with patient and/or surrogate as well as nursing, discussions with consultants, evaluation of patient's response to treatment, examination of patient, obtaining history from patient or surrogate, ordering and performing treatments and interventions, ordering and review of laboratory studies, ordering and review of radiographic studies, pulse oximetry and re-evaluation of patient's condition.       MDM Rules/Calculators/A&P   Final Clinical Impression(s) / ED Diagnoses Final diagnoses:  None    Rx / DC Orders ED Discharge Orders    None       Lajean Saver, MD 05/31/19 [phone]

## 2019-05-31 NOTE — Procedures (Signed)
   I was present at this dialysis session, have reviewed the session itself and made  appropriate changes Kelly Splinter MD [place] pager [phone]   05/31/2019, 5:15 PM

## 2019-05-31 NOTE — ED Notes (Signed)
Patient verbalizes understanding of discharge instructions. Opportunity for questioning and answers were provided. Armband removed by staff, pt discharged from ED ambulatory.   

## 2019-05-31 NOTE — ED Notes (Signed)
[phone] carrie call daughter when ready

## 2019-05-31 NOTE — Progress Notes (Signed)
Asked to see patient for pulm edema, missed HD x 2.  Patient was out of town in [place] visiting his wife who had complications following a kidney transplant. Pt states that OP HD was set up but they refused him twice there.  He went to hospital as well will there last Wed but "labs were okay so they sent me out". Comes in to ED w/ new cough, +/- SOB, no CP or fevers.  C-19 is negative here. CXR pulm edema. Afeb, VSS.  On exam mild diffuse wheezing, nasal O2, +JVD, L arm aVF +bruit. No LE edema.   Will plan on doing "extended" dialysis, meaning will plan for dc home from HD unit after HD done.    MWF G-O started 2017  4h  2/2 bath 96.5kg  Hep 2000  L AVF  Kelly Splinter, MD 05/31/2019, 10:48 AM

## 2019-05-31 NOTE — ED Triage Notes (Signed)
Pt arrived by EMS for pink tinged blood in mucus that has been ongoing all night. Pt was seen here yesterday and discharged. Pt is a MWF dialysis patient, last treatment 8 days ago on (12/4), missed treatments due to wife having a kidney transplant in [place].

## 2019-05-31 NOTE — ED Notes (Signed)
MD with dialysis team at bedside

## 2019-06-01 LAB — URINE CULTURE: Culture: NO GROWTH

## 2019-07-15 ENCOUNTER — Other Ambulatory Visit: Payer: Self-pay | Admitting: Internal Medicine

## 2019-07-15 ENCOUNTER — Ambulatory Visit
Admission: RE | Admit: 2019-07-15 | Discharge: 2019-07-15 | Disposition: A | Discharge status: Home / Self Care | Payer: 59 | Source: Ambulatory Visit | Attending: Internal Medicine | Admitting: Internal Medicine

## 2019-07-15 DIAGNOSIS — M25552 Pain in left hip: Secondary | ICD-10-CM

## 2019-08-10 NOTE — Progress Notes (Deleted)
Cardiology Office Note   Date:  08/10/2019   ID:  [name], DOB 28-Oct-1960, MRN [id number]  PCP:  Lorene Dy, MD  Cardiologist:   Larren Copes [place], MD   No chief complaint on file.     History of Present Illness: [name] is a 59 y.o. male who is seen at the request of Dr Mancel Bale for evaluation of...  He has a history of DM, HTN, HLD, OSA and ESRD on HD. Prior history of SVT. 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 Echo in 2008 was normal. He was seen in the ED on Apr 23, 2019 with SVT rate 170. Converted with IV adenosine.     Past Medical History:  Diagnosis Date  . Anemia 10/01/2015  . Arthritis    SPINE  . Borderline diabetes mellitus   . BPH (benign prostatic hypertrophy)   . Cancer ([place])   . CKD (chronic kidney disease), stage III   . Diabetes mellitus without complication (HCC)    borderline on no meds   . Dialysis patient ([place])   . Elevated PSA   . Gout    STABLE  PER PT 10-22-2013  . H/O hiatal hernia   . Hyperlipidemia   . Hypertension   . Microhematuria   . Nocturia   . OSA (obstructive sleep apnea)    PER PT STUDY DONE 2005 (APPROX).  NON- COMPLIANT CPAP  . SVT (supraventricular tachycardia) ([place])   . Wears glasses     Past Surgical History:  Procedure Laterality Date  . AV FISTULA PLACEMENT Left 08/17/2015   Procedure: BRACHIOCEPHALIC ARTERIOVENOUS (AV) FISTULA CREATION  left arm;  Surgeon: Conrad Beaumont, MD;  Location: [place];  Service: Vascular;  Laterality: Left;  . 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 CARDIAC CATHETERIZATION  04-07-2002  DR [facility]   NON-CRITICAL CAD/  PRESERVED LV  . CYSTOSCOPY W/ RETROGRADES Bilateral 10/27/2013   Procedure: CYSTOSCOPY WITH RETROGRADE PYELOGRAM;  Surgeon: Molli Hazard, MD;  [facility];  Service: Urology;  Laterality: Bilateral;  . LYMPHADENECTOMY Bilateral  12/18/2013   Procedure: PELVIC LYMPH NODE DISSECTION;  Surgeon: Alexis Frock, MD;  [facility];  Service: Urology;  Laterality: Bilateral;  . PROSTATE BIOPSY N/A 10/27/2013   Procedure: BIOPSY TRANSRECTAL ULTRASONIC PROSTATE (TUBP);  Surgeon: Molli Hazard, MD;  Location[facility];  Service: Urology;  Laterality: N/A;  . PROSTATE SURGERY    . RIGHT HAND SURGERY  YRS AGO  . ROBOT ASSISTED LAPAROSCOPIC RADICAL PROSTATECTOMY N/A 12/18/2013   Procedure: ROBOTIC ASSISTED LAPAROSCOPIC RADICAL PROSTATECTOMY AND INDOCYANINE GREEN DYE;  Surgeon: Alexis Frock, MD;  [facility];  Service: Urology;  Laterality: N/A;  . TRANSTHORACIC ECHOCARDIOGRAM  07-03-2006   MILD - MODERATE LVH/  EF 55-60%/  GRADE I DIASTOIC DYSFUNCTION/  TRIVIAL  TR /  TRIVIAL PERICARDIAL EFFUSION     Current Outpatient Medications  Medication Sig Dispense Refill  . AURYXIA 1 GM 210 MG(Fe) tablet Take 840 mg by mouth 3 (three) times daily with meals.    . cinacalcet (SENSIPAR) 60 MG tablet Take 60 mg by mouth daily with supper.  11  . diltiazem (CARDIZEM CD) 180 MG 24 hr capsule Take 1 capsule (180 mg total) by mouth daily. 30 capsule 0  . LORazepam (ATIVAN) 0.5 MG tablet Take 0.5 mg by mouth at bedtime  as needed for anxiety or sleep.   2  . metoprolol tartrate (LOPRESSOR) 25 MG tablet Take 1 tablet (25 mg total) by mouth 2 (two) times daily. 60 tablet 1  . multivitamin (RENA-VIT) TABS tablet Take 1 tablet by mouth daily.    . sildenafil (VIAGRA) 50 MG tablet Take 50 mg by mouth daily as needed for erectile dysfunction.  0   No current facility-administered medications for this visit.    Allergies:   Penicillins    Social History:  The patient  reports that he has never smoked. He has never used smokeless tobacco. He reports that he does not drink alcohol or use drugs.   Family History:  The patient's ***family history includes Cirrhosis in his father; Other in his mother.    ROS:  Please  see the history of present illness.   Otherwise, review of systems are positive for {NONE DEFAULTED:18576::"none"}.   All other systems are reviewed and negative.    PHYSICAL EXAM: VS:  There were no vitals taken for this visit. , BMI There is no height or weight on file to calculate BMI. GEN: Well nourished, well developed, in no acute distress  HEENT: normal  Neck: no JVD, carotid bruits, or masses Cardiac: ***RRR; no murmurs, rubs, or gallops,no edema  Respiratory:  clear to auscultation bilaterally, normal work of breathing GI: soft, nontender, nondistended, + BS MS: no deformity or atrophy  Skin: warm and dry, no rash Neuro:  Strength and sensation are intact Psych: euthymic mood, full affect   EKG:  EKG {ACTION; IS/IS BWI:[id number]} ordered today. The ekg ordered today demonstrates ***   Recent Labs: 04/23/2019: ALT 6 04/24/2019: Magnesium 2.2; TSH 1.077 05/31/2019: BUN 58; Creatinine, Ser 18.67; Hemoglobin 10.9; Platelets 223; Potassium 4.1; Sodium 141    Lipid Panel No results found for: CHOL, TRIG, HDL, CHOLHDL, VLDL, LDLCALC, LDLDIRECT    Wt Readings from Last 3 Encounters:  05/31/19 212 lb 1.3 oz (96.2 kg)  04/24/19 212 lb (96.2 kg)  11/29/18 214 lb 1.1 oz (97.1 kg)      Other studies Reviewed: Additional studies/ records that were reviewed today include: ***. Review of the above records demonstrates: ***   ASSESSMENT AND PLAN:  1.  ***   Current medicines are reviewed at length with the patient today.  The patient {ACTIONS; HAS/DOES NOT HAVE:19233} concerns regarding medicines.  The following changes have been made:  {PLAN; NO CHANGE:13088:s}  Labs/ tests ordered today include: *** No orders of the defined types were placed in this encounter.    Disposition:   FU with *** in {gen number 1-63:[id number]} {Days to years:10300}  Signed, Ziomara Birenbaum [place], MD  08/10/2019 12:17 PM    [facility] [address], [place], [place], [address]  Phone [phone], Fax [phone]

## 2019-08-14 ENCOUNTER — Ambulatory Visit: Payer: 59 | Admitting: Cardiology

## 2019-09-08 ENCOUNTER — Encounter: Payer: Self-pay | Admitting: Cardiology

## 2019-09-08 NOTE — Progress Notes (Deleted)
Cardiology Office Note   Date:  09/10/2019   ID:  [name], DOB March 10, 1961, MRN [id number]  PCP:  Lorene Dy, MD  Cardiologist:   Duward Allbritton Martinique, MD   No chief complaint on file.     History of Present Illness: [name] is a 59 y.o. male who is seen at the request of Dr Mancel Bale for evaluation of...  He has a history of DM, HTN, HLD, OSA and ESRD on HD. Prior history of SVT. 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 Echo in 2008 was normal. He was seen in the ED on Apr 23, 2019 with SVT rate 170. Converted with IV adenosine.     Past Medical History:  Diagnosis Date  . Anemia 10/01/2015  . Arthritis    SPINE  . Borderline diabetes mellitus   . BPH (benign prostatic hypertrophy)   . Cancer ([place])   . CKD (chronic kidney disease), stage III   . Diabetes mellitus without complication (HCC)    borderline on no meds   . Dialysis patient ([place])   . Elevated PSA   . Gout    STABLE  PER PT 10-22-2013  . H/O hiatal hernia   . Hyperlipidemia   . Hypertension   . Microhematuria   . Nocturia   . OSA (obstructive sleep apnea)    PER PT STUDY DONE 2005 (APPROX).  NON- COMPLIANT CPAP  . SVT (supraventricular tachycardia) ([place])   . Wears glasses     Past Surgical History:  Procedure Laterality Date  . AV FISTULA PLACEMENT Left 08/17/2015   Procedure: BRACHIOCEPHALIC ARTERIOVENOUS (AV) FISTULA CREATION  left arm;  Surgeon: Conrad Choccolocco, MD;  Location: [facility];  Service: Vascular;  Laterality: Left;  . 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 CARDIAC CATHETERIZATION  04-07-2002  DR [facility]   NON-CRITICAL CAD/  PRESERVED LV  . CYSTOSCOPY W/ RETROGRADES Bilateral 10/27/2013   Procedure: CYSTOSCOPY WITH RETROGRADE PYELOGRAM;  Surgeon: Molli Hazard, MD;  Location: [facility];  Service: Urology;  Laterality: Bilateral;  . LYMPHADENECTOMY Bilateral  12/18/2013   Procedure: PELVIC LYMPH NODE DISSECTION;  Surgeon: Alexis Frock, MD;  Location: [facility];  Service: Urology;  Laterality: Bilateral;  . PROSTATE BIOPSY N/A 10/27/2013   Procedure: BIOPSY TRANSRECTAL ULTRASONIC PROSTATE (TUBP);  Surgeon: Molli Hazard, MD;  Location[facility];  Service: Urology;  Laterality: N/A;  . PROSTATE SURGERY    . RIGHT HAND SURGERY  YRS AGO  . ROBOT ASSISTED LAPAROSCOPIC RADICAL PROSTATECTOMY N/A 12/18/2013   Procedure: ROBOTIC ASSISTED LAPAROSCOPIC RADICAL PROSTATECTOMY AND INDOCYANINE GREEN DYE;  Surgeon: Alexis Frock, MD;  Location: [facility];  Service: Urology;  Laterality: N/A;  . TRANSTHORACIC ECHOCARDIOGRAM  07-03-2006   MILD - MODERATE LVH/  EF 55-60%/  GRADE I DIASTOIC DYSFUNCTION/  TRIVIAL  TR /  TRIVIAL PERICARDIAL EFFUSION     Current Outpatient Medications  Medication Sig Dispense Refill  . AURYXIA 1 GM 210 MG(Fe) tablet Take 840 mg by mouth 3 (three) times daily with meals.    . cinacalcet (SENSIPAR) 60 MG tablet Take 60 mg by mouth daily with supper.  11  . diltiazem (CARDIZEM CD) 180 MG 24 hr capsule Take 1 capsule (180 mg total) by mouth daily. 30 capsule 0  . LORazepam (ATIVAN) 0.5 MG tablet Take 0.5 mg by mouth at bedtime  as needed for anxiety or sleep.   2  . metoprolol tartrate (LOPRESSOR) 25 MG tablet Take 1 tablet (25 mg total) by mouth 2 (two) times daily. 60 tablet 1  . multivitamin (RENA-VIT) TABS tablet Take 1 tablet by mouth daily.    . sildenafil (VIAGRA) 50 MG tablet Take 50 mg by mouth daily as needed for erectile dysfunction.  0   No current facility-administered medications for this visit.    Allergies:   Penicillins    Social History:  The patient  reports that he has never smoked. He has never used smokeless tobacco. He reports that he does not drink alcohol or use drugs.   Family History:  The patient's ***family history includes Cirrhosis in his father; Other in his mother.    ROS:  Please  see the history of present illness.   Otherwise, review of systems are positive for {NONE DEFAULTED:18576::"none"}.   All other systems are reviewed and negative.    PHYSICAL EXAM: VS:  There were no vitals taken for this visit. , BMI There is no height or weight on file to calculate BMI. GEN: Well nourished, well developed, in no acute distress  HEENT: normal  Neck: no JVD, carotid bruits, or masses Cardiac: ***RRR; no murmurs, rubs, or gallops,no edema  Respiratory:  clear to auscultation bilaterally, normal work of breathing GI: soft, nontender, nondistended, + BS MS: no deformity or atrophy  Skin: warm and dry, no rash Neuro:  Strength and sensation are intact Psych: euthymic mood, full affect   EKG:  EKG {ACTION; IS/IS YFV:[id number]} ordered today. The ekg ordered today demonstrates ***   Recent Labs: 04/23/2019: ALT 6 04/24/2019: Magnesium 2.2; TSH 1.077 05/31/2019: BUN 58; Creatinine, Ser 18.67; Hemoglobin 10.9; Platelets 223; Potassium 4.1; Sodium 141    Lipid Panel No results found for: CHOL, TRIG, HDL, CHOLHDL, VLDL, LDLCALC, LDLDIRECT    Wt Readings from Last 3 Encounters:  05/31/19 212 lb 1.3 oz (96.2 kg)  04/24/19 212 lb (96.2 kg)  11/29/18 214 lb 1.1 oz (97.1 kg)      Other studies Reviewed: Additional studies/ records that were reviewed today include: ***. Review of the above records demonstrates: ***   ASSESSMENT AND PLAN:  1.  ***   Current medicines are reviewed at length with the patient today.  The patient {ACTIONS; HAS/DOES NOT HAVE:19233} concerns regarding medicines.  The following changes have been made:  {PLAN; NO CHANGE:13088:s}  Labs/ tests ordered today include: *** No orders of the defined types were placed in this encounter.    Disposition:   FU with *** in {gen number 9-16:[id number]} {Days to years:10300}  Signed, Sarie Stall Martinique, MD  09/10/2019 7:32 AM    [facility] [address] [place], [place],  [address] Phone [phone], Fax [phone]

## 2019-09-11 ENCOUNTER — Ambulatory Visit: Payer: 59 | Admitting: Cardiology

## 2019-09-15 ENCOUNTER — Telehealth: Payer: Self-pay | Admitting: *Deleted

## 2019-09-15 NOTE — Telephone Encounter (Signed)
Patient was a No Show on 08/14/19 and 09/11/19--we are closing this referral

## 2019-09-23 ENCOUNTER — Other Ambulatory Visit: Payer: Self-pay

## 2019-09-23 ENCOUNTER — Ambulatory Visit (INDEPENDENT_AMBULATORY_CARE_PROVIDER_SITE_OTHER): Payer: 59 | Admitting: Vascular Surgery

## 2019-09-23 ENCOUNTER — Encounter: Payer: Self-pay | Admitting: Vascular Surgery

## 2019-09-23 DIAGNOSIS — T829XXA Unspecified complication of cardiac and vascular prosthetic device, implant and graft, initial encounter: Secondary | ICD-10-CM | POA: Insufficient documentation

## 2019-09-23 NOTE — Progress Notes (Signed)
Patient name: [name] MRN: [id number] DOB: 1961-05-02 Sex: male  REASON FOR CONSULT: Evaluate aneurysmal left arm AV fistula  HPI: [name] is a 59 y.o. male, with end-stage renal disease currently on dialysis Monday Wednesday Friday that presents for evaluation of aneurysmal left arm AV fistula.  Patient had fistula placed 08/17/2015 by Dr. Bridgett Larsson.  He states the fistula has been working great.  He has two tandem aneurysms that he feels are unchanged over the last year.  No ulcerated segments or otherwise thinning skin.  Has had no bleeding events other than when they just insert the needles into the fistula itself.  But he states when the needles come out they always get hemostasis without any trouble.  He is going to [place] [facility] to be evaluated for kidney transplant.  Past Medical History:  Diagnosis Date  . Anemia 10/01/2015  . Arthritis    SPINE  . Borderline diabetes mellitus   . BPH (benign prostatic hypertrophy)   . Cancer ([place])   . CKD (chronic kidney disease), stage III   . Diabetes mellitus without complication (HCC)    borderline on no meds   . Dialysis patient ([place])   . Elevated PSA   . Gout    STABLE  PER PT 10-22-2013  . H/O hiatal hernia   . Hyperlipidemia   . Hypertension   . Microhematuria   . Nocturia   . OSA (obstructive sleep apnea)    PER PT STUDY DONE 2005 (APPROX).  NON- COMPLIANT CPAP  . SVT (supraventricular tachycardia) ([place])   . Wears glasses     Past Surgical History:  Procedure Laterality Date  . AV FISTULA PLACEMENT Left 08/17/2015   Procedure: BRACHIOCEPHALIC ARTERIOVENOUS (AV) FISTULA CREATION  left arm;  Surgeon: Conrad Pinckard, MD;  Location: [facility];  Service: Vascular;  Laterality: Left;  . 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 CARDIAC CATHETERIZATION  04-07-2002  DR [facility]   NON-CRITICAL CAD/  PRESERVED LV  . CYSTOSCOPY W/ RETROGRADES  Bilateral 10/27/2013   Procedure: CYSTOSCOPY WITH RETROGRADE PYELOGRAM;  Surgeon: Molli Hazard, MD;  Location: [facility];  Service: Urology;  Laterality: Bilateral;  . LYMPHADENECTOMY Bilateral 12/18/2013   Procedure: PELVIC LYMPH NODE DISSECTION;  Surgeon: Alexis Frock, MD;  [facility];  Service: Urology;  Laterality: Bilateral;  . PROSTATE BIOPSY N/A 10/27/2013   Procedure: BIOPSY TRANSRECTAL ULTRASONIC PROSTATE (TUBP);  Surgeon: Molli Hazard, MD;  Location: [facility];  Service: Urology;  Laterality: N/A;  . PROSTATE SURGERY    . RIGHT HAND SURGERY  YRS AGO  . ROBOT ASSISTED LAPAROSCOPIC RADICAL PROSTATECTOMY N/A 12/18/2013   Procedure: ROBOTIC ASSISTED LAPAROSCOPIC RADICAL PROSTATECTOMY AND INDOCYANINE GREEN DYE;  Surgeon: Alexis Frock, MD;  [facility];  Service: Urology;  Laterality: N/A;  . TRANSTHORACIC ECHOCARDIOGRAM  07-03-2006   MILD - MODERATE LVH/  EF 55-60%/  GRADE I DIASTOIC DYSFUNCTION/  TRIVIAL  TR /  TRIVIAL PERICARDIAL EFFUSION    Family History  Problem Relation Age of Onset  . Other Mother        shot and killed  . Cirrhosis Father     SOCIAL HISTORY: Social History   Socioeconomic History  . Marital status: Married    Spouse name: Not on file  . Number of children: Not on file  . Years of education:  Not on file  . Highest education level: Not on file  Occupational History  . Not on file  Tobacco Use  . Smoking status: Never Smoker  . Smokeless tobacco: Never Used  Substance and Sexual Activity  . Alcohol use: No  . Drug use: No  . Sexual activity: Not on file  Other Topics Concern  . Not on file  Social History Narrative  . Not on file   Social Determinants of Health   Financial Resource Strain:   . Difficulty of Paying Living Expenses:   Food Insecurity:   . Worried About Charity fundraiser in the Last Year:   . Arboriculturist in the Last Year:   Transportation Needs:   . Lexicographer (Medical):   Marland Kitchen Lack of Transportation (Non-Medical):   Physical Activity:   . Days of Exercise per Week:   . Minutes of Exercise per Session:   Stress:   . Feeling of Stress :   Social Connections:   . Frequency of Communication with Friends and Family:   . Frequency of Social Gatherings with Friends and Family:   . Attends Religious Services:   . Active Member of Clubs or Organizations:   . Attends Archivist Meetings:   Marland Kitchen Marital Status:   Intimate Partner Violence:   . Fear of Current or Ex-Partner:   . Emotionally Abused:   Marland Kitchen Physically Abused:   . Sexually Abused:     Allergies  Allergen Reactions  . Penicillins Nausea And Vomiting and Rash    Has patient had a PCN reaction causing immediate rash, facial/tongue/throat swelling, SOB or lightheadedness with hypotension: yes Has patient had a PCN reaction causing severe rash involving mucus membranes or skin necrosis: no Has patient had a PCN reaction that required hospitalization: no Has patient had a PCN reaction occurring within the last 10 years: No If all of the above answers are "NO", then may proceed with Cephalosporin use.     Current Outpatient Medications  Medication Sig Dispense Refill  . AURYXIA 1 GM 210 MG(Fe) tablet Take 840 mg by mouth 3 (three) times daily with meals.    . cinacalcet (SENSIPAR) 60 MG tablet Take 60 mg by mouth daily with supper.  11  . diltiazem (CARDIZEM CD) 180 MG 24 hr capsule Take 1 capsule (180 mg total) by mouth daily. 30 capsule 0  . LORazepam (ATIVAN) 0.5 MG tablet Take 0.5 mg by mouth at bedtime as needed for anxiety or sleep.   2  . metoprolol tartrate (LOPRESSOR) 25 MG tablet Take 1 tablet (25 mg total) by mouth 2 (two) times daily. 60 tablet 1  . multivitamin (RENA-VIT) TABS tablet Take 1 tablet by mouth daily.    . sildenafil (VIAGRA) 50 MG tablet Take 50 mg by mouth daily as needed for erectile dysfunction.  0   No current facility-administered  medications for this visit.    REVIEW OF SYSTEMS:  [X]  denotes positive finding, [ ]  denotes negative finding Cardiac  Comments:  Chest pain or chest pressure:    Shortness of breath upon exertion:    Short of breath when lying flat:    Irregular heart rhythm:        Vascular    Pain in calf, thigh, or hip brought on by ambulation:    Pain in feet at night that wakes you up from your sleep:     Blood clot in your veins:    Leg swelling:  Pulmonary    Oxygen at home:    Productive cough:     Wheezing:         Neurologic    Sudden weakness in arms or legs:     Sudden numbness in arms or legs:     Sudden onset of difficulty speaking or slurred speech:    Temporary loss of vision in one eye:     Problems with dizziness:         Gastrointestinal    Blood in stool:     Vomited blood:         Genitourinary    Burning when urinating:     Blood in urine:        Psychiatric    Major depression:         Hematologic    Bleeding problems:    Problems with blood clotting too easily:        Skin    Rashes or ulcers:        Constitutional    Fever or chills:      PHYSICAL EXAM: Vitals:   09/23/19 0907  BP: 132/89  Pulse: 94  Resp: 18  Temp: 97.7 F (36.5 C)  TempSrc: Temporal  SpO2: 99%  Weight: 207 lb (93.9 kg)  Height: 5\' 11"  (1.803 m)    GENERAL: The patient is a well-nourished male, in no acute distress. The vital signs are documented above. CARDIAC: There is a regular rate and rhythm.  VASCULAR:  Good thrill left arm AVF - brachiocephalic Two tandem aneurysms as pictured below Healthy skin overall over aneurysms PULMONARY: There is good air exchange bilaterally without wheezing or rales. ABDOMEN: Soft and non-tender with normal pitched bowel sounds.  MUSCULOSKELETAL: There are no major deformities or cyanosis. NEUROLOGIC: No focal weakness or paresthesias are detected.       DATA:   None  Assessment/Plan:  59 year old male with  end-stage renal disease that presents for evaluation of left arm AV fistula aneurysmal segment.  On exam he certainly has two larger size tandem aneurysms as pictured above.  That being said he has no thinning skin or ulcerated segment or scab at this time.  The skin is actually fairly healthy over the aneurysms.  He feels the aneurysms are unchanged over the last year and the fistula seems to be working well for him.  Discussed given the above findings I would not recommend any revision at this time and we can continue observation.  Certainly if he develops ulcerated segment or wound etc. that would certainly prompt [place] to intervene.  Given the length of aneurysmal segment would likely require [facility] for temporary use after revision.  I provided him my card and we be happy to see him back if his exam changes - I also offered follow-up and he would prefer to call.   Marty Heck, MD [facility] Office: [phone]

## 2019-10-14 ENCOUNTER — Other Ambulatory Visit: Payer: Self-pay | Admitting: Orthopedic Surgery

## 2019-10-14 DIAGNOSIS — M545 Low back pain, unspecified: Secondary | ICD-10-CM

## 2019-11-08 ENCOUNTER — Other Ambulatory Visit: Payer: 59

## 2020-01-14 ENCOUNTER — Emergency Department (HOSPITAL_COMMUNITY)
Admission: EM | Admit: 2020-01-14 | Discharge: 2020-01-14 | Disposition: A | Discharge status: Home / Self Care | Payer: 59 | Attending: Emergency Medicine | Admitting: Emergency Medicine

## 2020-01-14 DIAGNOSIS — I471 Supraventricular tachycardia: Secondary | ICD-10-CM | POA: Diagnosis present

## 2020-01-14 DIAGNOSIS — Z992 Dependence on renal dialysis: Secondary | ICD-10-CM | POA: Insufficient documentation

## 2020-01-14 DIAGNOSIS — Z8546 Personal history of malignant neoplasm of prostate: Secondary | ICD-10-CM | POA: Insufficient documentation

## 2020-01-14 DIAGNOSIS — Z79899 Other long term (current) drug therapy: Secondary | ICD-10-CM | POA: Insufficient documentation

## 2020-01-14 DIAGNOSIS — E119 Type 2 diabetes mellitus without complications: Secondary | ICD-10-CM | POA: Diagnosis not present

## 2020-01-14 DIAGNOSIS — I129 Hypertensive chronic kidney disease with stage 1 through stage 4 chronic kidney disease, or unspecified chronic kidney disease: Secondary | ICD-10-CM | POA: Insufficient documentation

## 2020-01-14 DIAGNOSIS — N183 Chronic kidney disease, stage 3 unspecified: Secondary | ICD-10-CM | POA: Insufficient documentation

## 2020-01-14 LAB — BASIC METABOLIC PANEL
Anion gap: 14 (ref 5–15)
BUN: 14 mg/dL (ref 6–20)
CO2: 28 mmol/L (ref 22–32)
Calcium: 8.1 mg/dL — ABNORMAL LOW (ref 8.9–10.3)
Chloride: 95 mmol/L — ABNORMAL LOW (ref 98–111)
Creatinine, Ser: 9.35 mg/dL — ABNORMAL HIGH (ref 0.61–1.24)
GFR calc Af Amer: 6 mL/min — ABNORMAL LOW (ref >60)
GFR calc non Af Amer: 5 mL/min — ABNORMAL LOW (ref >60)
Glucose, Bld: 99 mg/dL (ref 70–99)
Potassium: 3.2 mmol/L — ABNORMAL LOW (ref 3.5–5.1)
Sodium: 137 mmol/L (ref 135–145)

## 2020-01-14 LAB — CBC
HCT: 45 % (ref 39.0–52.0)
Hemoglobin: 13.8 g/dL (ref 13.0–17.0)
MCH: 31.1 pg (ref 26.0–34.0)
MCHC: 30.7 g/dL (ref 30.0–36.0)
MCV: 101.4 fL — ABNORMAL HIGH (ref 80.0–100.0)
Platelets: 227 10*3/uL (ref 150–400)
RBC: 4.44 MIL/uL (ref 4.22–5.81)
RDW: 14.3 % (ref 11.5–15.5)
WBC: 6.6 10*3/uL (ref 4.0–10.5)
nRBC: 0 % (ref 0.0–0.2)

## 2020-01-14 NOTE — Discharge Instructions (Signed)
Your electrolytes looked okay today with your potassium and being very slightly low. Please continue to go to dialysis and take your home medications as directed.  Return to the emergency department if you have recurrent fast heart rates, shortness of breath, chest pain or other concerns.

## 2020-01-14 NOTE — ED Triage Notes (Signed)
Pt bib ems from dialysis center. Pt found to be in HR of 174 for 1 hour. BP initially 61'O systolic. Pt with hx of same. Given a total of 18mg  adenosine with conversion. Given 500cc bolus by ems. 20g R arm. 98/60 HR 104 ST 98% RA

## 2020-01-14 NOTE — ED Provider Notes (Signed)
[facility] EMERGENCY DEPARTMENT Provider Note   CSN: [id number] Arrival date & time: 01/14/20  3016     History Chief Complaint  Patient presents with   Tachycardia    [name] is a 59 y.o. male.  Patient presents to the emergency department from his dialysis center today with an episode of supraventricular tachycardia.  Patient states that he did not feel like he had much fluid to draw this morning.  While undergoing dialysis, he began to feel his heart racing.  He states that they placed him on oxygen and eventually called EMS.  They administered adenosine as his heart rate was in the 170s.  He converted after 18 mg total of adenosine.  Blood pressure was reportedly in the 80 systolic range per EMS.  Patient was given a 500 cc bolus of normal saline.  Patient in sinus tachycardia on arrival to the emergency department.  States that he feels better now and does not have complaints other than his back hurting from sitting on the stretcher.  No chest pain or shortness of breath.  Denies any recent illnesses.  States that he received both Covid vaccines.        Past Medical History:  Diagnosis Date   Anemia 10/01/2015   Arthritis    SPINE   Borderline diabetes mellitus    BPH (benign prostatic hypertrophy)    Cancer (HCC)    CKD (chronic kidney disease), stage III    Diabetes mellitus without complication ([place])    borderline on no meds    Dialysis patient ([place])    Elevated PSA    Gout    STABLE  PER PT 10-22-2013   H/O hiatal hernia    Hyperlipidemia    Hypertension    Microhematuria    Nocturia    OSA (obstructive sleep apnea)    PER PT STUDY DONE 2005 (APPROX).  NON- COMPLIANT CPAP   SVT (supraventricular tachycardia) (HCC)    Wears glasses     Patient Active Problem List   Diagnosis Date Noted   Complication of vascular access for dialysis 09/23/2019   Pulmonary edema 05/31/2019   Symptomatic anemia    CKD (chronic  kidney disease)    Hemoptysis 10/01/2015   Anemia 10/01/2015   Unintentional weight loss 10/01/2015   OSA (obstructive sleep apnea)    Baker's cyst of knee    Joint effusion, knee    Effusion of right knee 09/21/2014   Epididymitis 09/21/2014   Prostate cancer ([place]) 12/18/2013   Weakness generalized 05/30/2013   Prolonged QT interval 05/30/2013   Other and unspecified hyperlipidemia 05/30/2013   Essential hypertension, benign 05/30/2013   BPH (benign prostatic hypertrophy) 05/30/2013   Acute gout 05/30/2013   CKD (chronic kidney disease) stage 3, GFR 30-59 ml/min 05/30/2013    Past Surgical History:  Procedure Laterality Date   AV FISTULA PLACEMENT Left 08/17/2015   Procedure: BRACHIOCEPHALIC ARTERIOVENOUS (AV) FISTULA CREATION  left arm;  Surgeon: Conrad [place], MD;  Location: [place];  Service: Vascular;  Laterality: Left;   CARDIAC CATHETERIZATION  10-04-2006   DR Rollene Fare   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%/  2ndDIAGONAL 30%/  RCA 40%/  EF 60%   CARDIAC CATHETERIZATION  04-07-2002  DR Rollene Fare   NON-CRITICAL CAD/  PRESERVED LV   CYSTOSCOPY W/ RETROGRADES Bilateral 10/27/2013   Procedure: CYSTOSCOPY WITH RETROGRADE PYELOGRAM;  Surgeon: Molli Hazard, MD;  Location[facility];  Service: Urology;  Laterality: Bilateral;   LYMPHADENECTOMY Bilateral  12/18/2013   Procedure: PELVIC LYMPH NODE DISSECTION;  Surgeon: Alexis Frock, MD;  Location: [facility];  Service: Urology;  Laterality: Bilateral;   PROSTATE BIOPSY N/A 10/27/2013   Procedure: BIOPSY TRANSRECTAL ULTRASONIC PROSTATE (TUBP);  Surgeon: Molli Hazard, MD;  Location[facility];  Service: Urology;  Laterality: N/A;   PROSTATE SURGERY     RIGHT HAND SURGERY  YRS AGO   ROBOT ASSISTED LAPAROSCOPIC RADICAL PROSTATECTOMY N/A 12/18/2013   Procedure: ROBOTIC ASSISTED LAPAROSCOPIC RADICAL PROSTATECTOMY AND INDOCYANINE GREEN DYE;  Surgeon: Alexis Frock, MD;  Location: [facility];  Service: Urology;  Laterality: N/A;   TRANSTHORACIC ECHOCARDIOGRAM  07-03-2006   MILD - MODERATE LVH/  EF 55-60%/  GRADE I DIASTOIC DYSFUNCTION/  TRIVIAL  TR /  TRIVIAL PERICARDIAL EFFUSION       Family History  Problem Relation Age of Onset   Other Mother        shot and killed   Cirrhosis Father     Social History   Tobacco Use   Smoking status: Never Smoker   Smokeless tobacco: Never Used  Vaping Use   Vaping Use: Never used  Substance Use Topics   Alcohol use: No   Drug use: No    Home Medications Prior to Admission medications   Medication Sig Start Date End Date Taking? Authorizing Provider  AURYXIA 1 GM 210 MG(Fe) tablet Take 840 mg by mouth 3 (three) times daily with meals. 03/03/19  Yes [provider]  cinacalcet (SENSIPAR) 30 MG tablet Take 60 mg by mouth daily. 11/14/19  Yes [provider]  hydrOXYzine (ATARAX/VISTARIL) 25 MG tablet Take 25 mg by mouth daily as needed for itching. 09/15/19  Yes [provider]  LORazepam (ATIVAN) 0.5 MG tablet Take 0.5 mg by mouth at bedtime as needed for anxiety or sleep.  12/31/16  Yes [provider]  metoprolol tartrate (LOPRESSOR) 25 MG tablet Take 1 tablet (25 mg total) by mouth 2 (two) times daily. Patient taking differently: Take 25 mg by mouth daily. Original sig is 25mg  bid Pt is taking 25mg  qd 03/26/19  Yes Ward, Cyril Mourning N, DO  multivitamin (RENA-VIT) TABS tablet Take 1 tablet by mouth daily.   Yes [provider]  sildenafil (VIAGRA) 50 MG tablet Take 50 mg by mouth daily as needed for erectile dysfunction. 01/25/18  Yes [provider]  diltiazem (CARDIZEM CD) 180 MG 24 hr capsule Take 1 capsule (180 mg total) by mouth daily. Patient not taking: Reported on 01/14/2020 04/24/19   Hayden Rasmussen, MD    Allergies    Penicillins  Review of Systems   Review of Systems  Constitutional: Negative for diaphoresis and fever.  Eyes: Negative for redness.    Respiratory: Negative for cough and shortness of breath.   Cardiovascular: Positive for palpitations. Negative for chest pain and leg swelling.  Gastrointestinal: Negative for abdominal pain, nausea and vomiting.  Genitourinary: Negative for flank pain.  Musculoskeletal: Negative for back pain and neck pain.  Skin: Negative for rash.  Neurological: Negative for syncope and light-headedness.  Psychiatric/Behavioral: The patient is not nervous/anxious.     Physical Exam Updated Vital Signs BP 117/76 (BP Location: Right Arm)    Pulse 102    Temp 98.6 F (37 C) (Oral)    Resp 18    SpO2 99%   Physical Exam Vitals and nursing note reviewed.  Constitutional:      Appearance: He is well-developed. He is not diaphoretic.  HENT:  Head: Normocephalic and atraumatic.     Mouth/Throat:     Mouth: Mucous membranes are not dry.  Eyes:     Conjunctiva/sclera: Conjunctivae normal.  Neck:     Vascular: Normal carotid pulses. No carotid bruit or JVD.     Trachea: Trachea normal. No tracheal deviation.  Cardiovascular:     Rate and Rhythm: Regular rhythm. Tachycardia present.     Pulses: No decreased pulses.     Heart sounds: Normal heart sounds, S1 normal and S2 normal. Heart sounds not distant. No murmur heard.   Pulmonary:     Effort: Pulmonary effort is normal. No respiratory distress.     Breath sounds: Normal breath sounds. No wheezing.  Chest:     Chest wall: No tenderness.  Abdominal:     General: Bowel sounds are normal.     Palpations: Abdomen is soft.     Tenderness: There is no abdominal tenderness. There is no guarding or rebound.  Musculoskeletal:     Cervical back: Normal range of motion and neck supple. No muscular tenderness.     Right lower leg: No edema.     Left lower leg: No edema.  Skin:    General: Skin is warm and dry.     Coloration: Skin is not pale.  Neurological:     Mental Status: He is alert.     ED Results / Procedures / Treatments   Labs (all  labs ordered are listed, but only abnormal results are displayed) Labs Reviewed  CBC - Abnormal; Notable for the following components:      Result Value   MCV 101.4 (*)    All other components within normal limits  BASIC METABOLIC PANEL - Abnormal; Notable for the following components:   Potassium 3.2 (*)    Chloride 95 (*)    Creatinine, Ser 9.35 (*)    Calcium 8.1 (*)    GFR calc non Af Amer 5 (*)    GFR calc Af Amer 6 (*)    All other components within normal limits    ED ECG REPORT   Date: 01/14/2020  Rate: 102  Rhythm: sinus tachycardia  QRS Axis: normal  Intervals: normal  ST/T Wave abnormalities: nonspecific T wave changes  Conduction Disutrbances:none  Narrative Interpretation:   Old EKG Reviewed: changes noted, improved TWI from previous  I have personally reviewed the EKG tracing and agree with the computerized printout as noted.  Radiology No results found.  Procedures Procedures (including critical care time)  Medications Ordered in ED Medications - No data to display  ED Course  I have reviewed the triage vital signs and the nursing notes.  Pertinent labs & imaging results that were available during my care of the patient were reviewed by me and considered in my medical decision making (see chart for details).  Patient seen and examined.  Patient conversant, very pleasant.  He is back to his baseline currently does not report any symptoms.  Plan is to check EKG, electrolytes.  If these look okay, anticipate discharge to home.  Vital signs reviewed and are as follows: BP 117/76 (BP Location: Right Arm)    Pulse 102    Temp 98.6 F (37 C) (Oral)    Resp 18    SpO2 99%   9:49 AM K 3.2, labs OK. EKG reviewed. Discussed with Dr. Reather Converse. Plan: d/c home.   BP 99/79    Pulse 90    Temp 98.6 F (37 C) (Oral)  Resp 18    SpO2 96%   10:07 AM Ready for d/c.   Patient urged to return with worsening symptoms or other concerns. Patient verbalized understanding  and agrees with plan.     MDM Rules/Calculators/A&P                          Patient with history of end-stage renal disease with episode of supraventricular tachycardia today, converted by EMS with adenosine. Checked electrolytes today. Potassium 3.2, however no repletion given due to patient being a dialysis patient. EKG reviewed, improved from previous. Noticed concerning signs for ischemia today. Patient without chest pain. He is back at his baseline will be discharged home.   Final Clinical Impression(s) / ED Diagnoses Final diagnoses:  SVT (supraventricular tachycardia) [facility])    Rx / DC Orders ED Discharge Orders    None       Carlisle Cater, PA-C 01/14/20 1008    Elnora Morrison, MD 01/14/20 [phone]

## 2020-01-14 NOTE — ED Notes (Signed)
Patient Alert and oriented to baseline. Stable and ambulatory to baseline. Patient verbalized understanding of the discharge instructions.  Patient belongings were taken by the patient.   

## 2020-06-04 ENCOUNTER — Other Ambulatory Visit: Payer: Self-pay

## 2020-06-04 ENCOUNTER — Observation Stay (HOSPITAL_COMMUNITY)
Admission: EM | Admit: 2020-06-04 | Discharge: 2020-06-05 | Disposition: A | Discharge status: Home / Self Care | Payer: 59 | Attending: Internal Medicine | Admitting: Internal Medicine

## 2020-06-04 ENCOUNTER — Encounter (HOSPITAL_COMMUNITY): Payer: Self-pay

## 2020-06-04 DIAGNOSIS — K922 Gastrointestinal hemorrhage, unspecified: Principal | ICD-10-CM | POA: Diagnosis present

## 2020-06-04 DIAGNOSIS — K642 Third degree hemorrhoids: Secondary | ICD-10-CM | POA: Diagnosis not present

## 2020-06-04 DIAGNOSIS — F419 Anxiety disorder, unspecified: Secondary | ICD-10-CM | POA: Diagnosis not present

## 2020-06-04 DIAGNOSIS — I10 Essential (primary) hypertension: Secondary | ICD-10-CM | POA: Diagnosis not present

## 2020-06-04 DIAGNOSIS — I120 Hypertensive chronic kidney disease with stage 5 chronic kidney disease or end stage renal disease: Secondary | ICD-10-CM | POA: Insufficient documentation

## 2020-06-04 DIAGNOSIS — Z992 Dependence on renal dialysis: Secondary | ICD-10-CM | POA: Insufficient documentation

## 2020-06-04 DIAGNOSIS — N186 End stage renal disease: Secondary | ICD-10-CM

## 2020-06-04 DIAGNOSIS — K625 Hemorrhage of anus and rectum: Secondary | ICD-10-CM

## 2020-06-04 DIAGNOSIS — Z20822 Contact with and (suspected) exposure to covid-19: Secondary | ICD-10-CM | POA: Insufficient documentation

## 2020-06-04 DIAGNOSIS — Z79899 Other long term (current) drug therapy: Secondary | ICD-10-CM | POA: Insufficient documentation

## 2020-06-04 DIAGNOSIS — K921 Melena: Secondary | ICD-10-CM

## 2020-06-04 HISTORY — DX: End stage renal disease: Z99.2

## 2020-06-04 HISTORY — DX: End stage renal disease: N18.6

## 2020-06-04 LAB — CBC
HCT: 43.5 % (ref 39.0–52.0)
Hemoglobin: 13.9 g/dL (ref 13.0–17.0)
MCH: 32.6 pg (ref 26.0–34.0)
MCHC: 32 g/dL (ref 30.0–36.0)
MCV: 101.9 fL — ABNORMAL HIGH (ref 80.0–100.0)
Platelets: 183 10*3/uL (ref 150–400)
RBC: 4.27 MIL/uL (ref 4.22–5.81)
RDW: 16.1 % — ABNORMAL HIGH (ref 11.5–15.5)
WBC: 6.6 10*3/uL (ref 4.0–10.5)
nRBC: 0 % (ref 0.0–0.2)

## 2020-06-04 LAB — TYPE AND SCREEN
ABO/RH(D): O POS
Antibody Screen: NEGATIVE

## 2020-06-04 LAB — RESP PANEL BY RT-PCR (FLU A&B, COVID) ARPGX2
Influenza A by PCR: NEGATIVE
Influenza B by PCR: NEGATIVE
SARS Coronavirus 2 by RT PCR: NEGATIVE

## 2020-06-04 LAB — COMPREHENSIVE METABOLIC PANEL
ALT: 9 U/L (ref 0–44)
AST: 11 U/L — ABNORMAL LOW (ref 15–41)
Albumin: 3.6 g/dL (ref 3.5–5.0)
Alkaline Phosphatase: 53 U/L (ref 38–126)
Anion gap: 13 (ref 5–15)
BUN: 17 mg/dL (ref 6–20)
CO2: 32 mmol/L (ref 22–32)
Calcium: 8.8 mg/dL — ABNORMAL LOW (ref 8.9–10.3)
Chloride: 92 mmol/L — ABNORMAL LOW (ref 98–111)
Creatinine, Ser: 8.86 mg/dL — ABNORMAL HIGH (ref 0.61–1.24)
GFR, Estimated: 6 mL/min — ABNORMAL LOW (ref >60)
Glucose, Bld: 120 mg/dL — ABNORMAL HIGH (ref 70–99)
Potassium: 3.6 mmol/L (ref 3.5–5.1)
Sodium: 137 mmol/L (ref 135–145)
Total Bilirubin: 1 mg/dL (ref 0.3–1.2)
Total Protein: 6.9 g/dL (ref 6.5–8.1)

## 2020-06-04 LAB — HEMOGLOBIN AND HEMATOCRIT, BLOOD
HCT: 41.7 % (ref 39.0–52.0)
Hemoglobin: 13.3 g/dL (ref 13.0–17.0)

## 2020-06-04 LAB — POC OCCULT BLOOD, ED: Fecal Occult Bld: POSITIVE — AB

## 2020-06-04 MED ORDER — PANTOPRAZOLE SODIUM 40 MG IV SOLR
40.0000 mg | Freq: Once | INTRAVENOUS | Status: AC
  Administered 2020-06-04: 40 mg via INTRAVENOUS
  Filled 2020-06-04: qty 40

## 2020-06-04 MED ORDER — RENA-VITE PO TABS
1.0000 | ORAL_TABLET | Freq: Every day | ORAL | Status: DC
  Filled 2020-06-04: qty 1

## 2020-06-04 MED ORDER — HYDROCORTISONE (PERIANAL) 2.5 % EX CREA
TOPICAL_CREAM | Freq: Two times a day (BID) | CUTANEOUS | Status: DC
  Filled 2020-06-04: qty 28.35

## 2020-06-04 MED ORDER — LORAZEPAM 1 MG PO TABS
1.0000 mg | ORAL_TABLET | Freq: Three times a day (TID) | ORAL | Status: DC | PRN
  Administered 2020-06-04: 1 mg via ORAL
  Filled 2020-06-04: qty 1

## 2020-06-04 MED ORDER — ACETAMINOPHEN 325 MG PO TABS: 650.0000 mg | ORAL_TABLET | Freq: Four times a day (QID) | ORAL | Status: DC | PRN

## 2020-06-04 MED ORDER — CINACALCET HCL 30 MG PO TABS
30.0000 mg | ORAL_TABLET | Freq: Every day | ORAL | Status: DC
  Filled 2020-06-04: qty 1

## 2020-06-04 MED ORDER — FERRIC CITRATE 1 GM 210 MG(FE) PO TABS
840.0000 mg | ORAL_TABLET | Freq: Three times a day (TID) | ORAL | Status: DC
  Filled 2020-06-04: qty 4

## 2020-06-04 MED ORDER — METOPROLOL TARTRATE 25 MG PO TABS
25.0000 mg | ORAL_TABLET | Freq: Two times a day (BID) | ORAL | Status: DC
  Administered 2020-06-04: 25 mg via ORAL
  Filled 2020-06-04 (×2): qty 1

## 2020-06-04 MED ORDER — HYDROCORTISONE ACETATE 25 MG RE SUPP
25.0000 mg | Freq: Every day | RECTAL | Status: DC
  Administered 2020-06-04: 25 mg via RECTAL
  Filled 2020-06-04: qty 1

## 2020-06-04 MED ORDER — SODIUM CHLORIDE 0.9% FLUSH
3.0000 mL | Freq: Two times a day (BID) | INTRAVENOUS | Status: DC
  Administered 2020-06-04: 3 mL via INTRAVENOUS

## 2020-06-04 MED ORDER — METOPROLOL TARTRATE 5 MG/5ML IV SOLN: 5.0000 mg | Freq: Four times a day (QID) | INTRAVENOUS | Status: DC | PRN

## 2020-06-04 MED ORDER — HYDROXYZINE HCL 25 MG PO TABS
25.0000 mg | ORAL_TABLET | Freq: Every day | ORAL | Status: DC | PRN
  Administered 2020-06-04: 25 mg via ORAL
  Filled 2020-06-04: qty 1

## 2020-06-04 MED ORDER — ACETAMINOPHEN 650 MG RE SUPP: 650.0000 mg | Freq: Four times a day (QID) | RECTAL | Status: DC | PRN

## 2020-06-04 MED ORDER — PANTOPRAZOLE SODIUM 40 MG IV SOLR: 40.0000 mg | Freq: Two times a day (BID) | INTRAVENOUS | Status: DC

## 2020-06-04 MED ORDER — LORAZEPAM 2 MG/ML IJ SOLN
0.5000 mg | Freq: Once | INTRAMUSCULAR | Status: AC
  Administered 2020-06-04: 0.5 mg via INTRAVENOUS
  Filled 2020-06-04: qty 1

## 2020-06-04 MED ORDER — PANTOPRAZOLE SODIUM 40 MG IV SOLR
40.0000 mg | Freq: Two times a day (BID) | INTRAVENOUS | Status: DC
  Administered 2020-06-04: 40 mg via INTRAVENOUS

## 2020-06-04 NOTE — ED Notes (Signed)
Report called to Derrell RN, pt transported to the floor.

## 2020-06-04 NOTE — ED Triage Notes (Signed)
Patient c/o bright red rectal bleeding x 3 days. Patient states he just got out of dialysis today.  Patient denies any abdominal, rectal pain or N/V.

## 2020-06-04 NOTE — ED Notes (Signed)
Pt brought dinner tray

## 2020-06-04 NOTE — ED Notes (Signed)
Pt brought dinner tray (chicken salad and rice crispy treat) and states that he does not eat chicken.

## 2020-06-04 NOTE — H&P (Signed)
History and Physical        Hospital Admission Note Date: 06/04/2020  Patient name: [name] record number: [id number] Date of birth: 1961/02/16 Age: 59 y.o. Gender: male  PCP: Lorene Dy, MD  Patient coming from: home   Chief Complaint    Chief Complaint  Patient presents with  . Rectal Bleeding      HPI:   This is a 58 year old male with past medical history of ESRD on HD (MWF), diabetes, hypertension, hyperlipidemia, OSA, SVT, prostate cancer, prolonged QT who presented to the ED for evaluation of rectal bleeding x4 days.  Noted to be bright red blood.  Did have dialysis both Wednesday and this a.m. but states he didn't get heparin with it.  Last episode of BRBPR was about 1.5 hours prior to ED arrival.  Denies abdominal pain, rectal pain, nausea or vomiting.  Colonoscopy 3 years ago at [facility] was unremarkable per patient.  Denies NSAID use or other blood thinners.  Currently he he endorses significant anxiety over the loss of his wife several weeks ago and sudden death of his sister-in-law 2 days ago.  His anxiety also stems from his personal health issues.  Does not see psych for this.  Also admits to pruritus which she relates to his anxiety and ESRD.   ED Course: Afebrile, hemodynamically stable, on room air. Notable Labs: Sodium one thirty-seven, K3.6, glucose 120, BUN 17, creatinine 8.86, WBC 6.6, Hb 13.9, FOBT positive, COVID-19 swab pending.  Patient received Protonix 40 mg IV x1.  ED provider consulting with GI.    Vitals:   06/04/20 1324 06/04/20 1430  BP: (!) 145/99 130/75  Pulse: 93 92  Resp: 14 15  Temp:    SpO2: 100% 98%     Review of Systems:  Review of Systems  Gastrointestinal: Negative for abdominal pain, nausea and vomiting.  All other systems reviewed and are negative.   Medical/Social/Family History   Past Medical  History: Past Medical History:  Diagnosis Date  . Anemia 10/01/2015  . Arthritis    SPINE  . Borderline diabetes mellitus   . BPH (benign prostatic hypertrophy)   . Cancer ([place])   . CKD (chronic kidney disease), stage III ([place])   . Diabetes mellitus without complication (HCC)    borderline on no meds   . Dialysis patient ([place])   . Elevated PSA   . Gout    STABLE  PER PT 10-22-2013  . H/O hiatal hernia   . Hyperlipidemia   . Hypertension   . Microhematuria   . Nocturia   . OSA (obstructive sleep apnea)    PER PT STUDY DONE 2005 (APPROX).  NON- COMPLIANT CPAP  . SVT (supraventricular tachycardia) ([place])   . Wears glasses     Past Surgical History:  Procedure Laterality Date  . AV FISTULA PLACEMENT Left 08/17/2015   Procedure: BRACHIOCEPHALIC ARTERIOVENOUS (AV) FISTULA CREATION  left arm;  Surgeon: Conrad [place]th[place], MD;  Location: [place];  Service: Vascular;  Laterality: Left;  . 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 CARDIAC CATHETERIZATION  04-07-2002  DR Rollene Fare  NON-CRITICAL CAD/  PRESERVED LV  . CYSTOSCOPY W/ RETROGRADES Bilateral 10/27/2013   Procedure: CYSTOSCOPY WITH RETROGRADE PYELOGRAM;  Surgeon: Molli Hazard, MD;  [facility];  Service: Urology;  Laterality: Bilateral;  . LYMPHADENECTOMY Bilateral 12/18/2013   Procedure: PELVIC LYMPH NODE DISSECTION;  Surgeon: Alexis Frock, MD;  [facility];  Service: Urology;  Laterality: Bilateral;  . PROSTATE BIOPSY N/A 10/27/2013   Procedure: BIOPSY TRANSRECTAL ULTRASONIC PROSTATE (TUBP);  Surgeon: Molli Hazard, MD;  Location: [facility];  Service: Urology;  Laterality: N/A;  . PROSTATE SURGERY    . RIGHT HAND SURGERY  YRS AGO  . ROBOT ASSISTED LAPAROSCOPIC RADICAL PROSTATECTOMY N/A 12/18/2013   Procedure: ROBOTIC ASSISTED LAPAROSCOPIC RADICAL PROSTATECTOMY AND INDOCYANINE GREEN DYE;  Surgeon:  Alexis Frock, MD;  Location[facility];  Service: Urology;  Laterality: N/A;  . TRANSTHORACIC ECHOCARDIOGRAM  07-03-2006   MILD - MODERATE LVH/  EF 55-60%/  GRADE I DIASTOIC DYSFUNCTION/  TRIVIAL  TR /  TRIVIAL PERICARDIAL EFFUSION    Medications: Prior to Admission medications   Medication Sig Start Date End Date Taking? Authorizing Provider  AURYXIA 1 GM 210 MG(Fe) tablet Take 840 mg by mouth 3 (three) times daily with meals. 03/03/19   [provider]  cinacalcet (SENSIPAR) 30 MG tablet Take 60 mg by mouth daily. 11/14/19   [provider]  diltiazem (CARDIZEM CD) 180 MG 24 hr capsule Take 1 capsule (180 mg total) by mouth daily. Patient not taking: Reported on 01/14/2020 04/24/19   Hayden Rasmussen, MD  hydrOXYzine (ATARAX/VISTARIL) 25 MG tablet Take 25 mg by mouth daily as needed for itching. 09/15/19   [provider]  LORazepam (ATIVAN) 0.5 MG tablet Take 0.5 mg by mouth at bedtime as needed for anxiety or sleep.  12/31/16   [provider]  metoprolol tartrate (LOPRESSOR) 25 MG tablet Take 1 tablet (25 mg total) by mouth 2 (two) times daily. Patient taking differently: Take 25 mg by mouth daily. Original sig is 25mg  bid Pt is taking 25mg  qd 03/26/19   Ward, Delice Bison, DO  multivitamin (RENA-VIT) TABS tablet Take 1 tablet by mouth daily.    [provider]  sildenafil (VIAGRA) 50 MG tablet Take 50 mg by mouth daily as needed for erectile dysfunction. 01/25/18   [provider]    Allergies:   Allergies  Allergen Reactions  . Penicillins Nausea And Vomiting and Rash    Has patient had a PCN reaction causing immediate rash, facial/tongue/throat swelling, SOB or lightheadedness with hypotension: yes Has patient had a PCN reaction causing severe rash involving mucus membranes or skin necrosis: no Has patient had a PCN reaction that required hospitalization: no Has patient had a PCN reaction occurring within the last 10 years: No If all of  the above answers are "NO", then may proceed with Cephalosporin use.     Social History:  reports that he has never smoked. He has never used smokeless tobacco. He reports that he does not drink alcohol and does not use drugs.  Family History: Family History  Problem Relation Age of Onset  . Other Mother        shot and killed  . Cirrhosis Father      Objective   Physical Exam: Blood pressure 130/75, pulse 92, temperature 98.6 F (37 C), temperature source Oral, resp. rate 15, height 5\' 11"  (1.803 m), weight 92.5 kg, SpO2 98 %.  Physical Exam Vitals and nursing note reviewed.  Constitutional:      Appearance: Normal appearance.  HENT:     Head: Normocephalic and atraumatic.  Eyes:     Conjunctiva/sclera: Conjunctivae normal.  Cardiovascular:     Rate and Rhythm: Normal rate and regular rhythm.  Pulmonary:     Effort: Pulmonary effort is normal.     Breath sounds: Normal breath sounds.  Abdominal:     General: Abdomen is flat.     Palpations: Abdomen is soft.  Musculoskeletal:        General: No swelling or tenderness.  Skin:    Coloration: Skin is not jaundiced or pale.  Neurological:     Mental Status: He is alert. Mental status is at baseline.  Psychiatric:        Mood and Affect: Mood is anxious.     LABS on Admission: I have personally reviewed all the labs and imaging below    Basic Metabolic Panel: Recent Labs  Lab 06/04/20 1150  NA 137  K 3.6  CL 92*  CO2 32  GLUCOSE 120*  BUN 17  CREATININE 8.86*  CALCIUM 8.8*   Liver Function Tests: Recent Labs  Lab 06/04/20 1150  AST 11*  ALT 9  ALKPHOS 53  BILITOT 1.0  PROT 6.9  ALBUMIN 3.6   No results for input(s): LIPASE, AMYLASE in the last 168 hours. No results for input(s): AMMONIA in the last 168 hours. CBC: Recent Labs  Lab 06/04/20 1150  WBC 6.6  HGB 13.9  HCT 43.5  MCV 101.9*  PLT 183   Cardiac Enzymes: No results for input(s): CKTOTAL, CKMB, CKMBINDEX, TROPONINI in the last  168 hours. BNP: Invalid input(s): POCBNP CBG: No results for input(s): GLUCAP in the last 168 hours.  Radiological Exams on Admission:  No results found.    EKG: Not done   A & P   Principal Problem:   GI bleed Active Problems:   Essential hypertension, benign   ESRD (end stage renal disease) (HCC)   Anxiety   1. GI bleed a. Hemodynamically stable on room air with stable Hb b. Patient is to transfer to [facility] to be consulted by the ED c. Trend H/H d. Clear liquid diet and n.p.o. after midnight e. Protonix IV twice daily  2. ESRD on HD (MWF) a. Went for dialysis this a.m. b. Nephrology aware, formally consult on Sunday if patient is still hospitalized  3. Anxiety a. Related to the loss of family members as well as personal health issues as noted above b. Continue home hydroxyzine and Ativan as needed and give Ativan IV x1 now c. Recommend outpatient follow-up  4. Hypertension a. Continue home metoprolol 25 mg twice daily b. Holding Cardizem, was hypotensive initially which has since resolved monitor for now   DVT prophylaxis: SCDs   Code Status: Prior  Diet: Clear liquid diet, n.p.o. after midnight Family Communication: Admission, patients condition and plan of care including tests being ordered have been discussed with the patient who indicates understanding and agrees with the plan and Code Status.  Disposition Plan: The appropriate patient status for this patient is INPATIENT. Inpatient status is judged to be reasonable and necessary in order to provide the required intensity of service to ensure the patient's safety. The patient's presenting symptoms, physical exam findings, and initial radiographic and laboratory data in the context of their chronic comorbidities is felt to place them at high risk for further clinical deterioration. Furthermore, it is not anticipated that the patient will be  medically stable for discharge from the hospital within 2  midnights of admission. The following factors support the patient status of inpatient.   " The patient's presenting symptoms include GI bleed. " The worrisome physical exam findings include anxiety. " The initial radiographic and laboratory data are worrisome because of positive FOBT. " The chronic co-morbidities include ESRD on HD.   * I certify that at the point of admission it is my clinical judgment that the patient will require inpatient hospital care spanning beyond 2 midnights from the point of admission due to high intensity of service, high risk for further deterioration and high frequency of surveillance required.*   Status is: Inpatient  Remains inpatient appropriate because:IV treatments appropriate due to intensity of illness or inability to take PO and Inpatient level of care appropriate due to severity of illness   Dispo: The patient is from: Home              Anticipated d/c is to: Home              Anticipated d/c date is: 2 days              Patient currently is not medically stable to d/c.       Consultants  . GI . Discussed with nephrology  Procedures  . None  Time Spent on Admission: 63 minutes    Harold Hedge, DO [facility]  06/04/2020, 3:06 PM

## 2020-06-04 NOTE — ED Provider Notes (Signed)
[facility] DEPT Provider Note   CSN: [id number] Arrival date & time: 06/04/20  1103     History Chief Complaint  Patient presents with  . Rectal Bleeding    [name] is a 59 y.o. male possible history of ESRD (MWF Dialysis) BPH, CKD, diabetes, hyperlipidemia, hypertension who presents for evaluation of rectal bleeding x 4 days.  Patient reports that 4 days ago, he started having blood with bowel movements.  He states he has had some episodes of bright red blood noted with his bowel movements.  He states that he did dialysis both Wednesday and today but did not get the heparin with it.  He states that he felt like it was getting slightly worse.  His last episode was about an hour and a half prior to ED arrival and he states that there was a large amount of blood noted in the stool.  He states that he has not had a strain.  He denies any associated abdominal pain, rectal pain, nausea/vomiting.  He had a colonoscopy 3 years ago done with [facility] and states that it was normal.  He has not any issue since then.  He denies any chest pain, difficulty breathing.  He does not drink any alcohol.  He does not smoke.  Denies any frequent NSAID use.  No other blood thinners.  The history is provided by the patient.       Past Medical History:  Diagnosis Date  . Anemia 10/01/2015  . Arthritis    SPINE  . Borderline diabetes mellitus   . BPH (benign prostatic hypertrophy)   . Cancer ([place])   . CKD (chronic kidney disease), stage III ([place])   . Diabetes mellitus without complication (HCC)    borderline on no meds   . Dialysis patient ([place])   . Elevated PSA   . Gout    STABLE  PER PT 10-22-2013  . H/O hiatal hernia   . Hyperlipidemia   . Hypertension   . Microhematuria   . Nocturia   . OSA (obstructive sleep apnea)    PER PT STUDY DONE 2005 (APPROX).  NON- COMPLIANT CPAP  . SVT (supraventricular tachycardia) ([place])   . Wears glasses     Patient  Active Problem List   Diagnosis Date Noted  . GI bleed 06/04/2020  . ESRD (end stage renal disease) ([place]) 06/04/2020  . Anxiety 06/04/2020  . Complication of vascular access for dialysis 09/23/2019  . Pulmonary edema 05/31/2019  . Symptomatic anemia   . CKD (chronic kidney disease)   . Hemoptysis 10/01/2015  . Anemia 10/01/2015  . Unintentional weight loss 10/01/2015  . OSA (obstructive sleep apnea)   . Baker's cyst of knee   . Joint effusion, knee   . Effusion of right knee 09/21/2014  . Epididymitis 09/21/2014  . Prostate cancer ([place]) 12/18/2013  . Weakness generalized 05/30/2013  . Prolonged QT interval 05/30/2013  . Other and unspecified hyperlipidemia 05/30/2013  . Essential hypertension, benign 05/30/2013  . BPH (benign prostatic hypertrophy) 05/30/2013  . Acute gout 05/30/2013  . CKD (chronic kidney disease) stage 3, GFR 30-59 ml/min ([place]) 05/30/2013    Past Surgical History:  Procedure Laterality Date  . AV FISTULA PLACEMENT Left 08/17/2015   Procedure: BRACHIOCEPHALIC ARTERIOVENOUS (AV) FISTULA CREATION  left arm;  Surgeon: Conrad Fairview, MD;  Location: [facility];  Service: Vascular;  Laterality: Left;  . 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 CARDIAC CATHETERIZATION  04-07-2002  DR [facility]   NON-CRITICAL CAD/  PRESERVED LV  . CYSTOSCOPY W/ RETROGRADES Bilateral 10/27/2013   Procedure: CYSTOSCOPY WITH RETROGRADE PYELOGRAM;  Surgeon: Molli Hazard, MD;  [facility];  Service: Urology;  Laterality: Bilateral;  . LYMPHADENECTOMY Bilateral 12/18/2013   Procedure: PELVIC LYMPH NODE DISSECTION;  Surgeon: Alexis Frock, MD;  Location: [facility];  Service: Urology;  Laterality: Bilateral;  . PROSTATE BIOPSY N/A 10/27/2013   Procedure: BIOPSY TRANSRECTAL ULTRASONIC PROSTATE (TUBP);  Surgeon: Molli Hazard, MD;  Location: [facility];  Service:  Urology;  Laterality: N/A;  . PROSTATE SURGERY    . RIGHT HAND SURGERY  YRS AGO  . ROBOT ASSISTED LAPAROSCOPIC RADICAL PROSTATECTOMY N/A 12/18/2013   Procedure: ROBOTIC ASSISTED LAPAROSCOPIC RADICAL PROSTATECTOMY AND INDOCYANINE GREEN DYE;  Surgeon: Alexis Frock, MD;  Location: [facility];  Service: Urology;  Laterality: N/A;  . TRANSTHORACIC ECHOCARDIOGRAM  07-03-2006   MILD - MODERATE LVH/  EF 55-60%/  GRADE I DIASTOIC DYSFUNCTION/  TRIVIAL  TR /  TRIVIAL PERICARDIAL EFFUSION       Family History  Problem Relation Age of Onset  . Other Mother        shot and killed  . Cirrhosis Father     Social History   Tobacco Use  . Smoking status: Never Smoker  . Smokeless tobacco: Never Used  Vaping Use  . Vaping Use: Never used  Substance Use Topics  . Alcohol use: No  . Drug use: No    Home Medications Prior to Admission medications   Medication Sig Start Date End Date Taking? Authorizing Provider  acetaminophen (TYLENOL) 500 MG tablet Take 1,000 mg by mouth every 6 (six) hours as needed for moderate pain.   Yes [provider]  AURYXIA 1 GM 210 MG(Fe) tablet Take 840 mg by mouth 3 (three) times daily with meals. 03/03/19  Yes [provider]  cinacalcet (SENSIPAR) 30 MG tablet Take 30 mg by mouth daily. 11/14/19  Yes [provider]  hydrOXYzine (ATARAX/VISTARIL) 25 MG tablet Take 25 mg by mouth daily as needed for itching or anxiety. 09/15/19  Yes [provider]  LORazepam (ATIVAN) 1 MG tablet Take 1 mg by mouth every 8 (eight) hours as needed for anxiety.   Yes [provider]  multivitamin (RENA-VIT) TABS tablet Take 1 tablet by mouth daily.   Yes [provider]  triamcinolone (KENALOG) 0.1 % Apply 1 application topically 2 (two) times daily. 03/15/20  Yes [provider]  diltiazem (CARDIZEM CD) 180 MG 24 hr capsule Take 1 capsule (180 mg total) by mouth daily. Patient not taking: No sig reported 04/24/19   Hayden Rasmussen, MD  metoprolol tartrate (LOPRESSOR) 25 MG tablet Take 1 tablet (25 mg total) by mouth 2 (two) times daily. Patient not taking: Reported on 06/04/2020 03/26/19   Ward, Delice Bison, DO    Allergies    Penicillins  Review of Systems   Review of Systems  Constitutional: Negative for fever.  Respiratory: Negative for cough and shortness of breath.   Cardiovascular: Negative for chest pain.  Gastrointestinal: Positive for blood in stool. Negative for abdominal pain, anal bleeding, nausea and vomiting.  Genitourinary: Negative for dysuria and hematuria.  Neurological: Negative for headaches.  All other systems reviewed and are negative.   Physical Exam Updated Vital Signs BP (!) 137/103   Pulse 87   Temp  98.6 F (37 C) (Oral)   Resp 15   Ht 5\' 11"  (1.803 m)   Wt 92.5 kg   SpO2 99%   BMI 28.45 kg/m   Physical Exam Vitals and nursing note reviewed.  Constitutional:      Appearance: Normal appearance. He is well-developed and well-nourished.  HENT:     Head: Normocephalic and atraumatic.     Mouth/Throat:     Mouth: Oropharynx is clear and moist and mucous membranes are normal.  Eyes:     General: Lids are normal.     Extraocular Movements: EOM normal.     Conjunctiva/sclera: Conjunctivae normal.     Pupils: Pupils are equal, round, and reactive to light.  Cardiovascular:     Rate and Rhythm: Normal rate and regular rhythm.     Pulses: Normal pulses.     Heart sounds: Normal heart sounds. No murmur heard. No friction rub. No gallop.   Pulmonary:     Effort: Pulmonary effort is normal.     Breath sounds: Normal breath sounds.  Abdominal:     Palpations: Abdomen is soft. Abdomen is not rigid.     Tenderness: There is no abdominal tenderness. There is no guarding.     Comments: Abdomen is soft, non-distended, non-tender. No rigidity, No guarding. No peritoneal signs.  Genitourinary:    Comments: The exam was performed with a chaperone present Thurmond Butts, Therapist, sports). Small thrombosed  external hemorrhoid noted to the 10 o clock region. Dark melena noted on DR. No bright red blood.  Musculoskeletal:        General: Normal range of motion.     Cervical back: Full passive range of motion without pain.  Skin:    General: Skin is warm and dry.     Capillary Refill: Capillary refill takes less than 2 seconds.  Neurological:     Mental Status: He is alert and oriented to person, place, and time.  Psychiatric:        Mood and Affect: Mood and affect normal.        Speech: Speech normal.     ED Results / Procedures / Treatments   Labs (all labs ordered are listed, but only abnormal results are displayed) Labs Reviewed  COMPREHENSIVE METABOLIC PANEL - Abnormal; Notable for the following components:      Result Value   Chloride 92 (*)    Glucose, Bld 120 (*)    Creatinine, Ser 8.86 (*)    Calcium 8.8 (*)    AST 11 (*)    GFR, Estimated 6 (*)    All other components within normal limits  CBC - Abnormal; Notable for the following components:   MCV 101.9 (*)    RDW 16.1 (*)    All other components within normal limits  POC OCCULT BLOOD, ED - Abnormal; Notable for the following components:   Fecal Occult Bld POSITIVE (*)    All other components within normal limits  RESP PANEL BY RT-PCR (FLU A&B, COVID) ARPGX2  HEMOGLOBIN AND HEMATOCRIT, BLOOD  HEMOGLOBIN AND HEMATOCRIT, BLOOD  TYPE AND SCREEN    EKG None  Radiology No results found.  Procedures Procedures (including critical care time)  Medications Ordered in ED Medications  pantoprazole (PROTONIX) injection 40 mg (has no administration in time range)  metoprolol tartrate (LOPRESSOR) tablet 25 mg (has no administration in time range)  hydrocortisone (ANUSOL-HC) suppository 25 mg (has no administration in time range)  pantoprazole (PROTONIX) injection 40 mg (40 mg Intravenous Given 06/04/20  1337)  LORazepam (ATIVAN) injection 0.5 mg (0.5 mg Intravenous Given 06/04/20 1444)    ED Course  I have reviewed  the triage vital signs and the nursing notes.  Pertinent labs & imaging results that were available during my care of the patient were reviewed by me and considered in my medical decision making (see chart for details).    MDM Rules/Calculators/A&P                          59 year old male who presents for evaluation of rectal bleeding x4 days.  He states that it worsened this morning.  He states that this morning about an hour and half prior to ED arrival, he had a large episode.  He has not had any abdominal pain.  On initial arrival, he is afebrile, nontoxic-appearing.  He is hemodynamically stable.  On exam, he has a benign abdominal exam.  On digital rectal exam, he does have some black tarry stool concerning for melena.  He is not on any blood thinners.  He does have a history of dialysis but states for his last 2 sessions, he has not had any heparin.  He is followed with GI at [facility] and had a colonoscopy about 3 years ago and states that it was normal.  Concern for GI bleed.  Plan to check labs to evaluate for possible transfusion.  Fecal occult is positive. CMP shows BUN of 17, Cr 8.86. CBC shows no leukocytosis. Hgb is stable at 13.9.   At this time, patient is fecal occult positive.  His hemoglobin is stable now but concerned about the worsening bleeding in this significant comorbidities. He has no abdominal tenderness.  We will plan for admission.  I secured messaged with Dr. Cristina Gong [facility]). Given that patient is dialysis patient will most likely require transfer to [facility] for dialysis management, he request that low-power GI NP involves that they could potentially take over his care given that he would then be considered unassigned. I will message them.  Discussed with hospitalist who plans for admission. Plans patient will probably get transferred over to [facility] either tonight or tomorrow morning.  I secure messaged Dr. Silverio Decamp ([facility]) regarding patient and plans for  transfer to [facility]. She request that since [facility] was the initial [facility] unassigned that they continue to management care as patient gets transferred.  I have updated Dr. Cristina Gong. He will plan to see the patient.  Secure message with Dr. Silverio Decamp ([facility]). [facility] PA has seen patient. They will plan to coordinate GI care and continue consulting on patient. Dr. Cristina Gong has been udpated.   Portions of this note were generated with L[employer]. Dictation errors may occur despite best attempts at proofreading.  Final Clinical Impression(s) / ED Diagnoses Final diagnoses:  Melena    Rx / DC Orders ED Discharge Orders    None       Desma Mcgregor 06/04/20 1609    Lacretia Leigh, MD 06/05/20 1323

## 2020-06-04 NOTE — ED Provider Notes (Signed)
Medical screening examination/treatment/procedure(s) were conducted as a shared visit with non-physician practitioner(s) and myself.  I personally evaluated the patient during the encounter.     59 year old male presents with rectal bleeding x1 day.  Hemoglobin stable here.  Will require admission for further work-up of his GI bleed.   Lacretia Leigh, MD 06/04/20 1308

## 2020-06-04 NOTE — Progress Notes (Signed)
Chaplain engaged in initial visit with [name].  [name] shared that he lost his wife a month ago as she suffered from various health challenges.  He also shared that his sister-in-law recently passed from a tragic accident.  [name]'s life has been filled with some grief and loss, as he also expressed his life being consumed by dialysis and his own health complications.  [name] expressed that he has spent his life working and now finds himself in a place of not being able to enjoy the fruits of his labor.  He voiced that he is not a happy person right now.  During visit, [name] also kept twisting and turning in the bed.  [name] conveyed that he struggles with anxiety and also finds it hard to see things that he may deem as "nasty."  To not be in a familiar space that he has looked after and cleaned is hard for [name].  He wanted chaplain to talk to his nurse about obtaining medicine to sleep and stop itching, and before chaplain left the pharmacy tech arrived.  She explained that she was there to go over his medicines and would reach out to the physician.    Chaplain offered support and ministries of listening and presence.  [name] was able to convey that he believes there will be a time in which he will feel happy again.  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   Chaplain will follow-up.    06/04/20 1400  Clinical Encounter Type  Visited With Patient  Visit Type Initial;Social support  Referral From Nurse  Consult/Referral To Chaplain  Spiritual Encounters  Spiritual Needs Grief support

## 2020-06-04 NOTE — Consult Note (Addendum)
Referring Provider: Dr. Marva Panda  Primary Care Physician:  Lorene Dy, MD Primary Gastroenterologist: Previously Dr. Angela Nevin, unassigned to transfer to [facility]  Reason for Consultation: Rectal bleeding  HPI: [name] is a 59 y.o. male with a past medical history of arthritis, hypertension, hyperlipidemia, obstructive sleep apnea not use CPAP, diabetes mellitus type 2, prostate cancer status post radical prostatectomy, anemia, end-stage renal disease on hemodialysis M/W/F. He is currently undergoing evaluation at [facility] for a kidney transplant.  He presented to [facility] long hospital emergency room earlier today with complaints of rectal bleeding for 3 days.  He awakened this morning and passed a normal formed brown bowel movement with a moderate amount of red blood on the stool and on the toilet tissue.  He passed a second similar bowel movement with blood within the next hour.  He went to dialysis as scheduled.  When he returned home, he passed a third stool with red blood associated with rectal pain.  He denies having any constipation or straining.  He denies taking any blood thinners.  He most likely has Heparin through his dialyzer during his dialysis sessions.  Denies any prior history of GI bleeding.  He reports having a history of colon polyps for which he underwent a colonoscopy every 3 to 4 years by Dr. Earlean Shawl.  His most recent colonoscopy was 10/31/2016 and 1 tubular adenomatous polyp was removed from the right colon.  A repeat colonoscopy in 5 years was recommended.  His maternal uncle had colon cancer.  He is s/p radical prostatectomy secondary to prostate cancer without radiation in 2015.  He denies having any abdominal pain.  No GERD symptoms.  No chest pain or palpitations.  He complains of having a congested cough for the past 3 days.  No fever or shortness of breath.  Labs in the ED showed a WBC 6.6.  Hemoglobin 13.9 (which  is his baseline  level).  Hematocrit 43.5.  MCV 101.9.  Platelet 183.  BUN 17.  Creatinine 8.86.  Alk phos 53.  Total bili 1.0.  AST 11.  ALT 9.  Sodium 137.  Potassium 3.6.  FOBT positive.  SARS coronavirus result pending.  He will transfer to [facility] as he is a dialysis patient.  He underwent a colonoscopy 10/31/2016 Dr. Earlean Shawl which identified a tubular adenomatous polyp which was removed from the right colon.  A repeat colonoscopy in 5 years was recommended.   Past Medical History:  Diagnosis Date  . Anemia 10/01/2015  . Arthritis    SPINE  . Borderline diabetes mellitus   . BPH (benign prostatic hypertrophy)   . Cancer ([place])   . CKD (chronic kidney disease), stage III ([place])   . Diabetes mellitus without complication (HCC)    borderline on no meds   . Dialysis patient ([place])   . Elevated PSA   . Gout    STABLE  PER PT 10-22-2013  . H/O hiatal hernia   . Hyperlipidemia   . Hypertension   . Microhematuria   . Nocturia   . OSA (obstructive sleep apnea)    PER PT STUDY DONE 2005 (APPROX).  NON- COMPLIANT CPAP  . SVT (supraventricular tachycardia) ([place])   . Wears glasses     Past Surgical History:  Procedure Laterality Date  . AV FISTULA PLACEMENT Left 08/17/2015   Procedure: BRACHIOCEPHALIC ARTERIOVENOUS (AV) FISTULA CREATION  left arm;  Surgeon: Conrad Madrone, MD;  Location: [place];  Service:  Vascular;  Laterality: Left;  . 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 CARDIAC CATHETERIZATION  04-07-2002  DR [facility]   NON-CRITICAL CAD/  PRESERVED LV  . CYSTOSCOPY W/ RETROGRADES Bilateral 10/27/2013   Procedure: CYSTOSCOPY WITH RETROGRADE PYELOGRAM;  Surgeon: Molli Hazard, MD;  Location: [facility];  Service: Urology;  Laterality: Bilateral;  . LYMPHADENECTOMY Bilateral 12/18/2013   Procedure: PELVIC LYMPH NODE DISSECTION;  Surgeon: Alexis Frock, MD;  Location: [facility];  Service:  Urology;  Laterality: Bilateral;  . PROSTATE BIOPSY N/A 10/27/2013   Procedure: BIOPSY TRANSRECTAL ULTRASONIC PROSTATE (TUBP);  Surgeon: Molli Hazard, MD;  Location: [facility];  Service: Urology;  Laterality: N/A;  . PROSTATE SURGERY    . RIGHT HAND SURGERY  YRS AGO  . ROBOT ASSISTED LAPAROSCOPIC RADICAL PROSTATECTOMY N/A 12/18/2013   Procedure: ROBOTIC ASSISTED LAPAROSCOPIC RADICAL PROSTATECTOMY AND INDOCYANINE GREEN DYE;  Surgeon: Alexis Frock, MD;  Location: [facility];  Service: Urology;  Laterality: N/A;  . TRANSTHORACIC ECHOCARDIOGRAM  07-03-2006   MILD - MODERATE LVH/  EF 55-60%/  GRADE I DIASTOIC DYSFUNCTION/  TRIVIAL  TR /  TRIVIAL PERICARDIAL EFFUSION    Prior to Admission medications   Medication Sig Start Date End Date Taking? Authorizing Provider  acetaminophen (TYLENOL) 500 MG tablet Take 1,000 mg by mouth every 6 (six) hours as needed for moderate pain.   Yes [provider]  AURYXIA 1 GM 210 MG(Fe) tablet Take 840 mg by mouth 3 (three) times daily with meals. 03/03/19  Yes [provider]  cinacalcet (SENSIPAR) 30 MG tablet Take 30 mg by mouth daily. 11/14/19  Yes [provider]  hydrOXYzine (ATARAX/VISTARIL) 25 MG tablet Take 25 mg by mouth daily as needed for itching or anxiety. 09/15/19  Yes [provider]  LORazepam (ATIVAN) 1 MG tablet Take 1 mg by mouth every 8 (eight) hours as needed for anxiety.   Yes [provider]  multivitamin (RENA-VIT) TABS tablet Take 1 tablet by mouth daily.   Yes [provider]  triamcinolone (KENALOG) 0.1 % Apply 1 application topically 2 (two) times daily. 03/15/20  Yes [provider]  diltiazem (CARDIZEM CD) 180 MG 24 hr capsule Take 1 capsule (180 mg total) by mouth daily. Patient not taking: No sig reported 04/24/19   Hayden Rasmussen, MD  metoprolol tartrate (LOPRESSOR) 25 MG tablet Take 1 tablet (25 mg total) by mouth 2 (two) times daily. Patient not  taking: Reported on 06/04/2020 03/26/19   Ward, Delice Bison, DO    No current facility-administered medications for this encounter.   Current Outpatient Medications  Medication Sig Dispense Refill  . acetaminophen (TYLENOL) 500 MG tablet Take 1,000 mg by mouth every 6 (six) hours as needed for moderate pain.    Lorin Picket 1 GM 210 MG(Fe) tablet Take 840 mg by mouth 3 (three) times daily with meals.    . cinacalcet (SENSIPAR) 30 MG tablet Take 30 mg by mouth daily.    . hydrOXYzine (ATARAX/VISTARIL) 25 MG tablet Take 25 mg by mouth daily as needed for itching or anxiety.    Marland Kitchen LORazepam (ATIVAN) 1 MG tablet Take 1 mg by mouth every 8 (eight) hours as needed for anxiety.    . multivitamin (RENA-VIT) TABS tablet Take 1 tablet by mouth daily.    Marland Kitchen triamcinolone (KENALOG) 0.1 % Apply 1 application topically 2 (two) times daily.    Marland Kitchen  diltiazem (CARDIZEM CD) 180 MG 24 hr capsule Take 1 capsule (180 mg total) by mouth daily. (Patient not taking: No sig reported) 30 capsule 0  . metoprolol tartrate (LOPRESSOR) 25 MG tablet Take 1 tablet (25 mg total) by mouth 2 (two) times daily. (Patient not taking: Reported on 06/04/2020) 60 tablet 1    Allergies as of 06/04/2020 - Review Complete 06/04/2020  Allergen Reaction Noted  . Penicillins Nausea And Vomiting and Rash 09/07/2011    Family History  Problem Relation Age of Onset  . Other Mother        shot and killed  . Cirrhosis Father     Social History   Socioeconomic History  . Marital status: Widowed    Spouse name: Not on file  . Number of children: Not on file  . Years of education: Not on file  . Highest education level: Not on file  Occupational History  . Not on file  Tobacco Use  . Smoking status: Never Smoker  . Smokeless tobacco: Never Used  Vaping Use  . Vaping Use: Never used  Substance and Sexual Activity  . Alcohol use: No  . Drug use: No  . Sexual activity: Not on file  Other Topics Concern  . Not on file  Social History  Narrative  . Not on file   Social Determinants of Health   Financial Resource Strain: Not on file  Food Insecurity: Not on file  Transportation Needs: Not on file  Physical Activity: Not on file  Stress: Not on file  Social Connections: Not on file  Intimate Partner Violence: Not on file    Review of Systems: Gen: Denies fever, sweats or chills. No weight loss.  CV: Denies chest pain, palpitations or edema. Resp: Somewhat congested cough for the past few days.  No shortness of breath. GI: See HPI.   GU : + anuric on HD. MS: Denies joint pain, muscles aches or weakness. Derm: Denies rash, itchiness, skin lesions or unhealing ulcers. Psych: Denies depression, anxiety or memory loss. Heme: Denies easy bruising, bleeding. Neuro:  Denies headaches, dizziness or paresthesias. Endo:  + DM.  Physical Exam: Vital signs in last 24 hours: Temp:  [98.6 F (37 C)] 98.6 F (37 C) (12/17 1110) Pulse Rate:  [91-107] 92 (12/17 1430) Resp:  [14-18] 15 (12/17 1430) BP: (98-145)/(67-99) 130/75 (12/17 1430) SpO2:  [94 %-100 %] 98 % (12/17 1430) Weight:  [92.5 kg] 92.5 kg (12/17 1111)   General:  Alert, fatigued appearing 59 year old male in no acute distress. Head:  Normocephalic and atraumatic. Eyes:  No scleral icterus.  Cataracts present.  Conjunctiva pink. Ears:  Normal auditory acuity. Nose:  No deformity, discharge or lesions. Mouth: Poor dentition.  No ulcers or lesions.  Neck:  Supple. No lymphadenopathy or thyromegaly.  Lungs: Breath sounds clear throughout. Heart: Regular rate and rhythm, no murmurs. Abdomen: Soft, nondistended.  Nontender.  Positive bowel sounds to all 4 quadrants. Rectal: Inflamed and white friable left external anal hemorrhoid, tender without active bleeding.  Moderate tenderness on internal rectal exam.  No fissure or mass assessed.  CMA Caryl Pina present during exam. Musculoskeletal:  Symmetrical without gross deformities.  Pulses:  Normal pulses  noted. Extremities:  Without clubbing or edema. Neurologic:  Alert and  oriented x4. No focal deficits.  Skin:  Intact without significant lesions or rashes. Psych:  Alert and cooperative. Normal mood and affect.  Intake/Output from previous day: No intake/output data recorded. Intake/Output this shift: No intake/output data recorded.  Lab Results: Recent Labs    06/04/20 1150  WBC 6.6  HGB 13.9  HCT 43.5  PLT 183   BMET Recent Labs    06/04/20 1150  NA 137  K 3.6  CL 92*  CO2 32  GLUCOSE 120*  BUN 17  CREATININE 8.86*  CALCIUM 8.8*   LFT Recent Labs    06/04/20 1150  PROT 6.9  ALBUMIN 3.6  AST 11*  ALT 9  ALKPHOS 53  BILITOT 1.0   PT/INR No results for input(s): LABPROT, INR in the last 72 hours. Hepatitis Panel No results for input(s): HEPBSAG, HCVAB, HEPAIGM, HEPBIGM in the last 72 hours.    Studies/Results: No results found.  IMPRESSION/PLAN:  98.  59 year old male with a past medical history of tubular adenomatous colon polyp presents to the ED with rectal bleeding and rectal pain.  Rectal exam showed an inflamed friable left anal external hemorrhoid.  His hemoglobin is at his baseline level of 13.9.  He is hemodynamically stable. -Clear liquid diet -Anusol suppository 25 mg 1 PR nightly x3 nights -Colonoscopy as an inpatient versus outpatient  2.  End-stage renal disease on hemodialysis, currently undergoing evaluation for future kidney transplant  3.  History of prostate cancer status post radical prostatectomy in 2015  4. DM II    Noralyn Pick  06/04/2020, 2:51 PM    Attending physician's note   I have taken a history, examined the patient and reviewed the chart. I agree with the Advanced Practitioner's note, impression and recommendations.  59 year old male with OSA on CPAP, type 2 diabetes, prostate cancer s/p prostatectomy, end-stage renal disease on hemodialysis undergoing evaluation for kidney transplant presented with  small-volume rectal bleeding and symptomatic hemorrhoids  Rectal bleeding likely etiology is symptomatic hemorrhoids He is hemodynamically stable and no significant drop in hemoglobin  Last colonoscopy May 2018 with removal of 1 tubular adenoma, he is due for repeat colonoscopy in May 2023  Patient feels well and he would like to go home, okay to discharge home from GI standpoint  Apply small pea-sized amount of hydro-cortisone cream per rectum twice daily for 7 days Sitz bath Benefiber 1 tablespoon 3 times daily with meals  Follow-up with outpatient GI Dr. Earlean Shawl, consider colonoscopy as outpatient if he continues to have persistent rectal bleeding  . The patient was provided an opportunity to ask questions and all were answered. The patient agreed with the plan and demonstrated an understanding of the instructions.  Damaris Hippo , MD [phone]

## 2020-06-05 DIAGNOSIS — N186 End stage renal disease: Secondary | ICD-10-CM | POA: Diagnosis not present

## 2020-06-05 DIAGNOSIS — K644 Residual hemorrhoidal skin tags: Secondary | ICD-10-CM | POA: Diagnosis not present

## 2020-06-05 DIAGNOSIS — F419 Anxiety disorder, unspecified: Secondary | ICD-10-CM | POA: Diagnosis not present

## 2020-06-05 DIAGNOSIS — I10 Essential (primary) hypertension: Secondary | ICD-10-CM

## 2020-06-05 LAB — BASIC METABOLIC PANEL
Anion gap: 19 — ABNORMAL HIGH (ref 5–15)
BUN: 20 mg/dL (ref 6–20)
CO2: 27 mmol/L (ref 22–32)
Calcium: 8.8 mg/dL — ABNORMAL LOW (ref 8.9–10.3)
Chloride: 93 mmol/L — ABNORMAL LOW (ref 98–111)
Creatinine, Ser: 10.11 mg/dL — ABNORMAL HIGH (ref 0.61–1.24)
GFR, Estimated: 5 mL/min — ABNORMAL LOW (ref >60)
Glucose, Bld: 80 mg/dL (ref 70–99)
Potassium: 4.1 mmol/L (ref 3.5–5.1)
Sodium: 139 mmol/L (ref 135–145)

## 2020-06-05 LAB — CBC
HCT: 43.6 % (ref 39.0–52.0)
Hemoglobin: 13.6 g/dL (ref 13.0–17.0)
MCH: 32.5 pg (ref 26.0–34.0)
MCHC: 31.2 g/dL (ref 30.0–36.0)
MCV: 104.3 fL — ABNORMAL HIGH (ref 80.0–100.0)
Platelets: 186 10*3/uL (ref 150–400)
RBC: 4.18 MIL/uL — ABNORMAL LOW (ref 4.22–5.81)
RDW: 16 % — ABNORMAL HIGH (ref 11.5–15.5)
WBC: 6.4 10*3/uL (ref 4.0–10.5)
nRBC: 0 % (ref 0.0–0.2)

## 2020-06-05 MED ORDER — PANTOPRAZOLE SODIUM 40 MG PO TBEC: 40.0000 mg | DELAYED_RELEASE_TABLET | Freq: Every day | ORAL | 0 refills | Status: DC

## 2020-06-05 MED ORDER — HYDROXYZINE HCL 25 MG PO TABS: 25.0000 mg | ORAL_TABLET | Freq: Four times a day (QID) | ORAL | 0 refills | Status: DC | PRN

## 2020-06-05 MED ORDER — MELATONIN 5 MG PO TABS
5.0000 mg | ORAL_TABLET | Freq: Every evening | ORAL | 0 refills | Status: DC | PRN
Start: 2020-06-05 — End: 2022-11-14

## 2020-06-05 MED ORDER — HYDROCORTISONE (PERIANAL) 2.5 % EX CREA: TOPICAL_CREAM | Freq: Two times a day (BID) | CUTANEOUS | 0 refills | Status: DC

## 2020-06-05 NOTE — Discharge Summary (Signed)
Physician Discharge Summary  [name] KGU:[id number] DOB: 1961/01/11 DOA: 06/04/2020  PCP: Lorene Dy, MD  Admit date: 06/04/2020 Discharge date: 06/05/2020  Admitted From: Home  Discharge disposition:  Home   Recommendations for Outpatient Follow-Up:   . Follow up with your primary care provider in one week.  . Check CBC, BMP, magnesium in the next visit . Follow-up with outpatient GI Dr. Earlean Shawl, consider colonoscopy as outpatient if he continues to have persistent rectal bleeding  Discharge Diagnosis:   Principal Problem:   GI bleed Active Problems:   Essential hypertension, benign   ESRD (end stage renal disease) ([place])   Anxiety   Discharge Condition: Improved.  Diet recommendation: Low sodium, heart healthy.    Wound care: None.  Code status: Full.   History of Present Illness:   This is a 59 year old male with past medical history of ESRD on HD (MWF), diabetes mellitus type II, hypertension, hyperlipidemia, OSA on CPAP, SVT, prostate cancer status post prostatectomy, prolonged QT presented to hospital with complaints of rectal bleeding for 4 days prior to presentation.  Denied abdominal pain, rectal pain, nausea or vomiting. Colonoscopy 3 years ago at [facility] was unremarkable per patient. Denied NSAID use or other blood thinners. Patient also reportedsignificant anxiety over the loss of his wife several weeks ago and sudden death of his sister-in-law 2 days ago.  In the ED, patient was noted to beafebrile, hemodynamically stable, on room air. Notable Labs:Sodium 137,  K3.6, glucose120, BUN17, creatinine 8.86, WBC 6.6, Hb 13.9, FOBT positive, COVID-19 negative.Patient received Protonix 40 mg IV x1. ED provider consulted with GI and patient was considered for admission.   Hospital Course:   Following conditions were addressed during hospitalization as listed below,  GI bleed, likely hemorrhoidal bleed Patient was hemodynamically stable  with stable hemoglobin.  GI was consulted and recommended outpatient follow-up with GI.  GI has an impression of possible hemorrhage.  We'll continue hemorrhoidal cream on discharge.  Hemoglobin has remained stable overnight.  Patient has not had any further episodes of bleeding.  Will recommend Protonix on discharge.  ESRD on HD (MWF) Status post hemodialysis yesterday.  Hemodialysis on Monday.  Anxiety Situational. Recent loss of family members. Continue hydroxyzine Ativan.  Hypertension Continue home metoprolol 25 mg twice daily.   Disposition.  At this time, patient is stable for disposition. Seen by GI during hospitalization recommend outpatient colonoscopy.  Medical Consultants:   GI Procedures:    None Subjective:   Today, patient was seen and examined at bedside. Denies any nausea vomiting or abdominal pain. Feels anxious and was not able to sleep at nighttime. Denies any bowel movement. No rectal bleeding.  Discharge Exam:   Vitals:   06/05/20 0229 06/05/20 0615  BP: (!) 127/97 128/72  Pulse: 76 89  Resp: 18 16  Temp: 98.2 F (36.8 C) 98.9 F (37.2 C)  SpO2: 98% 96%   Vitals:   06/04/20 1930 06/04/20 2036 06/05/20 0229 06/05/20 0615  BP: 132/83 128/90 (!) 127/97 128/72  Pulse: 79 87 76 89  Resp: 19 16 18 16   Temp:  98.6 F (37 C) 98.2 F (36.8 C) 98.9 F (37.2 C)  TempSrc:  Oral Oral Oral  SpO2: 95% 100% 98% 96%  Weight:      Height:       General: Alert awake, not in obvious distress, mildly anxious HENT: pupils equally reacting to light,  No scleral pallor or icterus noted. Oral mucosa is moist.  Chest:  Clear  breath sounds.  Diminished breath sounds bilaterally. No crackles or wheezes.  CVS: S1 &S2 heard. No murmur.  Regular rate and rhythm. Abdomen: Soft, nontender, nondistended.  Bowel sounds are heard.  Rectal examination as per GI showed inflamed and friable left external and no hemorrhoid Extremities: No cyanosis, clubbing or edema.   Peripheral pulses are palpable. Left upper extremity hemodialysis access.  Psych: Alert, awake and oriented, normal mood CNS:  No cranial nerve deficits.  Power equal in all extremities.   Skin: Warm and dry.  No rashes noted.  The results of significant diagnostics from this hospitalization (including imaging, microbiology, ancillary and laboratory) are listed below for reference.     Diagnostic Studies:   No results found.   Labs:   Basic Metabolic Panel: Recent Labs  Lab 06/04/20 1150 06/05/20 0029  NA 137 139  K 3.6 4.1  CL 92* 93*  CO2 32 27  GLUCOSE 120* 80  BUN 17 20  CREATININE 8.86* 10.11*  CALCIUM 8.8* 8.8*   GFR Estimated Creatinine Clearance: 9 mL/min (A) (by C-G formula based on SCr of 10.11 mg/dL (H)). Liver Function Tests: Recent Labs  Lab 06/04/20 1150  AST 11*  ALT 9  ALKPHOS 53  BILITOT 1.0  PROT 6.9  ALBUMIN 3.6   No results for input(s): LIPASE, AMYLASE in the last 168 hours. No results for input(s): AMMONIA in the last 168 hours. Coagulation profile No results for input(s): INR, PROTIME in the last 168 hours.  CBC: Recent Labs  Lab 06/04/20 1150 06/04/20 1800 06/05/20 0029  WBC 6.6  --  6.4  HGB 13.9 13.3 13.6  HCT 43.5 41.7 43.6  MCV 101.9*  --  104.3*  PLT 183  --  186   Cardiac Enzymes: No results for input(s): CKTOTAL, CKMB, CKMBINDEX, TROPONINI in the last 168 hours. BNP: Invalid input(s): POCBNP CBG: No results for input(s): GLUCAP in the last 168 hours. D-Dimer No results for input(s): DDIMER in the last 72 hours. Hgb A1c No results for input(s): HGBA1C in the last 72 hours. Lipid Profile No results for input(s): CHOL, HDL, LDLCALC, TRIG, CHOLHDL, LDLDIRECT in the last 72 hours. Thyroid function studies No results for input(s): TSH, T4TOTAL, T3FREE, THYROIDAB in the last 72 hours.  Invalid input(s): FREET3 Anemia work up No results for input(s): VITAMINB12, FOLATE, FERRITIN, TIBC, IRON, RETICCTPCT in the last 72  hours. Microbiology Recent Results (from the past 240 hour(s))  Resp Panel by RT-PCR (Flu A&B, Covid) Nasopharyngeal Swab     Status: None   Collection Time: 06/04/20  1:39 PM   Specimen: Nasopharyngeal Swab; Nasopharyngeal(NP) swabs in vial transport medium  Result Value Ref Range Status   SARS Coronavirus 2 by RT PCR NEGATIVE NEGATIVE Final    Comment: (NOTE) SARS-CoV-2 target nucleic acids are NOT DETECTED.  The SARS-CoV-2 RNA is generally detectable in upper respiratory specimens during the acute phase of infection. The lowest concentration of SARS-CoV-2 viral copies this assay can detect is 138 copies/mL. A negative result does not preclude SARS-Cov-2 infection and should not be used as the sole basis for treatment or other patient management decisions. A negative result may occur with  improper specimen collection/handling, submission of specimen other than nasopharyngeal swab, presence of viral mutation(s) within the areas targeted by this assay, and inadequate number of viral copies(<138 copies/mL). A negative result must be combined with clinical observations, patient history, and epidemiological information. The expected result is Negative.  Fact Sheet for Patients:  EntrepreneurPulse.com.au  Fact Sheet  for Healthcare Providers:  IncredibleEmployment.be  This test is no t yet approved or cleared by the [place] and  has been authorized for detection and/or diagnosis of SARS-CoV-2 by FDA under an Emergency Use Authorization (EUA). This EUA will remain  in effect (meaning this test can be used) for the duration of the COVID-19 declaration under Section 564(b)(1) of the Act, 21 U.S.C.section 360bbb-3(b)(1), unless the authorization is terminated  or revoked sooner.       Influenza A by PCR NEGATIVE NEGATIVE Final   Influenza B by PCR NEGATIVE NEGATIVE Final    Comment: (NOTE) The Xpert Xpress SARS-CoV-2/FLU/RSV plus assay  is intended as an aid in the diagnosis of influenza from Nasopharyngeal swab specimens and should not be used as a sole basis for treatment. Nasal washings and aspirates are unacceptable for Xpert Xpress SARS-CoV-2/FLU/RSV testing.  Fact Sheet for Patients: EntrepreneurPulse.com.au  Fact Sheet for Healthcare Providers: IncredibleEmployment.be  This test is not yet approved or cleared by the [place] FDA and has been authorized for detection and/or diagnosis of SARS-CoV-2 by FDA under an Emergency Use Authorization (EUA). This EUA will remain in effect (meaning this test can be used) for the duration of the COVID-19 declaration under Section 564(b)(1) of the Act, 21 U.S.C. section 360bbb-3(b)(1), unless the authorization is terminated or revoked.  Performed at [facility], [place] [address], [place], [place] [address]      Discharge Instructions:   Discharge Instructions    Diet - low sodium heart healthy   Complete by: As directed    Discharge instructions   Complete by: As directed    Follow up with Dr Earlean Shawl, your GI doctor in 2 to 3 weeks to discuss about colonoscopy. Follow-up with your primary care physician in 1 week. Check blood work at that time. You experience ongoing bleeding, seek medical attention. Use medications as prescribed. Do note take over-the-counter pain medication including Aleve and Motrin.   Increase activity slowly   Complete by: As directed      Allergies as of 06/05/2020      Reactions   Penicillins Nausea And Vomiting, Rash   Has patient had a PCN reaction causing immediate rash, facial/tongue/throat swelling, SOB or lightheadedness with hypotension: yes Has patient had a PCN reaction causing severe rash involving mucus membranes or skin necrosis: no Has patient had a PCN reaction that required hospitalization: no Has patient had a PCN reaction occurring within the last 10 years: No If  all of the above answers are "NO", then may proceed with Cephalosporin use.      Medication List    TAKE these medications   acetaminophen 500 MG tablet Commonly known as: TYLENOL Take 1,000 mg by mouth every 6 (six) hours as needed for moderate pain.   Auryxia 1 GM 210 MG(Fe) tablet Generic drug: ferric citrate Take 840 mg by mouth 3 (three) times daily with meals.   cinacalcet 30 MG tablet Commonly known as: SENSIPAR Take 30 mg by mouth daily.   diltiazem 180 MG 24 hr capsule Commonly known as: CARDIZEM CD Take 1 capsule (180 mg total) by mouth daily.   hydrocortisone 2.5 % rectal cream Commonly known as: ANUSOL-HC Place rectally 2 (two) times daily.   hydrOXYzine 25 MG tablet Commonly known as: ATARAX/VISTARIL Take 1 tablet (25 mg total) by mouth every 6 (six) hours as needed for anxiety, itching or nausea. What changed:   when to take this  reasons to take this  LORazepam 1 MG tablet Commonly known as: ATIVAN Take 1 mg by mouth every 8 (eight) hours as needed for anxiety.   melatonin 5 MG Tabs Take 1 tablet (5 mg total) by mouth at bedtime as needed (sleep).   metoprolol tartrate 25 MG tablet Commonly known as: LOPRESSOR Take 1 tablet (25 mg total) by mouth 2 (two) times daily.   multivitamin Tabs tablet Take 1 tablet by mouth daily.   pantoprazole 40 MG tablet Commonly known as: Protonix Take 1 tablet (40 mg total) by mouth daily.   triamcinolone 0.1 % Commonly known as: KENALOG Apply 1 application topically 2 (two) times daily.       Follow-up Information    Medoff, Dellis Filbert, MD. Schedule an appointment as soon as possible for a visit in 2 day(s).   Specialty: Gastroenterology Why: for bleeding issues, might need colonosocpy Contact information: [place] [address] [phone]        Lorene Dy, MD. Schedule an appointment as soon as possible for a visit in 1 week(s).   Specialty: Internal Medicine Why: check blood  work at that time Contact information: [place], [place] [address] [phone]                Time coordinating discharge: 39 minutes  Signed:  Lasheka Kempner  [facility] 06/05/2020, 8:56 AM

## 2020-06-06 LAB — HIV ANTIBODY (ROUTINE TESTING W REFLEX): HIV Screen 4th Generation wRfx: NONREACTIVE

## 2020-11-30 ENCOUNTER — Other Ambulatory Visit: Payer: Self-pay | Admitting: Family Medicine

## 2020-11-30 DIAGNOSIS — K862 Cyst of pancreas: Secondary | ICD-10-CM

## 2020-12-01 ENCOUNTER — Emergency Department (HOSPITAL_COMMUNITY)
Admission: EM | Admit: 2020-12-01 | Discharge: 2020-12-02 | Disposition: A | Discharge status: Home / Self Care | Payer: Medicare Other | Attending: Emergency Medicine | Admitting: Emergency Medicine

## 2020-12-01 ENCOUNTER — Encounter (HOSPITAL_COMMUNITY): Payer: Self-pay | Admitting: Pharmacy Technician

## 2020-12-01 ENCOUNTER — Emergency Department (HOSPITAL_COMMUNITY): Payer: Medicare Other

## 2020-12-01 ENCOUNTER — Other Ambulatory Visit: Payer: Self-pay

## 2020-12-01 DIAGNOSIS — Z79899 Other long term (current) drug therapy: Secondary | ICD-10-CM | POA: Diagnosis not present

## 2020-12-01 DIAGNOSIS — Z992 Dependence on renal dialysis: Secondary | ICD-10-CM | POA: Diagnosis not present

## 2020-12-01 DIAGNOSIS — Z8546 Personal history of malignant neoplasm of prostate: Secondary | ICD-10-CM | POA: Insufficient documentation

## 2020-12-01 DIAGNOSIS — E1122 Type 2 diabetes mellitus with diabetic chronic kidney disease: Secondary | ICD-10-CM | POA: Diagnosis not present

## 2020-12-01 DIAGNOSIS — R42 Dizziness and giddiness: Secondary | ICD-10-CM | POA: Diagnosis present

## 2020-12-01 DIAGNOSIS — I120 Hypertensive chronic kidney disease with stage 5 chronic kidney disease or end stage renal disease: Secondary | ICD-10-CM | POA: Insufficient documentation

## 2020-12-01 DIAGNOSIS — N186 End stage renal disease: Secondary | ICD-10-CM | POA: Diagnosis not present

## 2020-12-01 LAB — BASIC METABOLIC PANEL
Anion gap: 11 (ref 5–15)
BUN: 25 mg/dL — ABNORMAL HIGH (ref 8–23)
CO2: 34 mmol/L — ABNORMAL HIGH (ref 22–32)
Calcium: 9.9 mg/dL (ref 8.9–10.3)
Chloride: 97 mmol/L — ABNORMAL LOW (ref 98–111)
Creatinine, Ser: 13.51 mg/dL — ABNORMAL HIGH (ref 0.61–1.24)
GFR, Estimated: 4 mL/min — ABNORMAL LOW (ref >60)
Glucose, Bld: 91 mg/dL (ref 70–99)
Potassium: 3.5 mmol/L (ref 3.5–5.1)
Sodium: 142 mmol/L (ref 135–145)

## 2020-12-01 LAB — CBC WITH DIFFERENTIAL/PLATELET
Abs Immature Granulocytes: 0.02 10*3/uL (ref 0.00–0.07)
Basophils Absolute: 0 10*3/uL (ref 0.0–0.1)
Basophils Relative: 1 %
Eosinophils Absolute: 0.2 10*3/uL (ref 0.0–0.5)
Eosinophils Relative: 3 %
HCT: 33.4 % — ABNORMAL LOW (ref 39.0–52.0)
Hemoglobin: 10.5 g/dL — ABNORMAL LOW (ref 13.0–17.0)
Immature Granulocytes: 0 %
Lymphocytes Relative: 28 %
Lymphs Abs: 1.7 10*3/uL (ref 0.7–4.0)
MCH: 31 pg (ref 26.0–34.0)
MCHC: 31.4 g/dL (ref 30.0–36.0)
MCV: 98.5 fL (ref 80.0–100.0)
Monocytes Absolute: 0.8 10*3/uL (ref 0.1–1.0)
Monocytes Relative: 13 %
Neutro Abs: 3.4 10*3/uL (ref 1.7–7.7)
Neutrophils Relative %: 55 %
Platelets: 204 10*3/uL (ref 150–400)
RBC: 3.39 MIL/uL — ABNORMAL LOW (ref 4.22–5.81)
RDW: 14 % (ref 11.5–15.5)
WBC: 6 10*3/uL (ref 4.0–10.5)
nRBC: 0 % (ref 0.0–0.2)

## 2020-12-01 LAB — POC OCCULT BLOOD, ED: Fecal Occult Bld: NEGATIVE

## 2020-12-01 MED ORDER — MECLIZINE HCL 25 MG PO TABS
25.0000 mg | ORAL_TABLET | Freq: Once | ORAL | Status: AC
  Administered 2020-12-01: 25 mg via ORAL
  Filled 2020-12-01: qty 1

## 2020-12-01 NOTE — ED Provider Notes (Signed)
Emergency Medicine Provider Triage Evaluation Note  [name] , a 60 y.o. male  was evaluated in triage.  Pt complains of dizziness x2 weeks. Patient described dizziness as both spinning sensation and feeling off balance. Denies speech changes, visual changes, and unilateral weakness. Denies chest pain and shortness of breath. He also states he has been having trouble sleeping because he misses his wife that passed away last year. Patient has a history of ESRD on dialysis MWF and missed dialysis today due to the dizziness.   Review of Systems  Positive: dizziness Negative: fever  Physical Exam  BP (!) 156/99   Pulse 71   Temp 98.2 F (36.8 C) (Oral)   Resp 18   SpO2 100%  Gen:   Awake, no distress   Resp:  Normal effort  MSK:   Moves extremities without difficulty  Other:    Medical Decision Making  Medically screening exam initiated at 6:23 PM.  Appropriate orders placed.  LUDGER BONES was informed that the remainder of the evaluation will be completed by another provider, this initial triage assessment does not replace that evaluation, and the importance of remaining in the ED until their evaluation is complete.  Dizziness, labs ordered.    Suzy Bouchard, PA-C 12/01/20 1825    Dorie Rank, MD 12/02/20 [phone]

## 2020-12-01 NOTE — ED Triage Notes (Signed)
Pt here with reports of not feeling well. Pt missed his dialysis today because of feeling dizzy. Pt endorses dizziness X2 weeks. Pt also states he has had trouble sleeping due to missing his wife who passed away last year.

## 2020-12-01 NOTE — ED Provider Notes (Signed)
[facility] EMERGENCY DEPARTMENT Provider Note   CSN: [id number] Arrival date & time: 12/01/20  1708     History Chief Complaint  Patient presents with   Dizziness    Jonathan TAVANO is a 60 y.o. male with a hx of ESRD on dialysis MWF, anemia, hypertension, hyperlipidemia, OSA, & prior GI bleed who presents to the ED with complaints of dizziness intermittently x 2 weeks. Patient reports dizziness feels like room is spinning sometimes with nausea, it comes and goes, lasts a few minutes at a time, triggered by quick position changes/head movements. If he stays still and closes his eyes sxs will resolve. He states his blood pressure has been high. Takes metoprolol BID, PCP just started Amlodipine, taking 2.5 mg per day then was increased to 5 mg per day. Has mild headaches at times, hx of similar, gradual onset, steady progression, Denies visual disturbance, numbness, weakness, syncope, chest pain, dyspnea, abdominal pain, vomiting, diarrhea, or melena. Last dialysis was Monday.   HPI     Past Medical History:  Diagnosis Date   Anemia 10/01/2015   Arthritis    SPINE   Borderline diabetes mellitus    BPH (benign prostatic hypertrophy)    Cancer (HCC)    CKD (chronic kidney disease), stage III ([place])    Diabetes mellitus without complication ([place])    borderline on no meds    Dialysis patient ([place])    Elevated PSA    Gout    STABLE  PER PT 10-22-2013   H/O hiatal hernia    Hyperlipidemia    Hypertension    Microhematuria    Nocturia    OSA (obstructive sleep apnea)    PER PT STUDY DONE 2005 (APPROX).  NON- COMPLIANT CPAP   SVT (supraventricular tachycardia) (HCC)    Wears glasses     Patient Active Problem List   Diagnosis Date Noted   GI bleed 06/04/2020   ESRD (end stage renal disease) ([place]) 06/04/2020   Anxiety 16/60/6301   Complication of vascular access for dialysis 09/23/2019   Pulmonary edema 05/31/2019   Symptomatic anemia    CKD (chronic kidney  disease)    Hemoptysis 10/01/2015   Anemia 10/01/2015   Unintentional weight loss 10/01/2015   OSA (obstructive sleep apnea)    Baker's cyst of knee    Joint effusion, knee    Effusion of right knee 09/21/2014   Epididymitis 09/21/2014   Prostate cancer ([place]) 12/18/2013   Weakness generalized 05/30/2013   Prolonged QT interval 05/30/2013   Other and unspecified hyperlipidemia 05/30/2013   Essential hypertension, benign 05/30/2013   BPH (benign prostatic hypertrophy) 05/30/2013   Acute gout 05/30/2013   CKD (chronic kidney disease) stage 3, GFR 30-59 ml/min ([place]) 05/30/2013    Past Surgical History:  Procedure Laterality Date   AV FISTULA PLACEMENT Left 08/17/2015   Procedure: BRACHIOCEPHALIC ARTERIOVENOUS (AV) FISTULA CREATION  left arm;  Surgeon: Conrad Beaverton, MD;  Location: [facility];  Service: Vascular;  Laterality: Left;   CARDIAC CATHETERIZATION  10-04-2006   DR Rollene Fare   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%/  2ndDIAGONAL 30%/  RCA 40%/  EF 60%   CARDIAC CATHETERIZATION  04-07-2002  DR [facility]   NON-CRITICAL CAD/  PRESERVED LV   CYSTOSCOPY W/ RETROGRADES Bilateral 10/27/2013   Procedure: CYSTOSCOPY WITH RETROGRADE PYELOGRAM;  Surgeon: Molli Hazard, MD;  Location: [facility];  Service: Urology;  Laterality: Bilateral;   LYMPHADENECTOMY Bilateral 12/18/2013   Procedure: PELVIC LYMPH NODE DISSECTION;  Surgeon:  Alexis Frock, MD;  Location: [facility];  Service: Urology;  Laterality: Bilateral;   PROSTATE BIOPSY N/A 10/27/2013   Procedure: BIOPSY TRANSRECTAL ULTRASONIC PROSTATE (TUBP);  Surgeon: Molli Hazard, MD;  Location: [facility];  Service: Urology;  Laterality: N/A;   PROSTATE SURGERY     RIGHT HAND SURGERY  YRS AGO   ROBOT ASSISTED LAPAROSCOPIC RADICAL PROSTATECTOMY N/A 12/18/2013   Procedure: ROBOTIC ASSISTED LAPAROSCOPIC RADICAL PROSTATECTOMY AND INDOCYANINE GREEN DYE;  Surgeon: Alexis Frock, MD;  Location: [facility];  Service: Urology;   Laterality: N/A;   TRANSTHORACIC ECHOCARDIOGRAM  07-03-2006   MILD - MODERATE LVH/  EF 55-60%/  GRADE I DIASTOIC DYSFUNCTION/  TRIVIAL  TR /  TRIVIAL PERICARDIAL EFFUSION       Family History  Problem Relation Age of Onset   Other Mother        shot and killed   Cirrhosis Father     Social History   Tobacco Use   Smoking status: Never   Smokeless tobacco: Never  Vaping Use   Vaping Use: Never used  Substance Use Topics   Alcohol use: No   Drug use: No    Home Medications Prior to Admission medications   Medication Sig Start Date End Date Taking? Authorizing Provider  acetaminophen (TYLENOL) 500 MG tablet Take 1,000 mg by mouth every 6 (six) hours as needed for moderate pain.    [provider]  AURYXIA 1 GM 210 MG(Fe) tablet Take 840 mg by mouth 3 (three) times daily with meals. 03/03/19   [provider]  cinacalcet (SENSIPAR) 30 MG tablet Take 30 mg by mouth daily. 11/14/19   [provider]  diltiazem (CARDIZEM CD) 180 MG 24 hr capsule Take 1 capsule (180 mg total) by mouth daily. Patient not taking: No sig reported 04/24/19   Hayden Rasmussen, MD  hydrocortisone (ANUSOL-HC) 2.5 % rectal cream Place rectally 2 (two) times daily. 06/05/20   Pokhrel, Corrie Mckusick, MD  hydrOXYzine (ATARAX/VISTARIL) 25 MG tablet Take 1 tablet (25 mg total) by mouth every 6 (six) hours as needed for anxiety, itching or nausea. 06/05/20   Pokhrel, Corrie Mckusick, MD  LORazepam (ATIVAN) 1 MG tablet Take 1 mg by mouth every 8 (eight) hours as needed for anxiety.    [provider]  melatonin 5 MG TABS Take 1 tablet (5 mg total) by mouth at bedtime as needed (sleep). 06/05/20   Pokhrel, Corrie Mckusick, MD  metoprolol tartrate (LOPRESSOR) 25 MG tablet Take 1 tablet (25 mg total) by mouth 2 (two) times daily. Patient not taking: Reported on 06/04/2020 03/26/19   Ward, Delice Bison, DO  multivitamin (RENA-VIT) TABS tablet Take 1 tablet by mouth daily.    [provider]  pantoprazole  (PROTONIX) 40 MG tablet Take 1 tablet (40 mg total) by mouth daily. 06/05/20 06/05/21  Pokhrel, Corrie Mckusick, MD  triamcinolone (KENALOG) 0.1 % Apply 1 application topically 2 (two) times daily. 03/15/20   [provider]    Allergies    Penicillins  Review of Systems   Review of Systems  Constitutional:  Negative for chills and fever.  Respiratory:  Negative for shortness of breath.   Cardiovascular:  Negative for chest pain.  Gastrointestinal:  Positive for nausea. Negative for abdominal pain.  Neurological:  Positive for dizziness and headaches. Negative for seizures, syncope, facial asymmetry, speech difficulty and weakness.  All other systems reviewed and are negative.  Physical Exam Updated Vital Signs BP (!) 177/97 (BP Location: Right Arm)  Pulse 62   Temp 98.2 F (36.8 C) (Oral)   Resp 17   SpO2 100%   Physical Exam Vitals and nursing note reviewed.  Constitutional:      General: He is not in acute distress.    Appearance: Normal appearance. He is not toxic-appearing.  HENT:     Head: Normocephalic and atraumatic.     Mouth/Throat:     Pharynx: Oropharynx is clear. Uvula midline.  Eyes:     General: Vision grossly intact. Gaze aligned appropriately.     Extraocular Movements: Extraocular movements intact.     Conjunctiva/sclera: Conjunctivae normal.     Pupils: Pupils are equal, round, and reactive to light.     Comments: No rotational or vertical nystagmus. No proptosis.   Cardiovascular:     Rate and Rhythm: Normal rate and regular rhythm.  Pulmonary:     Effort: Pulmonary effort is normal.     Breath sounds: Normal breath sounds.  Abdominal:     General: There is no distension.     Palpations: Abdomen is soft.     Tenderness: There is no abdominal tenderness. There is no guarding or rebound.  Genitourinary:    Comments: DRE performed with RN Jazmine as chaperone.  Soft brown stool, fecal occult negative.  Musculoskeletal:     Cervical back: Normal  range of motion and neck supple. No rigidity.  Skin:    General: Skin is warm and dry.  Neurological:     Mental Status: He is alert.     Comments: Alert. Clear speech. No facial droop. CNIII-XII grossly intact. Bilateral upper and lower extremities' sensation grossly intact. 5/5 symmetric strength with grip strength and with plantar and dorsi flexion bilaterally . Normal finger to nose bilaterally. Negative pronator drift. Gait intact.    Psychiatric:        Mood and Affect: Mood normal.        Behavior: Behavior normal.    ED Results / Procedures / Treatments   Labs (all labs ordered are listed, but only abnormal results are displayed) Labs Reviewed  CBC WITH DIFFERENTIAL/PLATELET - Abnormal; Notable for the following components:      Result Value   RBC 3.39 (*)    Hemoglobin 10.5 (*)    HCT 33.4 (*)    All other components within normal limits  BASIC METABOLIC PANEL - Abnormal; Notable for the following components:   Chloride 97 (*)    CO2 34 (*)    BUN 25 (*)    Creatinine, Ser 13.51 (*)    GFR, Estimated 4 (*)    All other components within normal limits    EKG EKG Interpretation  Date/Time:  Wednesday December 01 2020 18:17:18 EDT Ventricular Rate:  75 PR Interval:  202 QRS Duration: 94 QT Interval:  440 QTC Calculation: 491 R Axis:   6 Text Interpretation: Normal sinus rhythm Possible Left atrial enlargement Anteroseptal infarct , age undetermined Abnormal ECG Confirmed by Thamas Jaegers ([id number]) on 12/01/2020 9:56:31 PM  Radiology CT Head Wo Contrast  Result Date: 12/01/2020 CLINICAL DATA:  Nonspecific dizziness. Missed dialysis because of dizziness. EXAM: CT HEAD WITHOUT CONTRAST TECHNIQUE: Contiguous axial images were obtained from the base of the skull through the vertex without intravenous contrast. COMPARISON:  07/25/2012 FINDINGS: Brain: No evidence of acute infarction, hemorrhage, hydrocephalus, extra-axial collection or mass lesion/mass effect. Diffuse cerebral  atrophy. Low-attenuation changes in the deep white matter consistent small vessel ischemia. Basal ganglia and deep white matter calcifications, likely dystrophic.  Vascular: Moderate intracranial arterial vascular calcifications. Skull: Calvarium appears intact. Sinuses/Orbits: Paranasal sinuses and mastoid air cells are clear. Other: No significant changes since prior study. IMPRESSION: No acute intracranial abnormalities. Chronic atrophy and small vessel ischemic changes. Electronically Signed   By: Lucienne Capers M.D.   On: 12/01/2020 22:59    Procedures Procedures   Medications Ordered in ED Medications - No data to display  ED Course  I have reviewed the triage vital signs and the nursing notes.  Pertinent labs & imaging results that were available during my care of the patient were reviewed by me and considered in my medical decision making (see chart for details).    MDM Rules/Calculators/A&P                          Patient presents to the ED with complaints of dizziness intermittently x 2 weeks. Patient is nontoxic, BP elevated some.  Exam is benign.  No focal neurologic deficits.  Patient is ambulatory.  Additional history obtained:  Additional history obtained from chart review & nursing note review.   Lab Tests:  I Ordered, reviewed, and interpreted labs, which included:  CBC: Mild anemia which is new, this prompted a digital rectal exam which revealed soft brown stool and was fecal occult negative. BMP: Consistent with ESRD, potassium WNL Urinalysis: No UTI.  Imaging Studies ordered:  I ordered imaging studies which included CT head without contrast, I independently reviewed, formal radiology impression shows:  No acute intracranial abnormalities. Chronic atrophy and small vessel ischemic changes  ED Course:   Patient feeling improved status post meclizine. He has a negative test of skew, no focal neurologic deficits, no rotational/vertical nystagmus, he is ambulatory  in the ED and does not have constant sxs- overall does not seem consistent with CVA or other central cause of vertigo.  His new anemia will need to be rechecked by his primary care provider, DRE without findings of GI bleed.  Cardiac monitor without significant arrhythmias.  CT scan of the head does not show acute abnormalities.  He has had similar mild headaches in the past.  His blood pressure is elevated, recommended he discuss further blood pressure medication management with his primary care provider.  Given he is overall feeling improved, tolerating PO, has had a reassuring work-up, and has had serial reassuring neurologic exams feel he is appropriate for discharge. Will provide prescription for PRN meclizine- PCP follow up. I discussed results, treatment plan, need for follow-up, and return precautions with the patient. Provided opportunity for questions, patient confirmed understanding and is in agreement with plan.   Findings and plan of care discussed with supervising physician Dr. Almyra Free who is in agreement.   Blood pressure (!) 150/95, pulse 70, temperature 98.2 F (36.8 C), temperature source Oral, resp. rate 15, SpO2 96 %.   Portions of this note were generated with [employer]. Dictation errors may occur despite best attempts at proofreading.  Final Clinical Impression(s) / ED Diagnoses Final diagnoses:  Dizziness    Rx / DC Orders ED Discharge Orders          Ordered    meclizine (ANTIVERT) 12.5 MG tablet  3 times daily PRN        12/02/20 0507             Amaryllis Dyke, PA-C 12/02/20 0543    Luna Fuse, MD 12/03/20 [phone]

## 2020-12-02 LAB — URINALYSIS, ROUTINE W REFLEX MICROSCOPIC
Bacteria, UA: NONE SEEN
Bilirubin Urine: NEGATIVE
Glucose, UA: 150 mg/dL — AB
Hgb urine dipstick: NEGATIVE
Ketones, ur: NEGATIVE mg/dL
Nitrite: NEGATIVE
Protein, ur: 100 mg/dL — AB
Specific Gravity, Urine: 1.01 (ref 1.005–1.030)
pH: 9 — ABNORMAL HIGH (ref 5.0–8.0)

## 2020-12-02 MED ORDER — MECLIZINE HCL 12.5 MG PO TABS: 12.5000 mg | ORAL_TABLET | Freq: Three times a day (TID) | ORAL | 0 refills | Status: DC | PRN

## 2020-12-02 MED ORDER — ACETAMINOPHEN 325 MG PO TABS
650.0000 mg | ORAL_TABLET | Freq: Once | ORAL | Status: AC
  Administered 2020-12-02: 650 mg via ORAL
  Filled 2020-12-02: qty 2

## 2020-12-02 NOTE — Discharge Instructions (Addendum)
You were seen in the emergency department today for dizziness and elevated blood pressure.  Your labs did show that you have some mild anemia, please discuss this with your primary care provider.  Otherwise your labs look similar to prior blood work you have had done.  Your CT scan did not show any bleeding.  We are sending you home with meclizine to take every 8 hours as needed for dizziness.  Please be sure to transition positions slowly and carefully.    We have prescribed you new medication(s) today. Discuss the medications prescribed today with your pharmacist as they can have adverse effects and interactions with your other medicines including over the counter and prescribed medications. Seek medical evaluation if you start to experience new or abnormal symptoms after taking one of these medicines, seek care immediately if you start to experience difficulty breathing, feeling of your throat closing, facial swelling, or rash as these could be indications of a more serious allergic reaction  Please call your primary care office today to discuss your blood pressure and to schedule a follow appointment within the next 48 hours.  Return to the ER for new or worsening symptoms including but not limited to new or worsening dizziness, persistent dizziness, change in your vision, numbness, weakness, passing out, chest pain, trouble breathing, abdominal pain, inability to keep fluids down, or any other concerns.

## 2020-12-02 NOTE — ED Notes (Signed)
Patient verbalized understanding of discharge instructions. Opportunity for questions and answers. Pt informed to take bp medication

## 2020-12-02 NOTE — ED Notes (Signed)
Patient ambulated without assistance. O2 stats constant at 100%

## 2020-12-10 ENCOUNTER — Other Ambulatory Visit: Payer: Self-pay | Admitting: Acute Care

## 2020-12-10 DIAGNOSIS — K862 Cyst of pancreas: Secondary | ICD-10-CM

## 2020-12-16 ENCOUNTER — Ambulatory Visit
Admission: RE | Admit: 2020-12-16 | Discharge: 2020-12-16 | Disposition: A | Discharge status: Home / Self Care | Payer: 59 | Source: Ambulatory Visit | Attending: Family Medicine | Admitting: Family Medicine

## 2020-12-16 ENCOUNTER — Other Ambulatory Visit: Payer: Self-pay

## 2020-12-16 DIAGNOSIS — K862 Cyst of pancreas: Secondary | ICD-10-CM

## 2020-12-16 MED ORDER — IOPAMIDOL (ISOVUE-300) INJECTION 61%
100.0000 mL | Freq: Once | INTRAVENOUS | Status: AC | PRN
  Administered 2020-12-16: 100 mL via INTRAVENOUS

## 2021-01-02 ENCOUNTER — Other Ambulatory Visit: Payer: Self-pay

## 2021-01-02 ENCOUNTER — Other Ambulatory Visit: Payer: Self-pay | Admitting: Family Medicine

## 2021-01-02 ENCOUNTER — Encounter (HOSPITAL_COMMUNITY): Payer: Self-pay | Admitting: Emergency Medicine

## 2021-01-02 ENCOUNTER — Inpatient Hospital Stay (HOSPITAL_COMMUNITY)
Admission: EM | Admit: 2021-01-02 | Discharge: 2021-01-02 | DRG: 193 | Disposition: A | Discharge status: Home / Self Care | Payer: Medicare Other | Attending: Family Medicine | Admitting: Family Medicine

## 2021-01-02 ENCOUNTER — Emergency Department (HOSPITAL_COMMUNITY): Payer: Medicare Other

## 2021-01-02 DIAGNOSIS — I10 Essential (primary) hypertension: Secondary | ICD-10-CM | POA: Diagnosis not present

## 2021-01-02 DIAGNOSIS — Z992 Dependence on renal dialysis: Secondary | ICD-10-CM

## 2021-01-02 DIAGNOSIS — G4733 Obstructive sleep apnea (adult) (pediatric): Secondary | ICD-10-CM | POA: Diagnosis not present

## 2021-01-02 DIAGNOSIS — Z20822 Contact with and (suspected) exposure to covid-19: Secondary | ICD-10-CM | POA: Diagnosis not present

## 2021-01-02 DIAGNOSIS — Z79899 Other long term (current) drug therapy: Secondary | ICD-10-CM

## 2021-01-02 DIAGNOSIS — I471 Supraventricular tachycardia: Secondary | ICD-10-CM | POA: Diagnosis not present

## 2021-01-02 DIAGNOSIS — J189 Pneumonia, unspecified organism: Principal | ICD-10-CM | POA: Diagnosis present

## 2021-01-02 DIAGNOSIS — Z880 Allergy status to penicillin: Secondary | ICD-10-CM

## 2021-01-02 DIAGNOSIS — K219 Gastro-esophageal reflux disease without esophagitis: Secondary | ICD-10-CM | POA: Diagnosis not present

## 2021-01-02 DIAGNOSIS — E119 Type 2 diabetes mellitus without complications: Secondary | ICD-10-CM | POA: Diagnosis not present

## 2021-01-02 DIAGNOSIS — D631 Anemia in chronic kidney disease: Secondary | ICD-10-CM | POA: Diagnosis present

## 2021-01-02 DIAGNOSIS — N400 Enlarged prostate without lower urinary tract symptoms: Secondary | ICD-10-CM | POA: Diagnosis not present

## 2021-01-02 DIAGNOSIS — E1122 Type 2 diabetes mellitus with diabetic chronic kidney disease: Secondary | ICD-10-CM | POA: Diagnosis present

## 2021-01-02 DIAGNOSIS — I120 Hypertensive chronic kidney disease with stage 5 chronic kidney disease or end stage renal disease: Secondary | ICD-10-CM | POA: Diagnosis not present

## 2021-01-02 DIAGNOSIS — J0430 Supraglottitis, unspecified, without obstruction: Secondary | ICD-10-CM

## 2021-01-02 DIAGNOSIS — M109 Gout, unspecified: Secondary | ICD-10-CM | POA: Diagnosis not present

## 2021-01-02 DIAGNOSIS — N186 End stage renal disease: Secondary | ICD-10-CM | POA: Diagnosis not present

## 2021-01-02 DIAGNOSIS — J029 Acute pharyngitis, unspecified: Secondary | ICD-10-CM | POA: Diagnosis present

## 2021-01-02 DIAGNOSIS — R0602 Shortness of breath: Secondary | ICD-10-CM | POA: Diagnosis not present

## 2021-01-02 DIAGNOSIS — E785 Hyperlipidemia, unspecified: Secondary | ICD-10-CM | POA: Diagnosis not present

## 2021-01-02 DIAGNOSIS — E876 Hypokalemia: Secondary | ICD-10-CM | POA: Diagnosis present

## 2021-01-02 HISTORY — DX: Gastro-esophageal reflux disease without esophagitis: K21.9

## 2021-01-02 LAB — BASIC METABOLIC PANEL
Anion gap: 14 (ref 5–15)
BUN: 25 mg/dL — ABNORMAL HIGH (ref 8–23)
CO2: 32 mmol/L (ref 22–32)
Calcium: 10 mg/dL (ref 8.9–10.3)
Chloride: 96 mmol/L — ABNORMAL LOW (ref 98–111)
Creatinine, Ser: 11.16 mg/dL — ABNORMAL HIGH (ref 0.61–1.24)
GFR, Estimated: 5 mL/min — ABNORMAL LOW (ref >60)
Glucose, Bld: 89 mg/dL (ref 70–99)
Potassium: 3.3 mmol/L — ABNORMAL LOW (ref 3.5–5.1)
Sodium: 142 mmol/L (ref 135–145)

## 2021-01-02 LAB — CBC WITH DIFFERENTIAL/PLATELET
Abs Immature Granulocytes: 0.03 10*3/uL (ref 0.00–0.07)
Basophils Absolute: 0 10*3/uL (ref 0.0–0.1)
Basophils Relative: 1 %
Eosinophils Absolute: 0.2 10*3/uL (ref 0.0–0.5)
Eosinophils Relative: 2 %
HCT: 31 % — ABNORMAL LOW (ref 39.0–52.0)
Hemoglobin: 9.8 g/dL — ABNORMAL LOW (ref 13.0–17.0)
Immature Granulocytes: 0 %
Lymphocytes Relative: 13 %
Lymphs Abs: 1 10*3/uL (ref 0.7–4.0)
MCH: 31.4 pg (ref 26.0–34.0)
MCHC: 31.6 g/dL (ref 30.0–36.0)
MCV: 99.4 fL (ref 80.0–100.0)
Monocytes Absolute: 0.9 10*3/uL (ref 0.1–1.0)
Monocytes Relative: 11 %
Neutro Abs: 5.6 10*3/uL (ref 1.7–7.7)
Neutrophils Relative %: 73 %
Platelets: 235 10*3/uL (ref 150–400)
RBC: 3.12 MIL/uL — ABNORMAL LOW (ref 4.22–5.81)
RDW: 14.5 % (ref 11.5–15.5)
WBC: 7.8 10*3/uL (ref 4.0–10.5)
nRBC: 0 % (ref 0.0–0.2)

## 2021-01-02 LAB — HEMOGLOBIN A1C
Hgb A1c MFr Bld: 5.3 % (ref 4.8–5.6)
Mean Plasma Glucose: 105.41 mg/dL

## 2021-01-02 LAB — GROUP A STREP BY PCR: Group A Strep by PCR: NOT DETECTED

## 2021-01-02 LAB — RESP PANEL BY RT-PCR (FLU A&B, COVID) ARPGX2
Influenza A by PCR: NEGATIVE
Influenza B by PCR: NEGATIVE
SARS Coronavirus 2 by RT PCR: NEGATIVE

## 2021-01-02 LAB — C-REACTIVE PROTEIN: CRP: 1 mg/dL — ABNORMAL HIGH (ref <1.0)

## 2021-01-02 LAB — PROCALCITONIN: Procalcitonin: 0.25 ng/mL

## 2021-01-02 LAB — MONONUCLEOSIS SCREEN: Mono Screen: NEGATIVE

## 2021-01-02 LAB — CBG MONITORING, ED: Glucose-Capillary: 145 mg/dL — ABNORMAL HIGH (ref 70–99)

## 2021-01-02 MED ORDER — LABETALOL HCL 5 MG/ML IV SOLN: 20.0000 mg | INTRAVENOUS | Status: DC | PRN

## 2021-01-02 MED ORDER — PHENOL 1.4 % MT LIQD
2.0000 | OROMUCOSAL | Status: DC | PRN
  Filled 2021-01-02: qty 177

## 2021-01-02 MED ORDER — HEPARIN SODIUM (PORCINE) 5000 UNIT/ML IJ SOLN
5000.0000 [IU] | Freq: Three times a day (TID) | INTRAMUSCULAR | Status: DC
  Administered 2021-01-02: 5000 [IU] via SUBCUTANEOUS
  Filled 2021-01-02: qty 1

## 2021-01-02 MED ORDER — ACETAMINOPHEN 650 MG RE SUPP: 650.0000 mg | Freq: Four times a day (QID) | RECTAL | Status: DC | PRN

## 2021-01-02 MED ORDER — CLINDAMYCIN HCL 300 MG PO CAPS: 300.0000 mg | ORAL_CAPSULE | Freq: Three times a day (TID) | ORAL | 0 refills | Status: AC

## 2021-01-02 MED ORDER — IOHEXOL 350 MG/ML SOLN
75.0000 mL | Freq: Once | INTRAVENOUS | Status: AC | PRN
  Administered 2021-01-02: 75 mL via INTRAVENOUS

## 2021-01-02 MED ORDER — SODIUM CHLORIDE 0.9 % IV SOLN
2.0000 g | INTRAVENOUS | Status: DC
  Administered 2021-01-02: 2 g via INTRAVENOUS
  Filled 2021-01-02: qty 20

## 2021-01-02 MED ORDER — CLINDAMYCIN PHOSPHATE 600 MG/50ML IV SOLN
600.0000 mg | Freq: Three times a day (TID) | INTRAVENOUS | Status: DC
  Administered 2021-01-02: 600 mg via INTRAVENOUS
  Filled 2021-01-02: qty 50

## 2021-01-02 MED ORDER — SODIUM CHLORIDE 0.9 % IV SOLN
100.0000 mg | Freq: Two times a day (BID) | INTRAVENOUS | Status: DC
  Administered 2021-01-02: 100 mg via INTRAVENOUS
  Filled 2021-01-02: qty 100

## 2021-01-02 MED ORDER — DEXAMETHASONE SODIUM PHOSPHATE 10 MG/ML IJ SOLN
10.0000 mg | Freq: Once | INTRAMUSCULAR | Status: AC
  Administered 2021-01-02: 10 mg via INTRAVENOUS
  Filled 2021-01-02: qty 1

## 2021-01-02 MED ORDER — PREDNISONE 20 MG PO TABS: 40.0000 mg | ORAL_TABLET | Freq: Every day | ORAL | 0 refills | Status: AC

## 2021-01-02 MED ORDER — ACETAMINOPHEN 325 MG PO TABS: 650.0000 mg | ORAL_TABLET | Freq: Four times a day (QID) | ORAL | Status: DC | PRN

## 2021-01-02 MED ORDER — INSULIN ASPART 100 UNIT/ML IJ SOLN
0.0000 [IU] | Freq: Four times a day (QID) | INTRAMUSCULAR | Status: DC
  Filled 2021-01-02: qty 0.09

## 2021-01-02 MED ORDER — PANTOPRAZOLE SODIUM 40 MG IV SOLR
40.0000 mg | INTRAVENOUS | Status: DC
  Filled 2021-01-02: qty 40

## 2021-01-02 MED ORDER — CEFDINIR 300 MG PO CAPS: 300.0000 mg | ORAL_CAPSULE | Freq: Two times a day (BID) | ORAL | 0 refills | Status: AC

## 2021-01-02 MED ORDER — METHYLPREDNISOLONE SODIUM SUCC 40 MG IJ SOLR
40.0000 mg | Freq: Three times a day (TID) | INTRAMUSCULAR | Status: DC
  Administered 2021-01-02: 40 mg via INTRAVENOUS
  Filled 2021-01-02: qty 1

## 2021-01-02 MED ORDER — EPINEPHRINE PF 1 MG/ML IJ SOLN
0.5000 mg | Freq: Once | INTRAMUSCULAR | Status: AC
  Administered 2021-01-02: 0.5 mg via INTRAMUSCULAR
  Filled 2021-01-02: qty 1

## 2021-01-02 MED ORDER — HYDRALAZINE HCL 20 MG/ML IJ SOLN
10.0000 mg | Freq: Four times a day (QID) | INTRAMUSCULAR | Status: DC | PRN
  Administered 2021-01-02: 10 mg via INTRAVENOUS
  Filled 2021-01-02: qty 1

## 2021-01-02 MED ORDER — CLINDAMYCIN PHOSPHATE 900 MG/50ML IV SOLN
900.0000 mg | Freq: Once | INTRAVENOUS | Status: AC
  Administered 2021-01-02: 900 mg via INTRAVENOUS
  Filled 2021-01-02: qty 50

## 2021-01-02 MED ORDER — HYDRALAZINE HCL 20 MG/ML IJ SOLN
10.0000 mg | Freq: Four times a day (QID) | INTRAMUSCULAR | Status: DC
  Administered 2021-01-02: 10 mg via INTRAVENOUS
  Filled 2021-01-02: qty 1

## 2021-01-02 MED ORDER — SODIUM CHLORIDE (PF) 0.9 % IJ SOLN
INTRAMUSCULAR | Status: AC
  Filled 2021-01-02: qty 50

## 2021-01-02 MED ORDER — BISACODYL 10 MG RE SUPP: 10.0000 mg | Freq: Every day | RECTAL | Status: DC | PRN

## 2021-01-02 NOTE — ED Triage Notes (Signed)
Patient arrives complaining of a cough, sore throat, and nasal congestion. Patient states he has had a productive cough for 1.5-2 weeks with green sputum. Patient states today his sputum had blood in it. Patient states that about 30 minutes to an hour PTA that he began having throat pain. Patient states it feels like he is swallowing glass. Patient hx of dialysis.

## 2021-01-02 NOTE — Discharge Instructions (Signed)

## 2021-01-02 NOTE — H&P (Signed)
History and Physical    [name] UUV:[id number] DOB: 05-Jan-1961 DOA: 01/02/2021  PCP: Lorene Dy, MD  Patient coming from: Home   Chief Complaint:  Chief Complaint  Patient presents with   Sore Throat     HPI:    60 year old male with past medical history of end-stage renal disease (MWF), diabetes mellitus type 2 (not on meds), hypertension, gout, obstructive sleep apnea (not on CPAP), prostate cancer (S/P prostatectomy), hyperlipidemia and prolonged QT syndrome presenting to [facility] emergency department with complaints of shortness of breath cough and a sensation of tightness in his throat.  It is worth noting that the patient has given me a dramatically different history than what he initially provided to the triage nurse and emergency department staff.  Patient initially told triage nursing in the emergency department staff that he was having a 2-week history of productive cough with green sputum prior to the onset of tightness and choking sensation in his throat that developed overnight.  Upon my discussion with the patient and his significant other at the bedside patient states that his cough is chronic and is of no change compared to usual.  Patient reports to me that he felt "perfectly fine" prior to the events that transpired overnight.  Patient does endorse eating at a local red lobster seafood restaurant yesterday evening.  He recalls eating shrimp for dinner but states that he has never had an allergic reaction to shellfish in the past.  Patient states that he came home shortly thereafter and went to sleep at approximately 9 PM.  Patient explains that he woke up several hours later severely short of breath due to a sudden sensation of tightness in his throat.  Patient felt that he was "choking" and had significant throat pain but was still able to swallow his saliva.  Patient denies any associated wheezing.  Patient denies any fevers, sick contacts, recent  travel or contact with confirmed COVID-19 infection.  Due to this rather sudden onset of intense cough, shortness of breath and sensation of choking and the patient eventually presented to [facility] emergency department for evaluation.  Upon evaluation in the emergency department patient underwent CT imaging of the soft tissues of the neck revealing a pharyngitis and supraglottitis with mild narrowing of the supraglottic airway.  Chest x-ray also revealed a developing left lower lobe opacity and retrocardiac infiltrate concerning for pneumonia.  Patient was given a dose of intravenous clindamycin and intravenous dexamethasone.  Patient was placed on submental oxygen.  The hospitalist group was then called to assist the patient for admission to the hospital.  Review of Systems:   Review of Systems  Respiratory:  Positive for cough, sputum production and shortness of breath.   All other systems reviewed and are negative.  Past Medical History:  Diagnosis Date   Anemia 10/01/2015   Arthritis    SPINE   Borderline diabetes mellitus    Diabetes mellitus without complication ([place])    borderline on no meds    Dialysis patient [facility])    Elevated PSA    ESRD on hemodialysis ([place]) 06/04/2020   GERD without esophagitis 01/02/2021   Gout    STABLE  PER PT 10-22-2013   H/O hiatal hernia    Hyperlipidemia    Hypertension    Microhematuria    Nocturia    OSA (obstructive sleep apnea)    PER PT STUDY DONE 2005 (APPROX).  NON- COMPLIANT CPAP   SVT (supraventricular tachycardia) (HCC)  Wears glasses     Past Surgical History:  Procedure Laterality Date   AV FISTULA PLACEMENT Left 08/17/2015   Procedure: BRACHIOCEPHALIC ARTERIOVENOUS (AV) FISTULA CREATION  left arm;  Surgeon: Conrad Earlton, MD;  Location: [facility];  Service: Vascular;  Laterality: Left;   CARDIAC CATHETERIZATION  10-04-2006   DR Rollene Fare   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%/  2ndDIAGONAL 30%/  RCA 40%/  EF 60%   CARDIAC  CATHETERIZATION  04-07-2002  DR [facility]   NON-CRITICAL CAD/  PRESERVED LV   CYSTOSCOPY W/ RETROGRADES Bilateral 10/27/2013   Procedure: CYSTOSCOPY WITH RETROGRADE PYELOGRAM;  Surgeon: Molli Hazard, MD;  Location: [facility];  Service: Urology;  Laterality: Bilateral;   LYMPHADENECTOMY Bilateral 12/18/2013   Procedure: PELVIC LYMPH NODE DISSECTION;  Surgeon: Alexis Frock, MD;  Location: [facility];  Service: Urology;  Laterality: Bilateral;   PROSTATE BIOPSY N/A 10/27/2013   Procedure: BIOPSY TRANSRECTAL ULTRASONIC PROSTATE (TUBP);  Surgeon: Molli Hazard, MD;  Location: [facility];  Service: Urology;  Laterality: N/A;   PROSTATE SURGERY     RIGHT HAND SURGERY  YRS AGO   ROBOT ASSISTED LAPAROSCOPIC RADICAL PROSTATECTOMY N/A 12/18/2013   Procedure: ROBOTIC ASSISTED LAPAROSCOPIC RADICAL PROSTATECTOMY AND INDOCYANINE GREEN DYE;  Surgeon: Alexis Frock, MD;  Location: [facility];  Service: Urology;  Laterality: N/A;   TRANSTHORACIC ECHOCARDIOGRAM  07-03-2006   MILD - MODERATE LVH/  EF 55-60%/  GRADE I DIASTOIC DYSFUNCTION/  TRIVIAL  TR /  TRIVIAL PERICARDIAL EFFUSION     reports that he has never smoked. He has never used smokeless tobacco. He reports that he does not drink alcohol and does not use drugs.  Allergies  Allergen Reactions   Penicillins Nausea And Vomiting and Rash    Has patient had a PCN reaction causing immediate rash, facial/tongue/throat swelling, SOB or lightheadedness with hypotension: yes Has patient had a PCN reaction causing severe rash involving mucus membranes or skin necrosis: no Has patient had a PCN reaction that required hospitalization: no Has patient had a PCN reaction occurring within the last 10 years: No If all of the above answers are "NO", then may proceed with Cephalosporin use.     Family History  Problem Relation Age of Onset   Other Mother        shot and killed   Cirrhosis Father      Prior to Admission  medications   Medication Sig Start Date End Date Taking? Authorizing Provider  acetaminophen (TYLENOL) 500 MG tablet Take 1,000 mg by mouth every 6 (six) hours as needed for moderate pain.    [provider]  AURYXIA 1 GM 210 MG(Fe) tablet Take 840 mg by mouth 3 (three) times daily with meals. 03/03/19   [provider]  cinacalcet (SENSIPAR) 30 MG tablet Take 30 mg by mouth daily. 11/14/19   [provider]  diltiazem (CARDIZEM CD) 180 MG 24 hr capsule Take 1 capsule (180 mg total) by mouth daily. Patient not taking: No sig reported 04/24/19   Hayden Rasmussen, MD  hydrocortisone (ANUSOL-HC) 2.5 % rectal cream Place rectally 2 (two) times daily. 06/05/20   Pokhrel, Corrie Mckusick, MD  hydrOXYzine (ATARAX/VISTARIL) 25 MG tablet Take 1 tablet (25 mg total) by mouth every 6 (six) hours as needed for anxiety, itching or nausea. 06/05/20   Pokhrel, Corrie Mckusick, MD  LORazepam (ATIVAN) 1 MG tablet Take 1 mg by mouth every 8 (eight) hours as needed for anxiety.    [provider]  meclizine (  ANTIVERT) 12.5 MG tablet Take 1 tablet (12.5 mg total) by mouth 3 (three) times daily as needed for dizziness. 12/02/20   Petrucelli, Samantha R, PA-C  melatonin 5 MG TABS Take 1 tablet (5 mg total) by mouth at bedtime as needed (sleep). 06/05/20   Pokhrel, Corrie Mckusick, MD  metoprolol tartrate (LOPRESSOR) 25 MG tablet Take 1 tablet (25 mg total) by mouth 2 (two) times daily. Patient not taking: Reported on 06/04/2020 03/26/19   Ward, Delice Bison, DO  multivitamin (RENA-VIT) TABS tablet Take 1 tablet by mouth daily.    [provider]  pantoprazole (PROTONIX) 40 MG tablet Take 1 tablet (40 mg total) by mouth daily. 06/05/20 06/05/21  Pokhrel, Corrie Mckusick, MD  triamcinolone (KENALOG) 0.1 % Apply 1 application topically 2 (two) times daily. 03/15/20   [provider]    Physical Exam: Vitals:   01/02/21 0545 01/02/21 0600 01/02/21 0615 01/02/21 0619  BP: (!) 180/121 (!) 183/130 (!) 186/118 (!)  186/118  Pulse: 83 83 80   Resp: 19 17 16    Temp:      TempSrc:      SpO2: 100% 100% 100%   Weight:      Height:        Constitutional: Awake alert and oriented x3, patient is exhibiting mild respiratory distress. Skin: no rashes, no lesions, good skin turgor noted. Eyes: Pupils are equally reactive to light.  No evidence of scleral icterus or conjunctival pallor.  ENMT: No evidence of stridor.  Moist mucous membranes noted.  Posterior pharynx clear of any exudate or lesions.   Neck: Notable tenderness of the anterior neck without significant deformity.  Normal, supple, no masses,  No evidence of jugular venous distension.   Respiratory: Bibasilar rales noted.  No evidence of associated wheezing.  Normal respiratory effort. No accessory muscle use.  Cardiovascular: Regular rate and rhythm, no murmurs / rubs / gallops. No extremity edema. 2+ pedal pulses. No carotid bruits.  Chest:   Nontender without crepitus or deformity.   Back:   Nontender without crepitus or deformity. Abdomen: Abdomen is soft and nontender.  No evidence of intra-abdominal masses.  Positive bowel sounds noted in all quadrants.   Musculoskeletal: No joint deformity upper and lower extremities. Good ROM, no contractures. Normal muscle tone.  Neurologic: CN 2-12 grossly intact. Sensation intact.  Patient moving all 4 extremities spontaneously.  Patient is following all commands.  Patient is responsive to verbal stimuli.   Psychiatric: Patient exhibits normal mood with appropriate affect.  Patient seems to possess insight as to their current situation.     Labs on Admission: I have personally reviewed following labs and imaging studies -   CBC: Recent Labs  Lab 01/02/21 0221  WBC 7.8  NEUTROABS 5.6  HGB 9.8*  HCT 31.0*  MCV 99.4  PLT 811   Basic Metabolic Panel: Recent Labs  Lab 01/02/21 0221  NA 142  K 3.3*  CL 96*  CO2 32  GLUCOSE 89  BUN 25*  CREATININE 11.16*  CALCIUM 10.0   GFR: Estimated  Creatinine Clearance: 8.1 mL/min (A) (by C-G formula based on SCr of 11.16 mg/dL (H)). Liver Function Tests: No results for input(s): AST, ALT, ALKPHOS, BILITOT, PROT, ALBUMIN in the last 168 hours. No results for input(s): LIPASE, AMYLASE in the last 168 hours. No results for input(s): AMMONIA in the last 168 hours. Coagulation Profile: No results for input(s): INR, PROTIME in the last 168 hours. Cardiac Enzymes: No results for input(s): CKTOTAL, CKMB, CKMBINDEX, TROPONINI  in the last 168 hours. BNP (last 3 results) No results for input(s): PROBNP in the last 8760 hours. HbA1C: No results for input(s): HGBA1C in the last 72 hours. CBG: No results for input(s): GLUCAP in the last 168 hours. Lipid Profile: No results for input(s): CHOL, HDL, LDLCALC, TRIG, CHOLHDL, LDLDIRECT in the last 72 hours. Thyroid Function Tests: No results for input(s): TSH, T4TOTAL, FREET4, T3FREE, THYROIDAB in the last 72 hours. Anemia Panel: No results for input(s): VITAMINB12, FOLATE, FERRITIN, TIBC, IRON, RETICCTPCT in the last 72 hours. Urine analysis:    Component Value Date/Time   COLORURINE YELLOW 12/01/2020 2230   APPEARANCEUR CLEAR 12/01/2020 2230   LABSPEC 1.010 12/01/2020 2230   PHURINE 9.0 (H) 12/01/2020 2230   GLUCOSEU 150 (A) 12/01/2020 2230   HGBUR NEGATIVE 12/01/2020 2230   BILIRUBINUR NEGATIVE 12/01/2020 2230   KETONESUR NEGATIVE 12/01/2020 2230   PROTEINUR 100 (A) 12/01/2020 2230   UROBILINOGEN 0.2 09/22/2014 1346   NITRITE NEGATIVE 12/01/2020 2230   LEUKOCYTESUR SMALL (A) 12/01/2020 2230    Radiological Exams on Admission - Personally Reviewed: DG Chest 2 View  Result Date: 01/02/2021 CLINICAL DATA:  Cough, sore throat EXAM: CHEST - 2 VIEW COMPARISON:  05/31/2019 FINDINGS: Mild patchy left lower lobe opacity, suspicious for pneumonia. Right lung is clear. No pleural effusion or pneumothorax. IMPRESSION: Mild patchy left lower lobe opacity, suspicious for pneumonia. Electronically  Signed   By: Julian Hy M.D.   On: 01/02/2021 02:02   CT Soft Tissue Neck W Contrast  Result Date: 01/02/2021 CLINICAL DATA:  Initial evaluation for acute sore throat. EXAM: CT NECK WITH CONTRAST TECHNIQUE: Multidetector CT imaging of the neck was performed using the standard protocol following the bolus administration of intravenous contrast. CONTRAST:  62mL OMNIPAQUE IOHEXOL 350 MG/ML SOLN COMPARISON:  None. FINDINGS: Pharynx and larynx: Oral cavity within normal limits. No acute abnormality about the dentition. [place] tonsils within normal limits. Uvula is thickened and edematous. There is mucosal edema involving the hypopharynx, consistent with acute pharyngitis. Mild thickening seen along the aryepiglottic folds bilaterally, suggesting a degree of associated supraglottitis. Epiglottis itself is relatively normal. No significant retropharyngeal swelling or collection. Mild narrowing of the supraglottic airway which measures 5 mm in AP diameter at its most narrow point. Glottis itself is within normal limits. Subglottic airway patent clear. Salivary glands: Salivary glands including the parotid and submandibular glands are within normal limits. Thyroid: Tiny 3 mm nodule noted within the right thyroid lobe, of doubtful significance given size and patient age, no follow-up imaging recommended (ref: J Am Coll Radiol. 2015 Feb;12(2): 143-50).Thyroid otherwise unremarkable. Lymph nodes: No enlarged or pathologic adenopathy seen within the neck. Vascular: Normal intravascular enhancement seen throughout the neck. Moderate atherosclerotic change noted about the aortic arch, carotid bifurcations, and carotid siphons. Limited intracranial: Unremarkable. Visualized orbits: Unremarkable. Mastoids and visualized paranasal sinuses: Mild mucosal thickening noted within the ethmoidal air cells and right greater than left maxillary sinuses. Visualized paranasal sinuses are otherwise clear. Visualized mastoid air  cells and middle ear cavities are well pneumatized and free of fluid. Skeleton: No acute osseous finding. No worrisome osseous lesions. Moderate multilevel cervical spondylosis without high-grade spinal stenosis. Few scattered dental caries noted about the remaining teeth. Upper chest: Visualized upper chest demonstrates no definite acute finding. Mild scattered atelectatic changes noted within the lungs. Curvilinear lucency adjacent to the proximal descending intrathoracic aorta favored to reflect atelectasis and/or pulse a shin artifact. Other: None. IMPRESSION: 1. Findings consistent with acute pharyngitis/supraglottitis with associated mild  narrowing of the supraglottic airway as above. Close clinical monitoring suggested as this patient is felt to potentially be at risk for airway compromise. No discrete abscess or drainable fluid collection. 2. No other acute abnormality within the neck. 3. Aortic Atherosclerosis (ICD10-I70.0). Electronically Signed   By: Jeannine Boga M.D.   On: 01/02/2021 03:50    EKG: Personally reviewed.  Rhythm is normal sinus rhythm with heart rate of 90 bpm.  QTc quite prolonged at 554. no dynamic ST segment changes appreciated.  Assessment/Plan Principal Problem:   Acute pharyngitis  Patient reporting rather sudden onset of shortness of breath cough and a sensation of choking that woke him from sleep overnight prompting him to present CT imaging does confirm acute pharyngitis and supraglottitis Patient reports eating shrimp at a local red lobster restaurant the evening prior to his symptoms.   Group A strep testing Monospot testing pending COVID-19 testing negative In the absence of fever or leukocytosis and with the sudden onset of the symptoms over the course of just a few hours after eating shellfish I am concerned about the possibility that this could be a sudden IgE mediated allergic pharyngitis Treating left lower lobe pneumonia with intravenous antibiotics  which should also cover the possibility for a bacterial pharyngitis Simultaneously will treat patient with a dose of 0.5 mg of IM epinephrine and a 24-hour course of IV steroids As needed bronchodilator therapy Keeping patient n.p.o. at this time until symptoms improve Monitoring patient in the progressive unit for any evidence of progressive airway compromise.  No evidence of stridor or hypoxia at this time  Active Problems:   Pneumonia of left lower lobe due to infectious organism  Patient reported a 1-1/2 to 2-week history of cough to triage in the emergency department staff further documentation, although he denied these symptoms when I spoke to him.   Left lower lobe/retrocardiac infiltrate noted on chest x-ray Treating patient with intravenous ceftriaxone (which should cover the possibility of a bacterial pharyngitis) along with intravenous doxycycline (due to prolonged Qtc) Supplemental oxygen as necessary for bouts of hypoxia As needed bronchodilator therapy Blood cultures obtained COVID-19 testing negative    Hypokalemia  Mild Conservative management due to ESRD Monitoring potassium levels with serial chemistries    Essential hypertension  Patient presenting with substantial hypertension Patient currently n.p.o. due to substantial pharyngitis and supraglottitis Placing patient on scheduled intravenous hydralazine As needed labetalol for persisting elevation of blood pressures.    ESRD on hemodialysis [facility])  Will consult nephrology on 7/18 for resumption of dialysis if patient still happens to be hospitalized at that time.    Type 2 diabetes mellitus without complication, without long-term current use of insulin ([place])  Patient reports he is not currently on treatment for his history of diabetes mellitus and initially denied he even had it. Accu-Cheks before every meal and nightly with low intensity sign scale insulin    GERD without esophagitis  Intravenous PPI  daily   Code Status:  Full code Family Communication: Significant other at bedside who has been updated on plan of care  Status is: Inpatient  Remains inpatient appropriate because:Ongoing diagnostic testing needed not appropriate for outpatient work up, IV treatments appropriate due to intensity of illness or inability to take PO, and Inpatient level of care appropriate due to severity of illness  Dispo: The patient is from: Home              Anticipated d/c is to: Home  Patient currently is not medically stable to d/c.   Difficult to place patient No        Vernelle Emerald MD [facility] Pager [phone]  If 7PM-7AM, please contact night-coverage [email or web address] Use universal [place] password for that web site. If you do not have the password, please call the hospital operator.  01/02/2021, 7:32 AM  .Park Meo

## 2021-01-02 NOTE — Discharge Summary (Deleted)
  The note originally documented on this encounter has been moved the the encounter in which it belongs.  

## 2021-01-02 NOTE — ED Provider Notes (Signed)
[facility] DEPT Provider Note   CSN: [id number] Arrival date & time: 01/02/21  0121     History Chief Complaint  Patient presents with   Sore Throat    [name] is a 60 y.o. male.  Patient here with chief complaint of sore throat.  He states that he has had cough and congestion for the past week or 2.  States that this morning he woke with sore throat, which is progressively worsened throughout the day today.  He states that he has having difficulty swallowing his saliva, and has been spitting it out.  He denies fever, but is noted to have mildly elevated temperature in triage 99.8.  He is a dialysis patient, states that he has been going to dialysis regularly.  He denies shortness of breath.  The history is provided by the patient. No language interpreter was used.      Past Medical History:  Diagnosis Date   Anemia 10/01/2015   Arthritis    SPINE   Borderline diabetes mellitus    BPH (benign prostatic hypertrophy)    Cancer (HCC)    CKD (chronic kidney disease), stage III ([place])    Diabetes mellitus without complication ([place])    borderline on no meds    Dialysis patient ([place])    Elevated PSA    Gout    STABLE  PER PT 10-22-2013   H/O hiatal hernia    Hyperlipidemia    Hypertension    Microhematuria    Nocturia    OSA (obstructive sleep apnea)    PER PT STUDY DONE 2005 (APPROX).  NON- COMPLIANT CPAP   SVT (supraventricular tachycardia) (HCC)    Wears glasses     Patient Active Problem List   Diagnosis Date Noted   GI bleed 06/04/2020   ESRD (end stage renal disease) ([place]) 06/04/2020   Anxiety 66/29/4765   Complication of vascular access for dialysis 09/23/2019   Pulmonary edema 05/31/2019   Symptomatic anemia    CKD (chronic kidney disease)    Hemoptysis 10/01/2015   Anemia 10/01/2015   Unintentional weight loss 10/01/2015   OSA (obstructive sleep apnea)    Baker's cyst of knee    Joint effusion, knee    Effusion of  right knee 09/21/2014   Epididymitis 09/21/2014   Prostate cancer ([place]) 12/18/2013   Weakness generalized 05/30/2013   Prolonged QT interval 05/30/2013   Other and unspecified hyperlipidemia 05/30/2013   Essential hypertension, benign 05/30/2013   BPH (benign prostatic hypertrophy) 05/30/2013   Acute gout 05/30/2013   CKD (chronic kidney disease) stage 3, GFR 30-59 ml/min ([place]) 05/30/2013    Past Surgical History:  Procedure Laterality Date   AV FISTULA PLACEMENT Left 08/17/2015   Procedure: BRACHIOCEPHALIC ARTERIOVENOUS (AV) FISTULA CREATION  left arm;  Surgeon: Conrad Weeks[place], MD;  Location: [facility];  Service: Vascular;  Laterality: Left;   CARDIAC CATHETERIZATION  10-04-2006   DR Rollene Fare   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%/  2ndDIAGONAL 30%/  RCA 40%/  EF 60%   CARDIAC CATHETERIZATION  04-07-2002  DR [facility]   NON-CRITICAL CAD/  PRESERVED LV   CYSTOSCOPY W/ RETROGRADES Bilateral 10/27/2013   Procedure: CYSTOSCOPY WITH RETROGRADE PYELOGRAM;  Surgeon: Molli Hazard, MD;  Location: [facility];  Service: Urology;  Laterality: Bilateral;   LYMPHADENECTOMY Bilateral 12/18/2013   Procedure: PELVIC LYMPH NODE DISSECTION;  Surgeon: Alexis Frock, MD;  Location: [facility];  Service: Urology;  Laterality: Bilateral;   PROSTATE BIOPSY N/A 10/27/2013  Procedure: BIOPSY TRANSRECTAL ULTRASONIC PROSTATE (TUBP);  Surgeon: Molli Hazard, MD;  Location: [facility];  Service: Urology;  Laterality: N/A;   PROSTATE SURGERY     RIGHT HAND SURGERY  YRS AGO   ROBOT ASSISTED LAPAROSCOPIC RADICAL PROSTATECTOMY N/A 12/18/2013   Procedure: ROBOTIC ASSISTED LAPAROSCOPIC RADICAL PROSTATECTOMY AND INDOCYANINE GREEN DYE;  Surgeon: Alexis Frock, MD;  Location: [facility];  Service: Urology;  Laterality: N/A;   TRANSTHORACIC ECHOCARDIOGRAM  07-03-2006   MILD - MODERATE LVH/  EF 55-60%/  GRADE I DIASTOIC DYSFUNCTION/  TRIVIAL  TR /  TRIVIAL PERICARDIAL EFFUSION       Family History   Problem Relation Age of Onset   Other Mother        shot and killed   Cirrhosis Father     Social History   Tobacco Use   Smoking status: Never   Smokeless tobacco: Never  Vaping Use   Vaping Use: Never used  Substance Use Topics   Alcohol use: No   Drug use: No    Home Medications Prior to Admission medications   Medication Sig Start Date End Date Taking? Authorizing Provider  acetaminophen (TYLENOL) 500 MG tablet Take 1,000 mg by mouth every 6 (six) hours as needed for moderate pain.    [provider]  AURYXIA 1 GM 210 MG(Fe) tablet Take 840 mg by mouth 3 (three) times daily with meals. 03/03/19   [provider]  cinacalcet (SENSIPAR) 30 MG tablet Take 30 mg by mouth daily. 11/14/19   [provider]  diltiazem (CARDIZEM CD) 180 MG 24 hr capsule Take 1 capsule (180 mg total) by mouth daily. Patient not taking: No sig reported 04/24/19   Hayden Rasmussen, MD  hydrocortisone (ANUSOL-HC) 2.5 % rectal cream Place rectally 2 (two) times daily. 06/05/20   Pokhrel, Corrie Mckusick, MD  hydrOXYzine (ATARAX/VISTARIL) 25 MG tablet Take 1 tablet (25 mg total) by mouth every 6 (six) hours as needed for anxiety, itching or nausea. 06/05/20   Pokhrel, Corrie Mckusick, MD  LORazepam (ATIVAN) 1 MG tablet Take 1 mg by mouth every 8 (eight) hours as needed for anxiety.    [provider]  meclizine (ANTIVERT) 12.5 MG tablet Take 1 tablet (12.5 mg total) by mouth 3 (three) times daily as needed for dizziness. 12/02/20   Petrucelli, Samantha R, PA-C  melatonin 5 MG TABS Take 1 tablet (5 mg total) by mouth at bedtime as needed (sleep). 06/05/20   Pokhrel, Corrie Mckusick, MD  metoprolol tartrate (LOPRESSOR) 25 MG tablet Take 1 tablet (25 mg total) by mouth 2 (two) times daily. Patient not taking: Reported on 06/04/2020 03/26/19   Ward, Delice Bison, DO  multivitamin (RENA-VIT) TABS tablet Take 1 tablet by mouth daily.    [provider]  pantoprazole (PROTONIX) 40 MG tablet Take 1  tablet (40 mg total) by mouth daily. 06/05/20 06/05/21  Pokhrel, Corrie Mckusick, MD  triamcinolone (KENALOG) 0.1 % Apply 1 application topically 2 (two) times daily. 03/15/20   [provider]    Allergies    Penicillins  Review of Systems   Review of Systems  All other systems reviewed and are negative.  Physical Exam Updated Vital Signs BP (!) 179/108 (BP Location: Right Arm)   Pulse 97   Temp 99.8 F (37.7 C) (Oral)   Resp 15   Ht 5\' 11"  (1.803 m)   Wt 92.5 kg   SpO2 98%   BMI 28.45 kg/m   Physical Exam Vitals and nursing note reviewed.  Constitutional:      Appearance: He is well-developed.  HENT:     Head: Normocephalic and atraumatic.  Eyes:     Conjunctiva/sclera: Conjunctivae normal.  Cardiovascular:     Rate and Rhythm: Normal rate and regular rhythm.     Heart sounds: No murmur heard. Pulmonary:     Effort: Pulmonary effort is normal. No respiratory distress.     Breath sounds: Normal breath sounds.  Abdominal:     Palpations: Abdomen is soft.     Tenderness: There is no abdominal tenderness.  Musculoskeletal:        General: Normal range of motion.     Cervical back: Neck supple.  Skin:    General: Skin is warm and dry.  Neurological:     Mental Status: He is alert and oriented to person, place, and time.  Psychiatric:        Mood and Affect: Mood normal.        Behavior: Behavior normal.    ED Results / Procedures / Treatments   Labs (all labs ordered are listed, but only abnormal results are displayed) Labs Reviewed  GROUP A STREP BY PCR  RESP PANEL BY RT-PCR (FLU A&B, COVID) [id number]  CBC WITH DIFFERENTIAL/PLATELET  BASIC METABOLIC PANEL    EKG None  Radiology No results found.  Procedures .Critical Care  Date/Time: 01/02/2021 4:26 AM Performed by: Montine Circle, PA-C Authorized by: Montine Circle, PA-C   Critical care provider statement:    Critical care time (minutes):  42   Critical care was necessary to treat or prevent  imminent or life-threatening deterioration of the following conditions: Airway monitoring.   Critical care was time spent personally by me on the following activities:  Discussions with consultants, evaluation of patient's response to treatment, examination of patient, ordering and performing treatments and interventions, ordering and review of laboratory studies, ordering and review of radiographic studies, pulse oximetry, re-evaluation of patient's condition, obtaining history from patient or surrogate and review of old charts   Medications Ordered in ED Medications - No data to display  ED Course  I have reviewed the triage vital signs and the nursing notes.  Pertinent labs & imaging results that were available during my care of the patient were reviewed by me and considered in my medical decision making (see chart for details).    MDM Rules/Calculators/A&P                          Patient here with sore throat.  Sore throat started started this morning and worsened to the point where patient was having difficulty tolerating secretions.  He also reports that he has had cough and congestion for the past week and 1/2 to 2 weeks.  Patient states that his voice sounds different.  Given description of symptoms, will start Decadron, start clinda, and check CT.  Also will order strep test, COVID, and chest x-ray.  Strep test negative.  COVID is negative.  Chest x-ray shows left lower lobe infiltrate.  CT soft tissue neck shows supraglottitis.  Radiology comments, the patient is at risk for airway compromise, and should be monitored carefully.  Case discussed with Dr. Florina Ou, who agrees with plan for admission.  Appreciate Dr. Cyd Silence for admitting the patient for continued airway monitoring. Final Clinical Impression(s) / ED Diagnoses Final diagnoses:  Supraglottitis without airway obstruction  Acute pharyngitis, unspecified etiology  Community acquired pneumonia of left lower lobe of lung  Rx / DC Orders ED Discharge Orders     None        Montine Circle, PA-C 01/02/21 0428    Shanon Rosser, MD 01/02/21 [id number]

## 2021-01-02 NOTE — Discharge Summary (Signed)
Physician Discharge Summary  [name]:[id number] DOB: 1960-12-22 DOA: (Not on file)  PCP: Lorene Dy, MD  Admit date: 01/02/21 Discharge date: 01/02/2021  Admitted From: Home Disposition:  Home  Recommendations for Outpatient Follow-up:  Follow up with PCP in 1 week Please obtain BMP in 1 week to follow-up on hypokalemia Please follow up on the following pending results: Blood cultures He was started on 2 antibiotics to cover for both pneumonia and pharyngitis in addition to a 5-day prednisone burst.  Home Health: none  Equipment/Devices: none   Discharge Condition: Good CODE STATUS: Full  Diet recommendation: Renal  Brief/Interim Summary: From H&P by Dr. Inda Merlin: "60 year old male with past medical history of end-stage renal disease (MWF), diabetes mellitus type 2 (not on meds), hypertension, gout, obstructive sleep apnea (not on CPAP), prostate cancer (S/P prostatectomy), hyperlipidemia and prolonged QT syndrome presenting to [facility] emergency department with complaints of shortness of breath cough and a sensation of tightness in his throat.  It is worth noting that the patient has given me a dramatically different history than what he initially provided to the triage nurse and emergency department staff.   Patient initially told triage nursing in the emergency department staff that he was having a 2-week history of productive cough with green sputum prior to the onset of tightness and choking sensation in his throat that developed overnight.   Upon my discussion with the patient and his significant other at the bedside patient states that his cough is chronic and is of no change compared to usual.  Patient reports to me that he felt "perfectly fine" prior to the events that transpired overnight.  Patient does endorse eating at a local red lobster seafood restaurant yesterday evening.  He recalls eating shrimp for dinner but states that he has never  had an allergic reaction to shellfish in the past.  Patient states that he came home shortly thereafter and went to sleep at approximately 9 PM.  Patient explains that he woke up several hours later severely short of breath due to a sudden sensation of tightness in his throat.  Patient felt that he was "choking" and had significant throat pain but was still able to swallow his saliva.  Patient denies any associated wheezing.  Patient denies any fevers, sick contacts, recent travel or contact with confirmed COVID-19 infection.  Due to this rather sudden onset of intense cough, shortness of breath and sensation of choking and the patient eventually presented to [facility] emergency department for evaluation.  Upon evaluation in the emergency department patient underwent CT imaging of the soft tissues of the neck revealing a pharyngitis and supraglottitis with mild narrowing of the supraglottic airway.  Chest x-ray also revealed a developing left lower lobe opacity and retrocardiac infiltrate concerning for pneumonia.  Patient was given a dose of intravenous clindamycin and intravenous dexamethasone.  Patient was placed on submental oxygen.  The hospitalist group was then called to assist the patient for admission to the hospital."   Subjective on day of discharge: When I saw the patient, he reports significant improvement with both swallowing and pain in his neck.  He continues to have a cough.  Slight shortness of breath with exertion and slight cough, though this was going on for the past 2 weeks.  He did cough up blood last night after eating the seafood.  He is not having any fevers or wheezing.  He reports no history of allergies, hives/rashes, itching, or swelling  in his mouth/lips.  Discharge Diagnoses:  Pharyngitis  Evidence of this on CT  He progressed quickly throughout his stay and is ready to go home  He is not showing any signs of allergic reaction, with evidence of coughing up  blood acutely, seems like trauma  Omnicef 300 mg twice daily and clindamycin 300 mg 3 times daily to cover for this and in addition to pneumonia  Prednisone 40 mg daily for 5 days  Return precautions verbalized  CAP  Antibiotics as above  He remained stable on room air  Hypokalemia  Replaced in the ED, to be followed up on an outpatient basis  ESRD on HD  Continue home Velphoro, Auryxia and Sensipar, dialysis tomorrow  Anxiety  Continue lorazepam/hydroxyzine as needed  Melatonin as needed  Essential hypertension  Continue Norvasc 5 mg daily  GERD  Continue Protonix 40 mg daily  Discharge Instructions   Allergies as of 01/02/2021       Reactions   Penicillins Nausea And Vomiting, Rash   Has patient had a PCN reaction causing immediate rash, facial/tongue/throat swelling, SOB or lightheadedness with hypotension: yes Has patient had a PCN reaction causing severe rash involving mucus membranes or skin necrosis: no Has patient had a PCN reaction that required hospitalization: no Has patient had a PCN reaction occurring within the last 10 years: No If all of the above answers are "NO", then may proceed with Cephalosporin use.        Medication List        Accurate as of January 02, 2021 12:35 PM. If you have any questions, ask your nurse or doctor.          acetaminophen 500 MG tablet Commonly known as: TYLENOL Take 1,000 mg by mouth every 6 (six) hours as needed for moderate pain.   amLODipine 2.5 MG tablet Commonly known as: NORVASC Take 2.5 mg by mouth at bedtime.   amLODipine 5 MG tablet Commonly known as: NORVASC Take 5 mg by mouth at bedtime.   augmented betamethasone dipropionate 0.05 % cream Commonly known as: DIPROLENE-AF Apply 1 application topically 2 (two) times daily.   Auryxia 1 GM 210 MG(Fe) tablet Generic drug: ferric citrate Take 840 mg by mouth 3 (three) times daily with meals.   cefdinir 300 MG capsule Commonly known as: OMNICEF Take 1  capsule (300 mg total) by mouth 2 (two) times daily for 7 days.   cinacalcet 30 MG tablet Commonly known as: SENSIPAR Take 30 mg by mouth daily.   clindamycin 300 MG capsule Commonly known as: Cleocin Take 1 capsule (300 mg total) by mouth 3 (three) times daily for 7 days.   gabapentin 300 MG capsule Commonly known as: NEURONTIN Take 300 mg by mouth 3 (three) times a week.   hydrocortisone 2.5 % rectal cream Commonly known as: ANUSOL-HC Place rectally 2 (two) times daily.   hydrOXYzine 25 MG tablet Commonly known as: ATARAX/VISTARIL Take 1 tablet (25 mg total) by mouth every 6 (six) hours as needed for anxiety, itching or nausea.   LORazepam 1 MG tablet Commonly known as: ATIVAN Take 1 mg by mouth every 8 (eight) hours as needed for anxiety.   meclizine 12.5 MG tablet Commonly known as: ANTIVERT Take 1 tablet (12.5 mg total) by mouth 3 (three) times daily as needed for dizziness.   melatonin 5 MG Tabs Take 1 tablet (5 mg total) by mouth at bedtime as needed (sleep).   multivitamin Tabs tablet Take 1 tablet by mouth daily.  pantoprazole 40 MG tablet Commonly known as: Protonix Take 1 tablet (40 mg total) by mouth daily.   predniSONE 20 MG tablet Commonly known as: DELTASONE Take 2 tablets (40 mg total) by mouth daily with breakfast for 5 days.   triamcinolone cream 0.1 % Commonly known as: KENALOG Apply 1 application topically 2 (two) times daily.   Velphoro 500 MG chewable tablet Generic drug: sucroferric oxyhydroxide Chew 1,000 mg by mouth 3 (three) times daily.        Allergies  Allergen Reactions   Penicillins Nausea And Vomiting and Rash    Has patient had a PCN reaction causing immediate rash, facial/tongue/throat swelling, SOB or lightheadedness with hypotension: yes Has patient had a PCN reaction causing severe rash involving mucus membranes or skin necrosis: no Has patient had a PCN reaction that required hospitalization: no Has patient had a PCN  reaction occurring within the last 10 years: No If all of the above answers are "NO", then may proceed with Cephalosporin use.     Consultations: None   Procedures/Studies: DG Chest 2 View  Result Date: 01/02/2021 CLINICAL DATA:  Cough, sore throat EXAM: CHEST - 2 VIEW COMPARISON:  05/31/2019 FINDINGS: Mild patchy left lower lobe opacity, suspicious for pneumonia. Right lung is clear. No pleural effusion or pneumothorax. IMPRESSION: Mild patchy left lower lobe opacity, suspicious for pneumonia. Electronically Signed   By: Julian Hy M.D.   On: 01/02/2021 02:02   CT Soft Tissue Neck W Contrast  Result Date: 01/02/2021 CLINICAL DATA:  Initial evaluation for acute sore throat. EXAM: CT NECK WITH CONTRAST TECHNIQUE: Multidetector CT imaging of the neck was performed using the standard protocol following the bolus administration of intravenous contrast. CONTRAST:  59mL OMNIPAQUE IOHEXOL 350 MG/ML SOLN COMPARISON:  None. FINDINGS: Pharynx and larynx: Oral cavity within normal limits. No acute abnormality about the dentition. Palatine tonsils within normal limits. Uvula is thickened and edematous. There is mucosal edema involving the hypopharynx, consistent with acute pharyngitis. Mild thickening seen along the aryepiglottic folds bilaterally, suggesting a degree of associated supraglottitis. Epiglottis itself is relatively normal. No significant retropharyngeal swelling or collection. Mild narrowing of the supraglottic airway which measures 5 mm in AP diameter at its most narrow point. Glottis itself is within normal limits. Subglottic airway patent clear. Salivary glands: Salivary glands including the parotid and submandibular glands are within normal limits. Thyroid: Tiny 3 mm nodule noted within the right thyroid lobe, of doubtful significance given size and patient age, no follow-up imaging recommended (ref: J Am Coll Radiol. 2015 Feb;12(2): 143-50).Thyroid otherwise unremarkable. Lymph nodes:  No enlarged or pathologic adenopathy seen within the neck. Vascular: Normal intravascular enhancement seen throughout the neck. Moderate atherosclerotic change noted about the aortic arch, carotid bifurcations, and carotid siphons. Limited intracranial: Unremarkable. Visualized orbits: Unremarkable. Mastoids and visualized paranasal sinuses: Mild mucosal thickening noted within the ethmoidal air cells and right greater than left maxillary sinuses. Visualized paranasal sinuses are otherwise clear. Visualized mastoid air cells and middle ear cavities are well pneumatized and free of fluid. Skeleton: No acute osseous finding. No worrisome osseous lesions. Moderate multilevel cervical spondylosis without high-grade spinal stenosis. Few scattered dental caries noted about the remaining teeth. Upper chest: Visualized upper chest demonstrates no definite acute finding. Mild scattered atelectatic changes noted within the lungs. Curvilinear lucency adjacent to the proximal descending intrathoracic aorta favored to reflect atelectasis and/or pulse a shin artifact. Other: None. IMPRESSION: 1. Findings consistent with acute pharyngitis/supraglottitis with associated mild narrowing of the supraglottic airway as  above. Close clinical monitoring suggested as this patient is felt to potentially be at risk for airway compromise. No discrete abscess or drainable fluid collection. 2. No other acute abnormality within the neck. 3. Aortic Atherosclerosis (ICD10-I70.0). Electronically Signed   By: Jeannine Boga M.D.   On: 01/02/2021 03:50   Discharge Exam: General: Pt is alert, awake, not in acute distress Cardiovascular: RRR, S1/S2 +, no edema Respiratory: CTA bilaterally, no wheezing, no rhonchi, no respiratory distress, no conversational dyspnea  Abdominal: Soft, NT, ND, bowel sounds + HEENT: In the pharynx there is no edema, no erythema, exudates, asymmetry, drainage Psych: Normal mood and affect, stable judgement and  insight     The results of significant diagnostics from this hospitalization (including imaging, microbiology, ancillary and laboratory) are listed below for reference.     Microbiology: Recent Results (from the past 240 hour(s))  Group A Strep by PCR     Status: None   Collection Time: 01/02/21  1:38 AM   Specimen: Throat; Sterile Swab  Result Value Ref Range Status   Group A Strep by PCR NOT DETECTED NOT DETECTED Final    Comment: Performed at [facility], [place] [address] [place], [place] [address]  Resp Panel by RT-PCR (Flu A&B, Covid) Throat     Status: None   Collection Time: 01/02/21  1:40 AM   Specimen: Throat; Nasopharyngeal(NP) swabs in vial transport medium  Result Value Ref Range Status   SARS Coronavirus 2 by RT PCR NEGATIVE NEGATIVE Final    Comment: (NOTE) SARS-CoV-2 target nucleic acids are NOT DETECTED.  The SARS-CoV-2 RNA is generally detectable in upper respiratory specimens during the acute phase of infection. The lowest concentration of SARS-CoV-2 viral copies this assay can detect is 138 copies/mL. A negative result does not preclude SARS-Cov-2 infection and should not be used as the sole basis for treatment or other patient management decisions. A negative result may occur with  improper specimen collection/handling, submission of specimen other than nasopharyngeal swab, presence of viral mutation(s) within the areas targeted by this assay, and inadequate number of viral copies(<138 copies/mL). A negative result must be combined with clinical observations, patient history, and epidemiological information. The expected result is Negative.  Fact Sheet for Patients:  EntrepreneurPulse.com.au  Fact Sheet for Healthcare Providers:  IncredibleEmployment.be  This test is no t yet approved or cleared by the [place] FDA and  has been authorized for detection and/or diagnosis of SARS-CoV-2 by FDA  under an Emergency Use Authorization (EUA). This EUA will remain  in effect (meaning this test can be used) for the duration of the COVID-19 declaration under Section 564(b)(1) of the Act, 21 U.S.C.section 360bbb-3(b)(1), unless the authorization is terminated  or revoked sooner.       Influenza A by PCR NEGATIVE NEGATIVE Final   Influenza B by PCR NEGATIVE NEGATIVE Final    Comment: (NOTE) The Xpert Xpress SARS-CoV-2/FLU/RSV plus assay is intended as an aid in the diagnosis of influenza from Nasopharyngeal swab specimens and should not be used as a sole basis for treatment. Nasal washings and aspirates are unacceptable for Xpert Xpress SARS-CoV-2/FLU/RSV testing.  Fact Sheet for Patients: EntrepreneurPulse.com.au  Fact Sheet for Healthcare Providers: IncredibleEmployment.be  This test is not yet approved or cleared by the [place] FDA and has been authorized for detection and/or diagnosis of SARS-CoV-2 by FDA under an Emergency Use Authorization (EUA). This EUA will remain in effect (meaning this test can be used) for the duration of the  COVID-19 declaration under Section 564(b)(1) of the Act, 21 U.S.C. section 360bbb-3(b)(1), unless the authorization is terminated or revoked.  Performed at [facility], [place] [address], [place], [place] [address]      Labs: Basic Metabolic Panel: Recent Labs  Lab 01/02/21 0221  NA 142  K 3.3*  CL 96*  CO2 32  GLUCOSE 89  BUN 25*  CREATININE 11.16*  CALCIUM 10.0    CBC: Recent Labs  Lab 01/02/21 0221  WBC 7.8  NEUTROABS 5.6  HGB 9.8*  HCT 31.0*  MCV 99.4  PLT 235   CBG: Recent Labs  Lab 01/02/21 0829  GLUCAP 145*   Microbiology Recent Results (from the past 240 hour(s))  Group A Strep by PCR     Status: None   Collection Time: 01/02/21  1:38 AM   Specimen: Throat; Sterile Swab  Result Value Ref Range Status   Group A Strep by PCR NOT DETECTED NOT  DETECTED Final    Comment: Performed at [facility], [place] [address], [place], [place] [address]  Resp Panel by RT-PCR (Flu A&B, Covid) Throat     Status: None   Collection Time: 01/02/21  1:40 AM   Specimen: Throat; Nasopharyngeal(NP) swabs in vial transport medium  Result Value Ref Range Status   SARS Coronavirus 2 by RT PCR NEGATIVE NEGATIVE Final    Comment: (NOTE) SARS-CoV-2 target nucleic acids are NOT DETECTED.  The SARS-CoV-2 RNA is generally detectable in upper respiratory specimens during the acute phase of infection. The lowest concentration of SARS-CoV-2 viral copies this assay can detect is 138 copies/mL. A negative result does not preclude SARS-Cov-2 infection and should not be used as the sole basis for treatment or other patient management decisions. A negative result may occur with  improper specimen collection/handling, submission of specimen other than nasopharyngeal swab, presence of viral mutation(s) within the areas targeted by this assay, and inadequate number of viral copies(<138 copies/mL). A negative result must be combined with clinical observations, patient history, and epidemiological information. The expected result is Negative.  Fact Sheet for Patients:  EntrepreneurPulse.com.au  Fact Sheet for Healthcare Providers:  IncredibleEmployment.be  This test is no t yet approved or cleared by the [place] FDA and  has been authorized for detection and/or diagnosis of SARS-CoV-2 by FDA under an Emergency Use Authorization (EUA). This EUA will remain  in effect (meaning this test can be used) for the duration of the COVID-19 declaration under Section 564(b)(1) of the Act, 21 U.S.C.section 360bbb-3(b)(1), unless the authorization is terminated  or revoked sooner.       Influenza A by PCR NEGATIVE NEGATIVE Final   Influenza B by PCR NEGATIVE NEGATIVE Final    Comment: (NOTE) The Xpert Xpress  SARS-CoV-2/FLU/RSV plus assay is intended as an aid in the diagnosis of influenza from Nasopharyngeal swab specimens and should not be used as a sole basis for treatment. Nasal washings and aspirates are unacceptable for Xpert Xpress SARS-CoV-2/FLU/RSV testing.  Fact Sheet for Patients: EntrepreneurPulse.com.au  Fact Sheet for Healthcare Providers: IncredibleEmployment.be  This test is not yet approved or cleared by the [place] FDA and has been authorized for detection and/or diagnosis of SARS-CoV-2 by FDA under an Emergency Use Authorization (EUA). This EUA will remain in effect (meaning this test can be used) for the duration of the COVID-19 declaration under Section 564(b)(1) of the Act, 21 U.S.C. section 360bbb-3(b)(1), unless the authorization is terminated or revoked.  Performed at [facility],  [place] [address] [place], [place] [address]      Patient was seen and examined on the day of discharge and was found to be in stable condition. Time coordinating discharge: 25 minutes including assessment and coordination of care, as well as examination of the patient.   SIGNED:  Shelda Pal, DO [facility] 01/02/2021, 12:35 PM

## 2021-01-07 LAB — CULTURE, BLOOD (ROUTINE X 2)
Culture: NO GROWTH
Culture: NO GROWTH
Special Requests: ADEQUATE

## 2022-03-02 ENCOUNTER — Other Ambulatory Visit: Payer: Self-pay | Admitting: Family Medicine

## 2022-03-02 DIAGNOSIS — K862 Cyst of pancreas: Secondary | ICD-10-CM

## 2022-03-03 ENCOUNTER — Inpatient Hospital Stay: Admission: RE | Admit: 2022-03-03 | Payer: 59 | Source: Ambulatory Visit

## 2022-03-16 ENCOUNTER — Ambulatory Visit
Admission: RE | Admit: 2022-03-16 | Discharge: 2022-03-16 | Disposition: A | Discharge status: Home / Self Care | Payer: Medicare Other | Source: Ambulatory Visit | Attending: Family Medicine | Admitting: Family Medicine

## 2022-03-16 DIAGNOSIS — K862 Cyst of pancreas: Secondary | ICD-10-CM

## 2022-03-16 MED ORDER — IOPAMIDOL (ISOVUE-300) INJECTION 61%
100.0000 mL | Freq: Once | INTRAVENOUS | Status: AC | PRN
  Administered 2022-03-16: 100 mL via INTRAVENOUS

## 2022-04-03 ENCOUNTER — Other Ambulatory Visit: Payer: 59

## 2022-06-12 ENCOUNTER — Emergency Department (HOSPITAL_BASED_OUTPATIENT_CLINIC_OR_DEPARTMENT_OTHER)
Admission: EM | Admit: 2022-06-12 | Discharge: 2022-06-12 | Disposition: A | Discharge status: Home / Self Care | Payer: Medicare Other | Attending: Emergency Medicine | Admitting: Emergency Medicine

## 2022-06-12 ENCOUNTER — Encounter (HOSPITAL_BASED_OUTPATIENT_CLINIC_OR_DEPARTMENT_OTHER): Payer: Self-pay

## 2022-06-12 ENCOUNTER — Other Ambulatory Visit: Payer: Self-pay

## 2022-06-12 ENCOUNTER — Emergency Department (HOSPITAL_BASED_OUTPATIENT_CLINIC_OR_DEPARTMENT_OTHER): Payer: Medicare Other | Admitting: Radiology

## 2022-06-12 DIAGNOSIS — J101 Influenza due to other identified influenza virus with other respiratory manifestations: Secondary | ICD-10-CM | POA: Insufficient documentation

## 2022-06-12 DIAGNOSIS — Z1152 Encounter for screening for COVID-19: Secondary | ICD-10-CM | POA: Diagnosis not present

## 2022-06-12 DIAGNOSIS — R079 Chest pain, unspecified: Secondary | ICD-10-CM | POA: Diagnosis present

## 2022-06-12 DIAGNOSIS — R051 Acute cough: Secondary | ICD-10-CM

## 2022-06-12 DIAGNOSIS — R0789 Other chest pain: Secondary | ICD-10-CM | POA: Diagnosis not present

## 2022-06-12 LAB — BASIC METABOLIC PANEL
Anion gap: 13 (ref 5–15)
BUN: 17 mg/dL (ref 8–23)
CO2: 20 mmol/L — ABNORMAL LOW (ref 22–32)
Calcium: 9.7 mg/dL (ref 8.9–10.3)
Chloride: 108 mmol/L (ref 98–111)
Creatinine, Ser: 1.35 mg/dL — ABNORMAL HIGH (ref 0.61–1.24)
GFR, Estimated: 59 mL/min — ABNORMAL LOW (ref >60)
Glucose, Bld: 144 mg/dL — ABNORMAL HIGH (ref 70–99)
Potassium: 4.2 mmol/L (ref 3.5–5.1)
Sodium: 141 mmol/L (ref 135–145)

## 2022-06-12 LAB — CBC
HCT: 44.8 % (ref 39.0–52.0)
Hemoglobin: 14 g/dL (ref 13.0–17.0)
MCH: 28.2 pg (ref 26.0–34.0)
MCHC: 31.3 g/dL (ref 30.0–36.0)
MCV: 90.1 fL (ref 80.0–100.0)
Platelets: 188 10*3/uL (ref 150–400)
RBC: 4.97 MIL/uL (ref 4.22–5.81)
RDW: 15 % (ref 11.5–15.5)
WBC: 3.3 10*3/uL — ABNORMAL LOW (ref 4.0–10.5)
nRBC: 0 % (ref 0.0–0.2)

## 2022-06-12 LAB — TROPONIN I (HIGH SENSITIVITY): Troponin I (High Sensitivity): 5 ng/L (ref <18)

## 2022-06-12 LAB — RESP PANEL BY RT-PCR (RSV, FLU A&B, COVID)  RVPGX2
Influenza A by PCR: POSITIVE — AB
Influenza B by PCR: NEGATIVE
Resp Syncytial Virus by PCR: NEGATIVE
SARS Coronavirus 2 by RT PCR: NEGATIVE

## 2022-06-12 MED ORDER — ACETAMINOPHEN 500 MG PO TABS
1000.0000 mg | ORAL_TABLET | Freq: Once | ORAL | Status: AC
  Administered 2022-06-12: 1000 mg via ORAL
  Filled 2022-06-12: qty 2

## 2022-06-12 MED ORDER — BENZONATATE 100 MG PO CAPS: 200.0000 mg | ORAL_CAPSULE | Freq: Three times a day (TID) | ORAL | 0 refills | Status: DC

## 2022-06-12 NOTE — Discharge Instructions (Addendum)
You were seen in the emergency department for complaints of chest pain.  Based on the reassuring EKG as well as a normal chest x-ray and a positive influenza test, I believe that your chest pain is primarily due to the amount of coughing that he has been experiencing over the last several days from this influenza infection.  I have written a prescription for you that you can take for Tessalon at home to reduce the severity of this coughing to give you a chance to recover.  Please make sure to follow-up with your primary care provider within the next several days for further evaluation and assessment.

## 2022-06-12 NOTE — ED Triage Notes (Addendum)
Pt states that he is having right sided chest pain. Pt states that it feels like it goes through to his back . Pt states that he did a significant amount of driving this week. Pt has had this pain x 1 week.  Pt states cough x 1 week, as well as fever that has broken.   Of noted- kidney transplant October 2022 at [facility]

## 2022-06-12 NOTE — ED Provider Notes (Signed)
[place] EMERGENCY DEPT Provider Note   CSN: [id number] Arrival date & time: 06/12/22  1951     History Chief Complaint  Patient presents with   Chest Pain    [name] is a 61 y.o. male.   Chest Pain Associated symptoms: cough   Associated symptoms: no fatigue, no fever, no palpitations and no shortness of breath   Patient presented to the emergency department with complaints of right-sided chest pain as been present for several days.  Patient also reports that he recently had some kind of infection which resulted in a persistent cough for over a week with just recent resolution of fever.  He denies that this chest pain is radiating into either arm causing numbness or tingling.  Denies any history of any cardiac abnormalities but he is currently taking metoprolol.  Otherwise denies headache, shortness of breath, abdominal pain, nausea, vomiting, diarrhea, fever.     Home Medications Prior to Admission medications   Medication Sig Start Date End Date Taking? Authorizing Provider  benzonatate (TESSALON) 100 MG capsule Take 2 capsules (200 mg total) by mouth every 8 (eight) hours. 06/12/22  Yes Luvenia Heller, PA-C  acetaminophen (TYLENOL) 500 MG tablet Take 1,000 mg by mouth every 6 (six) hours as needed for moderate pain.    [provider]  amLODipine (NORVASC) 2.5 MG tablet Take 2.5 mg by mouth at bedtime. 10/18/20   [provider]  amLODipine (NORVASC) 5 MG tablet Take 5 mg by mouth at bedtime. 11/17/20   [provider]  augmented betamethasone dipropionate (DIPROLENE-AF) 0.05 % cream Apply 1 application topically 2 (two) times daily. 08/04/20   [provider]  AURYXIA 1 GM 210 MG(Fe) tablet Take 840 mg by mouth 3 (three) times daily with meals. 03/03/19   [provider]  cinacalcet (SENSIPAR) 30 MG tablet Take 30 mg by mouth daily. 11/14/19   [provider]  gabapentin (NEURONTIN) 300 MG capsule Take 300  mg by mouth 3 (three) times a week. 10/18/20   [provider]  hydrocortisone (ANUSOL-HC) 2.5 % rectal cream Place rectally 2 (two) times daily. 06/05/20   Pokhrel, Corrie Mckusick, MD  hydrOXYzine (ATARAX/VISTARIL) 25 MG tablet Take 1 tablet (25 mg total) by mouth every 6 (six) hours as needed for anxiety, itching or nausea. 06/05/20   Pokhrel, Corrie Mckusick, MD  LORazepam (ATIVAN) 1 MG tablet Take 1 mg by mouth every 8 (eight) hours as needed for anxiety.    [provider]  meclizine (ANTIVERT) 12.5 MG tablet Take 1 tablet (12.5 mg total) by mouth 3 (three) times daily as needed for dizziness. 12/02/20   Petrucelli, Samantha R, PA-C  melatonin 5 MG TABS Take 1 tablet (5 mg total) by mouth at bedtime as needed (sleep). 06/05/20   Pokhrel, Corrie Mckusick, MD  multivitamin (RENA-VIT) TABS tablet Take 1 tablet by mouth daily.    [provider]  pantoprazole (PROTONIX) 40 MG tablet Take 1 tablet (40 mg total) by mouth daily. 06/05/20 06/05/21  Pokhrel, Corrie Mckusick, MD  triamcinolone (KENALOG) 0.1 % Apply 1 application topically 2 (two) times daily. 03/15/20   [provider]  VELPHORO 500 MG chewable tablet Chew 1,000 mg by mouth 3 (three) times daily. 08/17/20   [provider]  diltiazem (CARDIZEM CD) 180 MG 24 hr capsule Take 1 capsule (180 mg total) by mouth daily. Patient not taking: No sig reported 04/24/19 01/02/21  Hayden Rasmussen, MD  metoprolol tartrate (LOPRESSOR) 25 MG tablet Take 1 tablet (  25 mg total) by mouth 2 (two) times daily. Patient not taking: Reported on 06/04/2020 03/26/19 01/02/21  Ward, Delice Bison, DO      Allergies    Penicillins    Review of Systems   Review of Systems  Constitutional:  Negative for fatigue and fever.  Respiratory:  Positive for cough and wheezing. Negative for chest tightness and shortness of breath.   Cardiovascular:  Positive for chest pain. Negative for palpitations.    Physical Exam Updated Vital Signs BP 121/79   Pulse 71   Temp  98.4 F (36.9 C) (Oral)   Resp 16   Ht '5\' 11"'$  (1.803 m)   Wt 115.7 kg   SpO2 100%   BMI 35.57 kg/m  Physical Exam  ED Results / Procedures / Treatments   Labs (all labs ordered are listed, but only abnormal results are displayed) Labs Reviewed  RESP PANEL BY RT-PCR (RSV, FLU A&B, COVID)  RVPGX2 - Abnormal; Notable for the following components:      Result Value   Influenza A by PCR POSITIVE (*)    All other components within normal limits  BASIC METABOLIC PANEL - Abnormal; Notable for the following components:   CO2 20 (*)    Glucose, Bld 144 (*)    Creatinine, Ser 1.35 (*)    GFR, Estimated 59 (*)    All other components within normal limits  CBC - Abnormal; Notable for the following components:   WBC 3.3 (*)    All other components within normal limits  TROPONIN I (HIGH SENSITIVITY)    EKG EKG Interpretation  Date/Time:  Monday June 12 2022 20:04:56 EST Ventricular Rate:  79 PR Interval:  202 QRS Duration: 94 QT Interval:  386 QTC Calculation: 442 R Axis:   -13 Text Interpretation: Normal sinus rhythm Minimal voltage criteria for LVH, may be normal variant ( R in aVL ) Borderline ECG No significant change since last tracing Confirmed by Deno Etienne [phone]) on 06/12/2022 9:12:15 PM  Radiology DG Chest 2 View  Result Date: 06/12/2022 CLINICAL DATA:  Right-sided chest pain EXAM: CHEST - 2 VIEW COMPARISON:  01/02/2021 FINDINGS: Stable cardiomediastinal silhouette. Bibasilar atelectasis/scarring. No focal pneumonia, pleural effusion, or pneumothorax. No acute osseous abnormality. Elevated left hemidiaphragm. IMPRESSION: No active cardiopulmonary disease. Electronically Signed   By: Placido Sou M.D.   On: 06/12/2022 20:25    Procedures Procedures   Medications Ordered in ED Medications  acetaminophen (TYLENOL) tablet 1,000 mg (has no administration in time range)    ED Course/ Medical Decision Making/ A&P                           Medical Decision  Making Amount and/or Complexity of Data Reviewed Labs: ordered. Radiology: ordered.  Risk Prescription drug management.   This patient presents to the ED for concern of chest pain.  Differential diagnosis includes ACS, pneumothorax, pneumonia, bronchitis, viral URI   Lab Tests:  I Ordered, and personally interpreted labs.  The pertinent results include: Influenza A positive, slightly decreased GFR at 59   Imaging Studies ordered:  I ordered imaging studies including chest x-ray I independently visualized and interpreted imaging which showed no active cardiopulmonary disease I agree with the radiologist interpretation  EKG Interpretation:  I personally interpreted the EKG which showed normal sinus rhythm  Medicines ordered and prescription drug management:  I ordered medication including Tessalon for cough Reevaluation of the patient after these medicines showed that the  patient proved I have reviewed the patients home medicines and have made adjustments as needed   Problem List / ED Course:  Presented the emergency department concerns for right-sided chest pain which is aggravated by coughing.  Patient was also recently sick with a viral infection was only just recently discovered to be influenza A positive today during ED visit.  Patient denies that he is currently experiencing lingering symptoms from influenza as his fever is now resolved and only lingering complaint is a cough.  Patient denies any radiating chest pain into the shoulders or neck.  Based on reassuring chest x-ray and EKG as well as all lab work I believe patient is currently safe to discharge home with a prescription for Tessalon that he can take to reduce the severity extent of his cough over the next 2 days to allow his chest wall time to recover.  Patient verbalized understanding treatment plan was agreeable to discharge home.  Patient did not have any further questions at the end of this encounter.   Final  Clinical Impression(s) / ED Diagnoses Final diagnoses:  Acute cough  Chest wall pain  Influenza A    Rx / DC Orders ED Discharge Orders          Ordered    benzonatate (TESSALON) 100 MG capsule  Every 8 hours        06/12/22 2203              Luvenia Heller, PA-C 06/12/22 2236    Deno Etienne, DO 06/12/22 2250

## 2022-11-14 ENCOUNTER — Emergency Department (HOSPITAL_COMMUNITY): Payer: Medicare Other

## 2022-11-14 ENCOUNTER — Inpatient Hospital Stay (HOSPITAL_COMMUNITY)
Admission: EM | Admit: 2022-11-14 | Discharge: 2022-11-17 | DRG: 871 | Disposition: A | Discharge status: Home / Self Care | Payer: Medicare Other | Attending: Family Medicine | Admitting: Family Medicine

## 2022-11-14 ENCOUNTER — Other Ambulatory Visit: Payer: Self-pay

## 2022-11-14 DIAGNOSIS — Y830 Surgical operation with transplant of whole organ as the cause of abnormal reaction of the patient, or of later complication, without mention of misadventure at the time of the procedure: Secondary | ICD-10-CM | POA: Diagnosis present

## 2022-11-14 DIAGNOSIS — Z79899 Other long term (current) drug therapy: Secondary | ICD-10-CM

## 2022-11-14 DIAGNOSIS — R6520 Severe sepsis without septic shock: Secondary | ICD-10-CM | POA: Diagnosis present

## 2022-11-14 DIAGNOSIS — Z79621 Long term (current) use of calcineurin inhibitor: Secondary | ICD-10-CM

## 2022-11-14 DIAGNOSIS — Z940 Kidney transplant status: Secondary | ICD-10-CM | POA: Diagnosis not present

## 2022-11-14 DIAGNOSIS — Z1152 Encounter for screening for COVID-19: Secondary | ICD-10-CM

## 2022-11-14 DIAGNOSIS — N179 Acute kidney failure, unspecified: Secondary | ICD-10-CM | POA: Diagnosis present

## 2022-11-14 DIAGNOSIS — E785 Hyperlipidemia, unspecified: Secondary | ICD-10-CM | POA: Diagnosis present

## 2022-11-14 DIAGNOSIS — T8619 Other complication of kidney transplant: Secondary | ICD-10-CM | POA: Diagnosis present

## 2022-11-14 DIAGNOSIS — M479 Spondylosis, unspecified: Secondary | ICD-10-CM | POA: Diagnosis present

## 2022-11-14 DIAGNOSIS — T8613 Kidney transplant infection: Secondary | ICD-10-CM | POA: Diagnosis present

## 2022-11-14 DIAGNOSIS — E669 Obesity, unspecified: Secondary | ICD-10-CM | POA: Diagnosis present

## 2022-11-14 DIAGNOSIS — Z9079 Acquired absence of other genital organ(s): Secondary | ICD-10-CM

## 2022-11-14 DIAGNOSIS — E119 Type 2 diabetes mellitus without complications: Secondary | ICD-10-CM

## 2022-11-14 DIAGNOSIS — B9620 Unspecified Escherichia coli [E. coli] as the cause of diseases classified elsewhere: Secondary | ICD-10-CM | POA: Diagnosis present

## 2022-11-14 DIAGNOSIS — R7989 Other specified abnormal findings of blood chemistry: Secondary | ICD-10-CM | POA: Diagnosis present

## 2022-11-14 DIAGNOSIS — I10 Essential (primary) hypertension: Secondary | ICD-10-CM

## 2022-11-14 DIAGNOSIS — G9340 Encephalopathy, unspecified: Secondary | ICD-10-CM | POA: Diagnosis present

## 2022-11-14 DIAGNOSIS — A419 Sepsis, unspecified organism: Principal | ICD-10-CM

## 2022-11-14 DIAGNOSIS — Z880 Allergy status to penicillin: Secondary | ICD-10-CM

## 2022-11-14 DIAGNOSIS — R109 Unspecified abdominal pain: Secondary | ICD-10-CM | POA: Diagnosis not present

## 2022-11-14 DIAGNOSIS — E1122 Type 2 diabetes mellitus with diabetic chronic kidney disease: Secondary | ICD-10-CM | POA: Diagnosis present

## 2022-11-14 DIAGNOSIS — G4733 Obstructive sleep apnea (adult) (pediatric): Secondary | ICD-10-CM | POA: Diagnosis present

## 2022-11-14 DIAGNOSIS — N12 Tubulo-interstitial nephritis, not specified as acute or chronic: Principal | ICD-10-CM | POA: Diagnosis present

## 2022-11-14 DIAGNOSIS — M109 Gout, unspecified: Secondary | ICD-10-CM | POA: Diagnosis present

## 2022-11-14 DIAGNOSIS — Z7952 Long term (current) use of systemic steroids: Secondary | ICD-10-CM

## 2022-11-14 DIAGNOSIS — Z888 Allergy status to other drugs, medicaments and biological substances status: Secondary | ICD-10-CM

## 2022-11-14 DIAGNOSIS — Z7969 Long term (current) use of other immunomodulators and immunosuppressants: Secondary | ICD-10-CM

## 2022-11-14 DIAGNOSIS — N182 Chronic kidney disease, stage 2 (mild): Secondary | ICD-10-CM | POA: Diagnosis present

## 2022-11-14 DIAGNOSIS — I129 Hypertensive chronic kidney disease with stage 1 through stage 4 chronic kidney disease, or unspecified chronic kidney disease: Secondary | ICD-10-CM | POA: Diagnosis present

## 2022-11-14 DIAGNOSIS — Z6835 Body mass index (BMI) 35.0-35.9, adult: Secondary | ICD-10-CM

## 2022-11-14 DIAGNOSIS — M48061 Spinal stenosis, lumbar region without neurogenic claudication: Principal | ICD-10-CM

## 2022-11-14 DIAGNOSIS — K219 Gastro-esophageal reflux disease without esophagitis: Secondary | ICD-10-CM | POA: Diagnosis present

## 2022-11-14 DIAGNOSIS — N390 Urinary tract infection, site not specified: Secondary | ICD-10-CM

## 2022-11-14 DIAGNOSIS — I2510 Atherosclerotic heart disease of native coronary artery without angina pectoris: Secondary | ICD-10-CM | POA: Diagnosis present

## 2022-11-14 DIAGNOSIS — G9341 Metabolic encephalopathy: Secondary | ICD-10-CM | POA: Diagnosis present

## 2022-11-14 DIAGNOSIS — E876 Hypokalemia: Secondary | ICD-10-CM | POA: Diagnosis present

## 2022-11-14 LAB — COMPREHENSIVE METABOLIC PANEL
ALT: 17 U/L (ref 0–44)
AST: 21 U/L (ref 15–41)
Albumin: 4 g/dL (ref 3.5–5.0)
Alkaline Phosphatase: 95 U/L (ref 38–126)
Anion gap: 9 (ref 5–15)
BUN: 10 mg/dL (ref 8–23)
CO2: 21 mmol/L — ABNORMAL LOW (ref 22–32)
Calcium: 9.3 mg/dL (ref 8.9–10.3)
Chloride: 107 mmol/L (ref 98–111)
Creatinine, Ser: 1.59 mg/dL — ABNORMAL HIGH (ref 0.61–1.24)
GFR, Estimated: 48 mL/min — ABNORMAL LOW (ref >60)
Glucose, Bld: 126 mg/dL — ABNORMAL HIGH (ref 70–99)
Potassium: 4.1 mmol/L (ref 3.5–5.1)
Sodium: 137 mmol/L (ref 135–145)
Total Bilirubin: 1.8 mg/dL — ABNORMAL HIGH (ref 0.3–1.2)
Total Protein: 6.9 g/dL (ref 6.5–8.1)

## 2022-11-14 LAB — LACTIC ACID, PLASMA
Lactic Acid, Venous: 3.2 mmol/L (ref 0.5–1.9)
Lactic Acid, Venous: 1.9 mmol/L (ref 0.5–1.9)

## 2022-11-14 LAB — URINALYSIS, ROUTINE W REFLEX MICROSCOPIC
Bilirubin Urine: NEGATIVE
Glucose, UA: NEGATIVE mg/dL
Ketones, ur: NEGATIVE mg/dL
Nitrite: POSITIVE — AB
Protein, ur: 100 mg/dL — AB
Specific Gravity, Urine: 1.021 (ref 1.005–1.030)
WBC, UA: >50 WBC/hpf (ref 0–5)
pH: 5 (ref 5.0–8.0)

## 2022-11-14 LAB — CBC WITH DIFFERENTIAL/PLATELET
Abs Immature Granulocytes: 0.06 10*3/uL (ref 0.00–0.07)
Basophils Absolute: 0 10*3/uL (ref 0.0–0.1)
Basophils Relative: 0 %
Eosinophils Absolute: 0 10*3/uL (ref 0.0–0.5)
Eosinophils Relative: 0 %
HCT: 50.4 % (ref 39.0–52.0)
Hemoglobin: 15.4 g/dL (ref 13.0–17.0)
Immature Granulocytes: 1 %
Lymphocytes Relative: 7 %
Lymphs Abs: 0.8 10*3/uL (ref 0.7–4.0)
MCH: 28.4 pg (ref 26.0–34.0)
MCHC: 30.6 g/dL (ref 30.0–36.0)
MCV: 92.8 fL (ref 80.0–100.0)
Monocytes Absolute: 0.5 10*3/uL (ref 0.1–1.0)
Monocytes Relative: 5 %
Neutro Abs: 9.6 10*3/uL — ABNORMAL HIGH (ref 1.7–7.7)
Neutrophils Relative %: 87 %
Platelets: 151 10*3/uL (ref 150–400)
RBC: 5.43 MIL/uL (ref 4.22–5.81)
RDW: 14.7 % (ref 11.5–15.5)
WBC: 11.1 10*3/uL — ABNORMAL HIGH (ref 4.0–10.5)
nRBC: 0 % (ref 0.0–0.2)

## 2022-11-14 LAB — SARS CORONAVIRUS 2 BY RT PCR: SARS Coronavirus 2 by RT PCR: NEGATIVE

## 2022-11-14 MED ORDER — VANCOMYCIN HCL 2000 MG/400ML IV SOLN
2000.0000 mg | Freq: Once | INTRAVENOUS | Status: AC
  Administered 2022-11-14: 2000 mg via INTRAVENOUS
  Filled 2022-11-14: qty 400

## 2022-11-14 MED ORDER — PREDNISONE 5 MG PO TABS
15.0000 mg | ORAL_TABLET | Freq: Every day | ORAL | Status: DC
  Administered 2022-11-15: 15 mg via ORAL
  Filled 2022-11-14: qty 1

## 2022-11-14 MED ORDER — SODIUM CHLORIDE 0.9 % IV SOLN
1.0000 g | Freq: Once | INTRAVENOUS | Status: DC
  Filled 2022-11-14: qty 10

## 2022-11-14 MED ORDER — MORPHINE SULFATE (PF) 4 MG/ML IV SOLN
4.0000 mg | Freq: Once | INTRAVENOUS | Status: AC
  Administered 2022-11-14: 4 mg via INTRAVENOUS
  Filled 2022-11-14: qty 1

## 2022-11-14 MED ORDER — METRONIDAZOLE 500 MG/100ML IV SOLN
500.0000 mg | Freq: Once | INTRAVENOUS | Status: AC
  Administered 2022-11-14: 500 mg via INTRAVENOUS
  Filled 2022-11-14: qty 100

## 2022-11-14 MED ORDER — LORAZEPAM 1 MG PO TABS
2.0000 mg | ORAL_TABLET | ORAL | Status: AC
  Administered 2022-11-14: 2 mg via ORAL
  Filled 2022-11-14: qty 2

## 2022-11-14 MED ORDER — LACTATED RINGERS IV SOLN
150.0000 mL/h | INTRAVENOUS | Status: AC
  Administered 2022-11-14 – 2022-11-15 (×3): 150 mL/h via INTRAVENOUS

## 2022-11-14 MED ORDER — TACROLIMUS 1 MG PO CAPS
1.0000 mg | ORAL_CAPSULE | Freq: Two times a day (BID) | ORAL | Status: DC
  Administered 2022-11-15 – 2022-11-17 (×6): 1 mg via ORAL
  Filled 2022-11-14 (×6): qty 1

## 2022-11-14 MED ORDER — SODIUM CHLORIDE 0.9 % IV SOLN
2.0000 g | Freq: Once | INTRAVENOUS | Status: AC
  Administered 2022-11-14: 2 g via INTRAVENOUS
  Filled 2022-11-14: qty 12.5

## 2022-11-14 MED ORDER — VANCOMYCIN HCL IN DEXTROSE 1-5 GM/200ML-% IV SOLN
1000.0000 mg | Freq: Once | INTRAVENOUS | Status: DC
  Filled 2022-11-14: qty 200

## 2022-11-14 MED ORDER — ACETAMINOPHEN 500 MG PO TABS
1000.0000 mg | ORAL_TABLET | Freq: Once | ORAL | Status: AC
  Administered 2022-11-14: 1000 mg via ORAL
  Filled 2022-11-14: qty 2

## 2022-11-14 MED ORDER — LACTATED RINGERS IV BOLUS
1000.0000 mL | Freq: Once | INTRAVENOUS | Status: AC
  Administered 2022-11-14: 1000 mL via INTRAVENOUS

## 2022-11-14 MED ORDER — HYDROCORTISONE SOD SUC (PF) 100 MG IJ SOLR
100.0000 mg | Freq: Once | INTRAMUSCULAR | Status: AC
  Administered 2022-11-14: 100 mg via INTRAVENOUS
  Filled 2022-11-14: qty 2

## 2022-11-14 MED ORDER — ONDANSETRON 4 MG PO TBDP
4.0000 mg | ORAL_TABLET | Freq: Once | ORAL | Status: AC
  Administered 2022-11-14: 4 mg via ORAL
  Filled 2022-11-14: qty 1

## 2022-11-14 MED ORDER — ENOXAPARIN SODIUM 40 MG/0.4ML IJ SOSY
40.0000 mg | PREFILLED_SYRINGE | INTRAMUSCULAR | Status: DC
  Administered 2022-11-15 – 2022-11-16 (×2): 40 mg via SUBCUTANEOUS
  Filled 2022-11-14 (×2): qty 0.4

## 2022-11-14 MED ORDER — GADOBUTROL 1 MMOL/ML IV SOLN
10.0000 mL | Freq: Once | INTRAVENOUS | Status: AC | PRN
  Administered 2022-11-14: 10 mL via INTRAVENOUS

## 2022-11-14 MED ORDER — SODIUM CHLORIDE 0.9 % IV SOLN
2.0000 g | INTRAVENOUS | Status: DC
  Administered 2022-11-15 – 2022-11-16 (×2): 2 g via INTRAVENOUS
  Filled 2022-11-14 (×2): qty 20

## 2022-11-14 MED ORDER — IOHEXOL 350 MG/ML SOLN
60.0000 mL | Freq: Once | INTRAVENOUS | Status: AC | PRN
  Administered 2022-11-14: 60 mL via INTRAVENOUS

## 2022-11-14 MED ORDER — MORPHINE SULFATE (PF) 2 MG/ML IV SOLN
2.0000 mg | INTRAVENOUS | Status: DC | PRN
  Administered 2022-11-14: 4 mg via INTRAVENOUS
  Filled 2022-11-14: qty 2

## 2022-11-14 MED ORDER — ACETAMINOPHEN 10 MG/ML IV SOLN
1000.0000 mg | Freq: Once | INTRAVENOUS | Status: AC
  Administered 2022-11-15: 1000 mg via INTRAVENOUS
  Filled 2022-11-14: qty 100

## 2022-11-14 MED ORDER — LIDOCAINE 5 % EX PTCH
1.0000 | MEDICATED_PATCH | CUTANEOUS | Status: DC
  Administered 2022-11-14 – 2022-11-17 (×4): 1 via TRANSDERMAL
  Filled 2022-11-14 (×5): qty 1

## 2022-11-14 MED ORDER — MYCOPHENOLATE SODIUM 180 MG PO TBEC
360.0000 mg | DELAYED_RELEASE_TABLET | Freq: Two times a day (BID) | ORAL | Status: DC
  Administered 2022-11-15 (×2): 360 mg via ORAL
  Filled 2022-11-14 (×3): qty 2

## 2022-11-14 MED ORDER — TACROLIMUS 0.5 MG PO CAPS
0.5000 mg | ORAL_CAPSULE | Freq: Every morning | ORAL | Status: DC
  Administered 2022-11-15 – 2022-11-17 (×3): 0.5 mg via ORAL
  Filled 2022-11-14 (×4): qty 1

## 2022-11-14 NOTE — Assessment & Plan Note (Addendum)
On my evaluation: pt seems somewhat lethargic.  Opens eyes to voice, before going back to sleep rather quickly. Suspect this sedation is secondary to 8mg  morphine + Ativan (for MRI) he has gotten in ED thus far. DDx includes delirium secondary to sepsis. Doesn't seem to have any focal neuro deficits on my limited exam to make me think stroke or other acute neurologic emergency at this point.

## 2022-11-14 NOTE — H&P (Addendum)
History and Physical    Patient: [name] YNW:[id number] DOB: 09-09-1960 DOA: 11/14/2022 DOS: the patient was seen and examined on 11/14/2022 PCP: Burton Apley, MD  Patient coming from: Home  Chief Complaint:  Chief Complaint  Patient presents with   Flank Pain   HPI: [name] is a 62 y.o. 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  Pt presents to ED with 3 week h/o R sided flank pain.  Worse with movement.  Says initially it would come and go but in the past 24 hours to start to become constant. Will occasionally radiate down his right leg. Denies any leg weakness or numbness. No bowel or bladder incontinence.   Compliant with all anti-rejection meds and bactrim for infection PPx.  On my evaluation: pt seems somewhat lethargic.  Opens eyes to voice, before going back to sleep rather quickly.  Could just be from morphine.  Wife thinks he's just tired out from events thus far today.  Generalized weakness on trying to stand today, but nothing focal (not left or right sided).  No facial droop per wife.  No skin concerns.   Review of Systems: unable to review all systems due to the inability of the patient to answer questions. Past Medical History:  Diagnosis Date   Anemia 10/01/2015   Arthritis    SPINE   Borderline diabetes mellitus    Diabetes mellitus without complication (HCC)    borderline on no meds    Dialysis patient [facility])    Elevated PSA    ESRD on hemodialysis (HCC) 06/04/2020   GERD without esophagitis 01/02/2021   Gout    STABLE  PER PT 10-22-2013   H/O hiatal hernia    Hyperlipidemia    Hypertension    Microhematuria    Nocturia    OSA (obstructive sleep apnea)    PER PT STUDY DONE 2005 (APPROX).  NON- COMPLIANT CPAP   SVT (supraventricular tachycardia)    Wears glasses    Past Surgical History:  Procedure Laterality Date   AV FISTULA PLACEMENT Left  08/17/2015   Procedure: BRACHIOCEPHALIC ARTERIOVENOUS (AV) FISTULA CREATION  left arm;  Surgeon: Fransisco Hertz, MD;  Location: [facility] OR;  Service: Vascular;  Laterality: Left;   CARDIAC CATHETERIZATION  10-04-2006   DR Alanda Amass   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%/  2ndDIAGONAL 30%/  RCA 40%/  EF 60%   CARDIAC CATHETERIZATION  04-07-2002  DR [facility]   NON-CRITICAL CAD/  PRESERVED LV   CYSTOSCOPY W/ RETROGRADES Bilateral 10/27/2013   Procedure: CYSTOSCOPY WITH RETROGRADE PYELOGRAM;  Surgeon: Milford Cage, MD;  Location: [facility];  Service: Urology;  Laterality: Bilateral;   LYMPHADENECTOMY Bilateral 12/18/2013   Procedure: PELVIC LYMPH NODE DISSECTION;  Surgeon: Sebastian Ache, MD;  Location: [facility] ORS;  Service: Urology;  Laterality: Bilateral;   PROSTATE BIOPSY N/A 10/27/2013   Procedure: BIOPSY TRANSRECTAL ULTRASONIC PROSTATE (TUBP);  Surgeon: Milford Cage, MD;  Location: [facility];  Service: Urology;  Laterality: N/A;   PROSTATE SURGERY     RIGHT HAND SURGERY  YRS AGO   ROBOT ASSISTED LAPAROSCOPIC RADICAL PROSTATECTOMY N/A 12/18/2013   Procedure: ROBOTIC ASSISTED LAPAROSCOPIC RADICAL PROSTATECTOMY AND INDOCYANINE GREEN DYE;  Surgeon: Sebastian Ache, MD;  Location: [facility] ORS;  Service: Urology;  Laterality: N/A;   TRANSTHORACIC ECHOCARDIOGRAM  07-03-2006   MILD - MODERATE LVH/  EF 55-60%/  GRADE I DIASTOIC DYSFUNCTION/  TRIVIAL  TR /  TRIVIAL PERICARDIAL EFFUSION   Social History:  reports that he has never smoked. He has never used smokeless tobacco. He reports that he does not drink alcohol and does not use drugs.  Allergies  Allergen Reactions   Gabapentin     Other Reaction(s): Other (See Comments)  Patient reports 300mg  dose made him "feel drunk, like vertigo."  Patient reports he can tolerate lower doses (100mg ).  Patient reports 300mg  dose made him "feel drunk, like vertigo."  Patient reports he can tolerate lower doses (100mg ). States he did not  wake up for 2 days after taking.  Patient reports 300mg  dose made him "feel drunk, like vertigo." Patient reports he can tolerate lower doses (100mg ).    Patient reports 300mg  dose made him "feel drunk, like vertigo." Patient reports he can tolerate lower doses (100mg ). States he did not wake up for 2 days after taking.   Penicillins Nausea And Vomiting and Rash    Has patient had a PCN reaction causing immediate rash, facial/tongue/throat swelling, SOB or lightheadedness with hypotension: yes Has patient had a PCN reaction causing severe rash involving mucus membranes or skin necrosis: no Has patient had a PCN reaction that required hospitalization: no Has patient had a PCN reaction occurring within the last 10 years: No If all of the above answers are "NO", then may proceed with Cephalosporin use.     Family History  Problem Relation Age of Onset   Other Mother        shot and killed   Cirrhosis Father     Prior to Admission medications   Medication Sig Start Date End Date Taking? Authorizing Provider  amLODipine (NORVASC) 10 MG tablet Take 10 mg by mouth daily.   Yes [provider]  atorvastatin (LIPITOR) 80 MG tablet Take 1 tablet by mouth daily. Take one tablet on Monday, Wednesday, and Friday. 04/11/21 05/10/23 Yes [provider]  metoprolol tartrate (LOPRESSOR) 25 MG tablet Take 25 mg by mouth 2 (two) times daily.   Yes [provider]  mycophenolate (MYFORTIC) 180 MG EC tablet Take 360 mg by mouth 2 (two) times daily.   Yes [provider]  predniSONE (DELTASONE) 5 MG tablet Take 5 mg by mouth daily with breakfast.   Yes [provider]  sulfamethoxazole-trimethoprim (BACTRIM) 400-80 MG tablet Take 1 tablet by mouth 3 (three) times a week. Take one on  Monday, Wednesday, and Friday.   Yes [provider]  tacrolimus (PROGRAF) 0.5 MG capsule Take 0.5 mg by mouth in the morning.   Yes [provider]  tacrolimus  (PROGRAF) 1 MG capsule Take 1 mg by mouth 2 (two) times daily.   Yes [provider]  acetaminophen (TYLENOL) 500 MG tablet Take 1,000 mg by mouth every 6 (six) hours as needed for moderate pain.    [provider]  diltiazem (CARDIZEM CD) 180 MG 24 hr capsule Take 1 capsule (180 mg total) by mouth daily. Patient not taking: No sig reported 04/24/19 01/02/21  Terrilee Files, MD    Physical Exam: Vitals:   11/14/22 2030 11/14/22 2045 11/14/22 2100 11/14/22 2135  BP: 117/89  124/87   Pulse: 91 95 94   Resp: (!) 29 (!) 25 (!) 23   Temp:    100.3 F (37.9 C)  TempSrc:    Oral  SpO2: 97% 97% 98%    Constitutional: Pt ill appearing and somewhat lethargic currently.  Ill appearing Respiratory: Tachypnic but clear  Cardiovascular: Tachycardic Abdomen: Scars from renal transplant RLQ. Neurologic: Answers questions with enough stimulation.  MAE, no obvious focal deficit, tongue protrusion midline, no obvious facial droop.   Psychiatric: lethargic, but wakes up with enough stimiulation.  Oriented to self and location.  Data Reviewed:    Labs on Admission: I have personally reviewed following labs and imaging studies  CBC: Recent Labs  Lab 11/14/22 1339  WBC 11.1*  NEUTROABS 9.6*  HGB 15.4  HCT 50.4  MCV 92.8  PLT 151   Basic Metabolic Panel: Recent Labs  Lab 11/14/22 1339  NA 137  K 4.1  CL 107  CO2 21*  GLUCOSE 126*  BUN 10  CREATININE 1.59*  CALCIUM 9.3   GFR: CrCl cannot be calculated (Unknown ideal weight.). Liver Function Tests: Recent Labs  Lab 11/14/22 1339  AST 21  ALT 17  ALKPHOS 95  BILITOT 1.8*  PROT 6.9  ALBUMIN 4.0    Urine analysis:    Component Value Date/Time   COLORURINE YELLOW 11/14/2022 1608   APPEARANCEUR HAZY (A) 11/14/2022 1608   LABSPEC 1.021 11/14/2022 1608   PHURINE 5.0 11/14/2022 1608   GLUCOSEU NEGATIVE 11/14/2022 1608   HGBUR MODERATE (A) 11/14/2022 1608   BILIRUBINUR NEGATIVE 11/14/2022 1608   KETONESUR  NEGATIVE 11/14/2022 1608   PROTEINUR 100 (A) 11/14/2022 1608   UROBILINOGEN 0.2 09/22/2014 1346   NITRITE POSITIVE (A) 11/14/2022 1608   LEUKOCYTESUR MODERATE (A) 11/14/2022 1608    Radiological Exams on Admission: MR Lumbar Spine W Wo Contrast  Result Date: 11/14/2022 CLINICAL DATA:  Back pain radiating down right lower extremity, immunocompromised, fever EXAM: MRI LUMBAR SPINE WITHOUT AND WITH CONTRAST TECHNIQUE: Multiplanar and multiecho pulse sequences of the lumbar spine were obtained without and with intravenous contrast. CONTRAST:  10mL GADAVIST GADOBUTROL 1 MMOL/ML IV SOLN COMPARISON:  12/25/2017 MRI lumbar spine and 04/22/2022 CT lumbar spine FINDINGS: Segmentation:  5 lumbar type vertebral bodies. Alignment:  No significant listhesis.  Levocurvature. Vertebrae: No acute fracture or evidence of discitis. Redemonstrated T2 hyperintense lesion in the inferior aspect of L1, which appears similar to the prior exam and is favored to represent an atypical hemangioma. Progressive Schmorl's node formation at the endplates at L2-L3, L3-L4, and L4-L5, with additional endplate degenerative changes at L4-L5. No suspicious osseous lesion. No abnormal osseous enhancement. Conus medullaris and cauda equina: Conus extends to the L1-L2 level. Conus and cauda equina appear normal. No abnormal enhancement. Paraspinal and other soft tissues: Atrophic native kidneys. Right lower quadrant transplant kidney no lymphadenopathy. Disc levels: T12-L1: No significant disc bulge. Mild facet arthropathy. No spinal canal stenosis or neural foraminal narrowing. L1-L2: No significant disc bulge. No spinal canal stenosis or neural foraminal narrowing. L2-L3: Mild to moderate disc bulge with right foraminal/extreme lateral disc protrusion. Mild facet arthropathy. Ligamentum flavum hypertrophy. Narrowing of the lateral recesses. Mild-to-moderate spinal canal stenosis, which has progressed slightly from the prior exam. No neural  foraminal narrowing. Posterior to the L3 vertebral body, there is mild to moderate thecal sac narrowing secondary to epidural lipomatosis (series 8, image 20). L3-L4: Mild disc bulge with right foraminal/extreme lateral disc protrusion, which likely contacts the exiting right L3 nerve. Mild facet arthropathy. Ligamentum flavum hypertrophy. Mild spinal canal stenosis and mild right neural foraminal narrowing, unchanged. Posterior to the L4 vertebral body, there is moderate thecal sac narrowing, in part secondary to epidural lipomatosis (series 8, image 26). L4-L5: Mild disc bulge, somewhat eccentric to the left. Mild-to-moderate facet arthropathy. Narrowing of the lateral recesses. Mild  thecal sac narrowing, in part secondary to epidural lipomatosis, new from prior exam. Mild-to-moderate left neural foraminal narrowing, unchanged. Mild thecal sac narrowing is also noted posterior to the L5 vertebral body, secondary to epidural lipomatosis. L5-S1: Mild disc bulge, which may contact the exiting right greater than left L5 nerves. Superimposed central disc protrusion. Mild facet arthropathy. No spinal canal stenosis or neural foraminal narrowing. IMPRESSION: 1. No evidence of discitis or osteomyelitis. No epidural abscess. 2. L2-L3 mild-to-moderate spinal canal stenosis, which has progressed slightly from the prior exam. Narrowing of the lateral recesses could affect the descending L3 nerve roots. 3. L3-L4 mild spinal canal stenosis and mild right neural foraminal narrowing, unchanged. 4. L4-L5 mild-to-moderate left neural foraminal narrowing, unchanged. Narrowing of the lateral recesses at this level could affect the descending L5 nerve roots 5. L5-S1 disc bulge may contact the exiting right greater than left L5 nerves. 6. Epidural lipomatosis causes thecal sac narrowing posterior to the L3, L4, and L5 vertebral bodies, which is moderate at L4 and mild at L3 and L5. 7. Progressive Schmorl's node formation at L2-L3,  L3-L4, and L4-L5, which can be a cause back pain. Electronically Signed   By: Wiliam Ke M.D.   On: 11/14/2022 20:11   CT ABDOMEN PELVIS W CONTRAST  Result Date: 11/14/2022 CLINICAL DATA:  Right flank pain. EXAM: CT ABDOMEN AND PELVIS WITH CONTRAST TECHNIQUE: Multidetector CT imaging of the abdomen and pelvis was performed using the standard protocol following bolus administration of intravenous contrast. RADIATION DOSE REDUCTION: This exam was performed according to the departmental dose-optimization program which includes automated exposure control, adjustment of the mA and/or kV according to patient size and/or use of iterative reconstruction technique. CONTRAST:  60mL OMNIPAQUE IOHEXOL 350 MG/ML SOLN COMPARISON:  March 16, 2022. FINDINGS: Lower chest: Minimal bibasilar subsegmental atelectasis is noted. Hepatobiliary: No focal liver abnormality is seen. No gallstones, gallbladder wall thickening, or biliary dilatation. Pancreas: Unremarkable. No pancreatic ductal dilatation or surrounding inflammatory changes. Spleen: Normal in size without focal abnormality. Adrenals/Urinary Tract: Adrenal glands appear normal. Bilateral renal atrophy is noted consistent with end-stage renal disease. Transplant is noted in right lower quadrant. No hydronephrosis or renal obstruction is noted. Urinary bladder is unremarkable. Stomach/Bowel: Stomach is within normal limits. Appendix appears normal. No evidence of bowel wall thickening, distention, or inflammatory changes. Vascular/Lymphatic: Aortic atherosclerosis. No enlarged abdominal or pelvic lymph nodes. Reproductive: Status post prostatectomy. Other: No abdominal wall hernia or abnormality. No abdominopelvic ascites. Musculoskeletal: No acute or significant osseous findings. IMPRESSION: No acute abnormality seen in the abdomen or pelvis. Aortic Atherosclerosis (ICD10-I70.0). Electronically Signed   By: Lupita Raider M.D.   On: 11/14/2022 16:09    EKG:  Independently reviewed.   Assessment and Plan: * Sepsis secondary to UTI (HCC) Pt with suspected pyelonephritis of transplanted kidney. Rejection reaction is felt less likely after discussion with Dr. Malen Gauze of nephrology.  She doesn't feel he needs transfer to [facility] or anything. Sepsis pathway Rocephin Mild AKI (Creat 1.59 up from 1.24 baseline in Jan) Strict intake and output Daily BMP Culture pending Tylenol PRN fever IVF Lactates are down trending Dont like fact he is slow to wake up / lethargic at this point with recurrent fever though: IV tylenol Will give single dose of stress dose steroids now  Acute encephalopathy On my evaluation: pt seems somewhat lethargic.  Opens eyes to voice, before going back to sleep rather quickly. Suspect this sedation is secondary to 8mg  morphine + Ativan (for MRI) he has  gotten in ED thus far. DDx includes delirium secondary to sepsis. Doesn't seem to have any focal neuro deficits on my limited exam to make me think stroke or other acute neurologic emergency at this point.  Renal transplant recipient As above: Rejection reaction is felt less likely after discussion with Dr. Malen Gauze of nephrology. She doesn't feel he needs transfer to [facility] or anything.  Mild AKI (Creat 1.59 up from 1.24 baseline in Jan) Strict intake and output Daily BMP Cont Myfortic, Prograf Will triple up on prednisone dose for the moment (sick dose), in setting of UTI / sepsis. Giving single dose of stress dose steroids for the moment Hold bactrim per Dr. Malen Gauze Nephrology team will follow in AM.   Type 2 diabetes mellitus without complication, without long-term current use of insulin (HCC) Doesn't look like he's on any meds for DM at this time Will just check CBGs AC/HS for the moment.  Essential hypertension Hold home BP meds for the moment given initial soft BPs on presentation to ED.      Advance Care Planning:   Code Status: Full Code  Consults: Dr.  Malen Gauze  Family Communication: Wife at bedside  Severity of Illness: The appropriate patient status for this patient is OBSERVATION. Observation status is judged to be reasonable and necessary in order to provide the required intensity of service to ensure the patient's safety. The patient's presenting symptoms, physical exam findings, and initial radiographic and laboratory data in the context of their medical condition is felt to place them at decreased risk for further clinical deterioration. Furthermore, it is anticipated that the patient will be medically stable for discharge from the hospital within 2 midnights of admission.   Author: Hillary Bow., DO 11/14/2022 10:51 PM  For on call review [email or web address].

## 2022-11-14 NOTE — ED Notes (Signed)
Admitting provider at bedside at this time.  Pt more lethargic than prior as well.

## 2022-11-14 NOTE — Assessment & Plan Note (Addendum)
Pt with suspected pyelonephritis of transplanted kidney. Rejection reaction is felt less likely after discussion with Dr. Malen Gauze of nephrology.  She doesn't feel he needs transfer to [facility] or anything. Sepsis pathway Rocephin Mild AKI (Creat 1.59 up from 1.24 baseline in Jan) Strict intake and output Daily BMP Culture pending Tylenol PRN fever IVF Lactates are down trending Dont like fact he is slow to wake up / lethargic at this point with recurrent fever though: IV tylenol Will give single dose of stress dose steroids now

## 2022-11-14 NOTE — ED Notes (Signed)
Per admitting provider, will hold off on giving PO medications until pt wakes up more to safely take.

## 2022-11-14 NOTE — Sepsis Progress Note (Signed)
[facility]link will follow per sepsis protocol  

## 2022-11-14 NOTE — Assessment & Plan Note (Signed)
Doesn't look like he's on any meds for DM at this time Will just check CBGs AC/HS for the moment.

## 2022-11-14 NOTE — Assessment & Plan Note (Signed)
Hold home BP meds for the moment given initial soft BPs on presentation to ED.

## 2022-11-14 NOTE — ED Provider Notes (Signed)
Time EMERGENCY DEPARTMENT AT [facility] Provider Note   CSN: [id number] Arrival date & time: 11/14/22  1305     History {Add pertinent medical, surgical, social history, OB history to HPI:1} Chief Complaint  Patient presents with   Flank Pain    [name] is a 62 y.o. male.  62 year old male with history of deceased donor renal transplant 2 years ago, HTN, HLD, and DM who presents emergency department with right-sided flank pain.  Patient states for the past 3 weeks has been having right-sided flank pain.  Says it is worsened with movement.  Says initially it would come and go but in the past 24 hours to start to become constant.  Will occasionally radiate down his right leg.  Denies any leg weakness or numbness. No bowel or bladder incontinence.  Does take tacrolimus, Myfortic, and prednisone for his renal transplant.  Also has been compliant with his Bactrim for infection prophylaxis.       Home Medications Prior to Admission medications   Medication Sig Start Date End Date Taking? Authorizing Provider  acetaminophen (TYLENOL) 500 MG tablet Take 1,000 mg by mouth every 6 (six) hours as needed for moderate pain.    [provider]  amLODipine (NORVASC) 2.5 MG tablet Take 2.5 mg by mouth at bedtime. 10/18/20   [provider]  amLODipine (NORVASC) 5 MG tablet Take 5 mg by mouth at bedtime. 11/17/20   [provider]  augmented betamethasone dipropionate (DIPROLENE-AF) 0.05 % cream Apply 1 application topically 2 (two) times daily. 08/04/20   [provider]  AURYXIA 1 GM 210 MG(Fe) tablet Take 840 mg by mouth 3 (three) times daily with meals. 03/03/19   [provider]  benzonatate (TESSALON) 100 MG capsule Take 2 capsules (200 mg total) by mouth every 8 (eight) hours. 06/12/22   Smitty Knudsen, PA-C  cinacalcet (SENSIPAR) 30 MG tablet Take 30 mg by mouth daily. 11/14/19   [provider]  gabapentin (NEURONTIN) 300  MG capsule Take 300 mg by mouth 3 (three) times a week. 10/18/20   [provider]  hydrocortisone (ANUSOL-HC) 2.5 % rectal cream Place rectally 2 (two) times daily. 06/05/20   Pokhrel, Rebekah Chesterfield, MD  hydrOXYzine (ATARAX/VISTARIL) 25 MG tablet Take 1 tablet (25 mg total) by mouth every 6 (six) hours as needed for anxiety, itching or nausea. 06/05/20   Pokhrel, Rebekah Chesterfield, MD  LORazepam (ATIVAN) 1 MG tablet Take 1 mg by mouth every 8 (eight) hours as needed for anxiety.    [provider]  meclizine (ANTIVERT) 12.5 MG tablet Take 1 tablet (12.5 mg total) by mouth 3 (three) times daily as needed for dizziness. 12/02/20   Petrucelli, Samantha R, PA-C  melatonin 5 MG TABS Take 1 tablet (5 mg total) by mouth at bedtime as needed (sleep). 06/05/20   Pokhrel, Rebekah Chesterfield, MD  multivitamin (RENA-VIT) TABS tablet Take 1 tablet by mouth daily.    [provider]  pantoprazole (PROTONIX) 40 MG tablet Take 1 tablet (40 mg total) by mouth daily. 06/05/20 06/05/21  Pokhrel, Rebekah Chesterfield, MD  triamcinolone (KENALOG) 0.1 % Apply 1 application topically 2 (two) times daily. 03/15/20   [provider]  VELPHORO 500 MG chewable tablet Chew 1,000 mg by mouth 3 (three) times daily. 08/17/20   [provider]  diltiazem (CARDIZEM CD) 180 MG 24 hr capsule Take 1 capsule (180 mg total) by mouth daily. Patient not taking: No sig reported 04/24/19 01/02/21  Terrilee Files, MD  metoprolol tartrate (LOPRESSOR) 25 MG tablet Take 1 tablet (25 mg total) by mouth 2 (two) times daily. Patient not taking: Reported on 06/04/2020 03/26/19 01/02/21  Ward, Layla Maw, DO      Allergies    Penicillins    Review of Systems   Review of Systems  Physical Exam Updated Vital Signs BP 100/70   Pulse (!) 117   Temp (!) 103.2 F (39.6 C) (Oral)   Resp (!) 34   SpO2 95%  Physical Exam Vitals and nursing note reviewed.  Constitutional:      General: He is not in acute distress.    Appearance: He is  well-developed.  HENT:     Head: Normocephalic and atraumatic.     Right Ear: External ear normal.     Left Ear: External ear normal.     Nose: Nose normal.  Eyes:     Extraocular Movements: Extraocular movements intact.     Conjunctiva/sclera: Conjunctivae normal.     Pupils: Pupils are equal, round, and reactive to light.  Cardiovascular:     Rate and Rhythm: Regular rhythm. Tachycardia present.     Heart sounds: Normal heart sounds.  Pulmonary:     Effort: Pulmonary effort is normal. No respiratory distress.     Breath sounds: Normal breath sounds.  Abdominal:     General: There is no distension.     Palpations: Abdomen is soft. There is no mass.     Tenderness: There is no abdominal tenderness. There is no right CVA tenderness, left CVA tenderness or guarding.     Comments: Scars from renal transplant right lower quadrant  Musculoskeletal:     Cervical back: Normal range of motion and neck supple.     Right lower leg: No edema.     Left lower leg: No edema.     Comments: Right-sided paraspinal tenderness to palpation at approximately L3-L4 region  Skin:    General: Skin is warm and dry.  Neurological:     Mental Status: He is alert. Mental status is at baseline.     Comments: No spinal midline TTP in cervical, thoracic spine.  L3-L4 midline tenderness to palpation. No stepoffs noted.   Motor: Muscle bulk and tone are normal. Strength is 5/5 in hip flexion, knee flexion and extension, ankle dorsiflexion and plantar flexion bilaterally. Full strength of great toe dorsiflexion bilaterally.  Sensory: Intact sensation to light touch in L2 though S1 dermatomes bilaterally.   Psychiatric:        Mood and Affect: Mood normal.        Behavior: Behavior normal.     ED Results / Procedures / Treatments   Labs (all labs ordered are listed, but only abnormal results are displayed) Labs Reviewed  COMPREHENSIVE METABOLIC PANEL - Abnormal; Notable for the following components:       Result Value   CO2 21 (*)    Glucose, Bld 126 (*)    Creatinine, Ser 1.59 (*)    Total Bilirubin 1.8 (*)    GFR, Estimated 48 (*)    All other components within normal limits  CBC WITH DIFFERENTIAL/PLATELET - Abnormal; Notable for the following components:   WBC 11.1 (*)    Neutro Abs 9.6 (*)    All other components within normal limits  SARS CORONAVIRUS 2 BY RT PCR  CULTURE, BLOOD (ROUTINE X 2)  CULTURE, BLOOD (ROUTINE X 2)  LACTIC ACID, PLASMA  LACTIC ACID, PLASMA  URINALYSIS, ROUTINE W REFLEX MICROSCOPIC  EKG None  Radiology No results found.  Procedures Procedures  {Document cardiac monitor, telemetry assessment procedure when appropriate:1}  Medications Ordered in ED Medications  cefTRIAXone (ROCEPHIN) 1 g in sodium chloride 0.9 % 100 mL IVPB (has no administration in time range)  ondansetron (ZOFRAN-ODT) disintegrating tablet 4 mg (4 mg Oral Given 11/14/22 1336)  acetaminophen (TYLENOL) tablet 1,000 mg (1,000 mg Oral Given 11/14/22 1336)    ED Course/ Medical Decision Making/ A&P   {   Click here for ABCD2, HEART and other calculatorsREFRESH Note before signing :1}                          Medical Decision Making Amount and/or Complexity of Data Reviewed Labs: ordered. Radiology: ordered.  Risk OTC drugs. Prescription drug management.   ***  {Document critical care time when appropriate:1} {Document review of labs and clinical decision tools ie heart score, Chads2Vasc2 etc:1}  {Document your independent review of radiology images, and any outside records:1} {Document your discussion with family members, caretakers, and with consultants:1} {Document social determinants of health affecting pt's care:1} {Document your decision making why or why not admission, treatments were needed:1} Final Clinical Impression(s) / ED Diagnoses Final diagnoses:  None    Rx / DC Orders ED Discharge Orders     None

## 2022-11-14 NOTE — ED Triage Notes (Signed)
Pt having bilateral flank pain x 3 weeks. Now having chills, n/v, and generalized weakness.

## 2022-11-14 NOTE — Assessment & Plan Note (Addendum)
As above: Rejection reaction is felt less likely after discussion with Dr. Malen Gauze of nephrology. She doesn't feel he needs transfer to [facility] or anything.  Mild AKI (Creat 1.59 up from 1.24 baseline in Jan) Strict intake and output Daily BMP Cont Myfortic, Prograf Will triple up on prednisone dose for the moment (sick dose), in setting of UTI / sepsis. Giving single dose of stress dose steroids for the moment Hold bactrim per Dr. Malen Gauze Nephrology team will follow in AM.

## 2022-11-14 NOTE — ED Notes (Signed)
Pt received for care at 1900.  At this time, pt off unit in MRI.  This RN to introduce self to pt upon return.

## 2022-11-15 DIAGNOSIS — B9620 Unspecified Escherichia coli [E. coli] as the cause of diseases classified elsewhere: Secondary | ICD-10-CM | POA: Diagnosis present

## 2022-11-15 DIAGNOSIS — Y830 Surgical operation with transplant of whole organ as the cause of abnormal reaction of the patient, or of later complication, without mention of misadventure at the time of the procedure: Secondary | ICD-10-CM | POA: Diagnosis present

## 2022-11-15 DIAGNOSIS — T8613 Kidney transplant infection: Secondary | ICD-10-CM | POA: Diagnosis present

## 2022-11-15 DIAGNOSIS — M479 Spondylosis, unspecified: Secondary | ICD-10-CM | POA: Diagnosis present

## 2022-11-15 DIAGNOSIS — G4733 Obstructive sleep apnea (adult) (pediatric): Secondary | ICD-10-CM | POA: Diagnosis present

## 2022-11-15 DIAGNOSIS — G9341 Metabolic encephalopathy: Secondary | ICD-10-CM | POA: Diagnosis present

## 2022-11-15 DIAGNOSIS — Z940 Kidney transplant status: Secondary | ICD-10-CM | POA: Diagnosis not present

## 2022-11-15 DIAGNOSIS — E876 Hypokalemia: Secondary | ICD-10-CM | POA: Diagnosis present

## 2022-11-15 DIAGNOSIS — R7989 Other specified abnormal findings of blood chemistry: Secondary | ICD-10-CM | POA: Diagnosis present

## 2022-11-15 DIAGNOSIS — N182 Chronic kidney disease, stage 2 (mild): Secondary | ICD-10-CM | POA: Diagnosis present

## 2022-11-15 DIAGNOSIS — M48061 Spinal stenosis, lumbar region without neurogenic claudication: Secondary | ICD-10-CM | POA: Diagnosis present

## 2022-11-15 DIAGNOSIS — Z6835 Body mass index (BMI) 35.0-35.9, adult: Secondary | ICD-10-CM | POA: Diagnosis not present

## 2022-11-15 DIAGNOSIS — E1122 Type 2 diabetes mellitus with diabetic chronic kidney disease: Secondary | ICD-10-CM | POA: Diagnosis present

## 2022-11-15 DIAGNOSIS — A419 Sepsis, unspecified organism: Secondary | ICD-10-CM | POA: Diagnosis present

## 2022-11-15 DIAGNOSIS — I10 Essential (primary) hypertension: Secondary | ICD-10-CM | POA: Diagnosis not present

## 2022-11-15 DIAGNOSIS — K219 Gastro-esophageal reflux disease without esophagitis: Secondary | ICD-10-CM | POA: Diagnosis present

## 2022-11-15 DIAGNOSIS — E669 Obesity, unspecified: Secondary | ICD-10-CM | POA: Diagnosis present

## 2022-11-15 DIAGNOSIS — I129 Hypertensive chronic kidney disease with stage 1 through stage 4 chronic kidney disease, or unspecified chronic kidney disease: Secondary | ICD-10-CM | POA: Diagnosis present

## 2022-11-15 DIAGNOSIS — R109 Unspecified abdominal pain: Secondary | ICD-10-CM | POA: Diagnosis present

## 2022-11-15 DIAGNOSIS — T8619 Other complication of kidney transplant: Secondary | ICD-10-CM | POA: Diagnosis present

## 2022-11-15 DIAGNOSIS — I2510 Atherosclerotic heart disease of native coronary artery without angina pectoris: Secondary | ICD-10-CM | POA: Diagnosis present

## 2022-11-15 DIAGNOSIS — E785 Hyperlipidemia, unspecified: Secondary | ICD-10-CM | POA: Diagnosis present

## 2022-11-15 DIAGNOSIS — M109 Gout, unspecified: Secondary | ICD-10-CM | POA: Diagnosis present

## 2022-11-15 DIAGNOSIS — G9340 Encephalopathy, unspecified: Secondary | ICD-10-CM | POA: Diagnosis not present

## 2022-11-15 DIAGNOSIS — Z1152 Encounter for screening for COVID-19: Secondary | ICD-10-CM | POA: Diagnosis not present

## 2022-11-15 DIAGNOSIS — Z9079 Acquired absence of other genital organ(s): Secondary | ICD-10-CM | POA: Diagnosis not present

## 2022-11-15 DIAGNOSIS — N179 Acute kidney failure, unspecified: Secondary | ICD-10-CM | POA: Diagnosis present

## 2022-11-15 DIAGNOSIS — R6520 Severe sepsis without septic shock: Secondary | ICD-10-CM

## 2022-11-15 DIAGNOSIS — N12 Tubulo-interstitial nephritis, not specified as acute or chronic: Secondary | ICD-10-CM | POA: Diagnosis present

## 2022-11-15 LAB — GLUCOSE, CAPILLARY
Glucose-Capillary: 125 mg/dL — ABNORMAL HIGH (ref 70–99)
Glucose-Capillary: 138 mg/dL — ABNORMAL HIGH (ref 70–99)
Glucose-Capillary: 155 mg/dL — ABNORMAL HIGH (ref 70–99)
Glucose-Capillary: 203 mg/dL — ABNORMAL HIGH (ref 70–99)

## 2022-11-15 LAB — BASIC METABOLIC PANEL
Anion gap: 13 (ref 5–15)
BUN: 14 mg/dL (ref 8–23)
CO2: 18 mmol/L — ABNORMAL LOW (ref 22–32)
Calcium: 8.9 mg/dL (ref 8.9–10.3)
Chloride: 105 mmol/L (ref 98–111)
Creatinine, Ser: 1.82 mg/dL — ABNORMAL HIGH (ref 0.61–1.24)
GFR, Estimated: 41 mL/min — ABNORMAL LOW (ref >60)
Glucose, Bld: 146 mg/dL — ABNORMAL HIGH (ref 70–99)
Potassium: 3.3 mmol/L — ABNORMAL LOW (ref 3.5–5.1)
Sodium: 136 mmol/L (ref 135–145)

## 2022-11-15 LAB — CORTISOL-AM, BLOOD: Cortisol - AM: 54.7 ug/dL — ABNORMAL HIGH (ref 6.7–22.6)

## 2022-11-15 LAB — CBC
HCT: 44 % (ref 39.0–52.0)
Hemoglobin: 13.8 g/dL (ref 13.0–17.0)
MCH: 28.6 pg (ref 26.0–34.0)
MCHC: 31.4 g/dL (ref 30.0–36.0)
MCV: 91.1 fL (ref 80.0–100.0)
Platelets: 143 10*3/uL — ABNORMAL LOW (ref 150–400)
RBC: 4.83 MIL/uL (ref 4.22–5.81)
RDW: 14.9 % (ref 11.5–15.5)
WBC: 15.5 10*3/uL — ABNORMAL HIGH (ref 4.0–10.5)
nRBC: 0 % (ref 0.0–0.2)

## 2022-11-15 LAB — HIV ANTIBODY (ROUTINE TESTING W REFLEX): HIV Screen 4th Generation wRfx: NONREACTIVE

## 2022-11-15 LAB — CULTURE, BLOOD (ROUTINE X 2)

## 2022-11-15 LAB — PROTIME-INR
INR: 1.2 (ref 0.8–1.2)
Prothrombin Time: 15.5 seconds — ABNORMAL HIGH (ref 11.4–15.2)

## 2022-11-15 LAB — PROCALCITONIN: Procalcitonin: 14.62 ng/mL

## 2022-11-15 MED ORDER — METOPROLOL TARTRATE 25 MG PO TABS
25.0000 mg | ORAL_TABLET | Freq: Two times a day (BID) | ORAL | Status: DC
  Administered 2022-11-15 – 2022-11-17 (×5): 25 mg via ORAL
  Filled 2022-11-15 (×5): qty 1

## 2022-11-15 MED ORDER — POTASSIUM CHLORIDE CRYS ER 20 MEQ PO TBCR
40.0000 meq | EXTENDED_RELEASE_TABLET | Freq: Once | ORAL | Status: AC
  Administered 2022-11-15: 40 meq via ORAL

## 2022-11-15 MED ORDER — PREDNISONE 5 MG PO TABS
5.0000 mg | ORAL_TABLET | Freq: Every day | ORAL | Status: DC
  Administered 2022-11-16 – 2022-11-17 (×2): 5 mg via ORAL
  Filled 2022-11-15 (×2): qty 1

## 2022-11-15 MED ORDER — POTASSIUM CHLORIDE CRYS ER 20 MEQ PO TBCR
40.0000 meq | EXTENDED_RELEASE_TABLET | ORAL | Status: DC
  Filled 2022-11-15: qty 2

## 2022-11-15 MED ORDER — ATORVASTATIN CALCIUM 80 MG PO TABS
80.0000 mg | ORAL_TABLET | Freq: Every day | ORAL | Status: DC
  Administered 2022-11-15 – 2022-11-17 (×3): 80 mg via ORAL
  Filled 2022-11-15 (×3): qty 1

## 2022-11-15 NOTE — Progress Notes (Signed)
PROGRESS NOTE    [name]  ZOX:[id number] DOB: 05-28-1961 DOA: 11/14/2022 PCP: Burton Apley, MD   Brief Narrative:  HPI: [name] is a 62 y.o. 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   Pt presents to ED with 3 week h/o R sided flank pain.  Worse with movement.  Says initially it would come and go but in the past 24 hours to start to become constant. Will occasionally radiate down his right leg. Denies any leg weakness or numbness. No bowel or bladder incontinence.    Compliant with all anti-rejection meds and bactrim for infection PPx.   On my evaluation: pt seems somewhat lethargic.  Opens eyes to voice, before going back to sleep rather quickly.  Could just be from morphine.  Wife thinks he's just tired out from events thus far today.   Generalized weakness on trying to stand today, but nothing focal (not left or right sided).  Assessment & Plan:   Principal Problem:   Severe sepsis (HCC) Active Problems:   Renal transplant recipient   Acute encephalopathy   Essential hypertension   Type 2 diabetes mellitus without complication, without long-term current use of insulin (HCC)  Severe sepsis secondary to UTI/ ?  Right-sided pyelonephritis in a patient with a history of renal transplant, POA: Patient met criteria for severe sepsis based on fever, tachycardia, leukocytosis and lactic acid of 3.4.  UA consistent with UTI.  CT abdomen did not clearly show pyelonephritis or hydronephrosis however his presentation and exam findings of right CVA tenderness indicates possibly clinical pyelonephritis.  Case was discussed by admitting hospitalist with nephrology, they do not think patient has transplant rejection.  Patient was started on Rocephin which we will continue and follow culture and tailor antibiotics accordingly.  Procalcitonin 14.62.  Remains on tacrolimus and prednisone.   Nephrology consulted.  Acute toxic encephalopathy secondary to UTI and sepsis: Upon presentation, patient was encephalopathic and lethargic.  Unsure whether this was secondary to UTI or the medications such as morphine and Ativan that he was given in the ED. patient currently is slightly slow in response but he is fully oriented.  AKI on CKD stage IIIa: Baseline creatinine appears to be around 1.3.  Presented with 1.6 and now 1.8.  Continue IV hydration as it is and repeat labs in the morning.  Hypokalemia: Replace.  DVT prophylaxis: enoxaparin (LOVENOX) injection 40 mg Start: 11/15/22 1400   Code Status: Full Code  Family Communication: Fianc present at bedside.  Plan of care discussed with patient in length and he/she verbalized understanding and agreed with it.  Status is: Observation The patient will require care spanning > 2 midnights and should be moved to inpatient because: Worsening creatinine and is still symptomatic.   Estimated body mass index is 35.57 kg/m as calculated from the following:   Height as of 06/12/22: 5\' 11"  (1.803 m).   Weight as of 06/12/22: 115.7 kg.    Nutritional Assessment: There is no height or weight on file to calculate BMI.. Seen by dietician.  I agree with the assessment and plan as outlined below: Nutrition Status:        . Skin Assessment: I have examined the patient's skin and I agree with the wound assessment as performed by the wound care RN as outlined below:    Consultants:  Nephrology  Procedures:  None  Antimicrobials:  Anti-infectives (From admission,  onward)    Start     Dose/Rate Route Frequency Ordered Stop   11/15/22 1000  cefTRIAXone (ROCEPHIN) 2 g in sodium chloride 0.9 % 100 mL IVPB        2 g 200 mL/hr over 30 Minutes Intravenous Every 24 hours 11/14/22 2041     11/14/22 1615  vancomycin (VANCOREADY) IVPB 2000 mg/400 mL        2,000 mg 200 mL/hr over 120 Minutes Intravenous  Once 11/14/22 1601 11/14/22 2115    11/14/22 1545  ceFEPIme (MAXIPIME) 2 g in sodium chloride 0.9 % 100 mL IVPB        2 g 200 mL/hr over 30 Minutes Intravenous  Once 11/14/22 1543 11/14/22 1635   11/14/22 1545  metroNIDAZOLE (FLAGYL) IVPB 500 mg        500 mg 100 mL/hr over 60 Minutes Intravenous  Once 11/14/22 1543 11/14/22 1813   11/14/22 1545  vancomycin (VANCOCIN) IVPB 1000 mg/200 mL premix  Status:  Discontinued        1,000 mg 200 mL/hr over 60 Minutes Intravenous  Once 11/14/22 1543 11/14/22 1601   11/14/22 1500  cefTRIAXone (ROCEPHIN) 1 g in sodium chloride 0.9 % 100 mL IVPB  Status:  Discontinued        1 g 200 mL/hr over 30 Minutes Intravenous  Once 11/14/22 1457 11/14/22 1542         Subjective: Patient seen and examined.  He states that he feels better.  He denies any more flank pain today.  Although feels very weak but he is oriented now.  Objective: Vitals:   11/15/22 0030 11/15/22 0031 11/15/22 0045 11/15/22 0110  BP:  125/78  102/74  Pulse: (!) 115 (!) 116 (!) 113   Resp: (!) 31 (!) 32 (!) 29 20  Temp:    99.3 F (37.4 C)  TempSrc:    Oral  SpO2: 92% 93% 92% 98%    Intake/Output Summary (Last 24 hours) at 11/15/2022 0747 Last data filed at 11/15/2022 0100 Gross per 24 hour  Intake 340 ml  Output --  Net 340 ml   There were no vitals filed for this visit.  Examination:  General exam: Appears calm and comfortable  Respiratory system: Clear to auscultation. Respiratory effort normal. Cardiovascular system: S1 & S2 heard, RRR. No JVD, murmurs, rubs, gallops or clicks. No pedal edema. Gastrointestinal system: Abdomen is nondistended, soft and nontender. No organomegaly or masses felt. Normal bowel sounds heard.  Mild right CVA tenderness Central nervous system: Alert and oriented. No focal neurological deficits. Extremities: Symmetric 5 x 5 power. Skin: No rashes, lesions or ulcers Psychiatry: Judgement and insight appear normal. Mood & affect appropriate.    Data Reviewed: I have  personally reviewed following labs and imaging studies  CBC: Recent Labs  Lab 11/14/22 1339 11/15/22 0149  WBC 11.1* 15.5*  NEUTROABS 9.6*  --   HGB 15.4 13.8  HCT 50.4 44.0  MCV 92.8 91.1  PLT 151 143*   Basic Metabolic Panel: Recent Labs  Lab 11/14/22 1339 11/15/22 0149  NA 137 136  K 4.1 3.3*  CL 107 105  CO2 21* 18*  GLUCOSE 126* 146*  BUN 10 14  CREATININE 1.59* 1.82*  CALCIUM 9.3 8.9   GFR: CrCl cannot be calculated (Unknown ideal weight.). Liver Function Tests: Recent Labs  Lab 11/14/22 1339  AST 21  ALT 17  ALKPHOS 95  BILITOT 1.8*  PROT 6.9  ALBUMIN 4.0   No results for input(s): "  LIPASE", "AMYLASE" in the last 168 hours. No results for input(s): "AMMONIA" in the last 168 hours. Coagulation Profile: Recent Labs  Lab 11/15/22 0149  INR 1.2   Cardiac Enzymes: No results for input(s): "CKTOTAL", "CKMB", "CKMBINDEX", "TROPONINI" in the last 168 hours. BNP (last 3 results) No results for input(s): "PROBNP" in the last 8760 hours. HbA1C: No results for input(s): "HGBA1C" in the last 72 hours. CBG: No results for input(s): "GLUCAP" in the last 168 hours. Lipid Profile: No results for input(s): "CHOL", "HDL", "LDLCALC", "TRIG", "CHOLHDL", "LDLDIRECT" in the last 72 hours. Thyroid Function Tests: No results for input(s): "TSH", "T4TOTAL", "FREET4", "T3FREE", "THYROIDAB" in the last 72 hours. Anemia Panel: No results for input(s): "VITAMINB12", "FOLATE", "FERRITIN", "TIBC", "IRON", "RETICCTPCT" in the last 72 hours. Sepsis Labs: Recent Labs  Lab 11/14/22 1339 11/14/22 1630 11/15/22 0149  PROCALCITON  --   --  14.62  LATICACIDVEN 3.2* 1.9  --     Recent Results (from the past 240 hour(s))  SARS Coronavirus 2 by RT PCR (hospital order, performed in [facility] hospital lab) *cepheid single result test* Anterior Nasal Swab     Status: None   Collection Time: 11/14/22  1:31 PM   Specimen: Anterior Nasal Swab  Result Value Ref Range Status    SARS Coronavirus 2 by RT PCR NEGATIVE NEGATIVE Final    Comment: Performed at [facility], [address], [place], [place] [address]     Radiology Studies: MR Lumbar Spine W Wo Contrast  Result Date: 11/14/2022 CLINICAL DATA:  Back pain radiating down right lower extremity, immunocompromised, fever EXAM: MRI LUMBAR SPINE WITHOUT AND WITH CONTRAST TECHNIQUE: Multiplanar and multiecho pulse sequences of the lumbar spine were obtained without and with intravenous contrast. CONTRAST:  10mL GADAVIST GADOBUTROL 1 MMOL/ML IV SOLN COMPARISON:  12/25/2017 MRI lumbar spine and 04/22/2022 CT lumbar spine FINDINGS: Segmentation:  5 lumbar type vertebral bodies. Alignment:  No significant listhesis.  Levocurvature. Vertebrae: No acute fracture or evidence of discitis. Redemonstrated T2 hyperintense lesion in the inferior aspect of L1, which appears similar to the prior exam and is favored to represent an atypical hemangioma. Progressive Schmorl's node formation at the endplates at L2-L3, L3-L4, and L4-L5, with additional endplate degenerative changes at L4-L5. No suspicious osseous lesion. No abnormal osseous enhancement. Conus medullaris and cauda equina: Conus extends to the L1-L2 level. Conus and cauda equina appear normal. No abnormal enhancement. Paraspinal and other soft tissues: Atrophic native kidneys. Right lower quadrant transplant kidney no lymphadenopathy. Disc levels: T12-L1: No significant disc bulge. Mild facet arthropathy. No spinal canal stenosis or neural foraminal narrowing. L1-L2: No significant disc bulge. No spinal canal stenosis or neural foraminal narrowing. L2-L3: Mild to moderate disc bulge with right foraminal/extreme lateral disc protrusion. Mild facet arthropathy. Ligamentum flavum hypertrophy. Narrowing of the lateral recesses. Mild-to-moderate spinal canal stenosis, which has progressed slightly from the prior exam. No neural foraminal narrowing. Posterior to the L3 vertebral body,  there is mild to moderate thecal sac narrowing secondary to epidural lipomatosis (series 8, image 20). L3-L4: Mild disc bulge with right foraminal/extreme lateral disc protrusion, which likely contacts the exiting right L3 nerve. Mild facet arthropathy. Ligamentum flavum hypertrophy. Mild spinal canal stenosis and mild right neural foraminal narrowing, unchanged. Posterior to the L4 vertebral body, there is moderate thecal sac narrowing, in part secondary to epidural lipomatosis (series 8, image 26). L4-L5: Mild disc bulge, somewhat eccentric to the left. Mild-to-moderate facet arthropathy. Narrowing of the lateral recesses. Mild thecal sac  narrowing, in part secondary to epidural lipomatosis, new from prior exam. Mild-to-moderate left neural foraminal narrowing, unchanged. Mild thecal sac narrowing is also noted posterior to the L5 vertebral body, secondary to epidural lipomatosis. L5-S1: Mild disc bulge, which may contact the exiting right greater than left L5 nerves. Superimposed central disc protrusion. Mild facet arthropathy. No spinal canal stenosis or neural foraminal narrowing. IMPRESSION: 1. No evidence of discitis or osteomyelitis. No epidural abscess. 2. L2-L3 mild-to-moderate spinal canal stenosis, which has progressed slightly from the prior exam. Narrowing of the lateral recesses could affect the descending L3 nerve roots. 3. L3-L4 mild spinal canal stenosis and mild right neural foraminal narrowing, unchanged. 4. L4-L5 mild-to-moderate left neural foraminal narrowing, unchanged. Narrowing of the lateral recesses at this level could affect the descending L5 nerve roots 5. L5-S1 disc bulge may contact the exiting right greater than left L5 nerves. 6. Epidural lipomatosis causes thecal sac narrowing posterior to the L3, L4, and L5 vertebral bodies, which is moderate at L4 and mild at L3 and L5. 7. Progressive Schmorl's node formation at L2-L3, L3-L4, and L4-L5, which can be a cause back pain.  Electronically Signed   By: Wiliam Ke M.D.   On: 11/14/2022 20:11   CT ABDOMEN PELVIS W CONTRAST  Result Date: 11/14/2022 CLINICAL DATA:  Right flank pain. EXAM: CT ABDOMEN AND PELVIS WITH CONTRAST TECHNIQUE: Multidetector CT imaging of the abdomen and pelvis was performed using the standard protocol following bolus administration of intravenous contrast. RADIATION DOSE REDUCTION: This exam was performed according to the departmental dose-optimization program which includes automated exposure control, adjustment of the mA and/or kV according to patient size and/or use of iterative reconstruction technique. CONTRAST:  60mL OMNIPAQUE IOHEXOL 350 MG/ML SOLN COMPARISON:  March 16, 2022. FINDINGS: Lower chest: Minimal bibasilar subsegmental atelectasis is noted. Hepatobiliary: No focal liver abnormality is seen. No gallstones, gallbladder wall thickening, or biliary dilatation. Pancreas: Unremarkable. No pancreatic ductal dilatation or surrounding inflammatory changes. Spleen: Normal in size without focal abnormality. Adrenals/Urinary Tract: Adrenal glands appear normal. Bilateral renal atrophy is noted consistent with end-stage renal disease. Transplant is noted in right lower quadrant. No hydronephrosis or renal obstruction is noted. Urinary bladder is unremarkable. Stomach/Bowel: Stomach is within normal limits. Appendix appears normal. No evidence of bowel wall thickening, distention, or inflammatory changes. Vascular/Lymphatic: Aortic atherosclerosis. No enlarged abdominal or pelvic lymph nodes. Reproductive: Status post prostatectomy. Other: No abdominal wall hernia or abnormality. No abdominopelvic ascites. Musculoskeletal: No acute or significant osseous findings. IMPRESSION: No acute abnormality seen in the abdomen or pelvis. Aortic Atherosclerosis (ICD10-I70.0). Electronically Signed   By: Lupita Raider M.D.   On: 11/14/2022 16:09    Scheduled Meds:  enoxaparin (LOVENOX) injection  40 mg  Subcutaneous Q24H   lidocaine  1 patch Transdermal Q24H   mycophenolate  360 mg Oral BID   predniSONE  15 mg Oral Q breakfast   tacrolimus  0.5 mg Oral q AM   tacrolimus  1 mg Oral BID   Continuous Infusions:  cefTRIAXone (ROCEPHIN)  IV     lactated ringers 150 mL/hr (11/15/22 0650)     LOS: 0 days   Hughie Closs, MD [facility]  11/15/2022, 7:47 AM   *Please note that this is a verbal dictation therefore any spelling or grammatical errors are due to the "Dragon Medical One" system interpretation.  Please page via Amion and do not message via secure chat for urgent patient care matters. Secure chat can be used for non urgent patient  care matters.  How to contact the [facility] Attending or Consulting provider 7A - 7P or covering provider during after hours 7P -7A, for this patient?  Check the care team in [facility] and look for a) attending/consulting [facility] provider listed and b) the [facility] team listed. Page or secure chat 7A-7P. Log into [email or web address] and use [place]'s universal password to access. If you do not have the password, please contact the hospital operator. Locate the [facility] provider you are looking for under [facility] and page to a number that you can be directly reached. If you still have difficulty reaching the provider, please page the [facility] (Director on Call) for the Hospitalists listed on amion for assistance.

## 2022-11-15 NOTE — Consult Note (Signed)
[facility] Renal Consultation Note  Requesting MD: Pahwani Indication for Consultation: transplant/ UTI/ some A on CRF  HPI:  [name] is a 62 y.o. male with past medical history significant for ESRD that was possibly felt to be secondary to hypertension, obesity, OSA, hyperlipidemia.  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  He is followed there regularly last seen in January 2024.  [facility] also has [facility] Thedore Mins, seen in February.  Initial posttransplant course was slightly complicated but had been doing pretty well lately.  Creatinine 1.24 in January 2024.  Patient presented to the emergency department on 5/28 with bilateral flank pain, generalized weakness as well as chills.  He was found to have a creatinine of 1.59.  Urinalysis consistent with urinary tract infection and he had a white blood cell count of 15,000.  Interestingly, patient was not having any dysuria or other urinary symptoms.  He has had a prostatectomy in the past so does not have urinary retention, even has occasional incontinence.  Imaging did not reveal any stranding or hydronephrosis of transplanted kidney.  He has been started on IV antibiotics.  Urine output is adequate.  Creatinine is 1.8 this morning-up from 1.6.  Of note, he did receive contrast with a CT on 5/28.  He otherwise feels well.  He is hoping he will need to stay in the hospital along.    Creatinine, Ser  Date/Time Value Ref Range Status  11/15/2022 01:49 AM 1.82 (H) 0.61 - 1.24 mg/dL Final  95/28/4132 44:01 PM 1.59 (H) 0.61 - 1.24 mg/dL Final  02/72/5366 44:03 PM 1.35 (H) 0.61 - 1.24 mg/dL Final  47/42/5956 38:75 AM 11.16 (H) 0.61 - 1.24 mg/dL Final  64/33/2951 88:41 PM 13.51 (H) 0.61 - 1.24 mg/dL Final  66/11/3014 01:09 AM 10.11 (H) 0.61 - 1.24 mg/dL Final  32/35/5732 20:25 AM 8.86 (H) 0.61 - 1.24 mg/dL Final  42/70/6237 62:83 AM 9.35 (H)  0.61 - 1.24 mg/dL Final  15/17/6160 73:71 AM 18.67 (H) 0.61 - 1.24 mg/dL Final  12/13/9483 46:27 PM 17.54 (H) 0.61 - 1.24 mg/dL Final  03/50/0938 18:29 PM 9.48 (H) 0.61 - 1.24 mg/dL Final  93/71/6967 89:38 PM 6.50 (H) 0.61 - 1.24 mg/dL Final  04/04/5101 58:52 PM 11.43 (H) 0.61 - 1.24 mg/dL Final  77/82/4235 36:14 PM 8.26 (H) 0.61 - 1.24 mg/dL Final  43/15/4008 67:61 AM 9.66 (H) 0.61 - 1.24 mg/dL Final  95/02/3266 12:45 PM 9.22 (H) 0.61 - 1.24 mg/dL Final  80/99/8338 25:05 AM 5.63 (H) 0.61 - 1.24 mg/dL Final  39/76/7341 93:79 AM 10.91 (H) 0.61 - 1.24 mg/dL Final  02/40/9735 32:99 AM 6.57 (H) 0.61 - 1.24 mg/dL Final  24/26/8341 96:22 PM 11.81 (H) 0.61 - 1.24 mg/dL Final  29/79/8921 19:41 AM 10.44 (H) 0.61 - 1.24 mg/dL Final  74/01/1447 18:56 AM 13.51 (H) 0.61 - 1.24 mg/dL Final  31/49/7026 37:85 AM 10.82 (H) 0.61 - 1.24 mg/dL Final  88/50/2774 12:87 PM 13.71 (H) 0.61 - 1.24 mg/dL Final  86/76/7209 47:09 AM 13.52 (H) 0.61 - 1.24 mg/dL Final  62/83/6629 47:65 AM 12.18 (H) 0.61 - 1.24 mg/dL Final  46/50/3546 56:81 AM 15.05 (H) 0.61 - 1.24 mg/dL Final  27/51/7001 74:94 AM 14.51 (H) 0.61 - 1.24 mg/dL Final  49/67/5916 38:46 AM 13.19 (H) 0.61 - 1.24 mg/dL Final  65/99/3570 17:79 AM 16.47 (H) 0.61 - 1.24 mg/dL Final  39/08/90 33:00 AM 16.90 (H)  0.61 - 1.24 mg/dL Final  16/03/9603 54:09 AM 11.63 (H) 0.61 - 1.24 mg/dL Final  81/19/1478 29:56 PM 3.97 (H) 0.61 - 1.24 mg/dL Final  21/30/8657 84:69 AM 3.30 (H) 0.50 - 1.35 mg/dL Final  62/95/2841 32:44 AM 3.64 (H) 0.50 - 1.35 mg/dL Final  06/21/7251 66:44 AM 4.18 (H) 0.50 - 1.35 mg/dL Final  03/47/4259 56:38 PM 4.71 (H) 0.50 - 1.35 mg/dL Final  75/64/3329 51:88 AM 3.48 (H) 0.50 - 1.35 mg/dL Final  41/66/0630 16:01 PM 2.16 (H) 0.50 - 1.35 mg/dL Final  09/32/3557 32:20 AM 2.52 (H) 0.50 - 1.35 mg/dL Final  25/42/7062 37:62 PM 2.20 (H) 0.50 - 1.35 mg/dL Final  83/15/1761 60:73 AM 2.19 (H) 0.50 - 1.35 mg/dL Final  71/11/2692 85:46 AM 1.98 (H) 0.50 -  1.35 mg/dL Final  27/08/5007 38:18 AM 2.03 (H) 0.50 - 1.35 mg/dL Final  29/93/7169 67:89 PM 2.10 (H) 0.50 - 1.35 mg/dL Final  38/03/1750 02:58 PM 1.70 (H) 0.50 - 1.35 mg/dL Final  52/77/8242 35:36 AM 1.49 (H) 0.50 - 1.35 mg/dL Final  14/43/1540 08:67 AM 1.48 (H) 0.50 - 1.35 mg/dL Final  61/95/0932 67:12 AM 1.63 (H) 0.50 - 1.35 mg/dL Final  45/80/9983 38:25 PM 1.77 (H) 0.50 - 1.35 mg/dL Final  05/39/7673 41:93 PM 1.65 (H) 0.50 - 1.35 mg/dL Final  79/07/4095 35:32 AM 1.68 (H) 0.50 - 1.35 mg/dL Final     PMHx:   Past Medical History:  Diagnosis Date   Anemia 10/01/2015   Arthritis    SPINE   Borderline diabetes mellitus    Diabetes mellitus without complication (HCC)    borderline on no meds    Dialysis patient (HCC)    Elevated PSA    ESRD on hemodialysis (HCC) 06/04/2020   GERD without esophagitis 01/02/2021   Gout    STABLE  PER PT 10-22-2013   H/O hiatal hernia    Hyperlipidemia    Hypertension    Microhematuria    Nocturia    OSA (obstructive sleep apnea)    PER PT STUDY DONE 2005 (APPROX).  NON- COMPLIANT CPAP   SVT (supraventricular tachycardia)    Wears glasses     Past Surgical History:  Procedure Laterality Date   AV FISTULA PLACEMENT Left 08/17/2015   Procedure: BRACHIOCEPHALIC ARTERIOVENOUS (AV) FISTULA CREATION  left arm;  Surgeon: Fransisco Hertz, MD;  Location: [facility] OR;  Service: Vascular;  Laterality: Left;   CARDIAC CATHETERIZATION  10-04-2006   DR Alanda Amass   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%/  2ndDIAGONAL 30%/  RCA 40%/  EF 60%   CARDIAC CATHETERIZATION  04-07-2002  DR [facility]   NON-CRITICAL CAD/  PRESERVED LV   CYSTOSCOPY W/ RETROGRADES Bilateral 10/27/2013   Procedure: CYSTOSCOPY WITH RETROGRADE PYELOGRAM;  Surgeon: Milford Cage, MD;  Location: [facility];  Service: Urology;  Laterality: Bilateral;   LYMPHADENECTOMY Bilateral 12/18/2013   Procedure: PELVIC LYMPH NODE DISSECTION;  Surgeon: Sebastian Ache, MD;  Location: [facility];  Service:  Urology;  Laterality: Bilateral;   PROSTATE BIOPSY N/A 10/27/2013   Procedure: BIOPSY TRANSRECTAL ULTRASONIC PROSTATE (TUBP);  Surgeon: Milford Cage, MD;  Location: [facility];  Service: Urology;  Laterality: N/A;   PROSTATE SURGERY     RIGHT HAND SURGERY  YRS AGO   ROBOT ASSISTED LAPAROSCOPIC RADICAL PROSTATECTOMY N/A 12/18/2013   Procedure: ROBOTIC ASSISTED LAPAROSCOPIC RADICAL PROSTATECTOMY AND INDOCYANINE GREEN DYE;  Surgeon: Sebastian Ache, MD;  Location: [facility];  Service: Urology;  Laterality: N/A;   TRANSTHORACIC ECHOCARDIOGRAM  07-03-2006   MILD - MODERATE LVH/  EF 55-60%/  GRADE I DIASTOIC DYSFUNCTION/  TRIVIAL  TR /  TRIVIAL PERICARDIAL EFFUSION    Family Hx:  Family History  Problem Relation Age of Onset   Other Mother        shot and killed   Cirrhosis Father     Social History:  reports that he has never smoked. He has never used smokeless tobacco. He reports that he does not drink alcohol and does not use drugs.  Allergies:  Allergies  Allergen Reactions   Gabapentin     Other Reaction(s): Other (See Comments)  Patient reports 300mg  dose made him "feel drunk, like vertigo."  Patient reports he can tolerate lower doses (100mg ).  Patient reports 300mg  dose made him "feel drunk, like vertigo."  Patient reports he can tolerate lower doses (100mg ). States he did not wake up for 2 days after taking.  Patient reports 300mg  dose made him "feel drunk, like vertigo." Patient reports he can tolerate lower doses (100mg ).    Patient reports 300mg  dose made him "feel drunk, like vertigo." Patient reports he can tolerate lower doses (100mg ). States he did not wake up for 2 days after taking.   Penicillins Nausea And Vomiting and Rash    Has patient had a PCN reaction causing immediate rash, facial/tongue/throat swelling, SOB or lightheadedness with hypotension: yes Has patient had a PCN reaction causing severe rash involving mucus membranes or skin necrosis:  no Has patient had a PCN reaction that required hospitalization: no Has patient had a PCN reaction occurring within the last 10 years: No If all of the above answers are "NO", then may proceed with Cephalosporin use.     Medications: Prior to Admission medications   Medication Sig Start Date End Date Taking? Authorizing Provider  amLODipine (NORVASC) 10 MG tablet Take 10 mg by mouth daily.   Yes [provider]  atorvastatin (LIPITOR) 80 MG tablet Take 1 tablet by mouth daily. Take one tablet on Monday, Wednesday, and Friday. 04/11/21 05/10/23 Yes [provider]  metoprolol tartrate (LOPRESSOR) 25 MG tablet Take 25 mg by mouth 2 (two) times daily.   Yes [provider]  mycophenolate (MYFORTIC) 180 MG EC tablet Take 360 mg by mouth 2 (two) times daily.   Yes [provider]  predniSONE (DELTASONE) 5 MG tablet Take 5 mg by mouth daily with breakfast.   Yes [provider]  sulfamethoxazole-trimethoprim (BACTRIM) 400-80 MG tablet Take 1 tablet by mouth 3 (three) times a week. Take one on  Monday, Wednesday, and Friday.   Yes [provider]  tacrolimus (PROGRAF) 0.5 MG capsule Take 0.5 mg by mouth in the morning.   Yes [provider]  tacrolimus (PROGRAF) 1 MG capsule Take 1 mg by mouth 2 (two) times daily.   Yes [provider]  acetaminophen (TYLENOL) 500 MG tablet Take 1,000 mg by mouth every 6 (six) hours as needed for moderate pain.    [provider]  diltiazem (CARDIZEM CD) 180 MG 24 hr capsule Take 1 capsule (180 mg total) by mouth daily. Patient not taking: No sig reported 04/24/19 01/02/21  Terrilee Files, MD    I have reviewed the patient's current medications.  Labs:  Results for orders placed or performed during the hospital encounter of 11/14/22 (from the past 48 hour(s))  SARS Coronavirus 2 by RT PCR (hospital order, performed in [facility] hospital lab) *cepheid single result test* Anterior  Nasal Swab  Status: None   Collection Time: 11/14/22  1:31 PM   Specimen: Anterior Nasal Swab  Result Value Ref Range   SARS Coronavirus 2 by RT PCR NEGATIVE NEGATIVE    Comment: Performed at [facility] Lab, [address] [place], [place] [address]  Lactic acid, plasma     Status: Abnormal   Collection Time: 11/14/22  1:39 PM  Result Value Ref Range   Lactic Acid, Venous 3.2 (HH) 0.5 - 1.9 mmol/L    Comment: CRITICAL RESULT CALLED TO, READ BACK BY AND VERIFIED WITH Harrington Challenger RN @ [phone] 11/14/22 BUTLER J. Performed at [facility], [address] [place], [place] [address]   Comprehensive metabolic panel     Status: Abnormal   Collection Time: 11/14/22  1:39 PM  Result Value Ref Range   Sodium 137 135 - 145 mmol/L   Potassium 4.1 3.5 - 5.1 mmol/L    Comment: HEMOLYSIS AT THIS LEVEL MAY AFFECT RESULT   Chloride 107 98 - 111 mmol/L   CO2 21 (L) 22 - 32 mmol/L   Glucose, Bld 126 (H) 70 - 99 mg/dL    Comment: Glucose reference range applies only to samples taken after fasting for at least 8 hours.   BUN 10 8 - 23 mg/dL   Creatinine, Ser 1.91 (H) 0.61 - 1.24 mg/dL   Calcium 9.3 8.9 - 47.8 mg/dL   Total Protein 6.9 6.5 - 8.1 g/dL   Albumin 4.0 3.5 - 5.0 g/dL   AST 21 15 - 41 U/L    Comment: HEMOLYSIS AT THIS LEVEL MAY AFFECT RESULT   ALT 17 0 - 44 U/L    Comment: HEMOLYSIS AT THIS LEVEL MAY AFFECT RESULT   Alkaline Phosphatase 95 38 - 126 U/L   Total Bilirubin 1.8 (H) 0.3 - 1.2 mg/dL    Comment: HEMOLYSIS AT THIS LEVEL MAY AFFECT RESULT   GFR, Estimated 48 (L) >60 mL/min    Comment: (NOTE) Calculated using the CKD-EPI Creatinine Equation (2021)    Anion gap 9 5 - 15    Comment: Performed at [facility], [address] [place], [place] [address]  CBC with Differential     Status: Abnormal   Collection Time: 11/14/22  1:39 PM  Result Value Ref Range   WBC 11.1 (H) 4.0 - 10.5 K/uL   RBC 5.43 4.22 - 5.81 MIL/uL   Hemoglobin 15.4 13.0 - 17.0 g/dL   HCT 13.0  86.5 - 78.4 %   MCV 92.8 80.0 - 100.0 fL   MCH 28.4 26.0 - 34.0 pg   MCHC 30.6 30.0 - 36.0 g/dL   RDW 69.6 29.5 - 28.4 %   Platelets 151 150 - 400 K/uL   nRBC 0.0 0.0 - 0.2 %   Neutrophils Relative % 87 %   Neutro Abs 9.6 (H) 1.7 - 7.7 K/uL   Lymphocytes Relative 7 %   Lymphs Abs 0.8 0.7 - 4.0 K/uL   Monocytes Relative 5 %   Monocytes Absolute 0.5 0.1 - 1.0 K/uL   Eosinophils Relative 0 %   Eosinophils Absolute 0.0 0.0 - 0.5 K/uL   Basophils Relative 0 %   Basophils Absolute 0.0 0.0 - 0.1 K/uL   Immature Granulocytes 1 %   Abs Immature Granulocytes 0.06 0.00 - 0.07 K/uL    Comment: Performed at [facility], [address], [place], [place] [address]  Blood Culture (routine x 2)     Status: None (Preliminary result)  Collection Time: 11/14/22  2:57 PM   Specimen: BLOOD  Result Value Ref Range   Specimen Description BLOOD BLOOD RIGHT FOREARM    Special Requests      BOTTLES DRAWN AEROBIC AND ANAEROBIC Blood Culture adequate volume   Culture      NO GROWTH < 24 HOURS Performed at [facility], [address], [place], [place] [address]    Report Status PENDING   Urinalysis, Routine w reflex microscopic -Urine, Clean Catch     Status: Abnormal   Collection Time: 11/14/22  4:08 PM  Result Value Ref Range   Color, Urine YELLOW YELLOW   APPearance HAZY (A) CLEAR   Specific Gravity, Urine 1.021 1.005 - 1.030   pH 5.0 5.0 - 8.0   Glucose, UA NEGATIVE NEGATIVE mg/dL   Hgb urine dipstick MODERATE (A) NEGATIVE   Bilirubin Urine NEGATIVE NEGATIVE   Ketones, ur NEGATIVE NEGATIVE mg/dL   Protein, ur 604 (A) NEGATIVE mg/dL   Nitrite POSITIVE (A) NEGATIVE   Leukocytes,Ua MODERATE (A) NEGATIVE   RBC / HPF 0-5 0 - 5 RBC/hpf   WBC, UA >50 0 - 5 WBC/hpf   Bacteria, UA MANY (A) NONE SEEN   Squamous Epithelial / HPF 0-5 0 - 5 /HPF   WBC Clumps PRESENT    Mucus PRESENT    Amorphous Crystal PRESENT     Comment: Performed at [facility], [address]  [place], [place] [address]  Lactic acid, plasma     Status: None   Collection Time: 11/14/22  4:30 PM  Result Value Ref Range   Lactic Acid, Venous 1.9 0.5 - 1.9 mmol/L    Comment: Performed at [facility], [address] [place], [place] [address]  Blood Culture (routine x 2)     Status: None (Preliminary result)   Collection Time: 11/14/22  4:30 PM   Specimen: BLOOD RIGHT HAND  Result Value Ref Range   Specimen Description BLOOD RIGHT HAND    Special Requests      BOTTLES DRAWN AEROBIC AND ANAEROBIC Blood Culture results may not be optimal due to an inadequate volume of blood received in culture bottles   Culture      NO GROWTH < 24 HOURS Performed at [facility], [address], [place], [place] [address]    Report Status PENDING   HIV Antibody (routine testing w rflx)     Status: None   Collection Time: 11/14/22 11:38 PM  Result Value Ref Range   HIV Screen 4th Generation wRfx Non Reactive Non Reactive    Comment: Performed at [facility], [address], [place], [place] [address]  Protime-INR     Status: Abnormal   Collection Time: 11/15/22  1:49 AM  Result Value Ref Range   Prothrombin Time 15.5 (H) 11.4 - 15.2 seconds   INR 1.2 0.8 - 1.2    Comment: (NOTE) INR goal varies based on device and disease states. Performed at [facility], [address] [place], [place] [address]   Cortisol-am, blood     Status: Abnormal   Collection Time: 11/15/22  1:49 AM  Result Value Ref Range   Cortisol - AM 54.7 (H) 6.7 - 22.6 ug/dL    Comment: Performed at [facility], [address], [place], [place] [address]  Procalcitonin     Status: None   Collection Time: 11/15/22  1:49 AM  Result Value Ref Range  Procalcitonin 14.62 ng/mL    Comment:        Interpretation: PCT >= 10 ng/mL: Important systemic inflammatory response, almost exclusively due to severe bacterial sepsis or septic shock. (NOTE)       Sepsis PCT Algorithm           Lower  Respiratory Tract                                      Infection PCT Algorithm    ----------------------------     ----------------------------         PCT < 0.25 ng/mL                PCT < 0.10 ng/mL          Strongly encourage             Strongly discourage   discontinuation of antibiotics    initiation of antibiotics    ----------------------------     -----------------------------       PCT 0.25 - 0.50 ng/mL            PCT 0.10 - 0.25 ng/mL               OR       >80% decrease in PCT            Discourage initiation of                                            antibiotics      Encourage discontinuation           of antibiotics    ----------------------------     -----------------------------         PCT >= 0.50 ng/mL              PCT 0.26 - 0.50 ng/mL                AND       <80% decrease in PCT             Encourage initiation of                                             antibiotics       Encourage continuation           of antibiotics    ----------------------------     -----------------------------        PCT >= 0.50 ng/mL                  PCT > 0.50 ng/mL               AND         increase in PCT                  Strongly encourage                                      initiation of antibiotics    Strongly encourage escalation           of antibiotics                                     -----------------------------  PCT <= 0.25 ng/mL                                                 OR                                        > 80% decrease in PCT                                      Discontinue / Do not initiate                                             antibiotics  Performed at [facility], [address] [place], [place] [address]   CBC     Status: Abnormal   Collection Time: 11/15/22  1:49 AM  Result Value Ref Range   WBC 15.5 (H) 4.0 - 10.5 K/uL   RBC 4.83 4.22 - 5.81 MIL/uL   Hemoglobin 13.8 13.0 - 17.0 g/dL    HCT 60.4 54.0 - 98.1 %   MCV 91.1 80.0 - 100.0 fL   MCH 28.6 26.0 - 34.0 pg   MCHC 31.4 30.0 - 36.0 g/dL   RDW 19.1 47.8 - 29.5 %   Platelets 143 (L) 150 - 400 K/uL   nRBC 0.0 0.0 - 0.2 %    Comment: Performed at [facility], [address], [place], [place] [address]  Basic metabolic panel     Status: Abnormal   Collection Time: 11/15/22  1:49 AM  Result Value Ref Range   Sodium 136 135 - 145 mmol/L   Potassium 3.3 (L) 3.5 - 5.1 mmol/L   Chloride 105 98 - 111 mmol/L   CO2 18 (L) 22 - 32 mmol/L   Glucose, Bld 146 (H) 70 - 99 mg/dL    Comment: Glucose reference range applies only to samples taken after fasting for at least 8 hours.   BUN 14 8 - 23 mg/dL   Creatinine, Ser 8.65 (H) 0.61 - 1.24 mg/dL   Calcium 8.9 8.9 - 78.4 mg/dL   GFR, Estimated 41 (L) >60 mL/min    Comment: (NOTE) Calculated using the CKD-EPI Creatinine Equation (2021)    Anion gap 13 5 - 15    Comment: Performed at [facility], [address], [place], [place] [address]  Glucose, capillary     Status: Abnormal   Collection Time: 11/15/22  7:43 AM  Result Value Ref Range   Glucose-Capillary 125 (H) 70 - 99 mg/dL    Comment: Glucose reference range applies only to samples taken after fasting for at least 8 hours.  Glucose, capillary     Status: Abnormal   Collection Time: 11/15/22 11:17 AM  Result Value Ref Range   Glucose-Capillary 138 (H) 70 - 99 mg/dL    Comment: Glucose reference range applies only to samples taken after fasting for at least 8 hours.     ROS:  A comprehensive review of systems was negative.  Physical Exam: Vitals:   11/15/22 1010 11/15/22 1124  BP: 119/70   Pulse:  86  Resp:    Temp:    SpO2: 95%      General: Obese, pleasant, talkative black male in no acute distress HEENT: Pupils are equal round reactive to light, sugar motions are intact, mucous membranes are moist Neck: No jugular venous distention Heart: Regular rate and rhythm Lungs: Mostly  clear Abdomen: Soft, nontender, nondistended.  There is no tenderness to kidney allograft and right lower quadrant Extremities: No edema Skin: Warm and dry Neuro: Grossly nonfocal  Assessment/Plan: 61 year old black male with ESRD status post kidney transplant in 2022.  He now presents with urinary tract infection and some mild acute on chronic renal failure 1.Renal-patient with mild CKD in the setting of renal transplant.  Creatinine in January 1.24, creatinine in February 1.37.  Now presents with some mild acute on chronic renal failure in the setting of urinary tract infection.  Patient also did receive contrast for CT.  There is a UA in the system from February that was completely normal.  Imaging does not reveal stranding that would be consistent with pyelonephritis and his allograft is not tender.  Therefore, this just appears to be a UTI in the setting of renal transplant.  Initially received some broad-spectrum antibiotics now on Rocephin.  Will follow kidney function closely and check another urinalysis in the morning.  Do not feel that this scenario is consistent with rejection 2. Hypertension/volume  -blood pressure appears at but on just metoprolol twice daily.  He is getting an LR infusion which I think is fine for now 3.  Status post renal transplant-done in 10/22.  Has been maintained on Prograf, Myfortic and prednisone.  In the setting of an acute infection we will hold Myfortic.  Was given some stress dose steroids but since better will resume his regular dose of prednisone 5 in the morning    Cecille Aver 11/15/2022, 12:03 PM

## 2022-11-15 NOTE — ED Notes (Signed)
Pt's linens and gown changed at this time due to being wet.  Pt able to assist with rolling during linen change.

## 2022-11-15 NOTE — ED Notes (Signed)
Pt more awake at this time.  Pt able to tolerate PO; this RN to administer ordered PO medications.

## 2022-11-16 DIAGNOSIS — Z940 Kidney transplant status: Secondary | ICD-10-CM | POA: Diagnosis not present

## 2022-11-16 DIAGNOSIS — G9340 Encephalopathy, unspecified: Secondary | ICD-10-CM | POA: Diagnosis not present

## 2022-11-16 DIAGNOSIS — A419 Sepsis, unspecified organism: Secondary | ICD-10-CM | POA: Diagnosis not present

## 2022-11-16 DIAGNOSIS — I10 Essential (primary) hypertension: Secondary | ICD-10-CM | POA: Diagnosis not present

## 2022-11-16 LAB — CBC WITH DIFFERENTIAL/PLATELET
Abs Immature Granulocytes: 0.07 10*3/uL (ref 0.00–0.07)
Basophils Absolute: 0 10*3/uL (ref 0.0–0.1)
Basophils Relative: 0 %
Eosinophils Absolute: 0 10*3/uL (ref 0.0–0.5)
Eosinophils Relative: 0 %
HCT: 41.5 % (ref 39.0–52.0)
Hemoglobin: 12.9 g/dL — ABNORMAL LOW (ref 13.0–17.0)
Immature Granulocytes: 1 %
Lymphocytes Relative: 6 %
Lymphs Abs: 0.8 10*3/uL (ref 0.7–4.0)
MCH: 27.5 pg (ref 26.0–34.0)
MCHC: 31.1 g/dL (ref 30.0–36.0)
MCV: 88.5 fL (ref 80.0–100.0)
Monocytes Absolute: 1.7 10*3/uL — ABNORMAL HIGH (ref 0.1–1.0)
Monocytes Relative: 12 %
Neutro Abs: 11.7 10*3/uL — ABNORMAL HIGH (ref 1.7–7.7)
Neutrophils Relative %: 81 %
Platelets: 124 10*3/uL — ABNORMAL LOW (ref 150–400)
RBC: 4.69 MIL/uL (ref 4.22–5.81)
RDW: 14.9 % (ref 11.5–15.5)
WBC: 14.3 10*3/uL — ABNORMAL HIGH (ref 4.0–10.5)
nRBC: 0 % (ref 0.0–0.2)

## 2022-11-16 LAB — GLUCOSE, CAPILLARY
Glucose-Capillary: 94 mg/dL (ref 70–99)
Glucose-Capillary: 113 mg/dL — ABNORMAL HIGH (ref 70–99)
Glucose-Capillary: 129 mg/dL — ABNORMAL HIGH (ref 70–99)
Glucose-Capillary: 109 mg/dL — ABNORMAL HIGH (ref 70–99)

## 2022-11-16 LAB — URINALYSIS, COMPLETE (UACMP) WITH MICROSCOPIC
Bacteria, UA: NONE SEEN
Bilirubin Urine: NEGATIVE
Glucose, UA: NEGATIVE mg/dL
Ketones, ur: NEGATIVE mg/dL
Nitrite: NEGATIVE
Protein, ur: NEGATIVE mg/dL
Specific Gravity, Urine: 1.008 (ref 1.005–1.030)
pH: 6 (ref 5.0–8.0)

## 2022-11-16 LAB — COMPREHENSIVE METABOLIC PANEL
ALT: 17 U/L (ref 0–44)
AST: 16 U/L (ref 15–41)
Albumin: 2.9 g/dL — ABNORMAL LOW (ref 3.5–5.0)
Alkaline Phosphatase: 74 U/L (ref 38–126)
Anion gap: 7 (ref 5–15)
BUN: 16 mg/dL (ref 8–23)
CO2: 23 mmol/L (ref 22–32)
Calcium: 9 mg/dL (ref 8.9–10.3)
Chloride: 108 mmol/L (ref 98–111)
Creatinine, Ser: 1.55 mg/dL — ABNORMAL HIGH (ref 0.61–1.24)
GFR, Estimated: 50 mL/min — ABNORMAL LOW (ref >60)
Glucose, Bld: 120 mg/dL — ABNORMAL HIGH (ref 70–99)
Potassium: 4 mmol/L (ref 3.5–5.1)
Sodium: 138 mmol/L (ref 135–145)
Total Bilirubin: 0.8 mg/dL (ref 0.3–1.2)
Total Protein: 5.8 g/dL — ABNORMAL LOW (ref 6.5–8.1)

## 2022-11-16 LAB — URINE CULTURE

## 2022-11-16 MED ORDER — SENNOSIDES-DOCUSATE SODIUM 8.6-50 MG PO TABS
1.0000 | ORAL_TABLET | Freq: Two times a day (BID) | ORAL | Status: AC
  Administered 2022-11-16 (×2): 1 via ORAL
  Filled 2022-11-16 (×2): qty 1

## 2022-11-16 MED ORDER — ALUM & MAG HYDROXIDE-SIMETH 200-200-20 MG/5ML PO SUSP
30.0000 mL | ORAL | Status: DC | PRN
  Filled 2022-11-16: qty 30

## 2022-11-16 MED ORDER — MYCOPHENOLATE SODIUM 180 MG PO TBEC
360.0000 mg | DELAYED_RELEASE_TABLET | Freq: Two times a day (BID) | ORAL | Status: DC
  Administered 2022-11-17: 360 mg via ORAL
  Filled 2022-11-16: qty 2

## 2022-11-16 MED ORDER — ACETAMINOPHEN 325 MG PO TABS
650.0000 mg | ORAL_TABLET | Freq: Four times a day (QID) | ORAL | Status: DC | PRN
  Administered 2022-11-16 – 2022-11-17 (×4): 650 mg via ORAL
  Filled 2022-11-16 (×4): qty 2

## 2022-11-16 NOTE — TOC Initial Note (Signed)
Transition of Care [facility]) - Initial/Assessment Note    Patient Details  Name: [name] MRN: [id number] Date of Birth: July 14, 1960  [facility]) CM/SW Contact:    Leone Haven, RN Phone Number: 11/16/2022, 4:38 PM  Clinical Narrative:                 From home alone, indep, presents with UTI, sepsis, conts on iv abx, has PCP and insurance on file. [facility] [address], daughter , Lyla Son will transport home at [employer]. He is set up with outpatient physical therapy on [address] , located on [address].         Patient Goals and CMS Choice            Expected Discharge Plan and Services                                              Prior Living Arrangements/Services                       Activities of Daily Living      Permission Sought/Granted                  Emotional Assessment              Admission diagnosis:  Sepsis secondary to UTI (HCC) [A41.9, N39.0] Severe sepsis (HCC) [A41.9, R65.20] Patient Active Problem List   Diagnosis Date Noted   Severe sepsis (HCC) 11/14/2022   Renal transplant recipient 11/14/2022   Acute encephalopathy 11/14/2022   Acute pharyngitis 01/02/2021   Type 2 diabetes mellitus without complication, without long-term current use of insulin (HCC) 01/02/2021   GERD without esophagitis 01/02/2021   Pneumonia of left lower lobe due to infectious organism 01/02/2021   GI bleed 06/04/2020   ESRD on hemodialysis (HCC) 06/04/2020   Anxiety 06/04/2020   Complication of vascular access for dialysis 09/23/2019   Pulmonary edema 05/31/2019   Symptomatic anemia    Hemoptysis 10/01/2015   Anemia 10/01/2015   Unintentional weight loss 10/01/2015   OSA (obstructive sleep apnea)    Baker's cyst of knee    Joint effusion, knee    Effusion of right knee 09/21/2014   Epididymitis 09/21/2014   Prostate cancer (HCC) 12/18/2013   Hypokalemia 05/30/2013   Weakness generalized 05/30/2013    Prolonged QT interval 05/30/2013   Other and unspecified hyperlipidemia 05/30/2013   Essential hypertension 05/30/2013   BPH (benign prostatic hypertrophy) 05/30/2013   Acute gout 05/30/2013   PCP:  Burton Apley, MD Pharmacy:   [phone] [place] - [address] [facility] [address] [place] [address] Phone: [phone] Fax: [phone]     Social Determinants of Health (SDOH) Social History: SDOH Screenings   Tobacco Use: Low Risk  (06/12/2022)   SDOH Interventions:     Readmission Risk Interventions     No data to display

## 2022-11-16 NOTE — Progress Notes (Signed)
[facility]  PROGRESS NOTE  [name] WUJ:[id number] DOB: Nov 21, 1960 DOA: 11/14/2022 PCP: Burton Apley, MD   Brief HPI:   62 year old male with history of deceased donor renal transplant 2 years ago, hypertension, hyperlipidemia, diabetes mellitus type 2 came to ED with right-sided flank pain.  Worse with movement.  With occasional radiation down the right leg.  Denies weakness or numbness.  No bowel or bladder incontinence.  Patient is on antirejection meds and Bactrim for infection prevention.    Assessment/Plan:   UTI -Imaging not consistent with pyelonephritis, allograft nontender -Nephrology did not feel that patient has pyelonephritis -Started on IV Rocephin -Urine culture grew E. 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 as per nephrology -Prednisone restarted  Back pain -MRI lumbar spine showed multiple levels of mild disc bulge moderate spinal canal stenosis.  No acute abnormality otherwise noted -Will need neurosurgical evaluation as outpatient  Hypertension -Blood pressure is well-controlled, continue metoprolol  Metabolic encephalopathy -Insetting of UTI -Resolved   Medications     atorvastatin  80 mg Oral Daily   enoxaparin (LOVENOX) injection  40 mg Subcutaneous Q24H   lidocaine  1 patch Transdermal Q24H   metoprolol tartrate  25 mg Oral BID   [START ON 11/17/2022] mycophenolate  360 mg Oral BID   predniSONE  5 mg Oral Q breakfast   senna-docusate  1 tablet Oral BID   tacrolimus  0.5 mg Oral q AM   tacrolimus  1 mg Oral BID     Data Reviewed:   CBG:  Recent Labs  Lab 11/15/22 1117 11/15/22 1628 11/15/22 2045 11/16/22 0603 11/16/22 1137  GLUCAP 138* 155* 203* 94 113*    SpO2: 98 %    Vitals:   11/15/22 2344 11/16/22  0408 11/16/22 0419 11/16/22 0937  BP: 139/86 121/80  118/76  Pulse: 76 80    Resp: 19 18  18   Temp: 98.7 F (37.1 C) 98.7 F (37.1 C)  98.3 F (36.8 C)  TempSrc: Oral Oral  Oral  SpO2: 96% 96%  98%  Weight:   125 kg       Data Reviewed:  Basic Metabolic Panel: Recent Labs  Lab 11/14/22 1339 11/15/22 0149 11/16/22 0105  NA 137 136 138  K 4.1 3.3* 4.0  CL 107 105 108  CO2 21* 18* 23  GLUCOSE 126* 146* 120*  BUN 10 14 16   CREATININE 1.59* 1.82* 1.55*  CALCIUM 9.3 8.9 9.0    CBC: Recent Labs  Lab 11/14/22 1339 11/15/22 0149 11/16/22 0105  WBC 11.1* 15.5* 14.3*  NEUTROABS 9.6*  --  11.7*  HGB 15.4 13.8 12.9*  HCT 50.4 44.0 41.5  MCV 92.8 91.1 88.5  PLT 151 143* 124*    LFT Recent Labs  Lab 11/14/22 1339 11/16/22 0105  AST 21 16  ALT 17 17  ALKPHOS 95 74  BILITOT 1.8* 0.8  PROT 6.9 5.8*  ALBUMIN 4.0 2.9*     Antibiotics: Anti-infectives (From admission, onward)    Start     Dose/Rate Route Frequency Ordered Stop   11/15/22 1000  cefTRIAXone (ROCEPHIN) 2 g in sodium chloride 0.9 % 100 mL IVPB        2 g 200 mL/hr over 30 Minutes Intravenous Every 24 hours 11/14/22 2041     11/14/22 1615  vancomycin (VANCOREADY) IVPB 2000 mg/400 mL        2,000 mg 200 mL/hr over 120 Minutes Intravenous  Once 11/14/22 1601 11/14/22 2115   11/14/22 1545  ceFEPIme (MAXIPIME) 2 g in sodium chloride 0.9 % 100 mL IVPB        2 g 200 mL/hr over 30 Minutes Intravenous  Once 11/14/22 1543 11/14/22 1635   11/14/22 1545  metroNIDAZOLE (FLAGYL) IVPB 500 mg        500 mg 100 mL/hr over 60 Minutes Intravenous  Once 11/14/22 1543 11/14/22 1813   11/14/22 1545  vancomycin (VANCOCIN) IVPB 1000 mg/200 mL premix  Status:  Discontinued        1,000 mg 200 mL/hr over 60 Minutes Intravenous  Once 11/14/22 1543 11/14/22 1601   11/14/22 1500  cefTRIAXone (ROCEPHIN) 1 g in sodium chloride 0.9 % 100 mL IVPB  Status:  Discontinued        1 g 200 mL/hr over 30 Minutes Intravenous  Once  11/14/22 1457 11/14/22 1542        DVT prophylaxis: Lovenox  Code Status: Full code  Family Communication: Discussed with wife at bedside   CONSULTS nephrology   Subjective   Patient seen and examined, denies any complaints.   Objective    Physical Examination:   General-appears in no acute distress Heart-S1-S2, regular, no murmur auscultated Lungs-clear to auscultation bilaterally, no wheezing or crackles auscultated Abdomen-soft, nontender, no organomegaly Extremities-no edema in the lower extremities Neuro-alert, oriented x3, no focal deficit noted   Status is: Inpatient:             [name]   [facility] If 7PM-7AM, please contact night-coverage at [email or web address], Office  [phone]   11/16/2022, 4:26 PM  LOS: 1 day

## 2022-11-16 NOTE — Evaluation (Signed)
Physical Therapy Evaluation Patient Details Name: [name] MRN: [id number] DOB: 08-20-1960 Today's Date: 11/16/2022  History of Present Illness  Pt is a 62 year old male admitted on 11/14/22 for flank pain. 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  Clinical Impression  Pt presents with admitting diagnosis above. Pt reports that at baseline he is independent with no AD however over the last 3 weeks has been limited by onset of low back pain. Pt reported no change in back pain with repeated extensions and ambulation today. Despite this, Pt presents at or near baseline mobility. Pt has no further acute PT needs and will be signing off. Pt educated that OPPT would be beneficial for treating low back pain. Re consult PT if mobility status changes.      Recommendations for follow up therapy are one component of a multi-disciplinary discharge planning process, led by the attending physician.  Recommendations may be updated based on patient status, additional functional criteria and insurance authorization.  Follow Up Recommendations       Assistance Recommended at Discharge PRN  Patient can return home with the following  Help with stairs or ramp for entrance;Assistance with cooking/housework    Equipment Recommendations None recommended by PT  Recommendations for Other Services       Functional Status Assessment Patient has had a recent decline in their functional status and demonstrates the ability to make significant improvements in function in a reasonable and predictable amount of time.     Precautions / Restrictions        Mobility  Bed Mobility               General bed mobility comments: Pt received up in chair    Transfers Overall transfer level: Independent Equipment used: None                    Ambulation/Gait Ambulation/Gait assistance: Independent Gait Distance  (Feet): 450 Feet Assistive device: None Gait Pattern/deviations: WFL(Within Functional Limits) Gait velocity: decreased     General Gait Details: Pt with slow steady gait. no LOB noted  Stairs            Wheelchair Mobility    Modified Rankin (Stroke Patients Only)       Balance Overall balance assessment: No apparent balance deficits (not formally assessed)                                           Pertinent Vitals/Pain Pain Assessment Pain Assessment: 0-10 Pain Score: 6  Pain Location: Low back Pain Descriptors / Indicators: Constant, Aching, Discomfort, Grimacing, Shooting Pain Intervention(s): Monitored during session    Home Living Family/patient expects to be discharged to:: Private residence Living Arrangements: Spouse/significant other Available Help at Discharge: Family;Available PRN/intermittently Type of Home: House Home Access: Ramped entrance     Alternate Level Stairs-Number of Steps: Flight Home Layout: Two level;Able to live on main level with bedroom/bathroom Home Equipment: None      Prior Function Prior Level of Function : Independent/Modified Independent;Driving             Mobility Comments: Ind no AD. Mostly around the house ADLs Comments: Ind     Hand Dominance   Dominant Hand: Right    Extremity/Trunk Assessment   Upper Extremity  Assessment Upper Extremity Assessment: Overall WFL for tasks assessed    Lower Extremity Assessment Lower Extremity Assessment: Overall WFL for tasks assessed    Cervical / Trunk Assessment Cervical / Trunk Assessment: Normal  Communication   Communication: No difficulties  Cognition Arousal/Alertness: Awake/alert Behavior During Therapy: WFL for tasks assessed/performed Overall Cognitive Status: Within Functional Limits for tasks assessed                                          General Comments General comments (skin integrity, edema, etc.): VSS on  RA    Exercises Other Exercises Other Exercises: Repeated Extensions x10   Assessment/Plan    PT Assessment All further PT needs can be met in the next venue of care  PT Problem List Decreased activity tolerance;Decreased mobility;Pain;Obesity       PT Treatment Interventions      PT Goals (Current goals can be found in the Care Plan section)       Frequency       Co-evaluation               AM-PAC PT "6 Clicks" Mobility  Outcome Measure Help needed turning from your back to your side while in a flat bed without using bedrails?: None Help needed moving from lying on your back to sitting on the side of a flat bed without using bedrails?: None Help needed moving to and from a bed to a chair (including a wheelchair)?: None Help needed standing up from a chair using your arms (e.g., wheelchair or bedside chair)?: None Help needed to walk in hospital room?: None Help needed climbing 3-5 steps with a railing? : A Little 6 Click Score: 23    End of Session Equipment Utilized During Treatment: Gait belt Activity Tolerance: Patient tolerated treatment well Patient left: in chair;with call bell/phone within reach;with family/visitor present;with nursing/sitter in room Nurse Communication: Mobility status PT Visit Diagnosis: Other abnormalities of gait and mobility (R26.89)    Time: 4098-1191 PT Time Calculation (min) (ACUTE ONLY): 30 min   Charges:   PT Evaluation $PT Eval Low Complexity: 1 Low PT Treatments $Gait Training: 8-22 mins        Shela Nevin, PT, DPT Acute Rehab Services [phone]   Gladys Damme 11/16/2022, 3:37 PM

## 2022-11-16 NOTE — Progress Notes (Signed)
Subjective:  No new issues-  good UOP-  crt down -  urine looking clearer this AM-  main issue is back pain   Objective Vital signs in last 24 hours: Vitals:   11/15/22 1944 11/15/22 2344 11/16/22 0408 11/16/22 0419  BP: 108/69 139/86 121/80   Pulse: 84 76 80   Resp: 18 19 18    Temp: 98.1 F (36.7 C) 98.7 F (37.1 C) 98.7 F (37.1 C)   TempSrc: Oral Oral Oral   SpO2: 97% 96% 96%   Weight:    125 kg   Weight change:   Intake/Output Summary (Last 24 hours) at 11/16/2022 0748 Last data filed at 11/16/2022 0535 Gross per 24 hour  Intake 1200 ml  Output 2550 ml  Net -1350 ml    Assessment/Plan: 62 year old black male with ESRD status post kidney transplant in 2022.  He now presents with urinary tract infection and some mild acute on chronic renal failure 1.Renal-patient with mild CKD in the setting of renal transplant.  Creatinine in January 1.24, creatinine in February 1.37.  Now presents with some mild acute on chronic renal failure in the setting of urinary tract infection.  Patient also did receive contrast for CT.  There is a UA in the system from February that was completely normal.  Imaging does not reveal stranding that would be consistent with pyelonephritis and his allograft is not tender.  Therefore, this just appears to be a UTI in the setting of renal transplant.  Initially received some broad-spectrum antibiotics now on Rocephin.  This AM crt trending down and urine clearer-  from just a UTI standpoint-  could be transitioned to PO abx and followed as OP 2. Hypertension/volume  -blood pressure fine as is volume status 3.  Status post renal transplant-done in 10/22.  Has been maintained on Prograf, Myfortic and prednisone.  In the setting of an acute infection we will hold Myfortic.  Was given some stress dose steroids but since better will resume his regular dose of prednisone 5 in the morning.  Is OK to resume myfortic in the AM 4. Back pain-  is his main issue and I dont think  related to this UTI-  MRI without anything emergent-  may just need OP referral   From my standpoint-  is improved-  clear for discharge from my standpoint on po ABX for total abx of 10 days and to resume regular dose myfortic tomorrow-  I can arrange OP labs thru our office.  I will sign off, call with questions     Cecille Aver    Labs: Basic Metabolic Panel: Recent Labs  Lab 11/14/22 1339 11/15/22 0149 11/16/22 0105  NA 137 136 138  K 4.1 3.3* 4.0  CL 107 105 108  CO2 21* 18* 23  GLUCOSE 126* 146* 120*  BUN 10 14 16   CREATININE 1.59* 1.82* 1.55*  CALCIUM 9.3 8.9 9.0   Liver Function Tests: Recent Labs  Lab 11/14/22 1339 11/16/22 0105  AST 21 16  ALT 17 17  ALKPHOS 95 74  BILITOT 1.8* 0.8  PROT 6.9 5.8*  ALBUMIN 4.0 2.9*   No results for input(s): "LIPASE", "AMYLASE" in the last 168 hours. No results for input(s): "AMMONIA" in the last 168 hours. CBC: Recent Labs  Lab 11/14/22 1339 11/15/22 0149 11/16/22 0105  WBC 11.1* 15.5* 14.3*  NEUTROABS 9.6*  --  11.7*  HGB 15.4 13.8 12.9*  HCT 50.4 44.0 41.5  MCV 92.8 91.1 88.5  PLT 151  143* 124*   Cardiac Enzymes: No results for input(s): "CKTOTAL", "CKMB", "CKMBINDEX", "TROPONINI" in the last 168 hours. CBG: Recent Labs  Lab 11/15/22 0743 11/15/22 1117 11/15/22 1628 11/15/22 2045 11/16/22 0603  GLUCAP 125* 138* 155* 203* 94    Iron Studies: No results for input(s): "IRON", "TIBC", "TRANSFERRIN", "FERRITIN" in the last 72 hours. Studies/Results: MR Lumbar Spine W Wo Contrast  Result Date: 11/14/2022 CLINICAL DATA:  Back pain radiating down right lower extremity, immunocompromised, fever EXAM: MRI LUMBAR SPINE WITHOUT AND WITH CONTRAST TECHNIQUE: Multiplanar and multiecho pulse sequences of the lumbar spine were obtained without and with intravenous contrast. CONTRAST:  10mL GADAVIST GADOBUTROL 1 MMOL/ML IV SOLN COMPARISON:  12/25/2017 MRI lumbar spine and 04/22/2022 CT lumbar spine FINDINGS:  Segmentation:  5 lumbar type vertebral bodies. Alignment:  No significant listhesis.  Levocurvature. Vertebrae: No acute fracture or evidence of discitis. Redemonstrated T2 hyperintense lesion in the inferior aspect of L1, which appears similar to the prior exam and is favored to represent an atypical hemangioma. Progressive Schmorl's node formation at the endplates at L2-L3, L3-L4, and L4-L5, with additional endplate degenerative changes at L4-L5. No suspicious osseous lesion. No abnormal osseous enhancement. Conus medullaris and cauda equina: Conus extends to the L1-L2 level. Conus and cauda equina appear normal. No abnormal enhancement. Paraspinal and other soft tissues: Atrophic native kidneys. Right lower quadrant transplant kidney no lymphadenopathy. Disc levels: T12-L1: No significant disc bulge. Mild facet arthropathy. No spinal canal stenosis or neural foraminal narrowing. L1-L2: No significant disc bulge. No spinal canal stenosis or neural foraminal narrowing. L2-L3: Mild to moderate disc bulge with right foraminal/extreme lateral disc protrusion. Mild facet arthropathy. Ligamentum flavum hypertrophy. Narrowing of the lateral recesses. Mild-to-moderate spinal canal stenosis, which has progressed slightly from the prior exam. No neural foraminal narrowing. Posterior to the L3 vertebral body, there is mild to moderate thecal sac narrowing secondary to epidural lipomatosis (series 8, image 20). L3-L4: Mild disc bulge with right foraminal/extreme lateral disc protrusion, which likely contacts the exiting right L3 nerve. Mild facet arthropathy. Ligamentum flavum hypertrophy. Mild spinal canal stenosis and mild right neural foraminal narrowing, unchanged. Posterior to the L4 vertebral body, there is moderate thecal sac narrowing, in part secondary to epidural lipomatosis (series 8, image 26). L4-L5: Mild disc bulge, somewhat eccentric to the left. Mild-to-moderate facet arthropathy. Narrowing of the lateral  recesses. Mild thecal sac narrowing, in part secondary to epidural lipomatosis, new from prior exam. Mild-to-moderate left neural foraminal narrowing, unchanged. Mild thecal sac narrowing is also noted posterior to the L5 vertebral body, secondary to epidural lipomatosis. L5-S1: Mild disc bulge, which may contact the exiting right greater than left L5 nerves. Superimposed central disc protrusion. Mild facet arthropathy. No spinal canal stenosis or neural foraminal narrowing. IMPRESSION: 1. No evidence of discitis or osteomyelitis. No epidural abscess. 2. L2-L3 mild-to-moderate spinal canal stenosis, which has progressed slightly from the prior exam. Narrowing of the lateral recesses could affect the descending L3 nerve roots. 3. L3-L4 mild spinal canal stenosis and mild right neural foraminal narrowing, unchanged. 4. L4-L5 mild-to-moderate left neural foraminal narrowing, unchanged. Narrowing of the lateral recesses at this level could affect the descending L5 nerve roots 5. L5-S1 disc bulge may contact the exiting right greater than left L5 nerves. 6. Epidural lipomatosis causes thecal sac narrowing posterior to the L3, L4, and L5 vertebral bodies, which is moderate at L4 and mild at L3 and L5. 7. Progressive Schmorl's node formation at L2-L3, L3-L4, and L4-L5, which can be a  cause back pain. Electronically Signed   By: Wiliam Ke M.D.   On: 11/14/2022 20:11   CT ABDOMEN PELVIS W CONTRAST  Result Date: 11/14/2022 CLINICAL DATA:  Right flank pain. EXAM: CT ABDOMEN AND PELVIS WITH CONTRAST TECHNIQUE: Multidetector CT imaging of the abdomen and pelvis was performed using the standard protocol following bolus administration of intravenous contrast. RADIATION DOSE REDUCTION: This exam was performed according to the departmental dose-optimization program which includes automated exposure control, adjustment of the mA and/or kV according to patient size and/or use of iterative reconstruction technique. CONTRAST:   60mL OMNIPAQUE IOHEXOL 350 MG/ML SOLN COMPARISON:  March 16, 2022. FINDINGS: Lower chest: Minimal bibasilar subsegmental atelectasis is noted. Hepatobiliary: No focal liver abnormality is seen. No gallstones, gallbladder wall thickening, or biliary dilatation. Pancreas: Unremarkable. No pancreatic ductal dilatation or surrounding inflammatory changes. Spleen: Normal in size without focal abnormality. Adrenals/Urinary Tract: Adrenal glands appear normal. Bilateral renal atrophy is noted consistent with end-stage renal disease. Transplant is noted in right lower quadrant. No hydronephrosis or renal obstruction is noted. Urinary bladder is unremarkable. Stomach/Bowel: Stomach is within normal limits. Appendix appears normal. No evidence of bowel wall thickening, distention, or inflammatory changes. Vascular/Lymphatic: Aortic atherosclerosis. No enlarged abdominal or pelvic lymph nodes. Reproductive: Status post prostatectomy. Other: No abdominal wall hernia or abnormality. No abdominopelvic ascites. Musculoskeletal: No acute or significant osseous findings. IMPRESSION: No acute abnormality seen in the abdomen or pelvis. Aortic Atherosclerosis (ICD10-I70.0). Electronically Signed   By: Lupita Raider M.D.   On: 11/14/2022 16:09   Medications: Infusions:  cefTRIAXone (ROCEPHIN)  IV 2 g (11/15/22 1034)    Scheduled Medications:  atorvastatin  80 mg Oral Daily   enoxaparin (LOVENOX) injection  40 mg Subcutaneous Q24H   lidocaine  1 patch Transdermal Q24H   metoprolol tartrate  25 mg Oral BID   predniSONE  5 mg Oral Q breakfast   tacrolimus  0.5 mg Oral q AM   tacrolimus  1 mg Oral BID    have reviewed scheduled and prn medications.  Physical Exam: General:  just c/o back pain-  alert Heart: RRR Lungs: clear Abdomen: soft, non tender Extremities: no edema Dialysis Access: has patent left AVF    11/16/2022,7:48 AM  LOS: 1 day

## 2022-11-17 DIAGNOSIS — I10 Essential (primary) hypertension: Secondary | ICD-10-CM | POA: Diagnosis not present

## 2022-11-17 DIAGNOSIS — N3000 Acute cystitis without hematuria: Secondary | ICD-10-CM

## 2022-11-17 DIAGNOSIS — A419 Sepsis, unspecified organism: Secondary | ICD-10-CM | POA: Diagnosis not present

## 2022-11-17 DIAGNOSIS — G9340 Encephalopathy, unspecified: Secondary | ICD-10-CM | POA: Diagnosis not present

## 2022-11-17 DIAGNOSIS — Z940 Kidney transplant status: Secondary | ICD-10-CM | POA: Diagnosis not present

## 2022-11-17 LAB — CBC
HCT: 40.7 % (ref 39.0–52.0)
Hemoglobin: 12.6 g/dL — ABNORMAL LOW (ref 13.0–17.0)
MCH: 27.3 pg (ref 26.0–34.0)
MCHC: 31 g/dL (ref 30.0–36.0)
MCV: 88.3 fL (ref 80.0–100.0)
Platelets: 143 10*3/uL — ABNORMAL LOW (ref 150–400)
RBC: 4.61 MIL/uL (ref 4.22–5.81)
RDW: 14.9 % (ref 11.5–15.5)
WBC: 8 10*3/uL (ref 4.0–10.5)
nRBC: 0 % (ref 0.0–0.2)

## 2022-11-17 LAB — URINE CULTURE: Culture: >=100000 — AB

## 2022-11-17 LAB — COMPREHENSIVE METABOLIC PANEL
ALT: 17 U/L (ref 0–44)
AST: 17 U/L (ref 15–41)
Albumin: 3 g/dL — ABNORMAL LOW (ref 3.5–5.0)
Alkaline Phosphatase: 79 U/L (ref 38–126)
Anion gap: 7 (ref 5–15)
BUN: 18 mg/dL (ref 8–23)
CO2: 23 mmol/L (ref 22–32)
Calcium: 8.9 mg/dL (ref 8.9–10.3)
Chloride: 109 mmol/L (ref 98–111)
Creatinine, Ser: 1.56 mg/dL — ABNORMAL HIGH (ref 0.61–1.24)
GFR, Estimated: 50 mL/min — ABNORMAL LOW (ref >60)
Glucose, Bld: 117 mg/dL — ABNORMAL HIGH (ref 70–99)
Potassium: 3.8 mmol/L (ref 3.5–5.1)
Sodium: 139 mmol/L (ref 135–145)
Total Bilirubin: 0.5 mg/dL (ref 0.3–1.2)
Total Protein: 6 g/dL — ABNORMAL LOW (ref 6.5–8.1)

## 2022-11-17 LAB — GLUCOSE, CAPILLARY: Glucose-Capillary: 90 mg/dL (ref 70–99)

## 2022-11-17 LAB — CULTURE, BLOOD (ROUTINE X 2)
Culture: NO GROWTH
Culture: NO GROWTH

## 2022-11-17 MED ORDER — CEFADROXIL 500 MG PO CAPS: 500.0000 mg | ORAL_CAPSULE | Freq: Two times a day (BID) | ORAL | 0 refills | Status: AC

## 2022-11-17 MED ORDER — CEFADROXIL 500 MG PO CAPS
500.0000 mg | ORAL_CAPSULE | Freq: Two times a day (BID) | ORAL | Status: DC
  Administered 2022-11-17: 500 mg via ORAL
  Filled 2022-11-17 (×2): qty 1

## 2022-11-17 MED ORDER — METHOCARBAMOL 500 MG PO TABS: 500.0000 mg | ORAL_TABLET | Freq: Three times a day (TID) | ORAL | 0 refills | Status: AC | PRN

## 2022-11-17 NOTE — Progress Notes (Signed)
PO abx dose given.

## 2022-11-17 NOTE — Discharge Summary (Signed)
Physician Discharge Summary   Patient: [name] MRN: [id number] DOB: 1960-07-21  Admit date:     11/14/2022  Discharge date: 11/17/22  Discharge Physician: [name]   PCP: Burton Apley, MD   Recommendations at discharge:   Follow-up PCP in 1 week Outpatient PT has been set up You will need referral for neurosurgery as outpatient  Discharge Diagnoses: Principal Problem:   Severe sepsis [facility]) Active Problems:   Renal transplant recipient   Acute encephalopathy   Essential hypertension   Type 2 diabetes mellitus without complication, without long-term current use of insulin (HCC)  Resolved Problems:   * No resolved hospital problems. *  Hospital Course: 62 year old male with history of deceased donor renal transplant 2 years ago, hypertension, hyperlipidemia, diabetes mellitus type 2 came to ED with right-sided flank pain.  Worse with movement.  With occasional radiation down the right leg.  Denies weakness or numbness.  No bowel or bladder incontinence.  Patient is on antirejection meds and Bactrim for infection prevention.    Assessment and Plan:  UTI -Imaging not consistent with pyelonephritis, allograft nontender -Nephrology did not feel that patient has pyelonephritis -Started on IV Rocephin -Urine culture grew E. coli, sensitive to cefadroxil -Will discharge on cefadroxil 1 tablet p.o. 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 as per nephrology -Prednisone restarted   Back pain -MRI lumbar spine showed multiple levels of mild disc bulge moderate spinal canal stenosis.  No acute abnormality otherwise noted -Will need neurosurgical evaluation as outpatient -Outpatient PT has been set up -Will discharge on Robaxin 500 mg p.o. every 8 hours as  needed   Hypertension -Blood pressure is well-controlled, continue metoprolol   Metabolic encephalopathy -Insetting of UTI -Resolved       Consultants: Nephrology Procedures performed:  Disposition: Home Diet recommendation:  Discharge Diet Orders (From admission, onward)     Start     Ordered   11/17/22 0000  Diet - low sodium heart healthy        11/17/22 0929           Regular diet DISCHARGE MEDICATION: Allergies as of 11/17/2022       Reactions   Gabapentin    Other Reaction(s): Other (See Comments) Patient reports 300mg  dose made him "feel drunk, like vertigo."  Patient reports he can tolerate lower doses (100mg ). Patient reports 300mg  dose made him "feel drunk, like vertigo."  Patient reports he can tolerate lower doses (100mg ). States he did not wake up for 2 days after taking. Patient reports 300mg  dose made him "feel drunk, like vertigo." Patient reports he can tolerate lower doses (100mg ).    Patient reports 300mg  dose made him "feel drunk, like vertigo." Patient reports he can tolerate lower doses (100mg ). States he did not wake up for 2 days after taking.   Penicillins Nausea And Vomiting, Rash   Has patient had a PCN reaction causing immediate rash, facial/tongue/throat swelling, SOB or lightheadedness with hypotension: yes Has patient had a PCN reaction causing severe rash involving mucus membranes or skin necrosis: no Has patient had a PCN reaction that required hospitalization: no Has patient had a PCN reaction occurring within the last 10 years: No If all of the above answers are "NO", then may proceed with Cephalosporin use.  Medication List     TAKE these medications    acetaminophen 500 MG tablet Commonly known as: TYLENOL Take 1,000 mg by mouth every 6 (six) hours as needed for moderate pain.   amLODipine 10 MG tablet Commonly known as: NORVASC Take 10 mg by mouth daily.   atorvastatin 80 MG tablet Commonly known as:  LIPITOR Take 1 tablet by mouth daily. Take one tablet on Monday, Wednesday, and Friday.   cefadroxil 500 MG capsule Commonly known as: DURICEF Take 1 capsule (500 mg total) by mouth 2 (two) times daily for 7 days.   methocarbamol 500 MG tablet Commonly known as: ROBAXIN Take 1 tablet (500 mg total) by mouth every 8 (eight) hours as needed for up to 10 days for muscle spasms.   metoprolol tartrate 25 MG tablet Commonly known as: LOPRESSOR Take 25 mg by mouth 2 (two) times daily.   mycophenolate 180 MG EC tablet Commonly known as: MYFORTIC Take 360 mg by mouth 2 (two) times daily.   predniSONE 5 MG tablet Commonly known as: DELTASONE Take 5 mg by mouth daily with breakfast.   sulfamethoxazole-trimethoprim 400-80 MG tablet Commonly known as: BACTRIM Take 1 tablet by mouth 3 (three) times a week. Take one on  Monday, Wednesday, and Friday.   tacrolimus 0.5 MG capsule Commonly known as: PROGRAF Take 0.5 mg by mouth in the morning.   tacrolimus 1 MG capsule Commonly known as: PROGRAF Take 1 mg by mouth 2 (two) times daily.        Follow-up Information     [facility] Follow up.   Specialty: Rehabilitation Why: They will call you to set up apt times if you do not hear from themin 3 business days please give them a call. thanks Contact information: [address] [id number] [place]c [address] [place] [address] [phone]        Burton Apley, MD Follow up in 1 week(s).   Specialty: Internal Medicine Why: Will need referral for neurosurgery as outpatient Contact information: [address] [place] [address] [phone]                Discharge Exam: Ceasar Mons Weights   11/16/22 0419 11/17/22 0502  Weight: 125 kg 121.3 kg   General-appears in no acute distress Heart-S1-S2, regular, no murmur auscultated Lungs-clear to auscultation bilaterally, no wheezing or crackles  auscultated Abdomen-soft, nontender, no organomegaly Extremities-no edema in the lower extremities Neuro-alert, oriented x3, no focal deficit noted  Condition at discharge: good  The results of significant diagnostics from this hospitalization (including imaging, microbiology, ancillary and laboratory) are listed below for reference.   Imaging Studies: MR Lumbar Spine W Wo Contrast  Result Date: 11/14/2022 CLINICAL DATA:  Back pain radiating down right lower extremity, immunocompromised, fever EXAM: MRI LUMBAR SPINE WITHOUT AND WITH CONTRAST TECHNIQUE: Multiplanar and multiecho pulse sequences of the lumbar spine were obtained without and with intravenous contrast. CONTRAST:  10mL GADAVIST GADOBUTROL 1 MMOL/ML IV SOLN COMPARISON:  12/25/2017 MRI lumbar spine and 04/22/2022 CT lumbar spine FINDINGS: Segmentation:  5 lumbar type vertebral bodies. Alignment:  No significant listhesis.  Levocurvature. Vertebrae: No acute fracture or evidence of discitis. Redemonstrated T2 hyperintense lesion in the inferior aspect of L1, which appears similar to the prior exam and is favored to represent an atypical hemangioma. Progressive Schmorl's node formation at the endplates at L2-L3, L3-L4, and L4-L5, with additional endplate degenerative changes at L4-L5. No suspicious osseous lesion. No abnormal osseous enhancement. Conus medullaris  and cauda equina: Conus extends to the L1-L2 level. Conus and cauda equina appear normal. No abnormal enhancement. Paraspinal and other soft tissues: Atrophic native kidneys. Right lower quadrant transplant kidney no lymphadenopathy. Disc levels: T12-L1: No significant disc bulge. Mild facet arthropathy. No spinal canal stenosis or neural foraminal narrowing. L1-L2: No significant disc bulge. No spinal canal stenosis or neural foraminal narrowing. L2-L3: Mild to moderate disc bulge with right foraminal/extreme lateral disc protrusion. Mild facet arthropathy. Ligamentum flavum  hypertrophy. Narrowing of the lateral recesses. Mild-to-moderate spinal canal stenosis, which has progressed slightly from the prior exam. No neural foraminal narrowing. Posterior to the L3 vertebral body, there is mild to moderate thecal sac narrowing secondary to epidural lipomatosis (series 8, image 20). L3-L4: Mild disc bulge with right foraminal/extreme lateral disc protrusion, which likely contacts the exiting right L3 nerve. Mild facet arthropathy. Ligamentum flavum hypertrophy. Mild spinal canal stenosis and mild right neural foraminal narrowing, unchanged. Posterior to the L4 vertebral body, there is moderate thecal sac narrowing, in part secondary to epidural lipomatosis (series 8, image 26). L4-L5: Mild disc bulge, somewhat eccentric to the left. Mild-to-moderate facet arthropathy. Narrowing of the lateral recesses. Mild thecal sac narrowing, in part secondary to epidural lipomatosis, new from prior exam. Mild-to-moderate left neural foraminal narrowing, unchanged. Mild thecal sac narrowing is also noted posterior to the L5 vertebral body, secondary to epidural lipomatosis. L5-S1: Mild disc bulge, which may contact the exiting right greater than left L5 nerves. Superimposed central disc protrusion. Mild facet arthropathy. No spinal canal stenosis or neural foraminal narrowing. IMPRESSION: 1. No evidence of discitis or osteomyelitis. No epidural abscess. 2. L2-L3 mild-to-moderate spinal canal stenosis, which has progressed slightly from the prior exam. Narrowing of the lateral recesses could affect the descending L3 nerve roots. 3. L3-L4 mild spinal canal stenosis and mild right neural foraminal narrowing, unchanged. 4. L4-L5 mild-to-moderate left neural foraminal narrowing, unchanged. Narrowing of the lateral recesses at this level could affect the descending L5 nerve roots 5. L5-S1 disc bulge may contact the exiting right greater than left L5 nerves. 6. Epidural lipomatosis causes thecal sac narrowing  posterior to the L3, L4, and L5 vertebral bodies, which is moderate at L4 and mild at L3 and L5. 7. Progressive Schmorl's node formation at L2-L3, L3-L4, and L4-L5, which can be a cause back pain. Electronically Signed   By: Wiliam Ke M.D.   On: 11/14/2022 20:11   CT ABDOMEN PELVIS W CONTRAST  Result Date: 11/14/2022 CLINICAL DATA:  Right flank pain. EXAM: CT ABDOMEN AND PELVIS WITH CONTRAST TECHNIQUE: Multidetector CT imaging of the abdomen and pelvis was performed using the standard protocol following bolus administration of intravenous contrast. RADIATION DOSE REDUCTION: This exam was performed according to the departmental dose-optimization program which includes automated exposure control, adjustment of the mA and/or kV according to patient size and/or use of iterative reconstruction technique. CONTRAST:  60mL OMNIPAQUE IOHEXOL 350 MG/ML SOLN COMPARISON:  March 16, 2022. FINDINGS: Lower chest: Minimal bibasilar subsegmental atelectasis is noted. Hepatobiliary: No focal liver abnormality is seen. No gallstones, gallbladder wall thickening, or biliary dilatation. Pancreas: Unremarkable. No pancreatic ductal dilatation or surrounding inflammatory changes. Spleen: Normal in size without focal abnormality. Adrenals/Urinary Tract: Adrenal glands appear normal. Bilateral renal atrophy is noted consistent with end-stage renal disease. Transplant is noted in right lower quadrant. No hydronephrosis or renal obstruction is noted. Urinary bladder is unremarkable. Stomach/Bowel: Stomach is within normal limits. Appendix appears normal. No evidence of bowel wall thickening, distention, or inflammatory changes. Vascular/Lymphatic:  Aortic atherosclerosis. No enlarged abdominal or pelvic lymph nodes. Reproductive: Status post prostatectomy. Other: No abdominal wall hernia or abnormality. No abdominopelvic ascites. Musculoskeletal: No acute or significant osseous findings. IMPRESSION: No acute abnormality seen in the  abdomen or pelvis. Aortic Atherosclerosis (ICD10-I70.0). Electronically Signed   By: Lupita Raider M.D.   On: 11/14/2022 16:09    Microbiology: Results for orders placed or performed during the hospital encounter of 11/14/22  SARS Coronavirus 2 by RT PCR (hospital order, performed in [facility] hospital lab) *cepheid single result test* Anterior Nasal Swab     Status: None   Collection Time: 11/14/22  1:31 PM   Specimen: Anterior Nasal Swab  Result Value Ref Range Status   SARS Coronavirus 2 by RT PCR NEGATIVE NEGATIVE Final    Comment: Performed at [facility] Lab, [address] [place], [place] [address]  Blood Culture (routine x 2)     Status: None (Preliminary result)   Collection Time: 11/14/22  2:57 PM   Specimen: BLOOD  Result Value Ref Range Status   Specimen Description BLOOD BLOOD RIGHT FOREARM  Final   Special Requests   Final    BOTTLES DRAWN AEROBIC AND ANAEROBIC Blood Culture adequate volume   Culture   Final    NO GROWTH 3 DAYS Performed at [facility], [address] [place], [place] [address]    Report Status PENDING  Incomplete  Blood Culture (routine x 2)     Status: None (Preliminary result)   Collection Time: 11/14/22  4:30 PM   Specimen: BLOOD RIGHT HAND  Result Value Ref Range Status   Specimen Description BLOOD RIGHT HAND  Final   Special Requests   Final    BOTTLES DRAWN AEROBIC AND ANAEROBIC Blood Culture results may not be optimal due to an inadequate volume of blood received in culture bottles   Culture   Final    NO GROWTH 3 DAYS Performed at [facility], [address] [place], [place] [address]    Report Status PENDING  Incomplete  Urine Culture     Status: Abnormal   Collection Time: 11/14/22  5:39 PM   Specimen: Urine, Clean Catch  Result Value Ref Range Status   Specimen Description URINE, CLEAN CATCH  Final   Special Requests   Final    NONE Performed at [facility], [address], [place], [place] [address]     Culture >=100,000 COLONIES/mL ESCHERICHIA COLI (A)  Final   Report Status 11/17/2022 FINAL  Final   Organism ID, Bacteria ESCHERICHIA COLI (A)  Final      Susceptibility   Escherichia coli - MIC*    AMPICILLIN >=32 RESISTANT Resistant     CEFAZOLIN <=4 SENSITIVE Sensitive     CEFEPIME <=0.12 SENSITIVE Sensitive     CEFTRIAXONE <=0.25 SENSITIVE Sensitive     CIPROFLOXACIN <=0.25 SENSITIVE Sensitive     GENTAMICIN <=1 SENSITIVE Sensitive     IMIPENEM <=0.25 SENSITIVE Sensitive     NITROFURANTOIN <=16 SENSITIVE Sensitive     TRIMETH/SULFA >=320 RESISTANT Resistant     AMPICILLIN/SULBACTAM 4 SENSITIVE Sensitive     PIP/TAZO <=4 SENSITIVE Sensitive     * >=100,000 COLONIES/mL ESCHERICHIA COLI    Labs: CBC: Recent Labs  Lab 11/14/22 1339 11/15/22 0149 11/16/22 0105 11/17/22 0043  WBC 11.1* 15.5* 14.3* 8.0  NEUTROABS 9.6*  --  11.7*  --   HGB 15.4 13.8 12.9* 12.6*  HCT 50.4 44.0 41.5  40.7  MCV 92.8 91.1 88.5 88.3  PLT 151 143* 124* 143*   Basic Metabolic Panel: Recent Labs  Lab 11/14/22 1339 11/15/22 0149 11/16/22 0105 11/17/22 0043  NA 137 136 138 139  K 4.1 3.3* 4.0 3.8  CL 107 105 108 109  CO2 21* 18* 23 23  GLUCOSE 126* 146* 120* 117*  BUN 10 14 16 18   CREATININE 1.59* 1.82* 1.55* 1.56*  CALCIUM 9.3 8.9 9.0 8.9   Liver Function Tests: Recent Labs  Lab 11/14/22 1339 11/16/22 0105 11/17/22 0043  AST 21 16 17   ALT 17 17 17   ALKPHOS 95 74 79  BILITOT 1.8* 0.8 0.5  PROT 6.9 5.8* 6.0*  ALBUMIN 4.0 2.9* 3.0*   CBG: Recent Labs  Lab 11/16/22 0603 11/16/22 1137 11/16/22 1702 11/16/22 2131 11/17/22 0624  GLUCAP 94 113* 129* 109* 90    Discharge time spent: greater than 30 minutes.  Signed: [name], MD [facility] 11/17/2022

## 2022-11-17 NOTE — Progress Notes (Signed)
Pt has orders to be discharged. Discharge instructions given and pt has no additional questions at this time. Medication regimen reviewed and pt educated. Pt verbalized understanding and has no additional questions. IV removed and site in good condition. Pt stable and getting dressed while waiting for PO abx.

## 2022-11-17 NOTE — TOC Transition Note (Signed)
Transition of Care [facility]) - CM/SW Discharge Note   Patient Details  Name: [name] MRN: [id number] Date of Birth: 10/07/1960  Transition of Care [facility]) CM/SW Contact:  Leone Haven, RN Phone Number: 11/17/2022, 9:36 AM   Clinical Narrative:    Patient is for dc today, he is set up with outpatient PT on [facility].  He has transportation to go home.          Patient Goals and CMS Choice      Discharge Placement                         Discharge Plan and Services Additional resources added to the After Visit Summary for                                       Social Determinants of Health (SDOH) Interventions SDOH Screenings   Tobacco Use: Low Risk  (06/12/2022)     Readmission Risk Interventions     No data to display

## 2022-11-18 LAB — CULTURE, BLOOD (ROUTINE X 2): Special Requests: ADEQUATE

## 2022-11-19 LAB — CULTURE, BLOOD (ROUTINE X 2)

## 2022-12-01 ENCOUNTER — Encounter (HOSPITAL_BASED_OUTPATIENT_CLINIC_OR_DEPARTMENT_OTHER): Payer: Self-pay | Admitting: Emergency Medicine

## 2022-12-01 ENCOUNTER — Other Ambulatory Visit: Payer: Self-pay

## 2022-12-01 ENCOUNTER — Emergency Department (HOSPITAL_BASED_OUTPATIENT_CLINIC_OR_DEPARTMENT_OTHER)
Admission: EM | Admit: 2022-12-01 | Discharge: 2022-12-02 | Disposition: A | Discharge status: Home / Self Care | Payer: Medicare Other | Attending: Emergency Medicine | Admitting: Emergency Medicine

## 2022-12-01 DIAGNOSIS — N3000 Acute cystitis without hematuria: Secondary | ICD-10-CM | POA: Diagnosis not present

## 2022-12-01 DIAGNOSIS — R339 Retention of urine, unspecified: Secondary | ICD-10-CM | POA: Diagnosis present

## 2022-12-01 LAB — CBC WITH DIFFERENTIAL/PLATELET
Abs Immature Granulocytes: 0.02 10*3/uL (ref 0.00–0.07)
Basophils Absolute: 0 10*3/uL (ref 0.0–0.1)
Basophils Relative: 1 %
Eosinophils Absolute: 0.1 10*3/uL (ref 0.0–0.5)
Eosinophils Relative: 2 %
HCT: 43 % (ref 39.0–52.0)
Hemoglobin: 13.4 g/dL (ref 13.0–17.0)
Immature Granulocytes: 0 %
Lymphocytes Relative: 14 %
Lymphs Abs: 0.9 10*3/uL (ref 0.7–4.0)
MCH: 28 pg (ref 26.0–34.0)
MCHC: 31.2 g/dL (ref 30.0–36.0)
MCV: 90 fL (ref 80.0–100.0)
Monocytes Absolute: 1.2 10*3/uL — ABNORMAL HIGH (ref 0.1–1.0)
Monocytes Relative: 20 %
Neutro Abs: 3.9 10*3/uL (ref 1.7–7.7)
Neutrophils Relative %: 63 %
Platelets: 172 10*3/uL (ref 150–400)
RBC: 4.78 MIL/uL (ref 4.22–5.81)
RDW: 15.2 % (ref 11.5–15.5)
WBC: 6.2 10*3/uL (ref 4.0–10.5)
nRBC: 0 % (ref 0.0–0.2)

## 2022-12-01 LAB — URINALYSIS, ROUTINE W REFLEX MICROSCOPIC
Bilirubin Urine: NEGATIVE
Glucose, UA: NEGATIVE mg/dL
Ketones, ur: NEGATIVE mg/dL
Nitrite: NEGATIVE
Protein, ur: 30 mg/dL — AB
RBC / HPF: >50 RBC/hpf (ref 0–5)
Specific Gravity, Urine: 1.022 (ref 1.005–1.030)
Trans Epithel, UA: 1
WBC, UA: >50 WBC/hpf (ref 0–5)
pH: 6 (ref 5.0–8.0)

## 2022-12-01 NOTE — ED Provider Notes (Incomplete)
[facility] AT [facility] Provider Note   CSN: [id number] Arrival date & time: 12/01/22  2003     History {Add pertinent medical, surgical, social history, OB history to HPI:1} Chief Complaint  Patient presents with  . Urinary Frequency    KYLEN FARRAND is a 62 y.o. male with PMHx deceased donor renal transplant 2 years ago, HTN, HLD, and DM who presents emergency department complaining of urinary frequency, dysuria, and malodorous urine x3 days. Patient d/c from inpatient admission on 11/17/22 for pyelonephritis. Endorses compliance with discharged ABX that ended last Friday. Also endorses mild testicular discomfort.  Denies fever, chest pain, dyspnea, abdominal pain.   Urinary Frequency       Home Medications Prior to Admission medications   Medication Sig Start Date End Date Taking? Authorizing Provider  acetaminophen (TYLENOL) 500 MG tablet Take 1,000 mg by mouth every 6 (six) hours as needed for moderate pain.    [provider]  amLODipine (NORVASC) 10 MG tablet Take 10 mg by mouth daily.    [provider]  atorvastatin (LIPITOR) 80 MG tablet Take 1 tablet by mouth daily. Take one tablet on Monday, Wednesday, and Friday. 04/11/21 05/10/23  [provider]  metoprolol tartrate (LOPRESSOR) 25 MG tablet Take 25 mg by mouth 2 (two) times daily.    [provider]  mycophenolate (MYFORTIC) 180 MG EC tablet Take 360 mg by mouth 2 (two) times daily.    [provider]  predniSONE (DELTASONE) 5 MG tablet Take 5 mg by mouth daily with breakfast.    [provider]  sulfamethoxazole-trimethoprim (BACTRIM) 400-80 MG tablet Take 1 tablet by mouth 3 (three) times a week. Take one on  Monday, Wednesday, and Friday.    [provider]  tacrolimus (PROGRAF) 0.5 MG capsule Take 0.5 mg by mouth in the morning.    [provider]  tacrolimus (PROGRAF) 1 MG capsule Take 1 mg by mouth 2 (two)  times daily.    [provider]  diltiazem (CARDIZEM CD) 180 MG 24 hr capsule Take 1 capsule (180 mg total) by mouth daily. Patient not taking: No sig reported 04/24/19 01/02/21  Terrilee Files, MD      Allergies    Gabapentin and Penicillins    Review of Systems   Review of Systems  Genitourinary:  Positive for frequency.    Physical Exam Updated Vital Signs BP 109/80   Pulse 94   Temp 98.5 F (36.9 C) (Oral)   Resp 18   SpO2 98%  Physical Exam  ED Results / Procedures / Treatments   Labs (all labs ordered are listed, but only abnormal results are displayed) Labs Reviewed  URINALYSIS, ROUTINE W REFLEX MICROSCOPIC - Abnormal; Notable for the following components:      Result Value   APPearance HAZY (*)    Hgb urine dipstick LARGE (*)    Protein, ur 30 (*)    Leukocytes,Ua LARGE (*)    Bacteria, UA MANY (*)    All other components within normal limits    EKG None  Radiology No results found.  Procedures Procedures  {Document cardiac monitor, telemetry assessment procedure when appropriate:1}  Medications Ordered in ED Medications - No data to display  ED Course/ Medical Decision Making/ A&P   {   Click here for ABCD2, HEART and other calculatorsREFRESH Note before signing :1}  Medical Decision Making Amount and/or Complexity of Data Reviewed Labs: ordered.    This patient presents to the ED for concern of dysuria, urinary frequency, this involves an extensive number of treatment options, and is a complaint that carries with it a high risk of complications and morbidity.  The differential diagnosis includes UTI, pyelonephritis, renal transplant complication   Co morbidities that complicate the patient evaluation  Renal transplant 2 years ago     Lab Tests:  I Ordered, and personally interpreted labs.  The pertinent results include:   CBC with differential: pending BMP: pending UA: largely concerning for  UTI   Imaging Studies ordered:  I ordered imaging studies including ***  ***CT Abd/Pelvis with contrast: evaluate for structural/surgical etiology of patients' severe abdominal pain.  ***CT Renal Study: due to favored nephrolithiasis over GI etiology for patient's abdominal pain  ***Testicular ***Pelvic US to evaluate for torsion  I independently visualized and interpreted imaging I agree with the radiologist interpretation   Cardiac Monitoring: / EKG:  The patient was maintained on a cardiac monitor.  I personally viewed and interpreted the cardiac monitored which showed an underlying rhythm of: ***   Consultations Obtained:  I requested consultation with the ***,  and discussed lab and imaging findings as well as pertinent plan - they recommend: ***   Problem List / ED Course / Critical interventions / Medication management  Patient presented for UTI complaints. On exam patient was without tenderness to palpation of abdomen. UA concerning for UTI. Given patient's history of renal transplant - I believe he needs hospital admission at this time. Hx renal transplant - UA concerning for UTI. Patient D/C'd from inpatient admission 5/31 for pyelonephritis. Endorses finishing course of ABX and resolution of symptoms. Endorses compliance on all renal transplant medicines. Dysuria, urinary frequency and malodorous urine started 3 days ago. Physical exam unremarkable - no CVA tenderness or tenderness to palpation. CBC and CMP pending. Can get blood cultures. Cefepime for ABX coverage. I have reviewed the patients home medicines and have made adjustments as needed If BMP - cipro; call nephrology office on Monday. Dr. Allena Katz Patient was given return precautions. Patient stable for discharge at this time.  Patient verbalized understanding of plan.     Social Determinants of Health:  none     {Document critical care time when appropriate:1} {Document review of labs and clinical decision  tools ie heart score, Chads2Vasc2 etc:1}  {Document your independent review of radiology images, and any outside records:1} {Document your discussion with family members, caretakers, and with consultants:1} {Document social determinants of health affecting pt's care:1} {Document your decision making why or why not admission, treatments were needed:1} Final Clinical Impression(s) / ED Diagnoses Final diagnoses:  None    Rx / DC Orders ED Discharge Orders     None

## 2022-12-01 NOTE — ED Triage Notes (Addendum)
Pt presents to ED POV. Pt c/o urinary frequency, pain w/ urination, and malodorous urine for a couple days. Pt reports that he was d/c 5/31 for UTI. Hx of kidney transplant.

## 2022-12-01 NOTE — ED Provider Notes (Signed)
[place] EMERGENCY DEPARTMENT AT [facility] Provider Note   CSN: [id number] Arrival date & time: 12/01/22  2003     History  Chief Complaint  Patient presents with   Urinary Frequency    [name] is a 62 y.o. male with PMHx deceased donor renal transplant 2 years ago, HTN, HLD, and DM who presents emergency department complaining of urinary frequency, dysuria, and malodorous urine x3 days. Patient d/c from inpatient admission on 11/17/22 for pyelonephritis. Endorses compliance with discharged ABX that ended last Friday. Denies any other symptoms.  Denies fever, chest pain, dyspnea, abdominal/flank pain, nausea, vomiting, diarrhea.   Urinary Frequency       Home Medications Prior to Admission medications   Medication Sig Start Date End Date Taking? Authorizing Provider  ciprofloxacin (CIPRO) 500 MG tablet Take 1 tablet (500 mg total) by mouth every 12 (twelve) hours for 5 days. 12/02/22 12/07/22 Yes Valrie Hart F, PA-C  acetaminophen (TYLENOL) 500 MG tablet Take 1,000 mg by mouth every 6 (six) hours as needed for moderate pain.    [provider]  amLODipine (NORVASC) 10 MG tablet Take 10 mg by mouth daily.    [provider]  atorvastatin (LIPITOR) 80 MG tablet Take 1 tablet by mouth daily. Take one tablet on Monday, Wednesday, and Friday. 04/11/21 05/10/23  [provider]  metoprolol tartrate (LOPRESSOR) 25 MG tablet Take 25 mg by mouth 2 (two) times daily.    [provider]  mycophenolate (MYFORTIC) 180 MG EC tablet Take 360 mg by mouth 2 (two) times daily.    [provider]  predniSONE (DELTASONE) 5 MG tablet Take 5 mg by mouth daily with breakfast.    [provider]  sulfamethoxazole-trimethoprim (BACTRIM) 400-80 MG tablet Take 1 tablet by mouth 3 (three) times a week. Take one on  Monday, Wednesday, and Friday.    [provider]  tacrolimus (PROGRAF) 0.5 MG capsule Take 0.5 mg by mouth  in the morning.    [provider]  tacrolimus (PROGRAF) 1 MG capsule Take 1 mg by mouth 2 (two) times daily.    [provider]  diltiazem (CARDIZEM CD) 180 MG 24 hr capsule Take 1 capsule (180 mg total) by mouth daily. Patient not taking: No sig reported 04/24/19 01/02/21  Terrilee Files, MD      Allergies    Gabapentin and Penicillins    Review of Systems   Review of Systems  Genitourinary:  Positive for frequency.    Physical Exam Updated Vital Signs BP 116/83 (BP Location: Right Arm)   Pulse 99   Temp 98.9 F (37.2 C) (Oral)   Resp 15   SpO2 98%  Physical Exam Vitals and nursing note reviewed.  Constitutional:      General: He is not in acute distress.    Appearance: He is not ill-appearing or toxic-appearing.  HENT:     Head: Normocephalic and atraumatic.     Mouth/Throat:     Mouth: Mucous membranes are moist.     Pharynx: No oropharyngeal exudate or posterior oropharyngeal erythema.  Eyes:     General: No scleral icterus.       Right eye: No discharge.        Left eye: No discharge.     Conjunctiva/sclera: Conjunctivae normal.  Cardiovascular:     Rate and Rhythm: Normal rate and regular rhythm.     Pulses: Normal pulses.     Heart sounds: Normal heart sounds. No murmur  heard. Pulmonary:     Effort: Pulmonary effort is normal. No respiratory distress.     Breath sounds: Normal breath sounds. No wheezing, rhonchi or rales.  Abdominal:     General: Abdomen is flat. Bowel sounds are normal. There is no distension.     Palpations: Abdomen is soft. There is no mass.     Tenderness: There is no abdominal tenderness.  Musculoskeletal:     Right lower leg: No edema.     Left lower leg: No edema.  Skin:    General: Skin is warm and dry.     Findings: No rash.  Neurological:     General: No focal deficit present.     Mental Status: He is alert. Mental status is at baseline.  Psychiatric:        Mood and Affect: Mood normal.     ED Results  / Procedures / Treatments   Labs (all labs ordered are listed, but only abnormal results are displayed) Labs Reviewed  URINALYSIS, ROUTINE W REFLEX MICROSCOPIC - Abnormal; Notable for the following components:      Result Value   APPearance HAZY (*)    Hgb urine dipstick LARGE (*)    Protein, ur 30 (*)    Leukocytes,Ua LARGE (*)    Bacteria, UA MANY (*)    All other components within normal limits  BASIC METABOLIC PANEL - Abnormal; Notable for the following components:   Glucose, Bld 106 (*)    Creatinine, Ser 1.72 (*)    GFR, Estimated 44 (*)    All other components within normal limits  CBC WITH DIFFERENTIAL/PLATELET - Abnormal; Notable for the following components:   Monocytes Absolute 1.2 (*)    All other components within normal limits  URINE CULTURE    EKG None  Radiology No results found.  Procedures Procedures    Medications Ordered in ED Medications  ceFEPIme (MAXIPIME) 2 g in sodium chloride 0.9 % 100 mL IVPB (2 g Intravenous New Bag/Given 12/02/22 0036)  sodium chloride 0.9 % bolus 500 mL (has no administration in time range)    ED Course/ Medical Decision Making/ A&P                             Medical Decision Making Amount and/or Complexity of Data Reviewed Labs: ordered.    This patient presents to the ED for concern of dysuria, urinary frequency, this involves an extensive number of treatment options, and is a complaint that carries with it a high risk of complications and morbidity.  The differential diagnosis includes UTI, pyelonephritis, renal transplant complication   Co morbidities that complicate the patient evaluation  Renal transplant 2 years ago     Lab Tests:  I Ordered, and personally interpreted labs.  The pertinent results include:   CBC with differential: No concern for anemia or leukocytosis BMP: no concern for electrolyte abnormality; Cr elevated which seems baseline for patient UA: largely concerning for UTI Urine  culture: pending     Consultations Obtained:  I requested consultation with Nephrologist Dr. Allena Katz,  and discussed lab and imaging findings as well as pertinent plan - they recommend: discharge patient on renal-adjusted Cipro if renal function tests are appropriate and if patient can tolerate PO fluids. Patient will need to follow up with Nephrology on Monday do reassess pending urine cultures.   Problem List / ED Course / Critical interventions / Medication management  Patient presented for UTI  complaints. Hx renal transplant 2 years ago - UA concerning for UTI. Patient D/C'd from inpatient admission 5/31 for pyelonephritis. Endorses finishing course of ABX on 6/7 and resolution of symptoms. Endorses compliance on all renal transplant medicines.  Dysuria, urinary frequency and malodorous urine started 3 days ago. Physical exam unremarkable - no CVA tenderness or tenderness to palpation. Patient afebrile with stable vitals and overall well-appearing. CBC without leukocytosis. BMP with Cr (1.72) which is around baseline for patient. Consulted with Dr. Allena Katz from Nephrology who recommended obtaining urine culture and discharging patient on Cipro as long as renal function is appropriate, vitals are stable, and patient can tolerate PO intake. Giving Cefepime for ABX coverage in ED and discharging on Cipro. Also giving patient IV fluids. Patient able to tolerate PO intake in ED. Shared plan with patient who verbally agreed and endorsed understanding.  I have reviewed the patients home medicines and have made adjustments as needed Patient was given return precautions. Patient stable for discharge at this time.  Patient verbalized understanding of plan.     Social Determinants of Health:  none           Final Clinical Impression(s) / ED Diagnoses Final diagnoses:  Acute cystitis without hematuria    Rx / DC Orders ED Discharge Orders          Ordered    ciprofloxacin (CIPRO) 500  MG tablet  Every 12 hours        12/02/22 0037              Dorthy Cooler, PA-C 12/02/22 0046    Vanetta Mulders, MD 12/04/22 1217

## 2022-12-02 DIAGNOSIS — N3000 Acute cystitis without hematuria: Secondary | ICD-10-CM | POA: Diagnosis not present

## 2022-12-02 LAB — BASIC METABOLIC PANEL
Anion gap: 9 (ref 5–15)
BUN: 17 mg/dL (ref 8–23)
CO2: 24 mmol/L (ref 22–32)
Calcium: 9.5 mg/dL (ref 8.9–10.3)
Chloride: 108 mmol/L (ref 98–111)
Creatinine, Ser: 1.72 mg/dL — ABNORMAL HIGH (ref 0.61–1.24)
GFR, Estimated: 44 mL/min — ABNORMAL LOW (ref >60)
Glucose, Bld: 106 mg/dL — ABNORMAL HIGH (ref 70–99)
Potassium: 3.7 mmol/L (ref 3.5–5.1)
Sodium: 141 mmol/L (ref 135–145)

## 2022-12-02 MED ORDER — CIPROFLOXACIN HCL 500 MG PO TABS: 500.0000 mg | ORAL_TABLET | Freq: Two times a day (BID) | ORAL | 0 refills | Status: AC

## 2022-12-02 MED ORDER — SODIUM CHLORIDE 0.9 % IV BOLUS
500.0000 mL | Freq: Once | INTRAVENOUS | Status: AC
  Administered 2022-12-02: 500 mL via INTRAVENOUS

## 2022-12-02 MED ORDER — SODIUM CHLORIDE 0.9 % IV SOLN
2.0000 g | Freq: Once | INTRAVENOUS | Status: AC
  Administered 2022-12-02: 2 g via INTRAVENOUS
  Filled 2022-12-02: qty 12.5

## 2022-12-02 NOTE — Discharge Instructions (Signed)
Was a pleasure caring for you today.  Urinalysis was concerning for UTI.  You are given antibiotics here in the emergency room and I am also discharging you with antibiotics.  Take entire course of antibiotics even if symptoms resolve.  Please also follow-up with your nephrologist on Monday to follow-up on urine cultures and make sure that your UTI is not resistant to your antibiotic.  Seek emergency care if experiencing any new or worsening symptoms such as severe abdominal pain or severe fever.

## 2022-12-03 LAB — URINE CULTURE: Culture: 30000 — AB

## 2022-12-04 LAB — URINE CULTURE

## 2022-12-05 ENCOUNTER — Telehealth (HOSPITAL_BASED_OUTPATIENT_CLINIC_OR_DEPARTMENT_OTHER): Payer: Self-pay | Admitting: *Deleted

## 2022-12-05 NOTE — Telephone Encounter (Signed)
Post ED Visit - Positive Culture Follow-up  Culture report reviewed by antimicrobial stewardship pharmacist: [facility] [x]  Andreas Ohm, Pharm.D. []  Celedonio Miyamoto, Pharm.D., BCPS AQ-ID []  Garvin Fila, Pharm.D., BCPS []  Georgina Pillion, [address].D., BCPS []  [place], [address].D., BCPS, AAHIVP []  Estella Husk, Pharm.D., BCPS, AAHIVP []  Lysle Pearl, PharmD, BCPS []  Phillips Climes, PharmD, BCPS []  Agapito Games, PharmD, BCPS []  Verlan Friends, PharmD []  Mervyn Gay, PharmD, BCPS []  Vinnie Level, PharmD  [facility] []  Len Childs, PharmD []  Greer Pickerel, PharmD []  Adalberto Cole, PharmD []  Perlie Gold, Rph []  Lonell Face) Jean Rosenthal, PharmD []  Earl Many, PharmD []  Junita Push, PharmD []  Dorna Leitz, PharmD []  Terrilee Files, PharmD []  Lynann Beaver, PharmD []  Keturah Barre, PharmD []  Loralee Pacas, PharmD []  Bernadene Person, PharmD   Positive urine culture Treated with Cipro, organism sensitive to the same and no further patient follow-up is required at this time.  Virl Axe [facility] 12/05/2022, 7:38 AM

## 2023-01-29 IMAGING — CT CT HEAD W/O CM
3 of 4 series · 16 of 47 positions shown, 19 images · non-contrast
Comparison: 07/25/2012

CLINICAL DATA: Nonspecific dizziness. Missed dialysis because of
dizziness.

EXAM:
CT HEAD WITHOUT CONTRAST
TECHNIQUE: Contiguous axial images were obtained from the base of the skull
through the vertex without intravenous contrast.

[Series 4: head 2.0 h70h · axial · 0.42mm/px · z∈[+1125,+1259]mm · 10 of 75 slices shown, 13 images]
[im 4/75  brain]
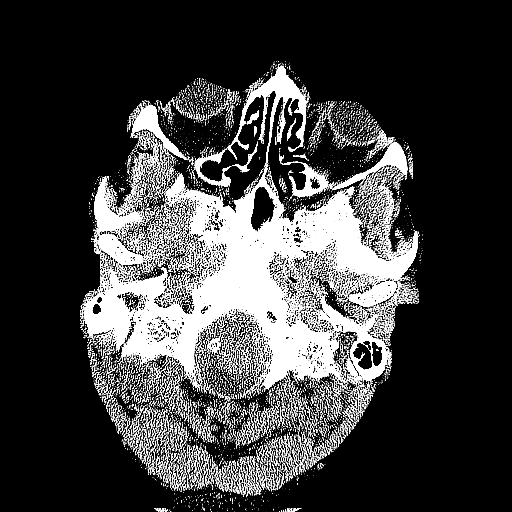
[im 4/75  bone]
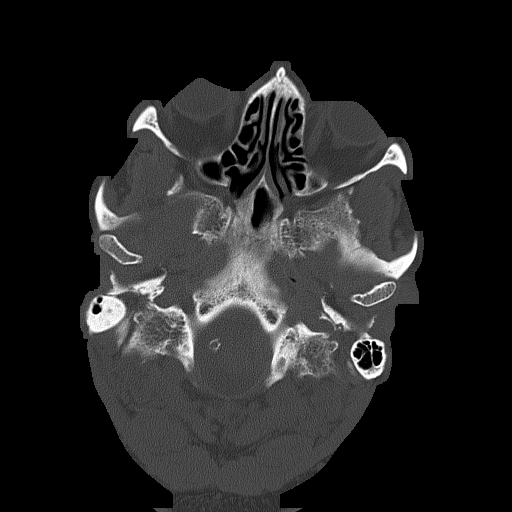
[im 12/75  brain]
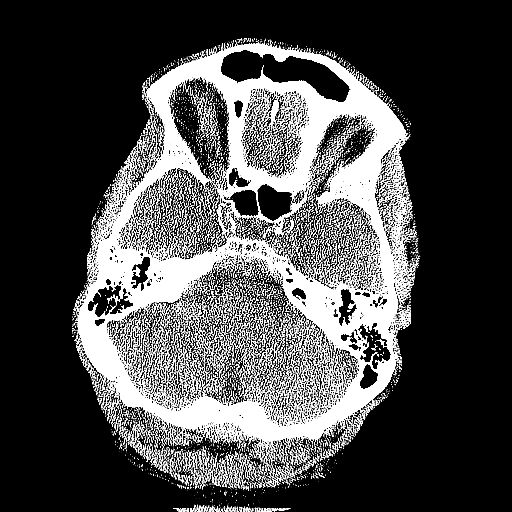
[im 19/75  brain]
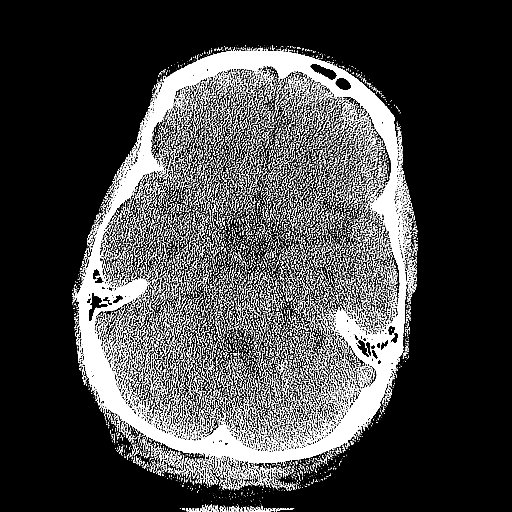
[im 26/75  brain]
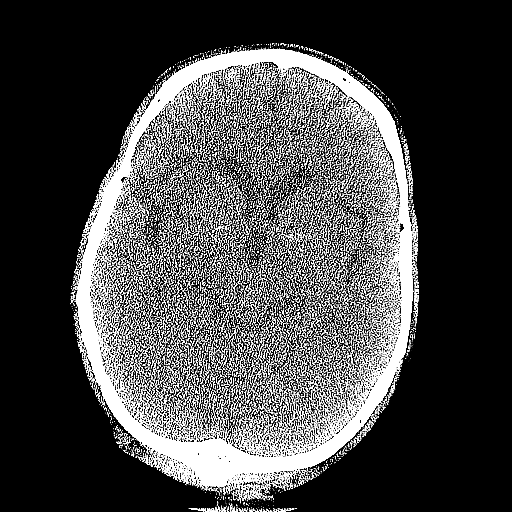
[im 34/75  brain]
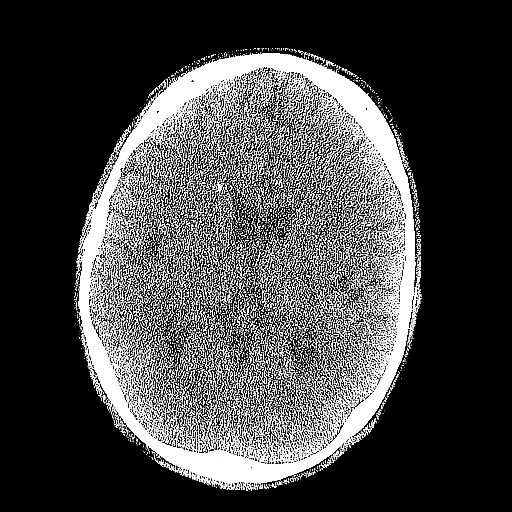
[im 34/75  bone]
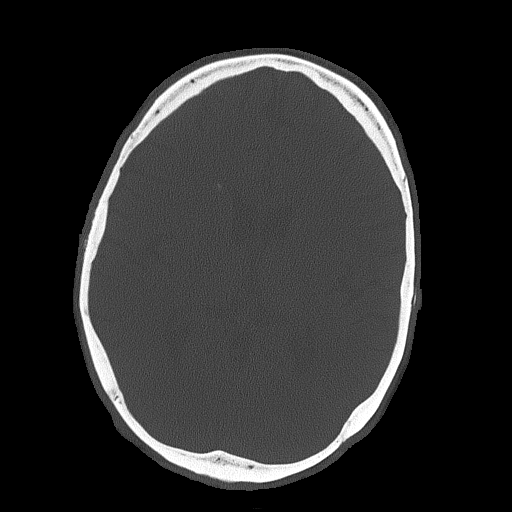
[im 41/75  brain]
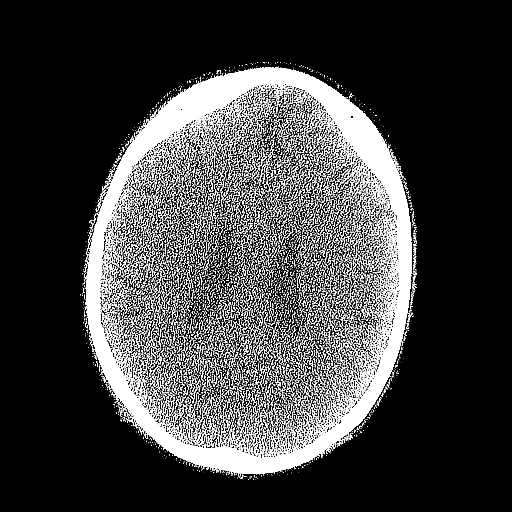
[im 49/75  brain]
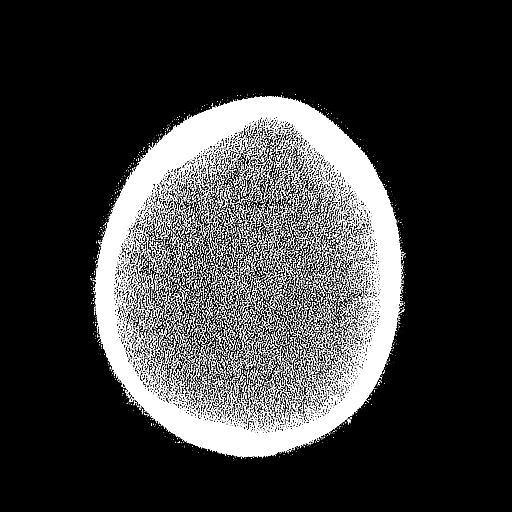
[im 56/75  brain]
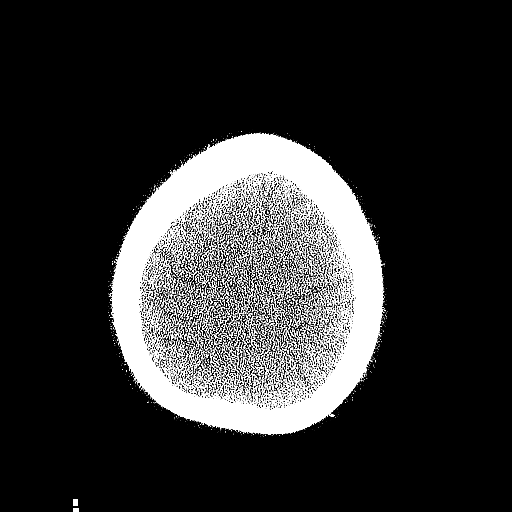
[im 63/75  brain]
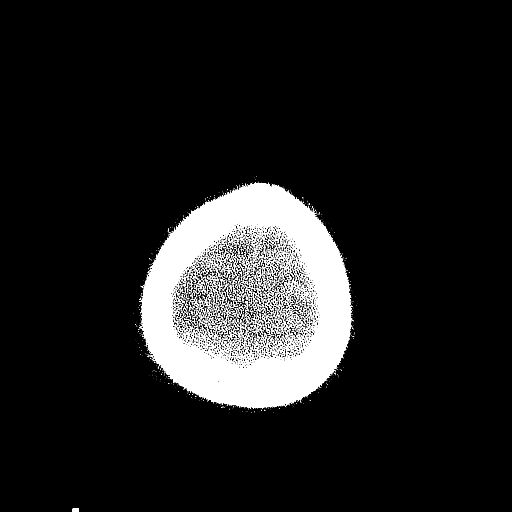
[im 63/75  bone]
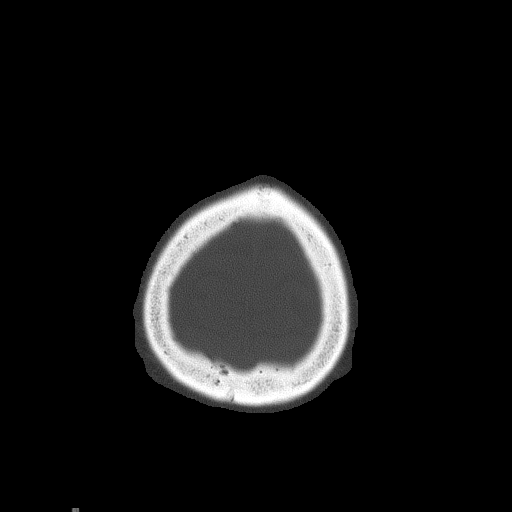
[im 71/75  brain]
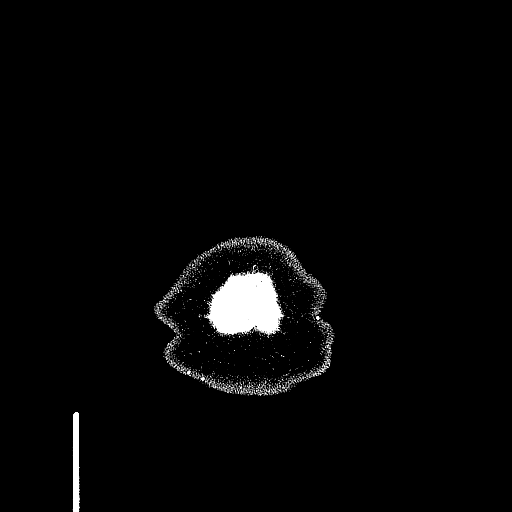

[Series 5: head 3.0 mpr cor · coronal · 0.32mm/px · 3 of 67 slices shown]
[im 23/67  brain]
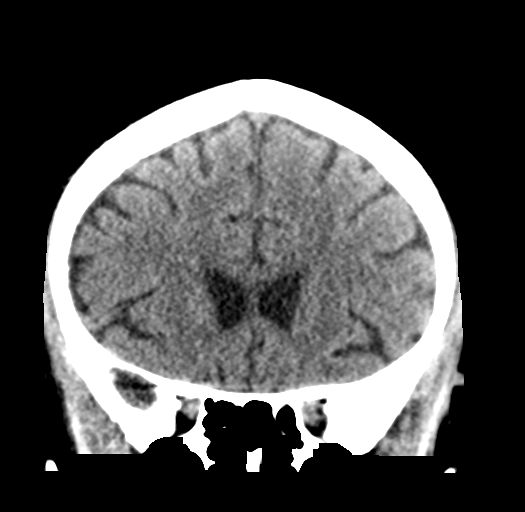
[im 30/67  brain]
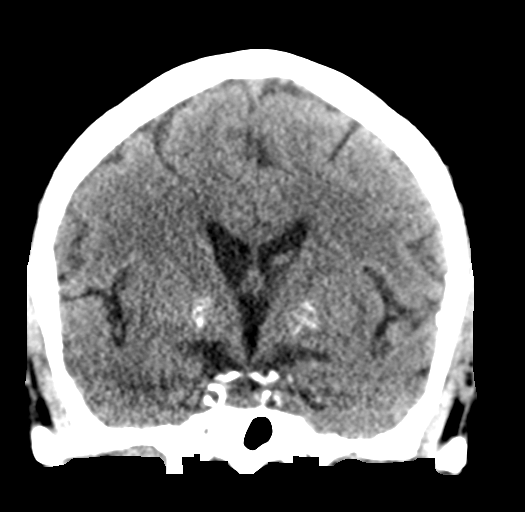
[im 37/67  brain]
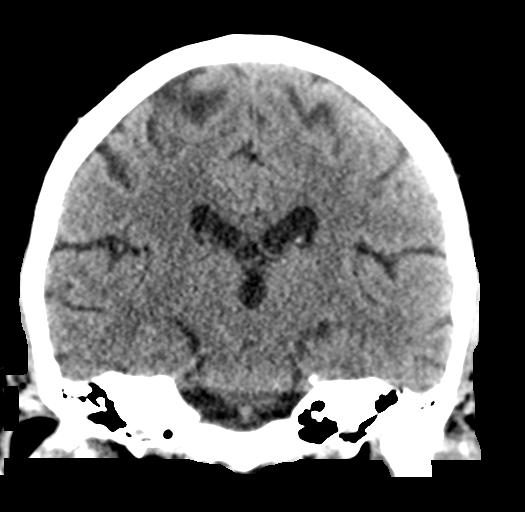

[Series 6: head 3.0 mpr sag · sagittal · 0.32mm/px · 3 of 58 slices shown]
[im 20/58  brain]
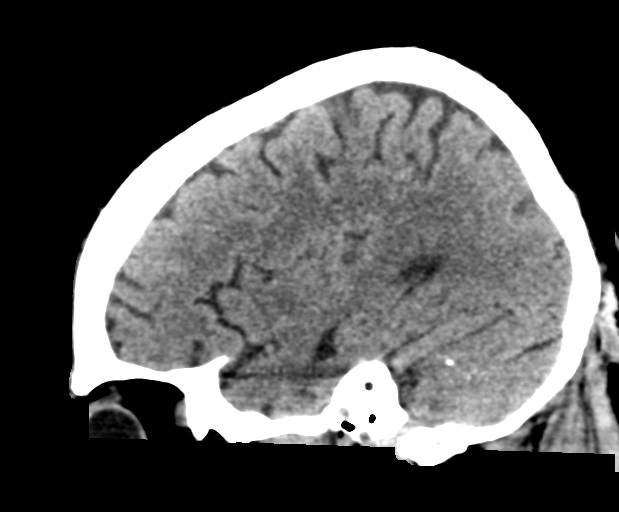
[im 29/58  brain]
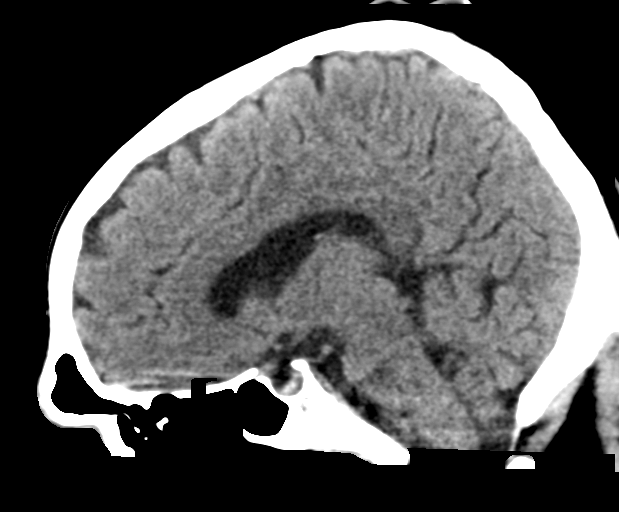
[im 39/58  brain]
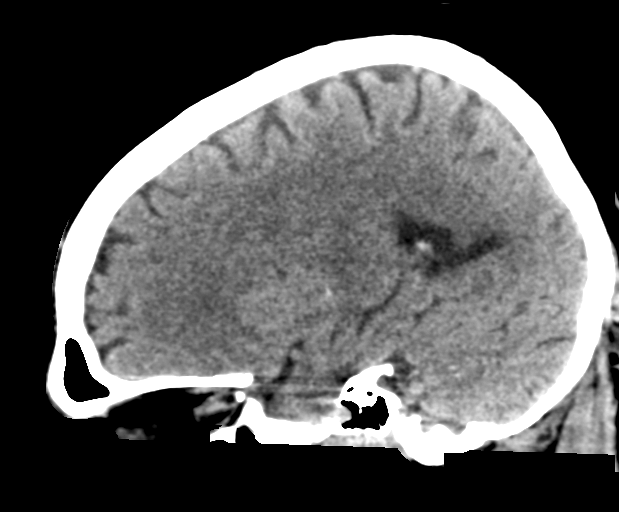

[16 of 47 positions shown; findings below may reference images not displayed]

FINDINGS: Brain: No evidence of acute infarction, hemorrhage, hydrocephalus,
extra-axial collection or mass lesion/mass effect. Diffuse cerebral
atrophy. Low-attenuation changes in the deep white matter consistent
small vessel ischemia. Basal ganglia and deep white matter
calcifications, likely dystrophic.

Vascular: Moderate intracranial arterial vascular calcifications.

Skull: Calvarium appears intact.

Sinuses/Orbits: Paranasal sinuses and mastoid air cells are clear.

Other: No significant changes since prior study.
IMPRESSION: No acute intracranial abnormalities. Chronic atrophy and small
vessel ischemic changes.

## 2023-08-07 ENCOUNTER — Emergency Department (HOSPITAL_COMMUNITY)
Admission: EM | Admit: 2023-08-07 | Discharge: 2023-08-08 | Disposition: A | Discharge status: Home / Self Care | Payer: Medicare Other | Attending: Student | Admitting: Student

## 2023-08-07 ENCOUNTER — Encounter (HOSPITAL_COMMUNITY): Payer: Self-pay

## 2023-08-07 ENCOUNTER — Other Ambulatory Visit: Payer: Self-pay

## 2023-08-07 DIAGNOSIS — Z79899 Other long term (current) drug therapy: Secondary | ICD-10-CM | POA: Diagnosis not present

## 2023-08-07 DIAGNOSIS — U071 COVID-19: Secondary | ICD-10-CM | POA: Diagnosis not present

## 2023-08-07 DIAGNOSIS — K644 Residual hemorrhoidal skin tags: Secondary | ICD-10-CM | POA: Diagnosis not present

## 2023-08-07 DIAGNOSIS — K649 Unspecified hemorrhoids: Secondary | ICD-10-CM

## 2023-08-07 DIAGNOSIS — I120 Hypertensive chronic kidney disease with stage 5 chronic kidney disease or end stage renal disease: Secondary | ICD-10-CM | POA: Insufficient documentation

## 2023-08-07 DIAGNOSIS — E1122 Type 2 diabetes mellitus with diabetic chronic kidney disease: Secondary | ICD-10-CM | POA: Insufficient documentation

## 2023-08-07 DIAGNOSIS — N186 End stage renal disease: Secondary | ICD-10-CM | POA: Insufficient documentation

## 2023-08-07 DIAGNOSIS — Z992 Dependence on renal dialysis: Secondary | ICD-10-CM | POA: Diagnosis not present

## 2023-08-07 DIAGNOSIS — R799 Abnormal finding of blood chemistry, unspecified: Secondary | ICD-10-CM | POA: Diagnosis present

## 2023-08-07 LAB — COMPREHENSIVE METABOLIC PANEL
ALT: 23 U/L (ref 0–44)
AST: 23 U/L (ref 15–41)
Albumin: 3.8 g/dL (ref 3.5–5.0)
Alkaline Phosphatase: 75 U/L (ref 38–126)
Anion gap: 11 (ref 5–15)
BUN: 16 mg/dL (ref 8–23)
CO2: 25 mmol/L (ref 22–32)
Calcium: 9.9 mg/dL (ref 8.9–10.3)
Chloride: 107 mmol/L (ref 98–111)
Creatinine, Ser: 1.54 mg/dL — ABNORMAL HIGH (ref 0.61–1.24)
GFR, Estimated: 50 mL/min — ABNORMAL LOW (ref >60)
Glucose, Bld: 116 mg/dL — ABNORMAL HIGH (ref 70–99)
Potassium: 4.7 mmol/L (ref 3.5–5.1)
Sodium: 143 mmol/L (ref 135–145)
Total Bilirubin: 1.2 mg/dL (ref 0.0–1.2)
Total Protein: 6.7 g/dL (ref 6.5–8.1)

## 2023-08-07 LAB — CBC
HCT: 46.2 % (ref 39.0–52.0)
Hemoglobin: 13.9 g/dL (ref 13.0–17.0)
MCH: 27.6 pg (ref 26.0–34.0)
MCHC: 30.1 g/dL (ref 30.0–36.0)
MCV: 91.8 fL (ref 80.0–100.0)
Platelets: 176 10*3/uL (ref 150–400)
RBC: 5.03 MIL/uL (ref 4.22–5.81)
RDW: 15.2 % (ref 11.5–15.5)
WBC: 6.3 10*3/uL (ref 4.0–10.5)
nRBC: 0 % (ref 0.0–0.2)

## 2023-08-07 LAB — TYPE AND SCREEN
ABO/RH(D): O POS
Antibody Screen: NEGATIVE

## 2023-08-07 NOTE — ED Triage Notes (Addendum)
Pt states went to nephrologist today and was told his hemoglobin is low and told to come here. Pt states he has seen bright red and dark red blood in underwear coming from anusx2d. Pt c/o int SOB. Pt denies weakness. Pt is eupneic at present. Pt denies blood thinners

## 2023-08-07 NOTE — ED Provider Triage Note (Signed)
Emergency Medicine Provider Triage Evaluation Note  [name] , a 63 y.o. male  was evaluated in triage.  Pt complains of low hemoglobin.  Reports dark red bleeding per rectum since Saturday.  Was at his nephrology office today and was told his hemoglobin was low.  Does endorse intermittent shortness of breath and more fatigued than usual.  Denies chest pain.  Denies blood thinner use.  Denies abdominal pain.  Review of Systems  Positive: See above Negative: See above  Physical Exam  BP 106/78   Pulse 72   Temp 98.5 F (36.9 C)   Resp 18   Ht 5\' 11"  (1.803 m)   Wt 121.3 kg   SpO2 100%   BMI 37.30 kg/m  Gen:   Awake, no distress   Resp:  Normal effort  MSK:   Moves extremities without difficulty  Other:  Abdomen is non tender  Medical Decision Making  Medically screening exam initiated at 4:49 PM.  Appropriate orders placed.  [name] was informed that the remainder of the evaluation will be completed by another provider, this initial triage assessment does not replace that evaluation, and the importance of remaining in the ED until their evaluation is complete.  Work up started   Gareth Eagle, PA-C 08/07/23 1652

## 2023-08-08 ENCOUNTER — Emergency Department (HOSPITAL_COMMUNITY): Payer: Medicare Other

## 2023-08-08 DIAGNOSIS — U071 COVID-19: Secondary | ICD-10-CM | POA: Diagnosis not present

## 2023-08-08 LAB — RESP PANEL BY RT-PCR (RSV, FLU A&B, COVID)  RVPGX2
Influenza A by PCR: NEGATIVE
Influenza B by PCR: NEGATIVE
Resp Syncytial Virus by PCR: NEGATIVE
SARS Coronavirus 2 by RT PCR: POSITIVE — AB

## 2023-08-08 MED ORDER — BENZONATATE 100 MG PO CAPS: 100.0000 mg | ORAL_CAPSULE | Freq: Three times a day (TID) | ORAL | 0 refills | Status: AC

## 2023-08-08 MED ORDER — ONDANSETRON 4 MG PO TBDP: 4.0000 mg | ORAL_TABLET | Freq: Three times a day (TID) | ORAL | 0 refills | Status: AC | PRN

## 2023-08-08 MED ORDER — HYDROCORTISONE ACETATE 25 MG RE SUPP: 25.0000 mg | Freq: Two times a day (BID) | RECTAL | 0 refills | Status: AC

## 2023-08-08 MED ORDER — PROMETHAZINE-DM 6.25-15 MG/5ML PO SYRP: 5.0000 mL | ORAL_SOLUTION | Freq: Four times a day (QID) | ORAL | 0 refills | Status: AC | PRN

## 2023-08-08 NOTE — ED Notes (Signed)
Pt verbalized understanding of discharge instructions. Pt ambulatory  at time of discharge. Pt wheeled from ed family to drive home

## 2023-08-08 NOTE — ED Provider Notes (Signed)
[place]ENC[facility] DE[facility]MENT AT [facility] Provider Note  CSN: [id number] Arrival date & time: 08/07/23 1628  Chief Complaint(s) Abnormal Lab  HPI [name] is a 63 y.o. male with PMH ESRD s/p renal transplant, T2DM, GERD, gout who presents emergency room for evaluation of multiple complaints including cough, shortness of breath, fatigue and rectal bleeding.  Patient states that for the last 2 days he is seeing brown stool with streaks of bright red blood on the toilet paper.  Patient reportedly saw his nephrologist today who found the patient to have low hemoglobin and sent him to the emergency department for further evaluation.  States that over the last week he has had upper respiratory symptoms and generalized fatigue.  Denies associated chest pain, abdominal pain, nausea, vomiting, fever or other systemic symptoms.   Past Medical History Past Medical History:  Diagnosis Date   Anemia 10/01/2015   Arthritis    SPINE   Borderline diabetes mellitus    Diabetes mellitus without complication (HCC)    borderline on no meds    Dialysis patient [facility])    Elevated PSA    ESRD on hemodialysis (HCC) 06/04/2020   GERD without esophagitis 01/02/2021   Gout    STABLE  PER PT 10-22-2013   H/O hiatal hernia    Hyperlipidemia    Hypertension    Microhematuria    Nocturia    OSA (obstructive sleep apnea)    PER PT STUDY DONE 2005 (APPROX).  NON- COMPLIANT CPAP   SVT (supraventricular tachycardia) (HCC)    Wears glasses    Patient Active Problem List   Diagnosis Date Noted   Severe sepsis (HCC) 11/14/2022   Renal transplant recipient 11/14/2022   Acute encephalopathy 11/14/2022   Acute pharyngitis 01/02/2021   Type 2 diabetes mellitus without complication, without long-term current use of insulin (HCC) 01/02/2021   GERD without esophagitis 01/02/2021   Pneumonia of left lower lobe due to infectious organism 01/02/2021   GI bleed 06/04/2020   ESRD on hemodialysis (HCC)  06/04/2020   Anxiety 06/04/2020   Complication of vascular access for dialysis 09/23/2019   Pulmonary edema 05/31/2019   Symptomatic anemia    Hemoptysis 10/01/2015   Anemia 10/01/2015   Unintentional weight loss 10/01/2015   OSA (obstructive sleep apnea)    Baker's cyst of knee    Joint effusion, knee    Effusion of right knee 09/21/2014   Epididymitis 09/21/2014   Prostate cancer (HCC) 12/18/2013   Hypokalemia 05/30/2013   Weakness generalized 05/30/2013   Prolonged QT interval 05/30/2013   Other and unspecified hyperlipidemia 05/30/2013   Essential hypertension 05/30/2013   Benign prostatic hyperplasia 05/30/2013   Acute gout 05/30/2013   Home Medication(s) Prior to Admission medications   Medication Sig Start Date End Date Taking? Authorizing Provider  benzonatate (TESSALON) 100 MG capsule Take 1 capsule (100 mg total) by mouth every 8 (eight) hours. 08/08/23  Yes Zaina Jenkin, MD  hydrocortisone (ANUSOL-HC) 25 MG suppository Place 1 suppository (25 mg total) rectally 2 (two) times daily. 08/08/23  Yes Enmanuel Zufall, MD  ondansetron (ZOFRAN-ODT) 4 MG disintegrating tablet Take 1 tablet (4 mg total) by mouth every 8 (eight) hours as needed for nausea or vomiting. 08/08/23  Yes Carie Kapuscinski, MD  promethazine-dextromethorphan (PROMETHAZINE-DM) 6.25-15 MG/5ML syrup Take 5 mLs by mouth 4 (four) times daily as needed for cough. 08/08/23  Yes Lyda Colcord, MD  acetaminophen (TYLENOL) 500 MG tablet Take 1,000 mg by mouth every 6 (six) hours  as needed for moderate pain.    [provider]  amLODipine (NORVASC) 10 MG tablet Take 10 mg by mouth daily.    [provider]  metoprolol tartrate (LOPRESSOR) 25 MG tablet Take 25 mg by mouth 2 (two) times daily.    [provider]  mycophenolate (MYFORTIC) 180 MG EC tablet Take 360 mg by mouth 2 (two) times daily.    [provider]  predniSONE (DELTASONE) 5 MG tablet Take 5 mg by mouth daily with  breakfast.    [provider]  sulfamethoxazole-trimethoprim (BACTRIM) 400-80 MG tablet Take 1 tablet by mouth 3 (three) times a week. Take one on  Monday, Wednesday, and Friday.    [provider]  tacrolimus (PROGRAF) 0.5 MG capsule Take 0.5 mg by mouth in the morning.    [provider]  tacrolimus (PROGRAF) 1 MG capsule Take 1 mg by mouth 2 (two) times daily.    [provider]  diltiazem (CARDIZEM CD) 180 MG 24 hr capsule Take 1 capsule (180 mg total) by mouth daily. Patient not taking: No sig reported 04/24/19 01/02/21  Terrilee Files, MD                                                                                                                                    Past Surgical History Past Surgical History:  Procedure Laterality Date   AV FISTULA PLACEMENT Left 08/17/2015   Procedure: BRACHIOCEPHALIC ARTERIOVENOUS (AV) FISTULA CREATION  left arm;  Surgeon: Fransisco Hertz, MD;  Location: [facility] OR;  Service: Vascular;  Laterality: Left;   CARDIAC CATHETERIZATION  10-04-2006   DR Alanda Amass   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%/  2ndDIAGONAL 30%/  RCA 40%/  EF 60%   CARDIAC CATHETERIZATION  04-07-2002  DR [facility]   NON-CRITICAL CAD/  PRESERVED LV   CYSTOSCOPY W/ RETROGRADES Bilateral 10/27/2013   Procedure: CYSTOSCOPY WITH RETROGRADE PYELOGRAM;  Surgeon: Milford Cage, MD;  Location: [facility];  Service: Urology;  Laterality: Bilateral;   LYMPHADENECTOMY Bilateral 12/18/2013   Procedure: PELVIC LYMPH NODE DISSECTION;  Surgeon: Sebastian Ache, MD;  Location: [facility] ORS;  Service: Urology;  Laterality: Bilateral;   PROSTATE BIOPSY N/A 10/27/2013   Procedure: BIOPSY TRANSRECTAL ULTRASONIC PROSTATE (TUBP);  Surgeon: Milford Cage, MD;  Location: [facility];  Service: Urology;  Laterality: N/A;   PROSTATE SURGERY     RIGHT HAND SURGERY  YRS AGO   ROBOT ASSISTED LAPAROSCOPIC RADICAL PROSTATECTOMY N/A 12/18/2013   Procedure:  ROBOTIC ASSISTED LAPAROSCOPIC RADICAL PROSTATECTOMY AND INDOCYANINE GREEN DYE;  Surgeon: Sebastian Ache, MD;  Location: [facility] ORS;  Service: Urology;  Laterality: N/A;   TRANSTHORACIC ECHOCARDIOGRAM  07-03-2006   MILD - MODERATE LVH/  EF 55-60%/  GRADE I DIASTOIC DYSFUNCTION/  TRIVIAL  TR /  TRIVIAL PERICARDIAL EFFUSION   Family History Family History  Problem Relation Age of Onset   Other Mother  shot and killed   Cirrhosis Father     Social History Social History   Tobacco Use   Smoking status: Never   Smokeless tobacco: Never  Vaping Use   Vaping status: Never Used  Substance Use Topics   Alcohol use: No   Drug use: No   Allergies Gabapentin and Penicillins  Review of Systems Review of Systems  Constitutional:  Positive for fatigue.  Respiratory:  Positive for cough.   Gastrointestinal:  Positive for blood in stool.    Physical Exam Vital Signs  I have reviewed the triage vital signs BP 122/86 (BP Location: Right Arm)   Pulse 94   Temp 99.2 F (37.3 C) (Oral)   Resp 18   Ht 5\' 11"  (1.803 m)   Wt 121.3 kg   SpO2 99%   BMI 37.30 kg/m   Physical Exam Constitutional:      General: He is not in acute distress.    Appearance: Normal appearance.  HENT:     Head: Normocephalic and atraumatic.     Nose: No congestion or rhinorrhea.  Eyes:     General:        Right eye: No discharge.        Left eye: No discharge.     Extraocular Movements: Extraocular movements intact.     Pupils: Pupils are equal, round, and reactive to light.  Cardiovascular:     Rate and Rhythm: Normal rate and regular rhythm.     Heart sounds: No murmur heard. Pulmonary:     Effort: No respiratory distress.     Breath sounds: No wheezing or rales.  Abdominal:     General: There is no distension.     Tenderness: There is no abdominal tenderness.  Genitourinary:    Comments: External hemorrhoid Musculoskeletal:        General: Normal range of motion.     Cervical back: Normal  range of motion.  Skin:    General: Skin is warm and dry.  Neurological:     General: No focal deficit present.     Mental Status: He is alert.     ED Results and Treatments Labs (all labs ordered are listed, but only abnormal results are displayed) Labs Reviewed  RESP PANEL BY RT-PCR (RSV, FLU A&B, COVID)  RVPGX2 - Abnormal; Notable for the following components:      Result Value   SARS Coronavirus 2 by RT PCR POSITIVE (*)    All other components within normal limits  COMPREHENSIVE METABOLIC PANEL - Abnormal; Notable for the following components:   Glucose, Bld 116 (*)    Creatinine, Ser 1.54 (*)    GFR, Estimated 50 (*)    All other components within normal limits  CBC  TYPE AND SCREEN                                                                                                                          Radiology DG Chest 2  View Result Date: 08/08/2023 CLINICAL DATA:  Dyspnea EXAM: CHEST - 2 VIEW COMPARISON:  06/12/2022 FINDINGS: The heart size and mediastinal contours are within normal limits. Both lungs are clear. The visualized skeletal structures are unremarkable. IMPRESSION: No active cardiopulmonary disease. Electronically Signed   By: Deatra Robinson M.D.   On: 08/08/2023 03:27    Pertinent labs & imaging results that were available during my care of the patient were reviewed by me and considered in my medical decision making (see MDM for details).  Medications Ordered in ED Medications - No data to display                                                                                                                                   Procedures Procedures  (including critical care time)  Medical Decision Making / ED Course   This patient presents to the ED for concern of fatigue, cough, rectal bleeding, this involves an extensive number of treatment options, and is a complaint that carries with it a high risk of complications and morbidity.  The differential  diagnosis includes IBD flare, enteritis, infectious diarrhea, abdominal malignancy, anal fissure, internal hemorrhoid, external hemorrhoid, diverticular bleed, hemangiodysplasia, retained foreign body, COVID-19, influenza, RSV, pneumonia, transplant rejection  MDM: Patient seen emergency room for evaluation of multiple complaints.  Physical exam largely unremarkable outside of a bleeding external hemorrhoid.  Laboratory evaluation with a creatinine of 1.54 which is near patient's baseline and only minimally elevated from labs performed on 07/18/2023 where creatinine was 1.23.  Hemoglobin is normal today and I am unsure as to the abnormal labs patient received at his nephrology office.  Chest x-ray is unremarkable.  Patient is COVID-19 positive which is likely the source of his diarrhea and generalized fatigue with shortness of breath.  He is not hypoxic and does not meet inpatient criteria for admission at this time.  Regards to his rectal bleeding this is likely outlet bleeding from repeated irritation of his hemorrhoid from watery diarrhea.  He will be discharged with symptomatic care but at this time he does not meet inpatient criteria for admission.  Return precautions given which she voiced understanding patient discharged with outpatient follow-up   Additional history obtained: -Additional history obtained from daughter -External records from outside source obtained and reviewed including: Chart review including previous notes, labs, imaging, consultation notes   Lab Tests: -I ordered, reviewed, and interpreted labs.   The pertinent results include:   Labs Reviewed  RESP PANEL BY RT-PCR (RSV, FLU A&B, COVID)  RVPGX2 - Abnormal; Notable for the following components:      Result Value   SARS Coronavirus 2 by RT PCR POSITIVE (*)    All other components within normal limits  COMPREHENSIVE METABOLIC PANEL - Abnormal; Notable for the following components:   Glucose, Bld 116 (*)    Creatinine, Ser  1.54 (*)    GFR, Estimated 50 (*)  All other components within normal limits  CBC  TYPE AND SCREEN       Imaging Studies ordered: I ordered imaging studies including chest x-ray I independently visualized and interpreted imaging. I agree with the radiologist interpretation   Medicines ordered and prescription drug management: Meds ordered this encounter  Medications   hydrocortisone (ANUSOL-HC) 25 MG suppository    Sig: Place 1 suppository (25 mg total) rectally 2 (two) times daily.    Dispense:  12 suppository    Refill:  0   benzonatate (TESSALON) 100 MG capsule    Sig: Take 1 capsule (100 mg total) by mouth every 8 (eight) hours.    Dispense:  21 capsule    Refill:  0   promethazine-dextromethorphan (PROMETHAZINE-DM) 6.25-15 MG/5ML syrup    Sig: Take 5 mLs by mouth 4 (four) times daily as needed for cough.    Dispense:  118 mL    Refill:  0   ondansetron (ZOFRAN-ODT) 4 MG disintegrating tablet    Sig: Take 1 tablet (4 mg total) by mouth every 8 (eight) hours as needed for nausea or vomiting.    Dispense:  20 tablet    Refill:  0    -I have reviewed the patients home medicines and have made adjustments as needed  Critical interventions none    Cardiac Monitoring: The patient was maintained on a cardiac monitor.  I personally viewed and interpreted the cardiac monitored which showed an underlying rhythm of: NSR  Social Determinants of Health:  Factors impacting patients care include: none   Reevaluation: After the interventions noted above, I reevaluated the patient and found that they have :improved  Co morbidities that complicate the patient evaluation  Past Medical History:  Diagnosis Date   Anemia 10/01/2015   Arthritis    SPINE   Borderline diabetes mellitus    Diabetes mellitus without complication (HCC)    borderline on no meds    Dialysis patient [facility])    Elevated PSA    ESRD on hemodialysis (HCC) 06/04/2020   GERD without esophagitis  01/02/2021   Gout    STABLE  PER PT 10-22-2013   H/O hiatal hernia    Hyperlipidemia    Hypertension    Microhematuria    Nocturia    OSA (obstructive sleep apnea)    PER PT STUDY DONE 2005 (APPROX).  NON- COMPLIANT CPAP   SVT (supraventricular tachycardia) (HCC)    Wears glasses       Dispostion: I considered admission for this patient, but at this time he does not meet inpatient criteria for admission and will be discharged with outpatient follow-up     Final Clinical Impression(s) / ED Diagnoses Final diagnoses:  COVID-19  Bleeding hemorrhoid     @PCDICTATION @    Glendora Score, MD 08/08/23 1952

## 2024-07-15 ENCOUNTER — Emergency Department (HOSPITAL_BASED_OUTPATIENT_CLINIC_OR_DEPARTMENT_OTHER): Payer: Medicare (Managed Care)

## 2024-07-15 ENCOUNTER — Other Ambulatory Visit: Payer: Self-pay

## 2024-07-15 ENCOUNTER — Emergency Department (HOSPITAL_BASED_OUTPATIENT_CLINIC_OR_DEPARTMENT_OTHER)
Admission: EM | Admit: 2024-07-15 | Discharge: 2024-07-15 | Disposition: A | Discharge status: Home / Self Care | Payer: Medicare (Managed Care) | Source: Ambulatory Visit | Attending: Emergency Medicine | Admitting: Emergency Medicine

## 2024-07-15 DIAGNOSIS — X509XXA Other and unspecified overexertion or strenuous movements or postures, initial encounter: Secondary | ICD-10-CM | POA: Insufficient documentation

## 2024-07-15 DIAGNOSIS — S4992XA Unspecified injury of left shoulder and upper arm, initial encounter: Secondary | ICD-10-CM | POA: Insufficient documentation

## 2024-07-15 DIAGNOSIS — I10 Essential (primary) hypertension: Secondary | ICD-10-CM | POA: Insufficient documentation

## 2024-07-15 DIAGNOSIS — E119 Type 2 diabetes mellitus without complications: Secondary | ICD-10-CM | POA: Diagnosis not present

## 2024-07-15 DIAGNOSIS — M79622 Pain in left upper arm: Secondary | ICD-10-CM | POA: Diagnosis present

## 2024-07-15 DIAGNOSIS — Z79899 Other long term (current) drug therapy: Secondary | ICD-10-CM | POA: Diagnosis not present

## 2024-07-15 LAB — CBC WITH DIFFERENTIAL/PLATELET
Abs Immature Granulocytes: 0.03 10*3/uL (ref 0.00–0.07)
Basophils Absolute: 0 10*3/uL (ref 0.0–0.1)
Basophils Relative: 0 %
Eosinophils Absolute: 0.1 10*3/uL (ref 0.0–0.5)
Eosinophils Relative: 1 %
HCT: 44.7 % (ref 39.0–52.0)
Hemoglobin: 13.9 g/dL (ref 13.0–17.0)
Immature Granulocytes: 0 %
Lymphocytes Relative: 12 %
Lymphs Abs: 0.9 10*3/uL (ref 0.7–4.0)
MCH: 26.9 pg (ref 26.0–34.0)
MCHC: 31.1 g/dL (ref 30.0–36.0)
MCV: 86.5 fL (ref 80.0–100.0)
Monocytes Absolute: 0.9 10*3/uL (ref 0.1–1.0)
Monocytes Relative: 12 %
Neutro Abs: 5.5 10*3/uL (ref 1.7–7.7)
Neutrophils Relative %: 75 %
Platelets: 174 10*3/uL (ref 150–400)
RBC: 5.17 MIL/uL (ref 4.22–5.81)
RDW: 15.5 % (ref 11.5–15.5)
WBC: 7.4 10*3/uL (ref 4.0–10.5)
nRBC: 0 % (ref 0.0–0.2)

## 2024-07-15 LAB — BASIC METABOLIC PANEL WITH GFR
Anion gap: 12 (ref 5–15)
BUN: 11 mg/dL (ref 8–23)
CO2: 25 mmol/L (ref 22–32)
Calcium: 10.1 mg/dL (ref 8.9–10.3)
Chloride: 104 mmol/L (ref 98–111)
Creatinine, Ser: 1.35 mg/dL — ABNORMAL HIGH (ref 0.61–1.24)
GFR, Estimated: 58 mL/min — ABNORMAL LOW
Glucose, Bld: 105 mg/dL — ABNORMAL HIGH (ref 70–99)
Potassium: 3.8 mmol/L (ref 3.5–5.1)
Sodium: 140 mmol/L (ref 135–145)

## 2024-07-15 MED ORDER — DOXYCYCLINE HYCLATE 100 MG PO CAPS: 100.0000 mg | ORAL_CAPSULE | Freq: Two times a day (BID) | ORAL | 0 refills | Status: AC

## 2024-07-15 MED ORDER — HYDROCODONE-ACETAMINOPHEN 5-325 MG PO TABS: 1.0000 | ORAL_TABLET | Freq: Four times a day (QID) | ORAL | 0 refills | Status: AC | PRN

## 2024-07-15 MED ORDER — HYDROCODONE-ACETAMINOPHEN 5-325 MG PO TABS
1.0000 | ORAL_TABLET | Freq: Once | ORAL | Status: AC
  Administered 2024-07-15: 1 via ORAL
  Filled 2024-07-15: qty 1

## 2024-07-15 MED ORDER — CEPHALEXIN 500 MG PO CAPS: 500.0000 mg | ORAL_CAPSULE | Freq: Three times a day (TID) | ORAL | 0 refills | Status: AC

## 2024-07-15 MED ORDER — IOHEXOL 350 MG/ML SOLN
100.0000 mL | Freq: Once | INTRAVENOUS | Status: AC | PRN
  Administered 2024-07-15: 100 mL via INTRAVENOUS

## 2024-07-15 NOTE — ED Notes (Signed)
 Patient transported to CT

## 2024-07-15 NOTE — ED Provider Notes (Signed)
 " [place] EMERGENCY DEPARTMENT AT [facility] Provider Note   CSN: [id number] Arrival date & time: 07/15/24  1222     Patient presents with: Arm Injury   [name] is a 64 y.o. male with a past medical history significant for hyperlipidemia, hypertension, diabetes, previous renal transplant roughly 4 years ago, history of SVT who presents to the ED due to left upper extremity pain.  Patient notes he was walking his dog 3 to 4 days ago when he was pulled and felt a pop in his left upper extremity.  Patient admits to significant edema and pain ever since.  Swelling around the fistula has grown over the past 3 to 4 days.  Also admits to some erythema to area.  No fever or chills.  Patient no longer using fistula due to renal transplant.  History obtained from patient and past medical records. No interpreter used during encounter.      Prior to Admission medications  Medication Sig Start Date End Date Taking? Authorizing Provider  cephALEXin  (KEFLEX ) 500 MG capsule Take 1 capsule (500 mg total) by mouth 3 (three) times daily. 07/15/24  Yes Chasya Zenz C, PA-C  doxycycline  (VIBRAMYCIN ) 100 MG capsule Take 1 capsule (100 mg total) by mouth 2 (two) times daily. 07/15/24  Yes Tijah Hane C, PA-C  HYDROcodone -acetaminophen  (NORCO/VICODIN) 5-325 MG tablet Take 1 tablet by mouth every 6 (six) hours as needed. 07/15/24  Yes Salahuddin Arismendez C, PA-C  acetaminophen  (TYLENOL ) 500 MG tablet Take 1,000 mg by mouth every 6 (six) hours as needed for moderate pain.    [provider]  amLODipine  (NORVASC ) 10 MG tablet Take 10 mg by mouth daily.    [provider]  benzonatate  (TESSALON ) 100 MG capsule Take 1 capsule (100 mg total) by mouth every 8 (eight) hours. 08/08/23   Kommor, Madison, MD  hydrocortisone  (ANUSOL -HC) 25 MG suppository Place 1 suppository (25 mg total) rectally 2 (two) times daily. 08/08/23   Kommor, Madison, MD  metoprolol  tartrate (LOPRESSOR ) 25 MG  tablet Take 25 mg by mouth 2 (two) times daily.    [provider]  mycophenolate  (MYFORTIC ) 180 MG EC tablet Take 360 mg by mouth 2 (two) times daily.    [provider]  ondansetron  (ZOFRAN -ODT) 4 MG disintegrating tablet Take 1 tablet (4 mg total) by mouth every 8 (eight) hours as needed for nausea or vomiting. 08/08/23   Kommor, Madison, MD  predniSONE  (DELTASONE ) 5 MG tablet Take 5 mg by mouth daily with breakfast.    [provider]  promethazine -dextromethorphan (PROMETHAZINE -DM) 6.25-15 MG/5ML syrup Take 5 mLs by mouth 4 (four) times daily as needed for cough. 08/08/23   Kommor, Madison, MD  sulfamethoxazole -trimethoprim  (BACTRIM ) 400-80 MG tablet Take 1 tablet by mouth 3 (three) times a week. Take one on  Monday, Wednesday, and Friday.    [provider]  tacrolimus  (PROGRAF ) 0.5 MG capsule Take 0.5 mg by mouth in the morning.    [provider]  tacrolimus  (PROGRAF ) 1 MG capsule Take 1 mg by mouth 2 (two) times daily.    [provider]  diltiazem  (CARDIZEM  CD) 180 MG 24 hr capsule Take 1 capsule (180 mg total) by mouth daily. Patient not taking: No sig reported 04/24/19 01/02/21  Towana Ozell BROCKS, MD    Allergies: Gabapentin and Penicillins    Review of Systems  Constitutional:  Negative for fever.  Musculoskeletal:  Positive for arthralgias.    Updated Vital Signs BP (!) 132/90 (BP  Location: Right Arm)   Pulse 79   Temp 98.9 F (37.2 C) (Oral)   Resp 18   Ht 5' 11 (1.803 m)   Wt 111.1 kg   SpO2 95%   BMI 34.17 kg/m   Physical Exam Vitals and nursing note reviewed.  Constitutional:      General: He is not in acute distress.    Appearance: He is not ill-appearing.  HENT:     Head: Normocephalic.  Eyes:     Pupils: Pupils are equal, round, and reactive to light.  Cardiovascular:     Rate and Rhythm: Normal rate and regular rhythm.     Pulses: Normal pulses.     Heart sounds: Normal heart sounds. No murmur heard.     No friction rub. No gallop.  Pulmonary:     Effort: Pulmonary effort is normal.     Breath sounds: Normal breath sounds.  Abdominal:     General: Abdomen is flat. There is no distension.     Palpations: Abdomen is soft.     Tenderness: There is no abdominal tenderness. There is no guarding or rebound.  Musculoskeletal:        General: Normal range of motion.     Cervical back: Neck supple.     Comments: Patient able to flex to 45 degree and full extension. Radial pulse intact. Does have surrounding erythema.   Skin:    General: Skin is warm and dry.  Neurological:     General: No focal deficit present.     Mental Status: He is alert.  Psychiatric:        Mood and Affect: Mood normal.        Behavior: Behavior normal.     (all labs ordered are listed, but only abnormal results are displayed) Labs Reviewed  BASIC METABOLIC PANEL WITH GFR - Abnormal; Notable for the following components:      Result Value   Glucose, Bld 105 (*)    Creatinine, Ser 1.35 (*)    GFR, Estimated 58 (*)    All other components within normal limits  CBC WITH DIFFERENTIAL/PLATELET    EKG: None  Radiology: CT ANGIO UP EXTREM LEFT W &/OR WO CONTAST Result Date: 07/15/2024 EXAM: CTA LEFT UPPER EXTREMITY 07/15/2024 05:19:04 PM TECHNIQUE: Contrast-enhanced computed tomography angiography of the upper extremity was performed without and with IV contrast (100 mL iohexol  (OMNIPAQUE ) 350 MG/ML injection) with multiplanar reconstructions. Automated exposure control, iterative reconstruction, and/or weight based adjustment of the mA/kV was utilized to reduce the radiation dose to as low as reasonably achievable. COMPARISON: Left humeral radiograph 07/15/2024. CLINICAL HISTORY: AVM, upper arm. Arteriovenous malformation, upper arm. Injury to the left upper arm 3 to 4 days ago while walking the dog. FINDINGS: ARTERIAL: The native axillary, brachial, radial and ulnar arteries are patent with no stenosis, occlusion,  aneurysm or dissection. A large tortuous vascular structure, consistent with an arteriovenous dialysis fistula, is present in the soft tissues of the anterior upper arm. No flow is demonstrated within the graft component of the fistula, suggesting possible graft occlusion. VENOUS: The native axillary, brachial, cephalic, basilic and median cubital veins are patent with no thrombosis. A large tortuous vascular structure, consistent with an arteriovenous dialysis fistula, is present in the soft tissues of the anterior upper arm. No flow is demonstrated within the graft component of the fistula, suggesting possible graft occlusion. BONES AND SOFT TISSUES: No soft tissue stranding or loculated collection. Visualized muscular structures appear intact. The visualized left  lung is clear. No acute bony abnormalities. IMPRESSION: 1. No acute bony abnormality or soft tissue collection of the left upper arm. 2. Large tortuous vascular structure in the anterior upper arm soft tissues with peripheral calcification, consistent with an arteriovenous dialysis fistula, with no flow demonstrated within the graft consistent with possible graft occlusion. Electronically signed by: Elsie Gravely MD 07/15/2024 05:51 PM EST RP Workstation: [id number]   DG Humerus Left Result Date: 07/15/2024 CLINICAL DATA:  injury with pain EXAM: LEFT HUMERUS - 2+ VIEW; LEFT ELBOW - COMPLETE 3+ VIEW COMPARISON:  March 12, 2013. FINDINGS: No acute fracture or dislocation. Subcortical cyst formation along the humeral head. No area of erosion or osseous destruction. No unexpected radiopaque foreign body. Rounded soft tissue expansion along the arm with peripheral calcifications most consistent with history of fistula. Focal saccular possible dilation spans approximately 4.2 cm radiographically. Vascular calcifications. IMPRESSION: 1. No acute fracture or dislocation. 2. Rounded soft tissue expansion along the arm with peripheral calcifications most  consistent with history of fistula. Focal saccular possible dilation spans approximately 4.2 cm radiographically. Recommend correlation with physical exam. Correlation with prior fistulograms or prior ultrasound could be of assistance in determining whether possible dilation is acute. Electronically Signed   By: Corean Salter M.D.   On: 07/15/2024 14:41   DG Elbow Complete Left Result Date: 07/15/2024 CLINICAL DATA:  injury with pain EXAM: LEFT HUMERUS - 2+ VIEW; LEFT ELBOW - COMPLETE 3+ VIEW COMPARISON:  March 12, 2013. FINDINGS: No acute fracture or dislocation. Subcortical cyst formation along the humeral head. No area of erosion or osseous destruction. No unexpected radiopaque foreign body. Rounded soft tissue expansion along the arm with peripheral calcifications most consistent with history of fistula. Focal saccular possible dilation spans approximately 4.2 cm radiographically. Vascular calcifications. IMPRESSION: 1. No acute fracture or dislocation. 2. Rounded soft tissue expansion along the arm with peripheral calcifications most consistent with history of fistula. Focal saccular possible dilation spans approximately 4.2 cm radiographically. Recommend correlation with physical exam. Correlation with prior fistulograms or prior ultrasound could be of assistance in determining whether possible dilation is acute. Electronically Signed   By: Corean Salter M.D.   On: 07/15/2024 14:41     Procedures   Medications Ordered in the ED  HYDROcodone -acetaminophen  (NORCO/VICODIN) 5-325 MG per tablet 1 tablet (1 tablet Oral Given 07/15/24 1400)  iohexol  (OMNIPAQUE ) 350 MG/ML injection 100 mL (100 mLs Intravenous Contrast Given 07/15/24 1552)  HYDROcodone -acetaminophen  (NORCO/VICODIN) 5-325 MG per tablet 1 tablet (1 tablet Oral Given 07/15/24 1814)    Clinical Course as of 07/15/24 1900  Tue Jul 15, 2024  1748 Reassessed patient at bedside.  Still admits to pain.  Notes hydrocodone  worked.   Will give another dose of Norco. [CA]    Clinical Course User Index [CA] Lorelle Aleck BROCKS, PA-C                                 Medical Decision Making Amount and/or Complexity of Data Reviewed Labs: ordered. Decision-making details documented in ED Course. Radiology: ordered and independent interpretation performed. Decision-making details documented in ED Course.  Risk Prescription drug management.   This patient presents to the ED for concern of left arm injury, this involves an extensive number of treatment options, and is a complaint that carries with it a high risk of complications and morbidity.  The differential diagnosis includes bicep tendon rupture, fistula complication, bony fracture, dislocation, infection,  etc  64 year old male presents to the ED due to left upper extremity injury.  Patient was walking his dog 3 to 4 days ago when he was yanked and heard a pop in his left upper extremity around previous fistula.  History of ESRD status post renal transplant.  No other injuries.  Upon arrival, stable vitals.  Patient in no acute distress.  Patient has significant tenderness surrounding old fistula.  Patient able to flex left elbow to 45 degrees.  Full extension.  Unclear whether or not this is a bicep tendon rupture versus issue with previous fistula.  Will start with x-ray however, may need further imaging.  Discussed with ultrasound who does not perform fistulogram's here.  Routine labs ordered.  CBC with no leukocytosis.  Normal hemoglobin.  BMP with elevated creatinine 1.35 GFR 58.  No major electrolyte derangements.  X-ray personally reviewed and interpreted which is negative for any acute fracture or dislocation.  Does demonstrate rounded soft tissue expansion along the arm with peripheral calcifications most consistent with history of fistula.  Also demonstrates a focal saccular dilation. CTA upper extremity ordered.   CTA negative for bony abnormality or soft tissue  collection of the left upper extremity.  Does demonstrate large torturous vascular structure in the anterior upper arm soft tissue with peripheral calcification. Possible graft occlusion.   6:48 PM Discussed with Dr Serene with vascular who reviewed picture and CT images. Does not feel issue is a vascular issue. 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 Recommends discharging patient with antibiotic to cover for possible cellulitis given erythema. Recommends orthopedics follow-up.   Patient discharged with doxycycline  and Keflex  to cover for possible cellulitis given immunocompromise state.  Orthopedics number given to patient at discharge and advised to call to schedule an appointment for further evaluation.  Patient discharged with short course of pain medication.  Patient stable for discharge. Strict ED precautions discussed with patient. Patient states understanding and agrees to plan. Patient discharged home in no acute distress and stable vitals.  Discussed with Dr. Gennaro who evaluated patient at bedside and agrees with assessment and plan.   Co morbidities that complicate the patient evaluation  hyperlipidemia   Social Determinants of Health:  Elderly 33  Test / Admission - Considered:  Considered admission however, given reassuring CTA left upper extremity feel patient is stable for outpatient follow-up with orthopedics to rule out tendon injury.     Final diagnoses:  Injury of left upper arm, initial encounter    ED Discharge Orders          Ordered    HYDROcodone -acetaminophen  (NORCO/VICODIN) 5-325 MG tablet  Every 6 hours PRN        07/15/24 1857    doxycycline  (VIBRAMYCIN ) 100 MG capsule  2 times daily        07/15/24 1857    cephALEXin  (KEFLEX ) 500 MG capsule  3 times daily        07/15/24 1857               Kateryn Marasigan C, PA-C 07/15/24 1900    Kammerer, Megan L, DO 07/19/24 1439  "

## 2024-07-15 NOTE — Discharge Instructions (Addendum)
 It was a pleasure taking care of you today.  As discussed, I am sending you home with 2 different antibiotics.  Take as prescribed and finish all antibiotics. I am also sending you home with pain medication.  Save for severe pain.  Pain medication can cause drowsiness so do not drive or operate machinery while on the medication.  I included the number the orthopedic surgeon.  Call to schedule an appointment for further evaluation.  Return to the ER for any worsening symptoms.

## 2024-07-15 NOTE — ED Triage Notes (Signed)
 Pt reports 3-4 days ago he was walking his dog when the dog pulled the leash that he was holding with his left arm and he felt a pop in his L upper arm just above where his old dialysis fistula is. Pt also reports L upper arm has been getting more red and swollen since.

## 2024-09-01 ENCOUNTER — Ambulatory Visit: Payer: Medicare (Managed Care) | Admitting: Orthopedic Surgery
